# Patient Record
Sex: Female | Born: 1945 | Race: White | Hispanic: No | Marital: Married | Smoking: Never smoker
Health system: Southern US, Community
[De-identification: ages and names within clinical notes are randomized; demographics above are authoritative.]

## PROBLEM LIST (undated history)

## (undated) DIAGNOSIS — I517 Cardiomegaly: Secondary | ICD-10-CM

## (undated) DIAGNOSIS — Z9889 Other specified postprocedural states: Secondary | ICD-10-CM

## (undated) DIAGNOSIS — R252 Cramp and spasm: Secondary | ICD-10-CM

## (undated) DIAGNOSIS — K8020 Calculus of gallbladder without cholecystitis without obstruction: Secondary | ICD-10-CM

## (undated) DIAGNOSIS — F419 Anxiety disorder, unspecified: Secondary | ICD-10-CM

## (undated) DIAGNOSIS — K219 Gastro-esophageal reflux disease without esophagitis: Secondary | ICD-10-CM

## (undated) DIAGNOSIS — E785 Hyperlipidemia, unspecified: Secondary | ICD-10-CM

## (undated) DIAGNOSIS — R29898 Other symptoms and signs involving the musculoskeletal system: Secondary | ICD-10-CM

## (undated) DIAGNOSIS — Z96659 Presence of unspecified artificial knee joint: Secondary | ICD-10-CM

## (undated) DIAGNOSIS — M1990 Unspecified osteoarthritis, unspecified site: Secondary | ICD-10-CM

## (undated) DIAGNOSIS — K449 Diaphragmatic hernia without obstruction or gangrene: Secondary | ICD-10-CM

## (undated) DIAGNOSIS — G8929 Other chronic pain: Secondary | ICD-10-CM

## (undated) DIAGNOSIS — M542 Cervicalgia: Secondary | ICD-10-CM

## (undated) DIAGNOSIS — H269 Unspecified cataract: Secondary | ICD-10-CM

## (undated) DIAGNOSIS — T07XXXA Unspecified multiple injuries, initial encounter: Secondary | ICD-10-CM

## (undated) DIAGNOSIS — I8390 Asymptomatic varicose veins of unspecified lower extremity: Secondary | ICD-10-CM

## (undated) DIAGNOSIS — M797 Fibromyalgia: Secondary | ICD-10-CM

## (undated) DIAGNOSIS — I10 Essential (primary) hypertension: Secondary | ICD-10-CM

## (undated) DIAGNOSIS — R739 Hyperglycemia, unspecified: Secondary | ICD-10-CM

## (undated) HISTORY — DX: Essential (primary) hypertension: I10

## (undated) HISTORY — DX: Cramp and spasm: R25.2

## (undated) HISTORY — DX: Cervicalgia: M54.2

## (undated) HISTORY — DX: Calculus of gallbladder without cholecystitis without obstruction: K80.20

## (undated) HISTORY — DX: Hyperlipidemia, unspecified: E78.5

## (undated) HISTORY — DX: Cardiomegaly: I51.7

## (undated) HISTORY — DX: Hyperglycemia, unspecified: R73.9

## (undated) HISTORY — PX: TONSILLECTOMY: SUR1361

## (undated) HISTORY — DX: Other specified postprocedural states: Z98.890

## (undated) HISTORY — DX: Gastro-esophageal reflux disease without esophagitis: K21.9

## (undated) HISTORY — DX: Fibromyalgia: M79.7

## (undated) HISTORY — DX: Presence of unspecified artificial knee joint: Z96.659

## (undated) HISTORY — DX: Diaphragmatic hernia without obstruction or gangrene: K44.9

## (undated) HISTORY — PX: SIGMOIDOSCOPY: SUR1295

## (undated) HISTORY — PX: TOTAL KNEE ARTHROPLASTY: SHX125

## (undated) HISTORY — DX: Other chronic pain: G89.29

---
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
## 1975-09-20 HISTORY — PX: ABDOMINAL HYSTERECTOMY: SHX81

## 1997-04-08 ENCOUNTER — Encounter: Payer: Self-pay | Admitting: Internal Medicine

## 1999-11-24 ENCOUNTER — Encounter: Admission: RE | Admit: 1999-11-24 | Discharge: 1999-11-24 | Payer: Self-pay | Admitting: Internal Medicine

## 1999-11-24 ENCOUNTER — Encounter: Payer: Self-pay | Admitting: Internal Medicine

## 2003-04-16 ENCOUNTER — Other Ambulatory Visit: Admission: RE | Admit: 2003-04-16 | Discharge: 2003-04-16 | Payer: Self-pay | Admitting: Obstetrics and Gynecology

## 2004-07-30 ENCOUNTER — Ambulatory Visit: Payer: Self-pay | Admitting: Internal Medicine

## 2004-09-19 HISTORY — PX: OTHER SURGICAL HISTORY: SHX169

## 2005-03-02 ENCOUNTER — Ambulatory Visit: Payer: Self-pay | Admitting: Internal Medicine

## 2005-03-08 ENCOUNTER — Encounter: Admission: RE | Admit: 2005-03-08 | Discharge: 2005-03-08 | Payer: Self-pay | Admitting: Internal Medicine

## 2005-04-28 ENCOUNTER — Inpatient Hospital Stay (HOSPITAL_COMMUNITY): Admission: RE | Admit: 2005-04-28 | Discharge: 2005-04-29 | Payer: Self-pay | Admitting: Obstetrics and Gynecology

## 2005-10-12 ENCOUNTER — Other Ambulatory Visit: Admission: RE | Admit: 2005-10-12 | Discharge: 2005-10-12 | Payer: Self-pay | Admitting: Obstetrics and Gynecology

## 2006-04-19 HISTORY — PX: CYSTOSTOMY W/ BLADDER BIOPSY: SHX1431

## 2006-05-18 ENCOUNTER — Ambulatory Visit (HOSPITAL_BASED_OUTPATIENT_CLINIC_OR_DEPARTMENT_OTHER): Admission: RE | Admit: 2006-05-18 | Discharge: 2006-05-18 | Payer: Self-pay | Admitting: Urology

## 2006-05-18 ENCOUNTER — Encounter (INDEPENDENT_AMBULATORY_CARE_PROVIDER_SITE_OTHER): Payer: Self-pay | Admitting: Specialist

## 2006-05-25 ENCOUNTER — Ambulatory Visit: Payer: Self-pay | Admitting: Internal Medicine

## 2006-05-25 ENCOUNTER — Inpatient Hospital Stay (HOSPITAL_COMMUNITY): Admission: AD | Admit: 2006-05-25 | Discharge: 2006-05-28 | Payer: Self-pay | Admitting: Internal Medicine

## 2006-06-05 ENCOUNTER — Ambulatory Visit: Payer: Self-pay | Admitting: Internal Medicine

## 2006-07-03 ENCOUNTER — Ambulatory Visit: Payer: Self-pay | Admitting: Internal Medicine

## 2006-11-29 ENCOUNTER — Ambulatory Visit: Payer: Self-pay | Admitting: Internal Medicine

## 2007-05-30 ENCOUNTER — Encounter (INDEPENDENT_AMBULATORY_CARE_PROVIDER_SITE_OTHER): Payer: Self-pay | Admitting: *Deleted

## 2007-07-18 ENCOUNTER — Ambulatory Visit: Payer: Self-pay | Admitting: Internal Medicine

## 2007-08-02 ENCOUNTER — Encounter: Payer: Self-pay | Admitting: Internal Medicine

## 2007-11-16 ENCOUNTER — Encounter: Payer: Self-pay | Admitting: Internal Medicine

## 2007-12-11 ENCOUNTER — Telehealth (INDEPENDENT_AMBULATORY_CARE_PROVIDER_SITE_OTHER): Payer: Self-pay | Admitting: *Deleted

## 2007-12-11 ENCOUNTER — Ambulatory Visit: Payer: Self-pay | Admitting: Internal Medicine

## 2007-12-11 ENCOUNTER — Observation Stay (HOSPITAL_COMMUNITY): Admission: EM | Admit: 2007-12-11 | Discharge: 2007-12-12 | Payer: Self-pay | Admitting: Emergency Medicine

## 2007-12-21 ENCOUNTER — Ambulatory Visit: Payer: Self-pay | Admitting: Internal Medicine

## 2007-12-21 DIAGNOSIS — I959 Hypotension, unspecified: Secondary | ICD-10-CM | POA: Insufficient documentation

## 2007-12-21 DIAGNOSIS — R413 Other amnesia: Secondary | ICD-10-CM

## 2007-12-21 DIAGNOSIS — I10 Essential (primary) hypertension: Secondary | ICD-10-CM | POA: Insufficient documentation

## 2007-12-21 DIAGNOSIS — K219 Gastro-esophageal reflux disease without esophagitis: Secondary | ICD-10-CM

## 2007-12-21 DIAGNOSIS — K5909 Other constipation: Secondary | ICD-10-CM

## 2007-12-21 DIAGNOSIS — M797 Fibromyalgia: Secondary | ICD-10-CM

## 2007-12-21 DIAGNOSIS — E785 Hyperlipidemia, unspecified: Secondary | ICD-10-CM | POA: Insufficient documentation

## 2007-12-21 DIAGNOSIS — K449 Diaphragmatic hernia without obstruction or gangrene: Secondary | ICD-10-CM

## 2007-12-25 ENCOUNTER — Encounter: Payer: Self-pay | Admitting: Internal Medicine

## 2008-02-18 LAB — HM COLONOSCOPY

## 2008-03-05 DIAGNOSIS — N816 Rectocele: Secondary | ICD-10-CM | POA: Insufficient documentation

## 2008-03-05 DIAGNOSIS — R197 Diarrhea, unspecified: Secondary | ICD-10-CM

## 2008-03-05 DIAGNOSIS — M8580 Other specified disorders of bone density and structure, unspecified site: Secondary | ICD-10-CM

## 2008-03-06 ENCOUNTER — Ambulatory Visit: Payer: Self-pay | Admitting: Internal Medicine

## 2008-03-14 ENCOUNTER — Encounter: Payer: Self-pay | Admitting: Internal Medicine

## 2008-03-18 ENCOUNTER — Ambulatory Visit: Payer: Self-pay | Admitting: Internal Medicine

## 2008-06-06 ENCOUNTER — Encounter: Payer: Self-pay | Admitting: Internal Medicine

## 2008-06-18 ENCOUNTER — Ambulatory Visit: Payer: Self-pay | Admitting: Internal Medicine

## 2008-06-25 ENCOUNTER — Ambulatory Visit: Payer: Self-pay | Admitting: Internal Medicine

## 2008-10-10 ENCOUNTER — Encounter: Payer: Self-pay | Admitting: Internal Medicine

## 2008-12-08 ENCOUNTER — Ambulatory Visit: Payer: Self-pay | Admitting: Internal Medicine

## 2009-03-26 ENCOUNTER — Ambulatory Visit: Payer: Self-pay | Admitting: Internal Medicine

## 2009-03-26 DIAGNOSIS — J069 Acute upper respiratory infection, unspecified: Secondary | ICD-10-CM | POA: Insufficient documentation

## 2009-03-31 ENCOUNTER — Telehealth (INDEPENDENT_AMBULATORY_CARE_PROVIDER_SITE_OTHER): Payer: Self-pay | Admitting: *Deleted

## 2009-04-17 ENCOUNTER — Encounter: Payer: Self-pay | Admitting: Internal Medicine

## 2009-05-15 ENCOUNTER — Encounter: Payer: Self-pay | Admitting: Internal Medicine

## 2009-06-01 ENCOUNTER — Encounter: Payer: Self-pay | Admitting: Internal Medicine

## 2009-07-17 ENCOUNTER — Encounter: Payer: Self-pay | Admitting: Internal Medicine

## 2009-09-10 ENCOUNTER — Ambulatory Visit: Payer: Self-pay | Admitting: Internal Medicine

## 2009-09-10 ENCOUNTER — Telehealth: Payer: Self-pay | Admitting: Internal Medicine

## 2009-09-10 DIAGNOSIS — N390 Urinary tract infection, site not specified: Secondary | ICD-10-CM

## 2009-09-10 LAB — CONVERTED CEMR LAB
Bilirubin Urine: NEGATIVE
Glucose, Urine, Semiquant: NEGATIVE
Protein, U semiquant: 30
Urobilinogen, UA: 0.2
pH: 6

## 2009-09-15 ENCOUNTER — Encounter (INDEPENDENT_AMBULATORY_CARE_PROVIDER_SITE_OTHER): Payer: Self-pay | Admitting: *Deleted

## 2009-09-19 HISTORY — PX: COLONOSCOPY: SHX174

## 2009-11-06 ENCOUNTER — Encounter: Payer: Self-pay | Admitting: Internal Medicine

## 2010-03-05 ENCOUNTER — Encounter: Payer: Self-pay | Admitting: Internal Medicine

## 2010-07-16 ENCOUNTER — Encounter: Payer: Self-pay | Admitting: Internal Medicine

## 2010-10-19 NOTE — Letter (Signed)
Summary: External Correspondence/[facility]  External Correspondence/[facility]   Imported By: Freddy Jaksch 06/07/2007 10:55:14  _____________________________________________________________________  External Attachment:    Type:   Image     Comment:   External Document

## 2010-10-19 NOTE — Letter (Signed)
Summary: [facility]  [facility]   Imported By: Lanelle Bal 12/10/2009 12:20:30  _____________________________________________________________________  External Attachment:    Type:   Image     Comment:   External Document

## 2010-10-19 NOTE — Consult Note (Signed)
Summary: [facility]  [facility]   Imported By: Lanelle Bal 03/23/2010 08:23:48  _____________________________________________________________________  External Attachment:    Type:   Image     Comment:   External Document

## 2010-10-22 NOTE — Consult Note (Signed)
Summary: [facility]  [facility]   Imported By: Lanelle Bal 08/10/2010 09:33:37  _____________________________________________________________________  External Attachment:    Type:   Image     Comment:   External Document

## 2010-11-12 ENCOUNTER — Encounter: Payer: Self-pay | Admitting: Internal Medicine

## 2010-11-30 NOTE — Letter (Signed)
Summary: [facility]  [facility]   Imported By: Maryln Gottron 11/23/2010 15:51:02  _____________________________________________________________________  External Attachment:    Type:   Image     Comment:   External Document

## 2011-02-01 NOTE — Discharge Summary (Signed)
NAMEFLORIDA, NOLTON               ACCOUNT NO.:  [id number]   MEDICAL RECORD NO.:  [id number]          PATIENT TYPE:  INP   LOCATION:  5526                         FACILITY:  [facility]   PHYSICIAN:  Valerie A. Felicity Coyer, MDDATE OF BIRTH:  01-05-1946   DATE OF ADMISSION:  12/11/2007  DATE OF DISCHARGE:  12/12/2007                               DISCHARGE SUMMARY   DISCHARGE DIAGNOSES:  1. Diarrhea secondary to acute viral gastroenteritis versus      Clostridium difficile colitis.  2. Syncope with fall in the setting of dehydration and weakness      secondary to diarrhea.  3. Fibromyalgia.  4. Gastroesophageal reflux disease.  5. Hypertension with low blood pressure on admission.  6. Rule out urinary tract infection.  7. Hyperlipidemia.  8. Osteoporosis.   HISTORY OF PRESENT ILLNESS:  Ms. Osmundson is a 65 year old female  admitted on December 11, 2007, with a chief complaint of diarrhea and  multiple falls on the morning of this admission.  She noted that her  diarrhea started at 6 a.m. and upon standing in the bathroom, she had a  syncopal episode and thinks that she hit her head.  She denied any  nausea, vomiting, or abdominal pain.  She noted a total of 5 syncopal  episodes, none of which were witnessed, but did note that she struck her  head, that she had some bruising on her forehead and epistaxis.  These  falls occurred while she was trying to get out of bed to make it to the  bathroom secondary to diarrhea.  She was brought to the emergency  department by her daughter-in-law.  The patient also noted that she  recently underwent oral surgery on November 23, 2007, and was treated with a  7-day course of amoxicillin following that procedure.  She was admitted  for further evaluation and treatment.   PAST MEDICAL HISTORY:  1. Fibromyalgia.  2. GERD.  3. Hypertension.  4. Osteoporosis.  5. Hyperlipidemia.  6. History of bladder sling/prolapse repair in 2006.  7. Bladder biopsy 2007.  8. Oral surgery November 23, 2007, for tooth extraction of 2 upper molars      by Dr. Gwendlyn Deutscher.   COURSE OF HOSPITALIZATION:  1. Diarrhea, secondary to acute viral gastroenteritis versus C. diff      colitis.  The patient was noted to have a white count of 23,000      upon admission.  She was started empirically on Flagyl in the      setting of recent antibiotic use.  She has had no further stools      since coming to the emergency room yesterday, and therefore we have      been unable to send stool for C. diff toxin.  Her white count today      is 11.8, and she is feeling much better.  She is tolerating p.o.      Her blood pressure is stable at 128/83.  She has been ambulating in      the room without difficulty.  We will ask the RN to assist the  patient with ambulation in the hall and if stable, plan for      discharge later today.  We will continue empiric Flagyl for a total      of 7 days.  2. Syncope with fall.  The patient has had no further lightheadedness      or weakness noted.  CT of the head was performed on admission which      was negative.  CT of the maxillofacial sinus noted a retention cyst      at the right base of the maxillary sinus but no acute findings.      Thoracic spine x-ray was negative and chest x-ray was negative.      She also had point-of-care cardiac enzymes performed on admission      which were negative.  EKG on admission noted normal sinus rhythm,      and her rate has been stable.  The patient was given IV hydration,      and her blood pressure is currently stable.  3. Rule out urinary tract infection.  The patient was noted to have a      mildly positive urinalysis.  She has no urinary symptoms.  We will      send the urine for culture.  She will need followup appointment      with Dr. Alwyn Ren.   DISCHARGE MEDICATIONS:  1. Flagyl 500 mg p.o. q.8 h. x7 days.  2. Nexium 40 mg p.o. daily.  3. Mobic 7.5 mg p.o. daily.  4. Tramadol 50 mg 2 tabs  p.o. as needed.  5. Cymbalta 20 mg p.o. b.i.d.  6. Centrum Silver once daily.  7. Enteric-coated aspirin 81 mg p.o. daily.  8. Zanaflex 4 mg at bedtime.  9. Diltiazem 180 mg p.o. daily.  10.Os-Cal 1200 mg p.o. daily.  11.Vitamin D as before.  12.Lyrica 75 mg p.o. daily in the evening.  13.Pravachol 40 mg p.o. daily.  14.Hydrocortone/APAP 5/500 one tab p.o. q.4 h. as needed.  15.Flexeril 20 mg at bedtime as before.  16.Fosinopril 10 mg p.o. daily.   PERTINENT LABORATORY:  At the time of discharge, white blood cell count  11.8, hemoglobin 11.7, hematocrit 34, platelets 210, BUN 14, creatinine  0.82.   DISPOSITION:  The patient will be discharged to home.   FOLLOWUP:  The patient is scheduled to follow up with Dr. Marga Melnick  on Wednesday December 19, 2007, at 10 a.m.  She will need followup of urine  culture results with this appointment.      Sandford Craze, NP      Raenette Rover. Felicity Coyer, MD  Electronically Signed    MO/MEDQ  D:  12/12/2007  T:  12/13/2007  Job:  [id number]   cc:   Titus Dubin. Alwyn Ren, MD,FACP,FCCP

## 2011-02-04 NOTE — Op Note (Signed)
Madeline [name], Madeline [name]               ACCOUNT NO.:  [id number]   MEDICAL RECORD NO.:  [id number]          PATIENT TYPE:  AMB   LOCATION:  [facility]                         FACILITY:  [facility]   PHYSICIAN:  Excell Seltzer. Annabell Howells, M.D.    DATE OF BIRTH:  March 20, 1946   DATE OF PROCEDURE:  05/18/2006  DATE OF DISCHARGE:                                 OPERATIVE REPORT   PROCEDURE:  Cystoscopy, bladder biopsy, fulguration.   PREOPERATIVE DIAGNOSIS:  Bladder wall lesion.   POSTOPERATIVE DIAGNOSIS:  Bladder wall lesion.   SURGEON:  Excell Seltzer. Annabell Howells, M.D.   ANESTHESIA:  General.   SPECIMEN:  Bladder biopsies from the right lateral wall, posterior wall and  dome.   COMPLICATIONS:  None.   INDICATIONS:  [name] is a 65 year old white female who is status post  AP repair and sling in August of last year. She has had some increased  achiness in the suprapubic area with a sensation of incomplete emptying over  the last few months, she has had increased urgency with frequent small voids  more recently. Urinalysis revealed microhematuria.  Office cystoscopy  revealed some increased vascularity with velvety lesions on the posterior  wall suspicious for inflammation versus carcinoma in situ.  It was felt the  biopsy was indicated.   FINDINGS AND PROCEDURE:  The patient was taken operating room. After  receiving Cipro, a general anesthetic was induced.  She was placed in  lithotomy position.  Her perineum and genitalia were prepped with Betadine  solution.  She was draped in the usual sterile fashion.  Cystoscopy was  performed using a 22-French scope and 12 and 70 degree lenses.  Examination  revealed a normal urethra.  The bladder wall was pale and smooth with some  slightly pinkish tan areas on the posterior wall, it was not nearly as  impressive as it had been in the office, but there were areas  distinguishable difference from the normal mucosa.  The ureteral orifices  were unremarkable.   Cup biopsies  were obtained from the areas of concern in the right lateral  wall, posterior wall, and dome of the bladder.  The biopsy sites were  fulgurated with Bugbee electrode.  Once hemostasis was assured, the bladder  was drained.  The patient was taken down from the lithotomy position.  Her  anesthetic was reversed.  She was admitted to the recovery room in stable  condition.  There were no complications.      Excell Seltzer. Annabell Howells, M.D.  Electronically Signed     JJW/MEDQ  D:  05/18/2006  T:  05/18/2006  Job:  [id number]   cc:   Marcelino Duster L. Vincente Poli, M.D.  Fax: [phone]

## 2011-02-04 NOTE — Discharge Summary (Signed)
Madeline [name], Madeline [name]               ACCOUNT NO.:  [id number]   MEDICAL RECORD NO.:  [id number]            PATIENT TYPE:   LOCATION:                                 FACILITY:   PHYSICIAN:  Bruce Rexene Edison. Swords, MD         DATE OF BIRTH:   DATE OF ADMISSION:  05/25/2006  DATE OF DISCHARGE:  05/28/2006                                 DISCHARGE SUMMARY   DISCHARGE DIAGNOSES:  1. Fever.  2. Rash.  3. Urinary tract infection.  4. Fibromyalgia followed by Dr. Concepcion Living at [facility].  5. Gastroesophageal reflux disease.  6. Hypertension.  7. Hyperlipidemia.  8. Osteoporosis.  9. Recent cystoscopy with bladder biopsy (May 18, 2006).   DISCHARGE MEDICATIONS:  1. Ceftin 250 mg p.o. b.i.d. for 5 days.  2. Prednisone 20 mg 2 tablets daily for 2 days then 1 tablet daily for 2      days, then 1/2 tablet daily for 2 days.  3. Nexium 40 mg p.o. daily.  4. Mobic 7.5 mg p.r.n.  5. Tramadol 150 mg p.o. b.i.d.  6. Cymbalta 20 mg b.i.d.  7. Flexeril 10 mg p.o. p.r.n.  8. Diltiazem XR 180 mg p.o. daily.  9. Lisinopril 10 mg p.o. daily.  10.Zanaflex 4 mg p.o. q.h.s.  11.Fosamax 70 mg weekly.  12.Os-Cal 1 p.o. b.i.d.  13.Pravachol 40 mg p.o. daily.  14.Vicodin p.r.n.  15.Pyridium p.r.n. (not taking since cystoscopy).   ALLERGIES:  IVP DYE.   HOSPITAL LABORATORIES:  CMET, at the time of discharge, potassium 3.3,  glucose 136, albumin 2.9, calcium 8.2.  CBC white count 14.3 (on steroids).  Hemoglobin 10.7, group A strep negative.  Urine culture significant for  greater than 100,000 colonies E. coli sensitive to cephalosporins.  Group A  strep negative.  Blood cultures negative to date.   HOSPITAL CONSULTS:  Infectious disease and urology.   HOSPITAL PROCEDURES:  None.   CONDITION ON DISCHARGE:  Improved.   FOLLOWUP PLANS:  1. Dr. Elijah Birk as scheduled for 5 days from now.  2. Dr. Alwyn Ren in 1-2 weeks.   HOSPITAL COURSE:  The patient presented to the hospital  with fever and rash  on 05/25/2006.  Etiology unclear.  The patient was seen in consultation by  urology.  Urine culture was pending.  Did not grow anything until results  were presented on May 28, 2006 with greater than 100,000 colonies.  She  was empirically treated with antibiotics including ceftriaxone.  Antibiotics  will be changed to Ceftin as an outpatient.  She will followup with Dr.  Alwyn Ren.   Rash--I suspect that the rash was an idiopathic response related to the  urinary tract infection/pyelonephritis.  Rash resolved.  It did require a  dose of methylprednisolone and I will continue prednisone taper as an  outpatient.  The patient has been afebrile for greater than 24 hours.   Other medical problems are stable; and outlined as above.  She has followup  of fibromyalgia in 5 days with Dr. Concepcion Living.  Bruce Rexene Edison Swords, MD  Electronically Signed     BHS/MEDQ  D:  05/28/2006  T:  05/28/2006  Job:  [id number]   cc:   FAX #: [phone] Dr. Bing Neighbors. Alwyn Ren, MD,FACP,FCCP

## 2011-02-04 NOTE — Op Note (Signed)
[name], Madeline [name]               ACCOUNT NO.:  [id number]   MEDICAL RECORD NO.:  [id number]          PATIENT TYPE:  INP   LOCATION:  9310                          FACILITY:  [facility]   PHYSICIAN:  Michelle L. Grewal, M.D.DATE OF BIRTH:  [birth year]   DATE OF PROCEDURE:  04/28/2005  DATE OF DISCHARGE:                                 OPERATIVE REPORT   PREOPERATIVE DIAGNOSES:  1.  Pelvic pain.  2.  Cystocele.  3.  Rectocele.   POSTOPERATIVE DIAGNOSES:  1.  Pelvic pain.  2.  Cystocele.  3.  Rectocele.   PROCEDURES:  1.  Diagnostic laparoscopy.  2.  Anterior and posterior repair.   SURGEON:  Michelle L. Vincente Poli, M.D.   ASSISTANT:  Excell Seltzer. Annabell Howells, M.D.   ANESTHESIA:  General.   SPECIMENS:  None.   ESTIMATED BLOOD LOSS:  100 mL.   COMPLICATIONS:  None.   PROCEDURE:  The patient is taken to the operating room.  She is intubated  without difficulty.  She is then prepped and draped in the usual sterile  fashion.  An in-and-out catheter is used to empty the bladder.  Exam under  anesthesia revealed a grade 2-3 cystocele and a grade 2 rectocele.  The  attention was turned now abdominally, and an infraumbilical incision was  made with a scalpel, a Veress needle was inserted, and pneumoperitoneum was  achieved.  The patient was placed in Trendelenburg position and then with  excellent visualization of the pelvis and with a sponge stick in the vagina,  there were no adhesions noted in the pelvic floor and ovaries appeared to be  normal, and there were no adhesions or any evidence of endometriosis.  The  bowel surfaces appeared normal, too.  We then removed the laparoscope and  released as much pneumoperitoneum as possible and then closed the incision  using Dermabond.  At this point we then returned vaginally.  The weighted  speculum was placed in the vagina.  Allis clamps were placed in each vaginal  apex and a transverse incision was made with a scalpel, and a midline  incision was  made beneath the cystocele.  We then reduced the cystocele with  sharp and blunt dissection and I closed it with a pursestring suture using 0  Vicryl suture and then underneath that placed  series of interrupteds using  0 Vicryl suture.  The redundant vaginal epithelium was closed and that  incision was closed in a continuous running locked stitch using a 2-0 Vicryl  suture.  At this point Dr. Annabell Howells and I switched and Dr. Annabell Howells performed his  transobturator sling without incident.  After he closed that incision, I  then returned back as the primary surgeon and placed Allis clamps at the 5  and 7 o'clock position in the perineum, made a V-shaped incision of the  perineal body, and then made a midline incision all the way up the posterior  wall of the vagina and reduced the rectocele using sharp and blunt  dissection.  We then placed interrupteds using 0 Vicryl suture, moving  distally to proximal.  I  trimmed off the redundant vaginal epithelium and  then closed that using 2-0 Vicryl in continuous running locked stitch.  A  packing with Estrace cream was inserted into the vagina.  All sponge, lap  and instrument counts were correct x2.  The patient tolerated the procedure  well and went to the recovery room in stable condition.       MLG/MEDQ  D:  04/28/2005  T:  04/28/2005  Job:  [id number]   cc:   Excell Seltzer. Annabell Howells, M.D.  [address]  [place]  [place] [address]  Fax: [phone]

## 2011-02-04 NOTE — Op Note (Signed)
Madeline [name], Madeline [name]               ACCOUNT NO.:  [id number]   MEDICAL RECORD NO.:  [id number]          PATIENT TYPE:  INP   LOCATION:  9310                          FACILITY[facility]  [facility]   PHYSICIAN:  Excell Seltzer. Annabell Howells, M.D.    DATE OF BIRTH:  03-16-1946   DATE OF PROCEDURE:  04/27/2005  DATE OF DISCHARGE:                                 OPERATIVE REPORT   PROCEDURE:  Monarc transobturator sling.   PREOPERATIVE DIAGNOSIS:  Stress incontinence.   POSTOPERATIVE DIAGNOSIS:  Stress incontinence.   SURGEON:  Excell Seltzer. Annabell Howells, M.D.   ASSISTANT:  Stann Mainland. Vincente Poli, M.D.   ANESTHESIA:  General.   DRAINS:  Foley catheter.   COMPLICATIONS:  None.   INDICATIONS:  [name] is a 65 year old white female with a cystocele  and rectocele, who is to undergo laparoscopy followed by anterior-posterior  repair.  She has stress incontinence and is to undergo a transobturator  sling.  I elected that approach because of some chronic suprapubic pain.   FINDINGS AND PROCEDURE:  The patient had been placed under a general  anesthetic, prepped and placed in the lithotomy position and draped in the  usual sterile fashion.  Dr. Vincente Poli had completed the laparoscopy and  anterior repair.   I then infiltrated the anterior vaginal wall over the midurethra with  approximately 4 mL of 1% lidocaine with epinephrine.  A midline incision was  made.  The vaginal mucosa was elevated off the pubourethral fascia on each  side, allowing placement of a fingertip into the incision.  Incision sites  were then marked 5 cm lateral to the clitoris on each side.  The location of  the adductor longus tendon was identified and the incision was placed well  below this to avoid involvement.  Both incisions were then made  approximately a centimeter in length and the fat was spread slightly with a  hemostat.  A C needle was then passed on the left without difficulty into  the vaginal area and then out through the midurethral  incision.  Inspection  revealed no evidence of perforation of the vaginal vault.  The right needle  was then passed in identical fashion.   Once both needles were passed, cystoscopy was performed using a 21 [place]  scope and 70 degree lens.  Examination revealed no evidence of bladder wall  or urethral injury.   The Monarc mesh was then secured to the needles and moved into position.  The tension of the mesh was adjusted and the sheaths were removed.   The Foley catheter was replaced and the tension of the mesh was once again  adjusted.  Once it was felt to be appropriate, the vaginal wall was closed  with a running locked 2-0 Vicryl suture.  The ends of the mesh were then  trimmed from the perilabral incisions and the perilabral incisions were  cleaned, dried and closed with Dermabond.   At this point Dr. Vincente Poli performed the rectocele.  The Foley catheter was  left to straight drainage and a vaginal pack was placed at the end of the  procedure.  There were no complications during my portion of the operation.      Excell Seltzer. Annabell Howells, M.D.  Electronically Signed     JJW/MEDQ  D:  04/28/2005  T:  04/28/2005  Job:  [id number]   cc:   Marcelino Duster L. Vincente Poli, M.D.  [address] [place]  [place] [address]  Fax: [phone]

## 2011-06-13 LAB — URINE CULTURE: Colony Count: 15000

## 2011-06-13 LAB — COMPREHENSIVE METABOLIC PANEL
ALT: 17
AST: 32
Albumin: 4.2
Alkaline Phosphatase: 72
BUN: 21
CO2: 25
Calcium: 9.8
Chloride: 104
Creatinine, Ser: 1.05
GFR calc Af Amer: >60
GFR calc non Af Amer: 53 — ABNORMAL LOW
Glucose, Bld: 114 — ABNORMAL HIGH
Potassium: 4
Sodium: 141
Total Bilirubin: 0.7
Total Protein: 7.5

## 2011-06-13 LAB — URINALYSIS, ROUTINE W REFLEX MICROSCOPIC
Glucose, UA: NEGATIVE
Ketones, ur: 15 — AB
Nitrite: NEGATIVE
Protein, ur: NEGATIVE
Specific Gravity, Urine: 1.013
Urobilinogen, UA: 1
pH: 5.5

## 2011-06-13 LAB — URINE MICROSCOPIC-ADD ON

## 2011-06-13 LAB — CBC
HCT: 42.8
Hemoglobin: 14.6
MCHC: 34
MCV: 89
MCV: 89.1
Platelets: 243
RBC: 3.82 — ABNORMAL LOW
RBC: 4.81
RDW: 13.4
WBC: 11.8 — ABNORMAL HIGH
WBC: 23.3 — ABNORMAL HIGH

## 2011-06-13 LAB — POCT CARDIAC MARKERS
Myoglobin, poc: 67.6
Operator id: 285491

## 2011-06-13 LAB — DIFFERENTIAL
Basophils Absolute: 0
Basophils Relative: 0
Eosinophils Absolute: 0
Eosinophils Relative: 0
Lymphocytes Relative: 17
Lymphs Abs: 4
Monocytes Absolute: 1.6 — ABNORMAL HIGH
Monocytes Relative: 7
Neutro Abs: 17.7 — ABNORMAL HIGH
Neutrophils Relative %: 76

## 2011-06-13 LAB — BASIC METABOLIC PANEL
Chloride: 103
Creatinine, Ser: 0.82
GFR calc Af Amer: >60

## 2011-06-30 ENCOUNTER — Encounter: Payer: Self-pay | Admitting: Internal Medicine

## 2011-06-30 ENCOUNTER — Ambulatory Visit (INDEPENDENT_AMBULATORY_CARE_PROVIDER_SITE_OTHER): Payer: Medicare Other | Admitting: Internal Medicine

## 2011-06-30 VITALS — BP 126/84 | HR 103 | Temp 98.4°F | Resp 12 | Ht 67.75 in | Wt 186.8 lb

## 2011-06-30 DIAGNOSIS — E785 Hyperlipidemia, unspecified: Secondary | ICD-10-CM

## 2011-06-30 DIAGNOSIS — Z23 Encounter for immunization: Secondary | ICD-10-CM

## 2011-06-30 DIAGNOSIS — Z0000 Encounter for general adult medical examination without abnormal findings: Secondary | ICD-10-CM

## 2011-06-30 NOTE — Progress Notes (Signed)
Subjective:    Patient ID: Madeline [name], female    DOB: 04-21-1946, 65 y.o.   MRN: [id number]  HPI Pre op evaluation: Surgical diagnosis:end stage Osteoarthritis Tentative surgical date/Surgeon:09/21/2011; Dr Despina Hick Severity: up to 7 ; knife like with ambulation Activity of daily living limitation/impairment of function:using cane for gait stability with  ambulation Treatment to date, efficacy:Mobic; Tramadol; S/P steroid injection X 4 Significant past medical history:  dyslipidemia; hypertension; fibromyalgia; Osteopenia; vitamin D deficiency   Review of Systems HYPERTENSION: Disease Monitoring: Blood pressure range-125/80-85  Chest pain, palpitations- no       Dyspnea-with stairs due to sedentary lifestyle Medications: Compliance- yes  Lightheadedness,Syncope- no    Edema- intermittently  FASTING HYPERGLYCEMIA:FBS 114 in 2009 Polyuria/phagia/dipsia- no       Visual problems- occasional blurring due to cataracts   HYPERLIPIDEMIA: Medications: Compliance- yes  Abd pain, bowel changes- no   Muscle aches- from Fibromyalgia  She has noted some memory issues; this was evaluated by Dr Sandria Manly.          Objective:   Physical Exam Gen.: Healthy and well-nourished in appearance. Alert, appropriate and cooperative throughout exam. Head: Normocephalic without obvious abnormalities  Eyes: No corneal or conjunctival inflammation noted. Pupils equal round reactive to light and accommodation.  Extraocular motion intact.  Ears: External  ear exam reveals no significant lesions or deformities. Canals clear .TMs normal.  Nose: External nasal exam reveals no deformity or inflammation. Nasal mucosa are pink and moist. No lesions or exudates noted.  Mouth: Oral mucosa and oropharynx reveal no lesions or exudates. Teeth in good repair. Neck: No deformities, masses, or tenderness noted. Range of motion slightly decreased. Thyroid normal. Lungs: Normal respiratory effort; chest expands  symmetrically. Lungs are clear to auscultation without rales, wheezes, or increased work of breathing. Heart: Normal rate and rhythm. Normal S1 and S2. No gallop, click, or rub. S 4 w/o murmur. Abdomen: Bowel sounds normal; abdomen soft and nontender. No masses, organomegaly or hernias noted. No AAA                                                                                  Musculoskeletal/extremities: No deformity or scoliosis noted of  the thoracic or lumbar spine. No clubbing, cyanosis, edema noted. Joints ; minimal DIP OA changes. Nail health  good. Valgus deformity of the knees noted. There is marked crepitus of the knees bilaterally, left greater than right. Vascular: Carotid, radial artery, dorsalis pedis and  posterior tibial pulses are full and equal. No bruits present.(Note: Dr. Elmer Picker questioned a carotid bruit; I cannot auscultate such at this time) Neurologic: Alert and oriented x3. Deep tendon reflexes symmetrical and normal.          Skin: Intact without suspicious lesions or rashes. Lymph: No cervical, axillary  lymphadenopathy present. Psych: Mood and affect are normal. Normally interactive  Assessment & Plan:  #1 end-stage degenerative joint disease in the; no contraindications to surgery at this time.   #2 dyslipidemia with strong family history of coronary disease. Clarification of risk is indicated  Plan: See orders and recommendations.

## 2011-06-30 NOTE — Patient Instructions (Signed)
Please  schedule fasting Labs : BMET, hepatic panel, CBC & dif, TSH, & NMR Lipoprofile (see Diagnoses for Codes). Please bring these instructions to that Lab appt.

## 2011-07-01 ENCOUNTER — Other Ambulatory Visit (INDEPENDENT_AMBULATORY_CARE_PROVIDER_SITE_OTHER): Payer: Medicare Other

## 2011-07-01 ENCOUNTER — Other Ambulatory Visit: Payer: Self-pay | Admitting: Internal Medicine

## 2011-07-01 DIAGNOSIS — E785 Hyperlipidemia, unspecified: Secondary | ICD-10-CM

## 2011-07-01 DIAGNOSIS — Z0000 Encounter for general adult medical examination without abnormal findings: Secondary | ICD-10-CM

## 2011-07-01 LAB — CBC WITH DIFFERENTIAL/PLATELET
Eosinophils Relative: 1.4 % (ref 0.0–5.0)
HCT: 37.6 % (ref 36.0–46.0)
Hemoglobin: 12.4 g/dL (ref 12.0–15.0)
Lymphs Abs: 1.9 10*3/uL (ref 0.7–4.0)
Monocytes Relative: 7.3 % (ref 3.0–12.0)
Neutro Abs: 6.2 10*3/uL (ref 1.4–7.7)
RBC: 4.06 Mil/uL (ref 3.87–5.11)
WBC: 8.9 10*3/uL (ref 4.5–10.5)

## 2011-07-01 LAB — HEPATIC FUNCTION PANEL: Albumin: 4.4 g/dL (ref 3.5–5.2)

## 2011-07-01 NOTE — Progress Notes (Signed)
Labs only

## 2011-07-10 ENCOUNTER — Encounter: Payer: Self-pay | Admitting: Internal Medicine

## 2011-07-10 NOTE — Progress Notes (Signed)
  Subjective:    Patient ID: Madeline [name], female    DOB: 07-06-1946, 65 y.o.   MRN: [id number]  HPI    Review of Systems     Objective:   Physical Exam        Assessment & Plan:  Labs from Dr Vincente Poli, [place] [facility] 05/30/11 reviewed:all normal except for minmal elevation of BUN(27). NMR Lipoprofile 07/01/11 reviewed; LDL is 116, goal = < 125. HDL protective @ 71. No medical contraindication to planned surgery by Dr Despina Hick 09/21/2011.

## 2011-08-30 ENCOUNTER — Other Ambulatory Visit: Payer: Self-pay | Admitting: Orthopedic Surgery

## 2011-08-30 NOTE — H&P (Signed)
Madeline [name]  DOB: 07-04-1946  Date of Admission:  09/21/2011  Chief Complaint:  Left Knee Pain  History of Present Illness The patient is a 65 year old female who comes in today for a preoperative History and Physical. The patient is scheduled for a left total knee arthroplasty to be performed by Dr. Gus Rankin. Aluisio, MD at [facility] on 09/21/2011. They have been treated conservatively in the past for the above stated problem and despite conservative measures, they continue to have progressive pain and severe functional limitations and dysfunction. They have failed non-operative management including home exercise, medications, and injections. It is felt that they would benefit from undergoing total joint replacement. Risks and benefits of the procedure have been discussed with the patient and they elect to proceed with surgery. There are no active contraindications to surgery such as ongoing infection or rapidly progressive neurological disease.  Problem List/Past Medical Tinnitus Glaucoma Cataract Chronic Bronchitis Varicose veins Hemorrhoids Urinary Tract Infection Osteopenia Measles Mumps Streptococcus Infections. Strep Throat Fibromyalgia Gastroesophageal Reflux Disease Heart murmur High blood pressure Hypercholesterolemia Migraine Headache Osteoarthritis  Allergies IVP DYE. 11/22/2005  Family History Heart Disease. grandmother mothers side, grandfather mothers side, grandmother fathers side and grandfather fathers side Heart disease in female family member before age 44 Hypertension. father Osteoarthritis. mother, father and grandmother mothers side Osteoporosis. mother Father. Deceased. Age 9,Aneurysm of Aorta and Femoral Art., Histiry of dissection(aortic). Mother. Living. Age 74  Social History Alcohol use. current drinker; drinks wine; only occasionally per week Children. 2 Current work status. retired Financial planner  (Currently). no Drug/Alcohol Rehab (Previously). no Exercise. Exercises never Illicit drug use. no Living situation. live with spouse Marital status. married Number of flights of stairs before winded. 2-3 Pain Contract. no Tobacco / smoke exposure. no Tobacco use. Never smoker. never smoker Post-Surgical Plans. Plans for [facility] Advance Directives. Living Will  Medication History TiZANidine HCl (2MG  Tablet, Oral at bedtime) Active. Pravastatin Sodium (40MG  Tablet, Oral at bedtime) Active. NexIUM (40MG  Capsule DR, Oral every morning) Active. Meloxicam (7.5MG  Tablet, Oral every morning) Active. Lyrica (75MG  Capsule, Oral at bedtime) Active. Fosinopril Sodium (10MG  Tablet, Oral at bedtime) Active. Diltiazem HCl CR (180MG  Capsule ER 24HR, Oral at bedtime) Active. Cymbalta (20MG  Capsule DR Part, Oral two times daily) Active. Cyclobenzaprine HCl (10MG  Tablet, 2 Oral at bedtime) Active. Aspirin EC (81MG  Tablet DR, Oral at bedtime) Active. TraMADol HCl (50MG  Tablet, 1-2 Oral every six hours, as needed) Active. Hydrocodone-Acetaminophen (5-500MG  Tablet, Oral as needed) Active. Benadryl (25MG  Tablet, Oral as needed) Active. (Takes one with Hydrocodone for itching) Centrum Silver ( Oral daily) Active. Vitamin D (Ergocalciferol) (50000UNIT Capsule, Oral every 2 weeks) Active.  Past Surgical History Appendectomy. Date: 70. Dilation and Curettage of Uterus Hysterectomy. Date: 47. complete (non-cancerous) Sinus Surgery Tonsillectomy. Date: 63. A/P Repair and Bladder Sling Surgery. Date: 2003.  Review of Systems General:Present- Memory Loss. Not Present- Chills, Fever, Night Sweats, Appetite Loss, Fatigue, Feeling sick, Weight Gain and Weight Loss. HEENT:Present- Sensitivity to light, Blurred Vision (occassional) and Ringing in the Ears. Not Present- Hearing problems and Nose Bleed. Respiratory:Present- Shortness of breath with exertion. Not Present- Shortness  of breath at rest, Chronic Cough and Wheezing. Cardiovascular:Present- Swelling of Extremities and Leg Cramps. Not Present- Shortness of Breath, Chest Pain and Palpitations. Gastrointestinal:Present- Heartburn and Difficulty Swallowing (sometimes). Not Present- Bloody Stool, Abdominal Pain, Vomiting, Nausea and Incontinence of Stool. Female Genitourinary:Present- Nocturia. Not Present- Blood in Urine, Menstrual Irregularities, Dysuria, Frequency, Hematuria and Incontinence. Musculoskeletal:Present- Joint  Stiffness, Joint Swelling and Joint Pain. Not Present- Muscle Weakness, Muscle Pain and Back Pain. Neurological:Present- Tingling, Numbness and Difficulty with balance (uses cane for balance). Not Present- Burning, Tremor, Headaches and Dizziness. Psychiatric:Present- Memory Loss. Not Present- Anxiety and Depression.  Vitals Weight: 184 lb Height: 67.5 in Body Surface Area: 1.99 m Body Mass Index: 28.39 kg/m Pulse: 88 (Regular) Resp.: 12 (Unlabored) BP: 122/76 (Sitting, Right Arm, Standard)  Physical Exam The physical exam findings are as follows: Patient is a 65 year old white female with continued left knee pain.  General Mental Status - Alert, cooperative and good historian. General Appearance- pleasant. Not in acute distress. Orientation- Oriented X3. Build & Nutrition- Well nourished and Well developed.  Head and Neck Head- normocephalic, atraumatic . Neck Global Assessment- supple. no bruit auscultated on the right and no bruit auscultated on the left.  Eye Pupil- Bilateral- Regular and Round. Motion- Bilateral- EOMI. Wears Glasses  Chest and Lung Exam Auscultation: Breath sounds:- clear at anterior chest wall and - clear at posterior chest wall. Adventitious sounds:- No Adventitious sounds.  Cardiovascular Auscultation:Rhythm- Regular rate and rhythm. Heart Sounds- S1 WNL and S2 WNL. Murmurs & Other Heart Sounds:Auscultation of  the heart reveals - No Murmurs.  Abdomen Palpation/Percussion:Tenderness- Abdomen is non-tender to palpation. Rigidity (guarding)- Abdomen is soft. Auscultation:Auscultation of the abdomen reveals - Bowel sounds normal.  Female Genitourinary Not done, not pertinent to present illness  Musculoskeletal Both hips with normal range of motion, no discomfort. Both knees show slight varus. Left knee range 5 to 120, right knee range about 5 to 125. There is marked crepitus on range of motion of both knees. She is tender medial greater than lateral. There is no instability in either knee. Pulse, sensation, and motor are intact.  RADIOGRAPHS: AP of both knees and lateral showing advanced end stage arthritis of both knees, bone on bone medial and patellofemoral compartment of both knees. The left is a little worse than the right.  Assessment & Plan Osteoarthrosis Left Knee  Patient is for a Left Total Knee Replacement per Dr. Lequita Halt.  Patient wants to look into [facility] after hospital stay.  Avel Peace, PA-C

## 2011-09-06 ENCOUNTER — Encounter (HOSPITAL_COMMUNITY): Payer: Self-pay | Admitting: Pharmacy Technician

## 2011-09-13 DIAGNOSIS — I10 Essential (primary) hypertension: Secondary | ICD-10-CM

## 2011-09-13 DIAGNOSIS — H269 Unspecified cataract: Secondary | ICD-10-CM

## 2011-09-13 DIAGNOSIS — R29898 Other symptoms and signs involving the musculoskeletal system: Secondary | ICD-10-CM

## 2011-09-13 HISTORY — DX: Other symptoms and signs involving the musculoskeletal system: R29.898

## 2011-09-13 HISTORY — DX: Unspecified cataract: H26.9

## 2011-09-13 HISTORY — DX: Essential (primary) hypertension: I10

## 2011-09-14 ENCOUNTER — Ambulatory Visit (HOSPITAL_COMMUNITY)
Admission: RE | Admit: 2011-09-14 | Discharge: 2011-09-14 | Disposition: A | Discharge status: Home / Self Care | Payer: Medicare Other | Source: Ambulatory Visit | Attending: Orthopedic Surgery | Admitting: Orthopedic Surgery

## 2011-09-14 ENCOUNTER — Encounter (HOSPITAL_COMMUNITY)
Admission: RE | Admit: 2011-09-14 | Discharge: 2011-09-14 | Disposition: A | Discharge status: Home / Self Care | Payer: Medicare Other | Source: Ambulatory Visit | Attending: Orthopedic Surgery | Admitting: Orthopedic Surgery

## 2011-09-14 ENCOUNTER — Encounter (HOSPITAL_COMMUNITY): Payer: Self-pay

## 2011-09-14 DIAGNOSIS — M1990 Unspecified osteoarthritis, unspecified site: Secondary | ICD-10-CM

## 2011-09-14 DIAGNOSIS — K219 Gastro-esophageal reflux disease without esophagitis: Secondary | ICD-10-CM

## 2011-09-14 DIAGNOSIS — Z01812 Encounter for preprocedural laboratory examination: Secondary | ICD-10-CM | POA: Insufficient documentation

## 2011-09-14 DIAGNOSIS — Z01818 Encounter for other preprocedural examination: Secondary | ICD-10-CM | POA: Insufficient documentation

## 2011-09-14 DIAGNOSIS — I8390 Asymptomatic varicose veins of unspecified lower extremity: Secondary | ICD-10-CM

## 2011-09-14 DIAGNOSIS — T07XXXA Unspecified multiple injuries, initial encounter: Secondary | ICD-10-CM

## 2011-09-14 DIAGNOSIS — M797 Fibromyalgia: Secondary | ICD-10-CM

## 2011-09-14 HISTORY — PX: NASAL SINUS SURGERY: SHX719

## 2011-09-14 HISTORY — DX: Unspecified osteoarthritis, unspecified site: M19.90

## 2011-09-14 HISTORY — DX: Asymptomatic varicose veins of unspecified lower extremity: I83.90

## 2011-09-14 HISTORY — DX: Other symptoms and signs involving the musculoskeletal system: R29.898

## 2011-09-14 HISTORY — DX: Unspecified multiple injuries, initial encounter: T07.XXXA

## 2011-09-14 HISTORY — DX: Gastro-esophageal reflux disease without esophagitis: K21.9

## 2011-09-14 HISTORY — DX: Fibromyalgia: M79.7

## 2011-09-14 HISTORY — DX: Unspecified cataract: H26.9

## 2011-09-14 LAB — PROTIME-INR
INR: 0.9 (ref 0.00–1.49)
Prothrombin Time: 12.3 seconds (ref 11.6–15.2)

## 2011-09-14 LAB — COMPREHENSIVE METABOLIC PANEL
Albumin: 3.9 g/dL (ref 3.5–5.2)
BUN: 29 mg/dL — ABNORMAL HIGH (ref 6–23)
Creatinine, Ser: 0.62 mg/dL (ref 0.50–1.10)
Total Bilirubin: 0.2 mg/dL — ABNORMAL LOW (ref 0.3–1.2)
Total Protein: 7.1 g/dL (ref 6.0–8.3)

## 2011-09-14 LAB — URINALYSIS, ROUTINE W REFLEX MICROSCOPIC
Bilirubin Urine: NEGATIVE
Ketones, ur: NEGATIVE mg/dL
Nitrite: NEGATIVE
pH: 6 (ref 5.0–8.0)

## 2011-09-14 LAB — CBC
HCT: 37.6 % (ref 36.0–46.0)
MCH: 29.2 pg (ref 26.0–34.0)
MCHC: 32.7 g/dL (ref 30.0–36.0)
MCV: 89.3 fL (ref 78.0–100.0)
RDW: 13.6 % (ref 11.5–15.5)

## 2011-09-14 LAB — ABO/RH: ABO/RH(D): A POS

## 2011-09-14 MED ORDER — CHLORHEXIDINE GLUCONATE 4 % EX LIQD: 60.0000 mL | Freq: Once | CUTANEOUS | Status: DC

## 2011-09-14 NOTE — Patient Instructions (Signed)
20 Madeline [name]  09/14/2011   Your procedure is scheduled on:  09-21-11  Report to [facility] at 0630 AM.  Call this number if you have problems the morning of surgery: [phone]   Remember:   Do not eat food:After Midnight.  May have clear liquids:until Midnight .  Clear liquids include soda, tea, black coffee, apple or grape juice, broth.  Take these medicines the morning of surgery with A SIP OF WATER:Cymbalta, Nexium   Do not wear jewelry, make-up or nail polish.  Do not wear lotions, powders, or perfumes. You may wear deodorant.  Do not shave 48 hours prior to surgery.  Do not bring valuables to the hospital.  Contacts, dentures or bridgework may not be worn into surgery.  Leave suitcase in the car. After surgery it may be brought to your room.  For patients admitted to the hospital, checkout time is 11:00 AM the day of discharge.   Patients discharged the day of surgery will not be allowed to drive home.  Name and phone number of your driver: spouse  Special Instructions: CHG Shower Use Special Wash: 1/2 bottle night before surgery and 1/2 bottle morning of surgery.   Please read over the following fact sheets that you were given: Blood Transfusion Information and MRSA Information, Incentive Spirometry Instruction.

## 2011-09-14 NOTE — Pre-Procedure Instructions (Addendum)
09-14-11 EKG( 07-01-11)-report with chart. Surgical clearance note with chart. CXR done today.

## 2011-09-20 MED ORDER — BUPIVACAINE 0.25 % ON-Q PUMP SINGLE CATH 300ML
300.0000 mL | INJECTION | Status: DC
  Administered 2011-09-21: 300 mL
  Filled 2011-09-20: qty 300

## 2011-09-21 ENCOUNTER — Inpatient Hospital Stay (HOSPITAL_COMMUNITY)
Admission: RE | Admit: 2011-09-21 | Discharge: 2011-09-24 | DRG: 470 | Disposition: A | Discharge status: Skilled Nursing Facility | Payer: Medicare Other | Source: Ambulatory Visit | Attending: Orthopedic Surgery | Admitting: Orthopedic Surgery

## 2011-09-21 ENCOUNTER — Other Ambulatory Visit: Payer: Self-pay | Admitting: Orthopedic Surgery

## 2011-09-21 ENCOUNTER — Encounter (HOSPITAL_COMMUNITY)
Admission: RE | Disposition: A | Discharge status: Skilled Nursing Facility | Payer: Self-pay | Source: Ambulatory Visit | Attending: Orthopedic Surgery

## 2011-09-21 ENCOUNTER — Inpatient Hospital Stay (HOSPITAL_COMMUNITY): Payer: Medicare Other | Admitting: Anesthesiology

## 2011-09-21 ENCOUNTER — Encounter (HOSPITAL_COMMUNITY): Payer: Self-pay | Admitting: *Deleted

## 2011-09-21 ENCOUNTER — Encounter (HOSPITAL_COMMUNITY): Payer: Self-pay | Admitting: Anesthesiology

## 2011-09-21 DIAGNOSIS — D62 Acute posthemorrhagic anemia: Secondary | ICD-10-CM | POA: Diagnosis not present

## 2011-09-21 DIAGNOSIS — K219 Gastro-esophageal reflux disease without esophagitis: Secondary | ICD-10-CM | POA: Diagnosis present

## 2011-09-21 DIAGNOSIS — Z96659 Presence of unspecified artificial knee joint: Secondary | ICD-10-CM

## 2011-09-21 DIAGNOSIS — IMO0001 Reserved for inherently not codable concepts without codable children: Secondary | ICD-10-CM | POA: Diagnosis present

## 2011-09-21 DIAGNOSIS — E871 Hypo-osmolality and hyponatremia: Secondary | ICD-10-CM | POA: Diagnosis not present

## 2011-09-21 DIAGNOSIS — M1710 Unilateral primary osteoarthritis, unspecified knee: Principal | ICD-10-CM | POA: Diagnosis present

## 2011-09-21 DIAGNOSIS — I10 Essential (primary) hypertension: Secondary | ICD-10-CM | POA: Diagnosis present

## 2011-09-21 LAB — TYPE AND SCREEN: Antibody Screen: NEGATIVE

## 2011-09-21 SURGERY — ARTHROPLASTY, KNEE, TOTAL
Anesthesia: General | Site: Knee | Laterality: Left | Wound class: Clean

## 2011-09-21 MED ORDER — ACETAMINOPHEN 325 MG PO TABS
650.0000 mg | ORAL_TABLET | Freq: Four times a day (QID) | ORAL | Status: DC | PRN
  Administered 2011-09-22 – 2011-09-23 (×2): 650 mg via ORAL
  Filled 2011-09-21 (×2): qty 2

## 2011-09-21 MED ORDER — MORPHINE SULFATE (PF) 1 MG/ML IV SOLN
INTRAVENOUS | Status: DC
  Administered 2011-09-21: 11:00:00 via INTRAVENOUS
  Administered 2011-09-21: 1 mg via INTRAVENOUS
  Administered 2011-09-21: via INTRAVENOUS
  Filled 2011-09-21 (×2): qty 25

## 2011-09-21 MED ORDER — NALOXONE HCL 0.4 MG/ML IJ SOLN: 0.4000 mg | INTRAMUSCULAR | Status: DC | PRN

## 2011-09-21 MED ORDER — POLYETHYLENE GLYCOL 3350 17 G PO PACK
17.0000 g | PACK | Freq: Every day | ORAL | Status: DC | PRN
  Filled 2011-09-21: qty 1

## 2011-09-21 MED ORDER — DILTIAZEM HCL ER 180 MG PO CP24
180.0000 mg | ORAL_CAPSULE | Freq: Every day | ORAL | Status: DC
  Administered 2011-09-21 – 2011-09-23 (×3): 180 mg via ORAL
  Filled 2011-09-21 (×4): qty 1

## 2011-09-21 MED ORDER — METHOCARBAMOL 100 MG/ML IJ SOLN
500.0000 mg | Freq: Four times a day (QID) | INTRAVENOUS | Status: DC | PRN
  Administered 2011-09-21: 500 mg via INTRAVENOUS
  Filled 2011-09-21 (×2): qty 5

## 2011-09-21 MED ORDER — RIVAROXABAN 10 MG PO TABS
10.0000 mg | ORAL_TABLET | Freq: Every day | ORAL | Status: DC
  Administered 2011-09-22 – 2011-09-24 (×3): 10 mg via ORAL
  Filled 2011-09-21 (×3): qty 1

## 2011-09-21 MED ORDER — ACETAMINOPHEN 10 MG/ML IV SOLN
INTRAVENOUS | Status: DC | PRN
  Administered 2011-09-21: 1000 mg via INTRAVENOUS

## 2011-09-21 MED ORDER — CEFAZOLIN SODIUM 1-5 GM-% IV SOLN
INTRAVENOUS | Status: DC | PRN
  Administered 2011-09-21: 2 g via INTRAVENOUS

## 2011-09-21 MED ORDER — DIPHENHYDRAMINE HCL 50 MG/ML IJ SOLN: 12.5000 mg | Freq: Four times a day (QID) | INTRAMUSCULAR | Status: DC | PRN

## 2011-09-21 MED ORDER — HYDROMORPHONE HCL PF 1 MG/ML IJ SOLN
0.2500 mg | INTRAMUSCULAR | Status: DC | PRN
  Administered 2011-09-21 (×4): 0.5 mg via INTRAVENOUS

## 2011-09-21 MED ORDER — TIZANIDINE HCL 4 MG PO TABS
4.0000 mg | ORAL_TABLET | Freq: Three times a day (TID) | ORAL | Status: DC | PRN
  Administered 2011-09-21: 4 mg via ORAL
  Filled 2011-09-21: qty 1

## 2011-09-21 MED ORDER — PHENOL 1.4 % MT LIQD: 1.0000 | OROMUCOSAL | Status: DC | PRN

## 2011-09-21 MED ORDER — MIDAZOLAM HCL 5 MG/5ML IJ SOLN
INTRAMUSCULAR | Status: DC | PRN
  Administered 2011-09-21: 2 mg via INTRAVENOUS

## 2011-09-21 MED ORDER — CEFAZOLIN SODIUM-DEXTROSE 2-3 GM-% IV SOLR: 2.0000 g | Freq: Once | INTRAVENOUS | Status: DC

## 2011-09-21 MED ORDER — METOCLOPRAMIDE HCL 10 MG PO TABS: 5.0000 mg | ORAL_TABLET | Freq: Three times a day (TID) | ORAL | Status: DC | PRN

## 2011-09-21 MED ORDER — DEXTROSE-NACL 5-0.9 % IV SOLN
INTRAVENOUS | Status: DC
  Administered 2011-09-21 – 2011-09-22 (×2): via INTRAVENOUS

## 2011-09-21 MED ORDER — SUCCINYLCHOLINE CHLORIDE 20 MG/ML IJ SOLN
INTRAMUSCULAR | Status: DC | PRN
  Administered 2011-09-21: 100 mg via INTRAVENOUS

## 2011-09-21 MED ORDER — OXYCODONE HCL 5 MG PO TABS
5.0000 mg | ORAL_TABLET | ORAL | Status: DC | PRN
  Administered 2011-09-21: 5 mg via ORAL
  Administered 2011-09-22 – 2011-09-24 (×10): 10 mg via ORAL
  Filled 2011-09-21 (×3): qty 2
  Filled 2011-09-21: qty 1
  Filled 2011-09-21 (×7): qty 2

## 2011-09-21 MED ORDER — DOCUSATE SODIUM 100 MG PO CAPS
100.0000 mg | ORAL_CAPSULE | Freq: Two times a day (BID) | ORAL | Status: DC
  Administered 2011-09-21 – 2011-09-24 (×7): 100 mg via ORAL
  Filled 2011-09-21 (×8): qty 1

## 2011-09-21 MED ORDER — DIPHENHYDRAMINE HCL 12.5 MG/5ML PO ELIX
12.5000 mg | ORAL_SOLUTION | Freq: Four times a day (QID) | ORAL | Status: DC | PRN
  Administered 2011-09-22 – 2011-09-24 (×3): 12.5 mg via ORAL
  Filled 2011-09-21 (×2): qty 5

## 2011-09-21 MED ORDER — BUPIVACAINE ON-Q PAIN PUMP (FOR ORDER SET NO CHG)
INJECTION | Status: DC
  Filled 2011-09-21: qty 1

## 2011-09-21 MED ORDER — FENTANYL CITRATE 0.05 MG/ML IJ SOLN
INTRAMUSCULAR | Status: DC | PRN
  Administered 2011-09-21 (×2): 50 ug via INTRAVENOUS
  Administered 2011-09-21: 100 ug via INTRAVENOUS
  Administered 2011-09-21: 50 ug via INTRAVENOUS

## 2011-09-21 MED ORDER — METOCLOPRAMIDE HCL 5 MG/ML IJ SOLN: 5.0000 mg | Freq: Three times a day (TID) | INTRAMUSCULAR | Status: DC | PRN

## 2011-09-21 MED ORDER — CEFAZOLIN SODIUM 1-5 GM-% IV SOLN
1.0000 g | Freq: Four times a day (QID) | INTRAVENOUS | Status: AC
Start: 2011-09-21 — End: 2011-09-22
  Administered 2011-09-21 – 2011-09-22 (×3): 1 g via INTRAVENOUS
  Filled 2011-09-21 (×3): qty 50

## 2011-09-21 MED ORDER — HYDROMORPHONE HCL PF 1 MG/ML IJ SOLN
INTRAMUSCULAR | Status: DC | PRN
  Administered 2011-09-21: 0.5 mg via INTRAVENOUS
  Administered 2011-09-21: 1 mg via INTRAVENOUS
  Administered 2011-09-21: 0.5 mg via INTRAVENOUS

## 2011-09-21 MED ORDER — ACETAMINOPHEN 650 MG RE SUPP: 650.0000 mg | Freq: Four times a day (QID) | RECTAL | Status: DC | PRN

## 2011-09-21 MED ORDER — SODIUM CHLORIDE 0.9 % IR SOLN
Status: DC | PRN
  Administered 2011-09-21: 1000 mL

## 2011-09-21 MED ORDER — SIMVASTATIN 20 MG PO TABS
20.0000 mg | ORAL_TABLET | Freq: Every day | ORAL | Status: DC
  Administered 2011-09-21: 20 mg via ORAL
  Filled 2011-09-21 (×2): qty 1

## 2011-09-21 MED ORDER — MENTHOL 3 MG MT LOZG: 1.0000 | LOZENGE | OROMUCOSAL | Status: DC | PRN

## 2011-09-21 MED ORDER — DULOXETINE HCL 20 MG PO CPEP
20.0000 mg | ORAL_CAPSULE | Freq: Two times a day (BID) | ORAL | Status: DC
  Administered 2011-09-21 – 2011-09-24 (×6): 20 mg via ORAL
  Filled 2011-09-21 (×8): qty 1

## 2011-09-21 MED ORDER — PROPOFOL 10 MG/ML IV EMUL
INTRAVENOUS | Status: DC | PRN
  Administered 2011-09-21: 130 mg via INTRAVENOUS

## 2011-09-21 MED ORDER — METHOCARBAMOL 500 MG PO TABS
500.0000 mg | ORAL_TABLET | Freq: Four times a day (QID) | ORAL | Status: DC | PRN
  Administered 2011-09-22 (×2): 500 mg via ORAL
  Filled 2011-09-21 (×2): qty 1

## 2011-09-21 MED ORDER — DIPHENHYDRAMINE HCL 12.5 MG/5ML PO ELIX
12.5000 mg | ORAL_SOLUTION | ORAL | Status: DC | PRN
  Filled 2011-09-21 (×2): qty 5

## 2011-09-21 MED ORDER — BISACODYL 10 MG RE SUPP: 10.0000 mg | Freq: Every day | RECTAL | Status: DC | PRN

## 2011-09-21 MED ORDER — SODIUM CHLORIDE 0.9 % IJ SOLN: 9.0000 mL | INTRAMUSCULAR | Status: DC | PRN

## 2011-09-21 MED ORDER — ESOMEPRAZOLE MAGNESIUM 40 MG PO CPDR
40.0000 mg | DELAYED_RELEASE_CAPSULE | Freq: Every day | ORAL | Status: DC
  Administered 2011-09-22 – 2011-09-24 (×3): 40 mg via ORAL
  Filled 2011-09-21 (×3): qty 1

## 2011-09-21 MED ORDER — ACETAMINOPHEN 10 MG/ML IV SOLN
1000.0000 mg | Freq: Four times a day (QID) | INTRAVENOUS | Status: AC
  Administered 2011-09-21 – 2011-09-22 (×4): 1000 mg via INTRAVENOUS
  Filled 2011-09-21 (×4): qty 100

## 2011-09-21 MED ORDER — ONDANSETRON HCL 4 MG/2ML IJ SOLN: 4.0000 mg | Freq: Four times a day (QID) | INTRAMUSCULAR | Status: DC | PRN

## 2011-09-21 MED ORDER — TRAMADOL HCL 50 MG PO TABS: 100.0000 mg | ORAL_TABLET | Freq: Four times a day (QID) | ORAL | Status: DC | PRN

## 2011-09-21 MED ORDER — LACTATED RINGERS IV SOLN
INTRAVENOUS | Status: DC | PRN
  Administered 2011-09-21 (×3): via INTRAVENOUS

## 2011-09-21 MED ORDER — TEMAZEPAM 15 MG PO CAPS
15.0000 mg | ORAL_CAPSULE | Freq: Every evening | ORAL | Status: DC | PRN
  Administered 2011-09-22: 15 mg via ORAL
  Filled 2011-09-21: qty 1

## 2011-09-21 MED ORDER — LACTATED RINGERS IV SOLN: INTRAVENOUS | Status: DC

## 2011-09-21 MED ORDER — ONDANSETRON HCL 4 MG PO TABS: 4.0000 mg | ORAL_TABLET | Freq: Four times a day (QID) | ORAL | Status: DC | PRN

## 2011-09-21 MED ORDER — PREGABALIN 75 MG PO CAPS
75.0000 mg | ORAL_CAPSULE | Freq: Every day | ORAL | Status: DC
  Administered 2011-09-21 – 2011-09-23 (×3): 75 mg via ORAL
  Filled 2011-09-21 (×3): qty 1

## 2011-09-21 MED ORDER — PROMETHAZINE HCL 25 MG/ML IJ SOLN: 6.2500 mg | INTRAMUSCULAR | Status: DC | PRN

## 2011-09-21 SURGICAL SUPPLY — 47 items
BAG ZIPLOCK 12X15 (MISCELLANEOUS) ×2 IMPLANT
BANDAGE ELASTIC 6 VELCRO ST LF (GAUZE/BANDAGES/DRESSINGS) ×2 IMPLANT
BANDAGE ESMARK 6X9 LF (GAUZE/BANDAGES/DRESSINGS) ×1 IMPLANT
BLADE SAG 18X100X1.27 (BLADE) ×2 IMPLANT
BLADE SAW SGTL 11.0X1.19X90.0M (BLADE) ×2 IMPLANT
BNDG ESMARK 6X9 LF (GAUZE/BANDAGES/DRESSINGS) ×2
BOWL SMART MIX CTS (DISPOSABLE) ×2 IMPLANT
CATH KIT ON-Q SILVERSOAK 5IN (CATHETERS) ×2 IMPLANT
CEMENT HV SMART SET (Cement) ×4 IMPLANT
CLOTH BEACON ORANGE TIMEOUT ST (SAFETY) ×2 IMPLANT
CUFF TOURN SGL QUICK 34 (TOURNIQUET CUFF) ×1
CUFF TRNQT CYL 34X4X40X1 (TOURNIQUET CUFF) ×1 IMPLANT
DRAPE EXTREMITY T 121X128X90 (DRAPE) ×2 IMPLANT
DRAPE POUCH INSTRU U-SHP 10X18 (DRAPES) ×2 IMPLANT
DRAPE U-SHAPE 47X51 STRL (DRAPES) ×2 IMPLANT
DRSG ADAPTIC 3X8 NADH LF (GAUZE/BANDAGES/DRESSINGS) ×2 IMPLANT
DRSG PAD ABDOMINAL 8X10 ST (GAUZE/BANDAGES/DRESSINGS) ×2 IMPLANT
DURAPREP 26ML APPLICATOR (WOUND CARE) ×2 IMPLANT
EVACUATOR 1/8 PVC DRAIN (DRAIN) ×2 IMPLANT
FACESHIELD LNG OPTICON STERILE (SAFETY) ×10 IMPLANT
GLOVE BIO SURGEON STRL SZ7.5 (GLOVE) ×2 IMPLANT
GLOVE BIO SURGEON STRL SZ8 (GLOVE) ×2 IMPLANT
GLOVE BIOGEL PI IND STRL 8 (GLOVE) ×2 IMPLANT
GLOVE BIOGEL PI INDICATOR 8 (GLOVE) ×2
GOWN STRL NON-REIN LRG LVL3 (GOWN DISPOSABLE) ×2 IMPLANT
GOWN STRL REIN XL XLG (GOWN DISPOSABLE) ×2 IMPLANT
HANDPIECE INTERPULSE COAX TIP (DISPOSABLE) ×1
IMMOBILIZER KNEE 20 (SOFTGOODS) ×2
IMMOBILIZER KNEE 20 THIGH 36 (SOFTGOODS) ×1 IMPLANT
KIT BASIN OR (CUSTOM PROCEDURE TRAY) ×2 IMPLANT
MANIFOLD NEPTUNE II (INSTRUMENTS) ×2 IMPLANT
NS IRRIG 1000ML POUR BTL (IV SOLUTION) ×2 IMPLANT
PACK TOTAL JOINT (CUSTOM PROCEDURE TRAY) ×2 IMPLANT
PADDING CAST COTTON 6X4 STRL (CAST SUPPLIES) ×6 IMPLANT
POSITIONER SURGICAL ARM (MISCELLANEOUS) ×2 IMPLANT
SET HNDPC FAN SPRY TIP SCT (DISPOSABLE) ×1 IMPLANT
SPONGE GAUZE 4X4 12PLY (GAUZE/BANDAGES/DRESSINGS) ×2 IMPLANT
STRIP CLOSURE SKIN 1/2X4 (GAUZE/BANDAGES/DRESSINGS) ×4 IMPLANT
SUCTION FRAZIER 12FR DISP (SUCTIONS) ×2 IMPLANT
SUT MNCRL AB 4-0 PS2 18 (SUTURE) ×2 IMPLANT
SUT PDS AB 1 CT1 27 (SUTURE) ×6 IMPLANT
SUT VIC AB 2-0 CT1 27 (SUTURE) ×3
SUT VIC AB 2-0 CT1 TAPERPNT 27 (SUTURE) ×3 IMPLANT
TOWEL OR 17X26 10 PK STRL BLUE (TOWEL DISPOSABLE) ×4 IMPLANT
TRAY FOLEY CATH 14FRSI W/METER (CATHETERS) ×2 IMPLANT
WATER STERILE IRR 1500ML POUR (IV SOLUTION) ×2 IMPLANT
WRAP KNEE MAXI GEL POST OP (GAUZE/BANDAGES/DRESSINGS) ×2 IMPLANT

## 2011-09-21 NOTE — Progress Notes (Signed)
REPORT GIVEN TO MICHAEL NANNEY, R.N. FOR LUNCH RELIEF

## 2011-09-21 NOTE — Transfer of Care (Signed)
Immediate Anesthesia Transfer of Care Note  Patient: Madeline [name]  Procedure(s) Performed:  TOTAL KNEE ARTHROPLASTY  Patient Location: PACU  Anesthesia Type: General  Level of Consciousness: awake, alert , oriented and patient cooperative  Airway & Oxygen Therapy: Patient Spontanous Breathing and Patient connected to face mask oxygen  Post-op Assessment: Report given to PACU RN and Post -op Vital signs reviewed and stable  Post vital signs: Reviewed and stable  Complications: No apparent anesthesia complications

## 2011-09-21 NOTE — Interval H&P Note (Signed)
History and Physical Interval Note:  09/21/2011 8:21 AM  Madeline [name]  has presented today for surgery, with the diagnosis of Osteoarthritis Left Knee  The various methods of treatment have been discussed with the patient and family. After consideration of risks, benefits and other options for treatment, the patient has consented to  Procedure(s): TOTAL KNEE ARTHROPLASTY as a surgical intervention .  The patients' history has been reviewed, patient examined, no change in status, stable for surgery.  I have reviewed the patients' chart and labs.  Questions were answered to the patient's satisfaction.     Loanne Drilling

## 2011-09-21 NOTE — Op Note (Signed)
Pre-operative diagnosis- Osteoarthritis  Left knee(s)  Post-operative diagnosis- Osteoarthritis Left knee(s)  Procedure-  Left  Total Knee Arthroplasty  Surgeon- Madeline [name]. Madeline Berryhill, MD  Assistant- Madeline Peace, PA-C   Anesthesia-  General EBL-* No blood loss amount entered *  Drains Hemovac  Tourniquet time-  Total Tourniquet Time Documented: Thigh (Left) - 37 minutes   Complications- None  Condition-PACU - hemodynamically stable.   Brief Clinical Note  Madeline [name] is a 66 y.o. year old female with end stage OA of her left knee with progressively worsening pain and dysfunction. She has constant pain, with activity and at rest and significant functional deficits with difficulties even with ADLs. She has had extensive non-op management including analgesics, injections of cortisone , and home exercise program, but remains in significant pain with significant dysfunction. Radiographs show bone on bone arthritis medial and patellofemoral with bony erosions of the medial tibia and femur. She presents now for left Total Knee Arthroplasty.    Procedure in detail---   The patient is brought into the operating room and positioned supine on the operating table. After successful administration of  General,   a tourniquet is placed high on the  Left thigh(s) and the lower extremity is prepped and draped in the usual sterile fashion. Time out is performed by the operating team and then the  Left lower extremity is wrapped in Esmarch, knee flexed and the tourniquet inflated to 300 mmHg.       A midline incision is made with a ten blade through the subcutaneous tissue to the level of the extensor mechanism. A fresh blade is used to make a medial parapatellar arthrotomy. Soft tissue over the proximal medial tibia is subperiosteally elevated to the joint line with a knife and into the semimembranosus bursa with a Cobb elevator. Soft tissue over the proximal lateral tibia is elevated with attention being  paid to avoiding the patellar tendon on the tibial tubercle. The patella is everted, knee flexed 90 degrees and the ACL and PCL are removed. Findings are bone on bone medial and patellofemoral with large medial and patellofemoral osteophytes.        The drill is used to create a starting hole in the distal femur and the canal is thoroughly irrigated with sterile saline to remove the fatty contents. The 5 degree Left valgus alignment guide is placed into the femoral canal and the distal femoral cutting block is pinned to remove 11 mm off the distal femur. Resection is made with an oscillating saw.      The tibia is subluxed forward and the menisci are removed. The extramedullary alignment guide is placed referencing proximally at the medial aspect of the tibial tubercle and distally along the second metatarsal axis and tibial crest. The block is pinned to remove 2mm off the more deficient medial  side. Resection is made with an oscillating saw. Size 4is the most appropriate size for the tibia and the proximal tibia is prepared with the modular drill and keel punch for that size.      The femoral sizing guide is placed and size 4 narrow is most appropriate. Rotation is marked off the epicondylar axis and confirmed by creating a rectangular flexion gap at 90 degrees. The size 4 cutting block is pinned in this rotation and the anterior, posterior and chamfer cuts are made with the oscillating saw. The intercondylar block is then placed and that cut is made.      Trial size 4 tibial component,  trial size 4 narrow posterior stabilized femur and a 12.5  mm posterior stabilized rotating platform insert trial is placed. Full extension is achieved with excellent varus/valgus and anterior/posterior balance throughout full range of motion. The patella is everted and thickness measured to be 24  mm. Free hand resection is taken to 14 mm, a 38 template is placed, lug holes are drilled, trial patella is placed, and it tracks  normally. Osteophytes are removed off the posterior femur with the trial in place. All trials are removed and the cut bone surfaces prepared with pulsatile lavage. Cement is mixed and once ready for implantation, the size 4 tibial implant, size  4 narrow posterior stabilized femoral component, and the size 38 patella are cemented in place and the patella is held with the clamp. The trial insert is placed and the knee held in full extension. All extruded cement is removed and once the cement is hard the permanent 12.5 mm posterior stabilized rotating platform insert is placed into the tibial tray.      The wound is copiously irrigated with saline solution and the extensor mechanism closed over a hemovac drain with #1 PDS suture. The tourniquet is released for a total tourniquet time of 37  minutes. Flexion against gravity is 140 degrees and the patella tracks normally. Subcutaneous tissue is closed with 2.0 vicryl and subcuticular with running 4.0 Monocryl. The catheter for the Marcaine pain pump is placed and the pump is initiated. The incision is cleaned and dried and steri-strips and a bulky sterile dressing are applied. The limb is placed into a knee immobilizer and the patient is awakened and transported to recovery in stable condition.      Please note that a surgical assistant was a medical necessity for this procedure in order to perform it in a safe and expeditious manner. Surgical assistant was necessary to retract the ligaments and vital neurovascular structures to prevent injury to them and also necessary for proper positioning of the limb to allow for anatomic placement of the prosthesis.   Madeline [name] Madeline Estrin, MD    09/21/2011, 9:34 AM

## 2011-09-21 NOTE — H&P (View-Only) (Signed)
Madeline [name]  DOB: 09/03/1946  Date of Admission:  09/21/2011  Chief Complaint:  Left Knee Pain  History of Present Illness The patient is a 66 year old female who comes in today for a preoperative History and Physical. The patient is scheduled for a left total knee arthroplasty to be performed by Dr. Frank V. Aluisio, MD at [facility] on 09/21/2011. They have been treated conservatively in the past for the above stated problem and despite conservative measures, they continue to have progressive pain and severe functional limitations and dysfunction. They have failed non-operative management including home exercise, medications, and injections. It is felt that they would benefit from undergoing total joint replacement. Risks and benefits of the procedure have been discussed with the patient and they elect to proceed with surgery. There are no active contraindications to surgery such as ongoing infection or rapidly progressive neurological disease.  Problem List/Past Medical Tinnitus Glaucoma Cataract Chronic Bronchitis Varicose veins Hemorrhoids Urinary Tract Infection Osteopenia Measles Mumps Streptococcus Infections. Strep Throat Fibromyalgia Gastroesophageal Reflux Disease Heart murmur High blood pressure Hypercholesterolemia Migraine Headache Osteoarthritis  Allergies IVP DYE. 11/22/2005  Family History Heart Disease. grandmother mothers side, grandfather mothers side, grandmother fathers side and grandfather fathers side Heart disease in female family member before age 65 Hypertension. father Osteoarthritis. mother, father and grandmother mothers side Osteoporosis. mother Father. Deceased. Age 79,Aneurysm of Aorta and Femoral Art., Histiry of dissection(aortic). Mother. Living. Age 89  Social History Alcohol use. current drinker; drinks wine; only occasionally per week Children. 2 Current work status. retired Drug/Alcohol Rehab  (Currently). no Drug/Alcohol Rehab (Previously). no Exercise. Exercises never Illicit drug use. no Living situation. live with spouse Marital status. married Number of flights of stairs before winded. 2-3 Pain Contract. no Tobacco / smoke exposure. no Tobacco use. Never smoker. never smoker Post-Surgical Plans. Plans for [facility] Advance Directives. Living Will  Medication History TiZANidine HCl (2MG Tablet, Oral at bedtime) Active. Pravastatin Sodium (40MG Tablet, Oral at bedtime) Active. NexIUM (40MG Capsule DR, Oral every morning) Active. Meloxicam (7.5MG Tablet, Oral every morning) Active. Lyrica (75MG Capsule, Oral at bedtime) Active. Fosinopril Sodium (10MG Tablet, Oral at bedtime) Active. Diltiazem HCl CR (180MG Capsule ER 24HR, Oral at bedtime) Active. Cymbalta (20MG Capsule DR Part, Oral two times daily) Active. Cyclobenzaprine HCl (10MG Tablet, 2 Oral at bedtime) Active. Aspirin EC (81MG Tablet DR, Oral at bedtime) Active. TraMADol HCl (50MG Tablet, 1-2 Oral every six hours, as needed) Active. Hydrocodone-Acetaminophen (5-500MG Tablet, Oral as needed) Active. Benadryl (25MG Tablet, Oral as needed) Active. (Takes one with Hydrocodone for itching) Centrum Silver ( Oral daily) Active. Vitamin D (Ergocalciferol) (50000UNIT Capsule, Oral every 2 weeks) Active.  Past Surgical History Appendectomy. Date: 1963. Dilation and Curettage of Uterus Hysterectomy. Date: 1977. complete (non-cancerous) Sinus Surgery Tonsillectomy. Date: 1954. A/P Repair and Bladder Sling Surgery. Date: 2003.  Review of Systems General:Present- Memory Loss. Not Present- Chills, Fever, Night Sweats, Appetite Loss, Fatigue, Feeling sick, Weight Gain and Weight Loss. HEENT:Present- Sensitivity to light, Blurred Vision (occassional) and Ringing in the Ears. Not Present- Hearing problems and Nose Bleed. Respiratory:Present- Shortness of breath with exertion. Not Present- Shortness  of breath at rest, Chronic Cough and Wheezing. Cardiovascular:Present- Swelling of Extremities and Leg Cramps. Not Present- Shortness of Breath, Chest Pain and Palpitations. Gastrointestinal:Present- Heartburn and Difficulty Swallowing (sometimes). Not Present- Bloody Stool, Abdominal Pain, Vomiting, Nausea and Incontinence of Stool. Female Genitourinary:Present- Nocturia. Not Present- Blood in Urine, Menstrual Irregularities, Dysuria, Frequency, Hematuria and Incontinence. Musculoskeletal:Present- Joint   Stiffness, Joint Swelling and Joint Pain. Not Present- Muscle Weakness, Muscle Pain and Back Pain. Neurological:Present- Tingling, Numbness and Difficulty with balance (uses cane for balance). Not Present- Burning, Tremor, Headaches and Dizziness. Psychiatric:Present- Memory Loss. Not Present- Anxiety and Depression.  Vitals Weight: 184 lb Height: 67.5 in Body Surface Area: 1.99 m Body Mass Index: 28.39 kg/m Pulse: 88 (Regular) Resp.: 12 (Unlabored) BP: 122/76 (Sitting, Right Arm, Standard)  Physical Exam The physical exam findings are as follows: Patient is a 66 year old white female with continued left knee pain.  General Mental Status - Alert, cooperative and good historian. General Appearance- pleasant. Not in acute distress. Orientation- Oriented X3. Build & Nutrition- Well nourished and Well developed.  Head and Neck Head- normocephalic, atraumatic . Neck Global Assessment- supple. no bruit auscultated on the right and no bruit auscultated on the left.  Eye Pupil- Bilateral- Regular and Round. Motion- Bilateral- EOMI. Wears Glasses  Chest and Lung Exam Auscultation: Breath sounds:- clear at anterior chest wall and - clear at posterior chest wall. Adventitious sounds:- No Adventitious sounds.  Cardiovascular Auscultation:Rhythm- Regular rate and rhythm. Heart Sounds- S1 WNL and S2 WNL. Murmurs & Other Heart Sounds:Auscultation of  the heart reveals - No Murmurs.  Abdomen Palpation/Percussion:Tenderness- Abdomen is non-tender to palpation. Rigidity (guarding)- Abdomen is soft. Auscultation:Auscultation of the abdomen reveals - Bowel sounds normal.  Female Genitourinary Not done, not pertinent to present illness  Musculoskeletal Both hips with normal range of motion, no discomfort. Both knees show slight varus. Left knee range 5 to 120, right knee range about 5 to 125. There is marked crepitus on range of motion of both knees. She is tender medial greater than lateral. There is no instability in either knee. Pulse, sensation, and motor are intact.  RADIOGRAPHS: AP of both knees and lateral showing advanced end stage arthritis of both knees, bone on bone medial and patellofemoral compartment of both knees. The left is a little worse than the right.  Assessment & Plan Osteoarthrosis Left Knee  Patient is for a Left Total Knee Replacement per Dr. Aluisio.  Patient wants to look into [facility] after hospital stay.  Drew Zakyah Yanes, PA-C   

## 2011-09-21 NOTE — Anesthesia Postprocedure Evaluation (Signed)
  Anesthesia Post-op Note  Patient: Madeline [name]  Procedure(s) Performed:  TOTAL KNEE ARTHROPLASTY  Patient Location: PACU  Anesthesia Type: General  Level of Consciousness: awake and alert   Airway and Oxygen Therapy: Patient Spontanous Breathing  Post-op Pain: mild  Post-op Assessment: Post-op Vital signs reviewed, Patient's Cardiovascular Status Stable, Respiratory Function Stable, Patent Airway and No signs of Nausea or vomiting  Post-op Vital Signs: stable  Complications: No apparent anesthesia complications

## 2011-09-21 NOTE — Anesthesia Preprocedure Evaluation (Addendum)
Anesthesia Evaluation  Patient identified by MRN, date of birth, ID band Patient awake    Reviewed: Allergy & Precautions, H&P , NPO status , Patient's Chart, lab work & pertinent test results  Airway Mallampati: II TM Distance: >3 FB Neck ROM: full    Dental No notable dental hx. (+) Teeth Intact and Dental Advisory Given,    Pulmonary neg pulmonary ROS,  clear to auscultation  Pulmonary exam normal       Cardiovascular Exercise Tolerance: Good hypertension, On Medications neg cardio ROS - Valvular Problems/Murmursregular Normal    Neuro/Psych  Headaches, Negative Neurological ROS  Negative Psych ROS   GI/Hepatic negative GI ROS, Neg liver ROS, GERD-  Medicated and Controlled,  Endo/Other  Negative Endocrine ROS  Renal/GU negative Renal ROS  Genitourinary negative   Musculoskeletal  (+) Fibromyalgia -  Abdominal   Peds  Hematology negative hematology ROS (+)   Anesthesia Other Findings   Reproductive/Obstetrics negative OB ROS                         Anesthesia Physical Anesthesia Plan  ASA: II  Anesthesia Plan: General   Post-op Pain Management:    Induction: Intravenous  Airway Management Planned: Oral ETT  Additional Equipment:   Intra-op Plan:   Post-operative Plan: Extubation in OR  Informed Consent: I have reviewed the patients History and Physical, chart, labs and discussed the procedure including the risks, benefits and alternatives for the proposed anesthesia with the patient or authorized representative who has indicated his/her understanding and acceptance.   Dental Advisory Given  Plan Discussed with: CRNA and Surgeon  Anesthesia Plan Comments:        Anesthesia Quick Evaluation

## 2011-09-21 NOTE — Progress Notes (Signed)
Positive staph/ needs 2 more doses of mupirocin  Last used 01.01.2013

## 2011-09-22 ENCOUNTER — Encounter (HOSPITAL_COMMUNITY): Payer: Self-pay | Admitting: Orthopedic Surgery

## 2011-09-22 LAB — BASIC METABOLIC PANEL
BUN: 13 mg/dL (ref 6–23)
CO2: 31 mEq/L (ref 19–32)
Calcium: 8.1 mg/dL — ABNORMAL LOW (ref 8.4–10.5)
Chloride: 100 mEq/L (ref 96–112)
Creatinine, Ser: 0.7 mg/dL (ref 0.50–1.10)
Glucose, Bld: 114 mg/dL — ABNORMAL HIGH (ref 70–99)

## 2011-09-22 LAB — CBC
HCT: 24.8 % — ABNORMAL LOW (ref 36.0–46.0)
MCH: 29.6 pg (ref 26.0–34.0)
MCV: 89.5 fL (ref 78.0–100.0)
Platelets: 176 10*3/uL (ref 150–400)
RBC: 2.77 MIL/uL — ABNORMAL LOW (ref 3.87–5.11)

## 2011-09-22 MED ORDER — MORPHINE SULFATE 2 MG/ML IJ SOLN: 1.0000 mg | INTRAMUSCULAR | Status: DC | PRN

## 2011-09-22 MED ORDER — PRAVASTATIN SODIUM 40 MG PO TABS
40.0000 mg | ORAL_TABLET | Freq: Every day | ORAL | Status: DC
  Administered 2011-09-22 – 2011-09-23 (×2): 40 mg via ORAL
  Filled 2011-09-22 (×3): qty 1

## 2011-09-22 MED ORDER — POLYSACCHARIDE IRON 150 MG PO CAPS
150.0000 mg | ORAL_CAPSULE | Freq: Every day | ORAL | Status: DC
  Administered 2011-09-22 – 2011-09-24 (×3): 150 mg via ORAL
  Filled 2011-09-22 (×3): qty 1

## 2011-09-22 NOTE — Progress Notes (Signed)
Subjective: 1 Day Post-Op Procedure(s) (LRB): TOTAL KNEE ARTHROPLASTY (Left) Patient reports pain as mild and moderate.   Patient seen in rounds with Dr. Lequita Halt. Patient has complaints of some paine thru the night. We will start therapy today. Plan is to go [facility] after hospital stay so we will get social worker involved.  Objective: Vital signs in last 24 hours: Temp:  [97.5 F (36.4 C)-99 F (37.2 C)] 99 F (37.2 C) (01/03 0537) Pulse Rate:  [77-95] 77  (01/03 0537) Resp:  [10-22] 16  (01/03 0537) BP: (99-162)/(64-108) 105/73 mmHg (01/03 0537) SpO2:  [89 %-100 %] 99 % (01/03 0537) Weight:  [85.73 kg (189 lb)] 189 lb (85.73 kg) (01/02 1300)  Intake/Output from previous day:  Intake/Output Summary (Last 24 hours) at 09/22/11 0742 Last data filed at 09/22/11 0539  Gross per 24 hour  Intake   4850 ml  Output   2310 ml  Net   2540 ml    Intake/Output this shift:    Labs:  Basename 09/22/11 0415  HGB 8.2*    Basename 09/22/11 0415  WBC 7.3  RBC 2.77*  HCT 24.8*  PLT 176    Basename 09/22/11 0415  NA 135  K 3.7  CL 100  CO2 31  BUN 13  CREATININE 0.70  GLUCOSE 114*  CALCIUM 8.1*   No results found for this basename: LABPT:2,INR:2 in the last 72 hours  Exam - Neurovascular intact Sensation intact distally Dressing - clean, dry Motor function intact - moving foot and toes well on exam.  Hemovac pulled without difficulty.  Assessment/Plan: 1 Day Post-Op Procedure(s) (LRB): TOTAL KNEE ARTHROPLASTY (Left)  Past Medical History  Diagnosis Date  . Hyperlipidemia   . Cystitis 2007    sepsis post bladder biopsy, Dr Annabell Howells  . Headache 09-13-11    less frequent to none at present  . TMJ click 09-13-11    right side > left  . Cataracts, both eyes 09-13-11    not surgical yet  . Bronchitis, acute 09-13-11    occurs usually once annually-last 1211  . Heart murmur 09-13-11  . Hypertension 09-13-11    tx. meds  . 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 GERD (gastroesophageal reflux disease) 09-14-11    tx. Nexium  . Hemorrhoids, external 09-14-11    not bothersome  . UTI (urinary tract infection) 09-14-11    none recent  . Fibromyalgia 09-14-11  . Arthritis 09-14-11    osteoarthritis, osteopenia  . Strep throat 09-14-11    none in a yr.  . Fractures 09-14-11    toes-left foot  . Edema 09-14-11    occ. ankles/ feet > left leg  Postop ABLA - Iron supplement. Good UOP  Advance diet Up with therapy Discharge to SNF - wants to look into [facility].  DVT Prophylaxis - Xarelto  Protocol Weight-Bearing as tolerated to left leg Keep foley until tomorrow. No vaccines. D/C Morphine PCA, Change to IV push D/C O2 and Pulse OX and try on Room Air  Madeline [name], Madeline Sax 09/22/2011, 7:42 AM

## 2011-09-22 NOTE — Progress Notes (Signed)
09/22/2011 Raynelle Bring BSN CCM [phone] No CM needs at this time. Plans are for SNF rehab; csw referral

## 2011-09-22 NOTE — Progress Notes (Signed)
Physical Therapy Evaluation Patient Details Name: Madeline [name] MRN: [id number] DOB: August 21, 1946 Today's Date: 09/22/2011 1100 - 1144; EVAl, TE Problem List:  Patient Active Problem List  Diagnoses  . HYPERLIPIDEMIA  . HYPERTENSION  . LOW BLOOD PRESSURE  . URI  . GERD  . CONSTIPATION, CHRONIC  . UTI  . 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 FIBROMYALGIA  . OSTEOPOROSIS  . MEMORY LOSS  . DIARRHEA    Past Medical History:  Past Medical History  Diagnosis Date  . Hyperlipidemia   . Cystitis 2007    sepsis post bladder biopsy, Dr Annabell Howells  . Headache 09-13-11    less frequent to none at present  . TMJ click 09-13-11    right side > left  . Cataracts, both eyes 09-13-11    not surgical yet  . Bronchitis, acute 09-13-11    occurs usually once annually-last 1211  . Heart murmur 09-13-11  . Hypertension 09-13-11    tx. meds  . 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 GERD (gastroesophageal reflux disease) 09-14-11    tx. Nexium  . Hemorrhoids, external 09-14-11    not bothersome  . UTI (urinary tract infection) 09-14-11    none recent  . Fibromyalgia 09-14-11  . Arthritis 09-14-11    osteoarthritis, osteopenia  . Strep throat 09-14-11    none in a yr.  . Fractures 09-14-11    toes-left foot  . Edema 09-14-11    occ. ankles/ feet > left leg   Past Surgical History:  Past Surgical History  Procedure Date  . Appendectomy   . Abdominal hysterectomy     dysfunctional menses; age 68  . Tonsillectomy   . Sigmoidoscopy   . Colonoscopy 2011    negative  . Sling procedure 09-14-11    2003 Dr Annabell Howells & Dr Llana Aliment. Dr Annabell Howells also performed  bladder biopsy  . Nasal sinus surgery 09-14-11    right side " sinus lift"    PT Assessment/Plan/Recommendation PT Assessment Clinical Impression Statement: Pt with L TKR presents with decreased L LE strength/ROM  and decreased functional mobility.  Pt will benefit from skilled PT  intervention to maximize IND for next venue of care and eventual d/c home PT Recommendation/Assessment: Patient will need skilled PT in the acute care venue PT Problem List: Decreased strength;Decreased range of motion;Decreased activity tolerance;Decreased mobility;Decreased knowledge of use of DME;Pain PT Therapy Diagnosis : Difficulty walking PT Plan PT Frequency: 7X/week PT Treatment/Interventions: DME instruction;Gait training;Stair training;Functional mobility training;Therapeutic activities;Therapeutic exercise;Patient/family education PT Recommendation Recommendations for Other Services: OT consult Follow Up Recommendations: Skilled nursing facility Equipment Recommended: Rolling walker with 5" wheels PT Goals  Acute Rehab PT Goals PT Goal Formulation: With patient Time For Goal Achievement: 7 days Pt will go Supine/Side to Sit: with supervision PT Goal: Supine/Side to Sit - Progress: Progressing toward goal Pt will go Sit to Supine/Side: with supervision PT Goal: Sit to Supine/Side - Progress: Not met Pt will go Sit to Stand: with supervision PT Goal: Sit to Stand - Progress: Progressing toward goal Pt will go Stand to Sit: with supervision PT Goal: Stand to Sit - Progress: Progressing toward goal Pt will Ambulate: 51 - 150 feet;with supervision;with rolling walker PT Goal: Ambulate - Progress: Progressing toward goal  PT Evaluation Precautions/Restrictions  Precautions Precautions: Knee Required Braces or Orthoses: Yes Knee Immobilizer: Discontinue once straight leg raise with < 10 degree lag Restrictions Weight Bearing Restrictions: No  Other Position/Activity Restrictions: WBAT Prior Functioning  Home Living Lives With: Spouse Receives Help From: Family Type of Home: House Home Layout: One level Prior Function Level of Independence: Independent with basic ADLs;Independent with gait;Independent with transfers Able to Take Stairs?:  Yes Cognition Cognition Arousal/Alertness: Awake/alert Overall Cognitive Status: Appears within functional limits for tasks assessed Orientation Level: Oriented X4 Sensation/Coordination Coordination Gross Motor Movements are Fluid and Coordinated: Yes Extremity Assessment RUE Assessment RUE Assessment: Within Functional Limits LUE Assessment LUE Assessment: Within Functional Limits RLE Assessment RLE Assessment: Within Functional Limits LLE Assessment LLE Assessment: Exceptions to [facility] Mobility (including Balance) Bed Mobility Bed Mobility: Yes Supine to Sit: 3: Mod assist Supine to Sit Details (indicate cue type and reason): cues for sequende/technique Transfers Transfers: Yes Sit to Stand: 1: +2 Total assist;With upper extremity assist;From bed (pt 60%) Sit to Stand Details (indicate cue type and reason): cues for use of UEs and for LE position Stand to Sit: 1: +2 Total assist;With armrests;With upper extremity assist;To chair/3-in-1 (pt 60%) Stand to Sit Details: multimodal cues for posture, sequence, postion from RW , ER on R Ambulation/Gait Ambulation/Gait: Yes Ambulation/Gait Assistance: 1: +2 Total assist Ambulation/Gait Assistance Details (indicate cue type and reason): cues for posture, position from RW, sequence and increased UE WB Ambulation Distance (Feet): 8 Feet Assistive device: Rolling walker Gait Pattern: Step-to pattern    Exercise  Total Joint Exercises Ankle Circles/Pumps: AROM;10 reps;Both;Supine Quad Sets: AROM;10 reps;Supine;Both Heel Slides: AAROM;10 reps;Supine;Left Straight Leg Raises: AAROM;10 reps;Supine;Left End of Session PT - End of Session Equipment Utilized During Treatment: Gait belt Activity Tolerance: Patient tolerated treatment well Patient left: in chair;with call bell in reach Nurse Communication: Mobility status for transfers;Mobility status for ambulation General Behavior During Session: [facility] for tasks performed Cognition: [facility]  for tasks performed  Claudis Giovanelli 09/22/2011, 12:32 PM

## 2011-09-22 NOTE — Progress Notes (Signed)
Physical Therapy Treatment Patient Details Name: Madeline [name] MRN: [id number] DOB: 06/16/1946 Today's Date: 09/22/2011 1321 - 1340; GT PT Assessment/Plan  PT - Assessment/Plan PT Plan: Discharge plan remains appropriate PT Frequency: 7X/week Follow Up Recommendations: Skilled nursing facility Equipment Recommended: Rolling walker with 5" wheels PT Goals  Acute Rehab PT Goals PT Goal Formulation: With patient Time For Goal Achievement: 7 days Pt will go Supine/Side to Sit: with supervision Pt will go Sit to Supine/Side: with supervision PT Goal: Sit to Supine/Side - Progress: Progressing toward goal Pt will go Sit to Stand: with supervision PT Goal: Sit to Stand - Progress: Progressing toward goal Pt will go Stand to Sit: with supervision PT Goal: Stand to Sit - Progress: Progressing toward goal Pt will Ambulate: 51 - 150 feet;with supervision;with rolling walker PT Goal: Ambulate - Progress: Progressing toward goal  PT Treatment Precautions/Restrictions  Precautions Precautions: Knee Required Braces or Orthoses: Yes Knee Immobilizer: Discontinue once straight leg raise with < 10 degree lag Restrictions Weight Bearing Restrictions: No Other Position/Activity Restrictions: WBAT Mobility (including Balance) Bed Mobility Sit to Supine - Right: 3: Mod assist Transfers Sit to Stand: 1: +2 Total assist;With armrests;With upper extremity assist;From chair/3-in-1 Sit to Stand Details (indicate cue type and reason): cues for use of UEs and for LE position Stand to Sit: 1: +2 Total assist;To bed;With upper extremity assist Stand to Sit Details: cues for use of UEs and for LE position Ambulation/Gait Ambulation/Gait Assistance: 1: +2 Total assist Ambulation/Gait Assistance Details (indicate cue type and reason): cues for posture, position from RW, sequence and increased UE WB Ambulation Distance (Feet): 17 Feet Assistive device: Rolling walker Gait Pattern: Step-to pattern      Exercise    End of Session PT - End of Session Equipment Utilized During Treatment: Gait belt Activity Tolerance: Patient tolerated treatment well Patient left: in bed;with call bell in reach;with family/visitor present Nurse Communication: Mobility status for transfers;Mobility status for ambulation General Behavior During Session: [facility] for tasks performed Cognition: [facility] for tasks performed  Javae Braaten 09/22/2011, 3:05 PM

## 2011-09-22 NOTE — Progress Notes (Signed)
FL2 in shadow chart for MD signature. Pt plans to go to [facility] for rehab. Madeline [name] has confirmed D/C plan. CSW will assist with D/C planning to SNF. [employer] requires prior auth for SNF placement. Info has been submitted.

## 2011-09-23 DIAGNOSIS — E871 Hypo-osmolality and hyponatremia: Secondary | ICD-10-CM | POA: Diagnosis not present

## 2011-09-23 DIAGNOSIS — D62 Acute posthemorrhagic anemia: Secondary | ICD-10-CM | POA: Diagnosis not present

## 2011-09-23 LAB — BASIC METABOLIC PANEL
BUN: 6 mg/dL (ref 6–23)
Calcium: 8.6 mg/dL (ref 8.4–10.5)
Creatinine, Ser: 0.54 mg/dL (ref 0.50–1.10)
GFR calc Af Amer: >90 mL/min (ref >90)
GFR calc non Af Amer: >90 mL/min (ref >90)

## 2011-09-23 LAB — CBC
HCT: 24.9 % — ABNORMAL LOW (ref 36.0–46.0)
MCH: 29.7 pg (ref 26.0–34.0)
MCHC: 33.7 g/dL (ref 30.0–36.0)
MCV: 88 fL (ref 78.0–100.0)
Platelets: 187 10*3/uL (ref 150–400)
RDW: 13.6 % (ref 11.5–15.5)
WBC: 11.7 10*3/uL — ABNORMAL HIGH (ref 4.0–10.5)

## 2011-09-23 MED ORDER — POLYSACCHARIDE IRON 150 MG PO CAPS: 150.0000 mg | ORAL_CAPSULE | Freq: Every day | ORAL | Status: DC

## 2011-09-23 MED ORDER — METHOCARBAMOL 500 MG PO TABS: 500.0000 mg | ORAL_TABLET | Freq: Four times a day (QID) | ORAL | Status: DC | PRN

## 2011-09-23 MED ORDER — OXYCODONE HCL 5 MG PO TABS: 5.0000 mg | ORAL_TABLET | ORAL | Status: DC | PRN

## 2011-09-23 MED ORDER — DSS 100 MG PO CAPS: 100.0000 mg | ORAL_CAPSULE | Freq: Two times a day (BID) | ORAL | Status: AC

## 2011-09-23 MED ORDER — RIVAROXABAN 10 MG PO TABS: 10.0000 mg | ORAL_TABLET | Freq: Every day | ORAL | Status: DC

## 2011-09-23 NOTE — Progress Notes (Signed)
OT Screen Order received, chart reviewed. Spoke briefly with pt who politely declined OT eval. Plan is for d/c to [facility] tomorrow. Will sign off and defer OT eval to that venue.  Garrel Ridgel, OTR/L  Pager [phone] 09/23/2011

## 2011-09-23 NOTE — Progress Notes (Signed)
Physical Therapy Treatment Patient Details Name: Madeline [name] MRN: [id number] DOB: 03/05/1946 Today's Date: 09/23/2011 1444 - 1508; 2GT PT Assessment/Plan  PT - Assessment/Plan PT Plan: Discharge plan remains appropriate PT Frequency: 7X/week Follow Up Recommendations: Skilled nursing facility Equipment Recommended: Defer to next venue PT Goals  Acute Rehab PT Goals Time For Goal Achievement: 7 days Pt will go Supine/Side to Sit: with supervision PT Goal: Supine/Side to Sit - Progress: Progressing toward goal Pt will go Sit to Supine/Side: with supervision PT Goal: Sit to Supine/Side - Progress: Progressing toward goal Pt will go Sit to Stand: with supervision PT Goal: Sit to Stand - Progress: Progressing toward goal Pt will go Stand to Sit: with supervision PT Goal: Stand to Sit - Progress: Progressing toward goal Pt will Ambulate: 51 - 150 feet;with supervision;with rolling walker PT Goal: Ambulate - Progress: Progressing toward goal  PT Treatment Precautions/Restrictions  Precautions Precautions: Knee Required Braces or Orthoses: Yes Knee Immobilizer: Discontinue once straight leg raise with < 10 degree lag Restrictions Weight Bearing Restrictions: No Other Position/Activity Restrictions: WBAT Mobility (including Balance) Bed Mobility Supine to Sit: 4: Min assist Sit to Supine - Right: 4: Min assist Transfers Sit to Stand: 4: Min assist;With armrests;From bed;From chair/3-in-1;With upper extremity assist Sit to Stand Details (indicate cue type and reason): cues for use of UEs and for LE position Stand to Sit: 4: Min assist;To bed;To chair/3-in-1;With armrests;With upper extremity assist Stand to Sit Details: cues for use of UEs and for LE position Ambulation/Gait Ambulation/Gait Assistance: 4: Min assist;3: Mod assist Ambulation/Gait Assistance Details (indicate cue type and reason): cues for posture and position from RW Ambulation Distance (Feet): 100  Feet Assistive device: Rolling walker Gait Pattern: Step-to pattern    Exercise    End of Session PT - End of Session Activity Tolerance: Patient tolerated treatment well Patient left: in bed;with call bell in reach;with family/visitor present Nurse Communication: Mobility status for transfers;Mobility status for ambulation General Behavior During Session: [facility] for tasks performed Cognition: [facility] for tasks performed  Kirstie Larsen 09/23/2011, 3:49 PM

## 2011-09-23 NOTE — Progress Notes (Signed)
Subjective: 2 Days Post-Op Procedure(s) (LRB): TOTAL KNEE ARTHROPLASTY (Left) Patient reports pain as mild.   Patient seen in rounds with Dr. Lequita Halt. Patient has complaints of soreness but doing better today.  She is sitting up eating breakfast this morning.  Plan is to go to SNF.  Probably will be ready for tomorrow.  She is looking into [facility].  Objective: Vital signs in last 24 hours: Temp:  [97.4 F (36.3 C)-99.8 F (37.7 C)] 98.9 F (37.2 C) (01/04 0533) Pulse Rate:  [77-100] 94  (01/04 0533) Resp:  [16] 16  (01/04 0533) BP: (119-144)/(72-84) 123/75 mmHg (01/04 0533) SpO2:  [93 %-97 %] 93 % (01/04 0533)  Intake/Output from previous day:  Intake/Output Summary (Last 24 hours) at 09/23/11 0745 Last data filed at 09/23/11 0700  Gross per 24 hour  Intake 1412.5 ml  Output   4550 ml  Net -3137.5 ml    Intake/Output this shift:    Labs:  Basename 09/23/11 0431 09/22/11 0415  HGB 8.4* 8.2*    Basename 09/23/11 0431 09/22/11 0415  WBC 11.7* 7.3  RBC 2.83* 2.77*  HCT 24.9* 24.8*  PLT 187 176    Basename 09/23/11 0431 09/22/11 0415  NA 134* 135  K 3.5 3.7  CL 99 100  CO2 28 31  BUN 6 13  CREATININE 0.54 0.70  GLUCOSE 136* 114*  CALCIUM 8.6 8.1*   No results found for this basename: LABPT:2,INR:2 in the last 72 hours  Exam - Neurovascular intact Sensation intact distally Dressing/Incision - clean, dry, no drainage Motor function intact - moving foot and toes well on exam.   Assessment/Plan: 2 Days Post-Op Procedure(s) (LRB): TOTAL KNEE ARTHROPLASTY (Left)  Past Medical History  Diagnosis Date  . Hyperlipidemia   . Cystitis 2007    sepsis post bladder biopsy, Dr Annabell Howells  . Headache 09-13-11    less frequent to none at present  . TMJ click 09-13-11    right side > left  . Cataracts, both eyes 09-13-11    not surgical yet  . Bronchitis, acute 09-13-11    occurs usually once annually-last 1211  . Heart murmur 09-13-11  . Hypertension 09-13-11      tx. meds  . 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 GERD (gastroesophageal reflux disease) 09-14-11    tx. Nexium  . Hemorrhoids, external 09-14-11    not bothersome  . UTI (urinary tract infection) 09-14-11    none recent  . Fibromyalgia 09-14-11  . Arthritis 09-14-11    osteoarthritis, osteopenia  . Strep throat 09-14-11    none in a yr.  . Fractures 09-14-11    toes-left foot  . Edema 09-14-11    occ. ankles/ feet > left leg  ABLA - HGB stable - Recheck in AM Hyponatremia, Mild - Recheck in AM  Up with therapy Plan for discharge tomorrow Discharge to SNF [facility] tomorrow  DVT Prophylaxis - Xarelto  Protocol Weight-Bearing as tolerated to left leg  Madeline [name] 09/23/2011, 7:45 AM

## 2011-09-23 NOTE — Progress Notes (Signed)
Physical Therapy Treatment Patient Details Name: Madeline [name] MRN: [id number] DOB: 1946-07-24 Today's Date: 09/23/2011 1015 - 1045; 2GT PT Assessment/Plan  PT - Assessment/Plan PT Plan: Discharge plan remains appropriate PT Frequency: 7X/week Follow Up Recommendations: Skilled nursing facility Equipment Recommended: Rolling walker with 5" wheels PT Goals  Acute Rehab PT Goals Time For Goal Achievement: 7 days Pt will go Supine/Side to Sit: with supervision PT Goal: Supine/Side to Sit - Progress: Progressing toward goal Pt will go Sit to Supine/Side: with supervision Pt will go Sit to Stand: with supervision PT Goal: Sit to Stand - Progress: Progressing toward goal Pt will go Stand to Sit: with supervision PT Goal: Stand to Sit - Progress: Progressing toward goal Pt will Ambulate: 51 - 150 feet;with supervision;with rolling walker PT Goal: Ambulate - Progress: Progressing toward goal  PT Treatment Precautions/Restrictions  Precautions Precautions: Knee Required Braces or Orthoses: Yes Knee Immobilizer: Discontinue once straight leg raise with < 10 degree lag Restrictions Weight Bearing Restrictions: No Other Position/Activity Restrictions: WBAT Mobility (including Balance) Bed Mobility Supine to Sit: 4: Min assist;3: Mod assist Transfers Sit to Stand: 1: +2 Total assist;With upper extremity assist;From bed;With armrests;From chair/3-in-1 Sit to Stand Details (indicate cue type and reason): cues for use of UEs and LE position Stand to Sit: To chair/3-in-1;With armrests;With upper extremity assist;1: +2 Total assist Stand to Sit Details: cues for use of UEs and for LE position Ambulation/Gait Ambulation/Gait Assistance: 1: +2 Total assist Ambulation/Gait Assistance Details (indicate cue type and reason): cues for stride length, posture and position from RW Ambulation Distance (Feet): 21 Feet (21 + 17) Assistive device: Rolling walker Gait Pattern: Step-to pattern      Exercise    End of Session PT - End of Session Equipment Utilized During Treatment: Gait belt Activity Tolerance: Patient limited by fatigue;Patient limited by pain Patient left: in chair;with call bell in reach;with family/visitor present Nurse Communication: Mobility status for transfers;Mobility status for ambulation General Behavior During Session: [facility] for tasks performed Cognition: [facility] for tasks performed  Olyn Landstrom 09/23/2011, 12:46 PM

## 2011-09-23 NOTE — Progress Notes (Signed)
Physical Therapy Treatment Patient Details Name: Madeline [name] MRN: [id number] DOB: 09/16/1946 Today's Date: 09/23/2011 1132 - 1203; 2TE PT Assessment/Plan  PT - Assessment/Plan PT Plan: Discharge plan remains appropriate PT Frequency: 7X/week Follow Up Recommendations: Skilled nursing facility Equipment Recommended: Rolling walker with 5" wheels PT Goals  Acute Rehab PT Goals PT Goal Formulation: With patient Time For Goal Achievement: 7 days Pt will go Supine/Side to Sit: with supervision PT Goal: Supine/Side to Sit - Progress: Progressing toward goal Pt will go Sit to Supine/Side: with supervision Pt will go Sit to Stand: with supervision PT Goal: Sit to Stand - Progress: Progressing toward goal Pt will go Stand to Sit: with supervision PT Goal: Stand to Sit - Progress: Progressing toward goal Pt will Ambulate: 51 - 150 feet;with supervision;with rolling walker PT Goal: Ambulate - Progress: Progressing toward goal  PT Treatment Precautions/Restrictions  Precautions Precautions: Knee Required Braces or Orthoses: Yes Knee Immobilizer: Discontinue once straight leg raise with < 10 degree lag Restrictions Weight Bearing Restrictions: No Other Position/Activity Restrictions: WBAT Mobility (including Balance) Bed Mobility Supine to Sit: 4: Min assist;3: Mod assist Transfers Sit to Stand: With armrests;From chair/3-in-1;3: Mod assist Sit to Stand Details (indicate cue type and reason): cues for use of UEs and for LE position Stand to Sit: 3: Mod assist;With upper extremity assist;With armrests;To chair/3-in-1 Stand to Sit Details: cues for use of UEs and for LE position Ambulation/Gait Ambulation/Gait Assistance: 3: Mod assist Ambulation/Gait Assistance Details (indicate cue type and reason): cues for posture, sequence and position from RW Ambulation Distance (Feet): 5 Feet Assistive device: Rolling walker Gait Pattern: Step-to pattern    Exercise  Total Joint  Exercises Ankle Circles/Pumps: AROM;20 reps;Supine;Both Quad Sets: AROM;20 reps;Supine;Both Heel Slides: AAROM;Supine;20 reps;Left Straight Leg Raises: AAROM;20 reps;Supine;Left End of Session PT - End of Session Equipment Utilized During Treatment: Gait belt Activity Tolerance: Patient tolerated treatment well Patient left: in chair;with call bell in reach;with family/visitor present Nurse Communication: Mobility status for transfers;Mobility status for ambulation General Behavior During Session: [facility] for tasks performed Cognition: [facility] for tasks performed  Madeline [name] 09/23/2011, 12:51 PM

## 2011-09-23 NOTE — Progress Notes (Signed)
CSW obtained prior authorization through [facility] (Case Manager = Mindi Junker ph#: [phone]), ambulance authorization #: [id number]. After Visit Summary faxed to [facility]. Patient is set to discharge to [facility] on Saturday.   Unice Bailey, LCSWA [phone]

## 2011-09-24 LAB — BASIC METABOLIC PANEL
BUN: 8 mg/dL (ref 6–23)
Chloride: 99 mEq/L (ref 96–112)
GFR calc Af Amer: >90 mL/min (ref >90)
GFR calc non Af Amer: >90 mL/min (ref >90)
Potassium: 3.4 mEq/L — ABNORMAL LOW (ref 3.5–5.1)

## 2011-09-24 LAB — CBC
HCT: 24.3 % — ABNORMAL LOW (ref 36.0–46.0)
MCHC: 33.7 g/dL (ref 30.0–36.0)
Platelets: 199 10*3/uL (ref 150–400)
RDW: 13.9 % (ref 11.5–15.5)
WBC: 8.9 10*3/uL (ref 4.0–10.5)

## 2011-09-24 MED ORDER — RIVAROXABAN 10 MG PO TABS: 10.0000 mg | ORAL_TABLET | Freq: Every day | ORAL | Status: DC

## 2011-09-24 MED ORDER — OXYCODONE HCL 5 MG PO TABS: 5.0000 mg | ORAL_TABLET | ORAL | Status: AC | PRN

## 2011-09-24 MED ORDER — METHOCARBAMOL 500 MG PO TABS: 500.0000 mg | ORAL_TABLET | Freq: Four times a day (QID) | ORAL | Status: AC | PRN

## 2011-09-24 NOTE — Discharge Summary (Signed)
Physician Discharge Summary   Patient ID: Madeline [name] MRN: [id number] DOB/AGE: [birth year] 66 y.o.  Admit date: 09/21/2011 Discharge date: 09/24/2011  Primary Diagnosis: Osteoarthrosis Left Knee   Admission Diagnoses: Past Medical History  Diagnosis Date  . Hyperlipidemia   . Cystitis 2007    sepsis post bladder biopsy, Dr Annabell Howells  . Headache 09-13-11    less frequent to none at present  . TMJ click 09-13-11    right side > left  . Cataracts, both eyes 09-13-11    not surgical yet  . Bronchitis, acute 09-13-11    occurs usually once annually-last 1211  . Heart murmur 09-13-11  . Hypertension 09-13-11    tx. meds  . 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 GERD (gastroesophageal reflux disease) 09-14-11    tx. Nexium  . Hemorrhoids, external 09-14-11    not bothersome  . UTI (urinary tract infection) 09-14-11    none recent  . Fibromyalgia 09-14-11  . Arthritis 09-14-11    osteoarthritis, osteopenia  . Strep throat 09-14-11    none in a yr.  . Fractures 09-14-11    toes-left foot  . Edema 09-14-11    occ. ankles/ feet > left leg    Discharge Diagnoses:  Active Problems:  Mild Postop Hyponatremia  Postop Acute blood loss anemia   Procedure: Procedure(s) (LRB): TOTAL KNEE ARTHROPLASTY (Left)   Consults: none  HPI:  Madeline [name] is a 66 y.o. year old female with end stage OA of her left knee with progressively worsening pain and dysfunction. She has constant pain, with activity and at rest and significant functional deficits with difficulties even with ADLs. She has had extensive non-op management including analgesics, injections of cortisone , and home exercise program, but remains in significant pain with significant dysfunction. Radiographs show bone on bone arthritis medial and patellofemoral with bony erosions of the medial tibia and femur. She presents now for left Total Knee Arthroplasty.      Laboratory  Data: Hospital Outpatient Visit on 09/14/2011  Component Date Value Range Status  . aPTT (seconds) 09/14/2011 29  24-37 Final  . WBC (K/uL) 09/14/2011 7.0  4.0-10.5 Final  . RBC (MIL/uL) 09/14/2011 4.21  3.87-5.11 Final  . Hemoglobin (g/dL) 40/98/1191 47.8  29.5-62.1 Final  . HCT (%) 09/14/2011 37.6  36.0-46.0 Final  . MCV (fL) 09/14/2011 89.3  78.0-100.0 Final  . MCH (pg) 09/14/2011 29.2  26.0-34.0 Final  . MCHC (g/dL) 30/86/5784 69.6  29.5-28.4 Final  . RDW (%) 09/14/2011 13.6  11.5-15.5 Final  . Platelets (K/uL) 09/14/2011 224  150-400 Final  . Sodium (mEq/L) 09/14/2011 139  135-145 Final  . Potassium (mEq/L) 09/14/2011 4.6  3.5-5.1 Final  . Chloride (mEq/L) 09/14/2011 102  96-112 Final  . CO2 (mEq/L) 09/14/2011 31  19-32 Final  . Glucose, Bld (mg/dL) 13/24/4010 71  27-25 Final  . BUN (mg/dL) 36/64/4034 29* 7-42 Final  . Creatinine, Ser (mg/dL) 59/56/3875 6.43  3.29-5.18 Final  . Calcium (mg/dL) 84/16/6063 9.9  0.1-60.1 Final  . Total Protein (g/dL) 09/32/3557 7.1  3.2-2.0 Final  . Albumin (g/dL) 25/42/7062 3.9  3.7-6.2 Final  . AST (U/L) 09/14/2011 19  0-37 Final  . ALT (U/L) 09/14/2011 13  0-35 Final  . Alkaline Phosphatase (U/L) 09/14/2011 77  39-117 Final  . Total Bilirubin (mg/dL) 83/15/1761 0.2* 6.0-7.3 Final  . GFR calc non Af Amer (mL/min) 09/14/2011 >90  >90 Final  . GFR calc Af  Amer (mL/min) 09/14/2011 >90  >90 Final   Comment:                                 The eGFR has been calculated                          using the CKD EPI equation.                          This calculation has not been                          validated in all clinical                          situations.                          eGFR's persistently                          <90 mL/min signify                          possible Chronic Kidney Disease.  Marland Kitchen Prothrombin Time (seconds) 09/14/2011 12.3  11.6-15.2 Final  . INR  09/14/2011 0.90  0.00-1.49 Final  . ABO/RH(D)  09/14/2011 A POS   Final   . Antibody Screen  09/14/2011 NEG   Final  . Sample Expiration  09/14/2011 09/24/2011   Final  . Color, Urine  09/14/2011 YELLOW  YELLOW Final  . APPearance  09/14/2011 CLEAR  CLEAR Final  . Specific Gravity, Urine  09/14/2011 1.021  1.005-1.030 Final  . pH  09/14/2011 6.0  5.0-8.0 Final  . Glucose, UA (mg/dL) 16/06/9603 NEGATIVE  NEGATIVE Final  . Hgb urine dipstick  09/14/2011 NEGATIVE  NEGATIVE Final  . Bilirubin Urine  09/14/2011 NEGATIVE  NEGATIVE Final  . Ketones, ur (mg/dL) 54/05/8118 NEGATIVE  NEGATIVE Final  . Protein, ur (mg/dL) 14/78/2956 NEGATIVE  NEGATIVE Final  . Urobilinogen, UA (mg/dL) 21/30/8657 0.2  8.4-6.9 Final  . Nitrite  09/14/2011 NEGATIVE  NEGATIVE Final  . Leukocytes, UA  09/14/2011 NEGATIVE  NEGATIVE Final   MICROSCOPIC NOT DONE ON URINES WITH NEGATIVE PROTEIN, BLOOD, LEUKOCYTES, NITRITE, OR GLUCOSE <1000 mg/dL.  Marland Kitchen MRSA, PCR  09/14/2011 NEGATIVE  NEGATIVE Final  . Staphylococcus aureus  09/14/2011 POSITIVE* NEGATIVE Final   Comment:                                 The Xpert SA Assay (FDA                          approved for NASAL specimens                          only), is one component of                          a comprehensive surveillance  program.  It is not intended                          to diagnose infection nor to                          guide or monitor treatment.  . ABO/RH(D)  09/14/2011 A POS   Final    Basename 09/24/11 0423 09/23/11 0431 09/22/11 0415  HGB 8.2* 8.4* 8.2*    Basename 09/24/11 0423 09/23/11 0431  WBC 8.9 11.7*  RBC 2.74* 2.83*  HCT 24.3* 24.9*  PLT 199 187    Basename 09/24/11 0423 09/23/11 0431  NA 134* 134*  K 3.4* 3.5  CL 99 99  CO2 29 28  BUN 8 6  CREATININE 0.55 0.54  GLUCOSE 98 136*  CALCIUM 8.5 8.6   No results found for this basename: LABPT:2,INR:2 in the last 72 hours  X-Rays:Dg Chest 2 View  09/14/2011  *RADIOLOGY REPORT*  Clinical Data: 66 year old female preoperative  study.  Left knee osteoarthritis.  CHEST - 2 VIEW  Comparison: 05/25/2006.  Findings: Stable lung volumes with mild elevation of the right hemidiaphragm.  Cardiac size and mediastinal contours are within normal limits.  Visualized tracheal air column is within normal limits.  No pneumothorax, pulmonary edema, pleural effusion or confluent pulmonary opacity. No acute osseous abnormality identified.  IMPRESSION: No acute cardiopulmonary abnormality.  Original Report Authenticated By: Ulla Potash III, M.D.    EKG: Orders placed in visit on 06/30/11  . EKG 12-LEAD     Hospital Course: Patient was admitted to [facility] and taken to the OR and underwent the above state procedure without complications.  Patient tolerated the procedure well and was later transferred to the recovery room and then to the orthopaedic floor for postoperative care.  They were given PO and IV analgesics for pain control following their surgery.  They were given 24 hours of postoperative antibiotics and started on DVT prophylaxis.   PT and OT were ordered for total joint protocol.  Discharge planning consulted to help with postop disposition and equipment needs.  She wanted to look into [facility].  Patient had a decent night on the evening of surgery and started to get up with therapy on day one.  PCA was discontinued and they were weaned over to PO meds.  Hemovac drain was pulled without difficulty.  Continued to progress with therapy into day two.  Dressing was changed on day two and the incision was healing well.  By day three, the patient had progressed with therapy and meeting goals.  Incision was healing well.  Patient was seen in rounds and was ready to go to [facility] on Saturday.    Discharge Medications: Prior to Admission medications   Medication Sig Start Date End Date Taking? Authorizing Provider  diltiazem (DILACOR XR) 180 MG 24 hr capsule Take 180 mg by mouth at bedtime.    Yes Historical Provider, MD    DULoxetine (CYMBALTA) 20 MG capsule Take 20 mg by mouth 2 (two) times daily.    Yes Historical Provider, MD  esomeprazole (NEXIUM) 40 MG packet Take 40 mg by mouth daily before breakfast.    Yes Historical Provider, MD  fosinopril (MONOPRIL) 10 MG tablet Take 10 mg by mouth at bedtime.    Yes Historical Provider, MD  pravastatin (PRAVACHOL) 40 MG tablet Take 40 mg by mouth at bedtime.  Yes Historical Provider, MD  pregabalin (LYRICA) 75 MG capsule Take 75 mg by mouth at bedtime.    Yes Historical Provider, MD  traMADol (ULTRAM) 50 MG tablet Take 100 mg by mouth every 6 (six) hours as needed. Pain    Yes Historical Provider, MD  docusate sodium 100 MG CAPS Take 100 mg by mouth 2 (two) times daily. 09/23/11 10/03/11  Taegen Lennox, PA  methocarbamol (ROBAXIN) 500 MG tablet Take 1 tablet (500 mg total) by mouth every 6 (six) hours as needed. 09/24/11 10/04/11  Levie Owensby, PA  oxyCODONE (OXY IR/ROXICODONE) 5 MG immediate release tablet Take 1-2 tablets (5-10 mg total) by mouth every 4 (four) hours as needed for pain. 09/24/11 10/04/11  Ayanna Gheen, PA  polysaccharide iron (NIFEREX) 150 MG CAPS capsule Take 1 capsule (150 mg total) by mouth daily. 09/23/11   Shaquira Moroz Julien Girt, PA  rivaroxaban (XARELTO) 10 MG TABS tablet Take 1 tablet (10 mg total) by mouth daily with breakfast. 09/24/11   Eeshan Verbrugge Julien Girt, PA    Diet: regular  Activity:WBAT   Follow-up:in 2 weeks  Disposition: [facility]  Discharged Condition: good   Discharge Orders    Future Orders Please Complete By Expires   Diet - low sodium heart healthy      Diet - low sodium heart healthy      Call MD / Call 911      Comments:   If you experience chest pain or shortness of breath, CALL 911 and be transported to the hospital emergency room.  If you develope a fever above 101 F, pus (white drainage) or increased drainage or redness at the wound, or calf pain, call your surgeon's office.   Constipation Prevention       Comments:   Drink plenty of fluids.  Prune juice may be helpful.  You may use a stool softener, such as Colace (over the counter) 100 mg twice a day.  Use MiraLax (over the counter) for constipation as needed.   Increase activity slowly as tolerated      Weight Bearing as taught in Physical Therapy      Comments:   Use a walker or crutches as instructed.   Discharge instructions      Comments:   Pick up stool softner and laxative for home. Do not submerge incision under water. May shower. Continue to use ice for pain and swelling from surgery.    Driving restrictions      Comments:   No driving   Lifting restrictions      Comments:   No lifting   TED hose      Comments:   Use stockings (TED hose) for 3 weeks on both leg(s).  You may remove them at night for sleeping.   Change dressing      Comments:   Change dressing  daily with sterile 4 x 4 inch gauze dressing and apply TED hose.   Do not put a pillow under the knee. Place it under the heel.      Call MD / Call 911      Comments:   If you experience chest pain or shortness of breath, CALL 911 and be transported to the hospital emergency room.  If you develope a fever above 101 F, pus (white drainage) or increased drainage or redness at the wound, or calf pain, call your surgeon's office.   Constipation Prevention      Comments:   Drink plenty of fluids.  Prune juice may  be helpful.  You may use a stool softener, such as Colace (over the counter) 100 mg twice a day.  Use MiraLax (over the counter) for constipation as needed.   Increase activity slowly as tolerated      Weight Bearing as taught in Physical Therapy      Comments:   Use a walker or crutches as instructed.   Discharge instructions      Comments:   Pick up stool softner and laxative for home. Do not submerge incision under water. May shower. Continue to use ice for pain and swelling from surgery.    Driving restrictions      Comments:   No driving    Lifting restrictions      Comments:   No lifting   TED hose      Comments:   Use stockings (TED hose) for 3 weeks on both leg(s).  You may remove them at night for sleeping.   Change dressing      Comments:   Change dressing daily with sterile 4 x 4 inch gauze dressing and apply TED hose.   Do not put a pillow under the knee. Place it under the heel.        Current Discharge Medication List    START taking these medications   Details  docusate sodium 100 MG CAPS Take 100 mg by mouth 2 (two) times daily. Qty: 30 capsule, Refills: 0    methocarbamol (ROBAXIN) 500 MG tablet Take 1 tablet (500 mg total) by mouth every 6 (six) hours as needed. Qty: 80 tablet, Refills: 0    oxyCODONE (OXY IR/ROXICODONE) 5 MG immediate release tablet Take 1-2 tablets (5-10 mg total) by mouth every 4 (four) hours as needed for pain. Qty: 90 tablet, Refills: 0    polysaccharide iron (NIFEREX) 150 MG CAPS capsule Take 1 capsule (150 mg total) by mouth daily. Qty: 21 each, Refills: 0    rivaroxaban (XARELTO) 10 MG TABS tablet Take 1 tablet (10 mg total) by mouth daily with breakfast. Qty: 18 tablet, Refills: 0      CONTINUE these medications which have NOT CHANGED   Details  diltiazem (DILACOR XR) 180 MG 24 hr capsule Take 180 mg by mouth at bedtime.     DULoxetine (CYMBALTA) 20 MG capsule Take 20 mg by mouth 2 (two) times daily.     esomeprazole (NEXIUM) 40 MG packet Take 40 mg by mouth daily before breakfast.     fosinopril (MONOPRIL) 10 MG tablet Take 10 mg by mouth at bedtime.     pravastatin (PRAVACHOL) 40 MG tablet Take 40 mg by mouth at bedtime.     pregabalin (LYRICA) 75 MG capsule Take 75 mg by mouth at bedtime.     traMADol (ULTRAM) 50 MG tablet Take 100 mg by mouth every 6 (six) hours as needed. Pain       STOP taking these medications     Calcium Citrate-Vitamin D (CITRACAL + D PO)      cyclobenzaprine (FLEXERIL) 10 MG tablet      hydrocodone-acetaminophen (LORCET-HD) 5-500 MG  per capsule      multivitamin (THERAGRAN) per tablet      tiZANidine (ZANAFLEX) 2 MG tablet      aspirin 81 MG tablet      meloxicam (MOBIC) 7.5 MG tablet        Follow-up Information    Follow up with ALUISIO,FRANK V. Make an appointment in 2 weeks. (Please have facility call and make appointment  for patient and help arrange transportation.)    Contact information:   [facility] [address], [address] [place] [address] [phone]          Signed: Patrica Duel 09/24/2011, 8:05 AM

## 2011-09-24 NOTE — Progress Notes (Signed)
Physical Therapy Treatment Patient Details Name: BURMA KETCHER MRN: [id number] DOB: 28-Oct-1945 Today's Date: 09/24/2011 1002 - 1050; TE, 2GT PT Assessment/Plan  PT - Assessment/Plan PT Plan: Discharge plan remains appropriate PT Frequency: 7X/week Follow Up Recommendations: Skilled nursing facility Equipment Recommended: Defer to next venue PT Goals  Acute Rehab PT Goals PT Goal Formulation: With patient Time For Goal Achievement: 7 days Pt will go Supine/Side to Sit: with supervision PT Goal: Supine/Side to Sit - Progress: Progressing toward goal Pt will go Sit to Supine/Side: with supervision PT Goal: Sit to Supine/Side - Progress: Progressing toward goal Pt will go Sit to Stand: with supervision PT Goal: Sit to Stand - Progress: Progressing toward goal Pt will go Stand to Sit: with supervision PT Goal: Stand to Sit - Progress: Progressing toward goal Pt will Ambulate: 51 - 150 feet;with supervision;with rolling walker PT Goal: Ambulate - Progress: Progressing toward goal  PT Treatment Precautions/Restrictions  Precautions Precautions: Knee Required Braces or Orthoses: Yes Knee Immobilizer: Discontinue once straight leg raise with < 10 degree lag Restrictions Weight Bearing Restrictions: No Other Position/Activity Restrictions: WBAT Mobility (including Balance) Bed Mobility Supine to Sit: 4: Min assist Supine to Sit Details (indicate cue type and reason): min assist with L LE Transfers Sit to Stand: 4: Min assist;From bed;With upper extremity assist Stand to Sit: 4: Min assist;With armrests;To chair/3-in-1 Ambulation/Gait Ambulation/Gait Assistance: 4: Min assist Ambulation/Gait Assistance Details (indicate cue type and reason): cues for posture Ambulation Distance (Feet): 110 Feet Assistive device: Rolling walker Gait Pattern: Step-to pattern    Exercise  Total Joint Exercises Ankle Circles/Pumps: AROM;20 reps;Supine;Both Quad Sets: AROM;20  reps;Supine;Both Heel Slides: AAROM;Supine;20 reps;Left Straight Leg Raises: AAROM;20 reps;Supine;Left End of Session PT - End of Session Activity Tolerance: Patient tolerated treatment well Patient left: in chair;with call bell in reach;with family/visitor present General Behavior During Session: [facility] for tasks performed Cognition: [facility] for tasks performed  Terecia Plaut 09/24/2011, 1:14 PM

## 2011-09-24 NOTE — Progress Notes (Signed)
Discharged from floor via stretcher, EMTs with pt. No changes in assessment. Jwan Hornbaker  

## 2011-09-24 NOTE — Progress Notes (Signed)
Per RN, Pt ready for D/c.  Contacted facility.  Facility requesting D/c summary and FL2.  Faxed documents to facility.  Confirmed by [facility] that documents have been received.  Per [name], facility ready for Pt.  Notified RN.  Spoke with Pt and husband re: d/c.  Pt requesting transportation via ambulance and has auth from ins for ambulance transportation: [id number]  Completed all necessary paperwork for d/c.  Contacted EMS.  Pt to be d/c'd.  Providence Crosby, LCSWA Clinical Social Work [phone]

## 2011-10-10 ENCOUNTER — Ambulatory Visit: Payer: Medicare Other | Attending: Orthopedic Surgery | Admitting: Physical Therapy

## 2011-10-10 DIAGNOSIS — M25569 Pain in unspecified knee: Secondary | ICD-10-CM | POA: Insufficient documentation

## 2011-10-10 DIAGNOSIS — M25669 Stiffness of unspecified knee, not elsewhere classified: Secondary | ICD-10-CM | POA: Insufficient documentation

## 2011-10-10 DIAGNOSIS — IMO0001 Reserved for inherently not codable concepts without codable children: Secondary | ICD-10-CM | POA: Insufficient documentation

## 2011-10-12 ENCOUNTER — Ambulatory Visit: Payer: Medicare Other | Admitting: Physical Therapy

## 2011-10-14 ENCOUNTER — Ambulatory Visit: Payer: Medicare Other | Admitting: Physical Therapy

## 2011-10-17 ENCOUNTER — Ambulatory Visit: Payer: Medicare Other | Admitting: Physical Therapy

## 2011-10-19 ENCOUNTER — Ambulatory Visit: Payer: Medicare Other | Admitting: Physical Therapy

## 2011-10-21 ENCOUNTER — Ambulatory Visit: Payer: Medicare Other | Attending: Orthopaedic Surgery | Admitting: Physical Therapy

## 2011-10-21 DIAGNOSIS — M25669 Stiffness of unspecified knee, not elsewhere classified: Secondary | ICD-10-CM | POA: Insufficient documentation

## 2011-10-21 DIAGNOSIS — M25569 Pain in unspecified knee: Secondary | ICD-10-CM | POA: Insufficient documentation

## 2011-10-21 DIAGNOSIS — IMO0001 Reserved for inherently not codable concepts without codable children: Secondary | ICD-10-CM | POA: Insufficient documentation

## 2011-10-24 ENCOUNTER — Ambulatory Visit: Payer: Medicare Other | Admitting: Physical Therapy

## 2011-10-26 ENCOUNTER — Ambulatory Visit: Payer: Medicare Other | Admitting: Physical Therapy

## 2011-10-28 ENCOUNTER — Ambulatory Visit: Payer: Medicare Other | Admitting: Physical Therapy

## 2011-10-31 ENCOUNTER — Ambulatory Visit: Payer: Medicare Other | Admitting: Physical Therapy

## 2011-11-02 ENCOUNTER — Ambulatory Visit: Payer: Medicare Other | Admitting: Physical Therapy

## 2011-11-04 ENCOUNTER — Ambulatory Visit: Payer: Medicare Other | Admitting: Physical Therapy

## 2011-12-19 DIAGNOSIS — Z9889 Other specified postprocedural states: Secondary | ICD-10-CM

## 2011-12-19 HISTORY — DX: Other specified postprocedural states: Z98.890

## 2012-01-03 HISTORY — PX: HAMMER TOE SURGERY: SHX385

## 2012-01-03 HISTORY — PX: FOOT ARTHRODESIS: SHX1655

## 2012-01-09 ENCOUNTER — Encounter: Payer: Self-pay | Admitting: Family

## 2012-01-09 ENCOUNTER — Ambulatory Visit (INDEPENDENT_AMBULATORY_CARE_PROVIDER_SITE_OTHER): Payer: Medicare Other | Admitting: Family

## 2012-01-09 VITALS — BP 110/80 | HR 93 | Temp 98.3°F | Resp 16

## 2012-01-09 DIAGNOSIS — T2220XA Burn of second degree of shoulder and upper limb, except wrist and hand, unspecified site, initial encounter: Secondary | ICD-10-CM

## 2012-01-09 MED ORDER — CEPHALEXIN 500 MG PO CAPS: 500.0000 mg | ORAL_CAPSULE | Freq: Two times a day (BID) | ORAL | Status: AC

## 2012-01-09 MED ORDER — SILVER SULFADIAZINE 1 % EX CREA: TOPICAL_CREAM | Freq: Two times a day (BID) | CUTANEOUS | Status: DC
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
## 2012-01-09 NOTE — Assessment & Plan Note (Signed)
Will treat empirically with keflex.  Apply silvadene bid, cover with clean non-adherent dressing until healed.

## 2012-01-30 ENCOUNTER — Encounter: Payer: Self-pay | Admitting: Family Medicine

## 2012-01-30 ENCOUNTER — Ambulatory Visit (HOSPITAL_BASED_OUTPATIENT_CLINIC_OR_DEPARTMENT_OTHER)
Admission: RE | Admit: 2012-01-30 | Discharge: 2012-01-30 | Disposition: A | Discharge status: Home / Self Care | Payer: Medicare Other | Source: Ambulatory Visit | Attending: Family Medicine | Admitting: Family Medicine

## 2012-01-30 ENCOUNTER — Ambulatory Visit (INDEPENDENT_AMBULATORY_CARE_PROVIDER_SITE_OTHER): Payer: Medicare Other | Admitting: Family Medicine

## 2012-01-30 VITALS — BP 142/90 | HR 111 | Temp 98.4°F | Wt 155.2 lb

## 2012-01-30 DIAGNOSIS — M47817 Spondylosis without myelopathy or radiculopathy, lumbosacral region: Secondary | ICD-10-CM

## 2012-01-30 DIAGNOSIS — M5126 Other intervertebral disc displacement, lumbar region: Secondary | ICD-10-CM | POA: Insufficient documentation

## 2012-01-30 DIAGNOSIS — M545 Low back pain, unspecified: Secondary | ICD-10-CM

## 2012-01-30 DIAGNOSIS — M519 Unspecified thoracic, thoracolumbar and lumbosacral intervertebral disc disorder: Secondary | ICD-10-CM | POA: Insufficient documentation

## 2012-01-30 DIAGNOSIS — M549 Dorsalgia, unspecified: Secondary | ICD-10-CM | POA: Insufficient documentation

## 2012-01-30 MED ORDER — OXYCODONE-ACETAMINOPHEN 5-325 MG PO TABS: 1.0000 | ORAL_TABLET | Freq: Three times a day (TID) | ORAL | Status: AC | PRN

## 2012-01-30 MED ORDER — HYDROCODONE-ACETAMINOPHEN 5-500 MG PO CAPS: 1.0000 | ORAL_CAPSULE | Freq: Four times a day (QID) | ORAL | Status: AC | PRN

## 2012-01-30 MED ORDER — CYCLOBENZAPRINE HCL 10 MG PO TABS: 10.0000 mg | ORAL_TABLET | Freq: Three times a day (TID) | ORAL | Status: AC | PRN

## 2012-01-30 NOTE — Progress Notes (Signed)
  Subjective:    Madeline [name] is a 66 y.o. female who presents for evaluation of low back pain. The patient has had no prior back problems. Symptoms have been present for 6 days and are gradually worsening.  Onset was related to / precipitated by having foot surgery and walking with walker.. The pain is located in the across the lower back and radiates to the right hip. The pain is described as sharp and occurs all day. She rates her pain as excruciating. Symptoms are exacerbated by extension, flexion, sitting, standing, twisting and walking for more than a few minutes. Symptoms are improved by rest. She has also tried NSAIDs and valium which provided no symptom relief. She has no other symptoms associated with the back pain. The patient has no "red flag" history indicative of complicated back pain.  The following portions of the patient's history were reviewed and updated as appropriate: allergies, current medications, past family history, past medical history, past social history, past surgical history and problem list.  Review of Systems Pertinent items are noted in HPI.    Objective:   Inspection and palpation: inspection of back is normal. Muscle tone and ROM exam: muscle spasm noted ls spine. Straight leg raise: positive at 20-30  degrees bilaterally. Neurological: normal DTRs, muscle strength and reflexes.    Assessment:    Nonspecific acute low back pain    Plan:    Natural history and expected course discussed. Questions answered. Agricultural engineer distributed. Proper lifting, bending technique discussed. Stretching exercises discussed. Short (2-4 day) period of relative rest recommended until acute symptoms improve. Ice to affected area as needed for local pain relief. Heat to affected area as needed for local pain relief. Muscle relaxants per medication orders. xray and pain meds

## 2012-01-30 NOTE — Progress Notes (Signed)
Addended by: Arnette Norris on: 01/30/2012 05:00 PM   Modules accepted: Orders

## 2012-01-30 NOTE — Patient Instructions (Signed)
Back Pain, Adult Low back pain is very common. About 1 in 5 people have back pain.The cause of low back pain is rarely dangerous. The pain often gets better over time.About half of people with a sudden onset of back pain feel better in just 2 weeks. About 8 in 10 people feel better by 6 weeks.  CAUSES Some common causes of back pain include:  Strain of the muscles or ligaments supporting the spine.   Wear and tear (degeneration) of the spinal discs.   Arthritis.   Direct injury to the back.  DIAGNOSIS Most of the time, the direct cause of low back pain is not known.However, back pain can be treated effectively even when the exact cause of the pain is unknown.Answering your caregiver's questions about your overall health and symptoms is one of the most accurate ways to make sure the cause of your pain is not dangerous. If your caregiver needs more information, he or she may order lab work or imaging tests (X-rays or MRIs).However, even if imaging tests show changes in your back, this usually does not require surgery. HOME CARE INSTRUCTIONS For many people, back pain returns.Since low back pain is rarely dangerous, it is often a condition that people can learn to manageon their own.   Remain active. It is stressful on the back to sit or stand in one place. Do not sit, drive, or stand in one place for more than 30 minutes at a time. Take short walks on level surfaces as soon as pain allows.Try to increase the length of time you walk each day.   Do not stay in bed.Resting more than 1 or 2 days can delay your recovery.   Do not avoid exercise or work.Your body is made to move.It is not dangerous to be active, even though your back may hurt.Your back will likely heal faster if you return to being active before your pain is gone.   Pay attention to your body when you bend and lift. Many people have less discomfortwhen lifting if they bend their knees, keep the load close to their  bodies,and avoid twisting. Often, the most comfortable positions are those that put less stress on your recovering back.   Find a comfortable position to sleep. Use a firm mattress and lie on your side with your knees slightly bent. If you lie on your back, put a pillow under your knees.   Only take over-the-counter or prescription medicines as directed by your caregiver. Over-the-counter medicines to reduce pain and inflammation are often the most helpful.Your caregiver may prescribe muscle relaxant drugs.These medicines help dull your pain so you can more quickly return to your normal activities and healthy exercise.   Put ice on the injured area.   Put ice in a plastic bag.   Place a towel between your skin and the bag.   Leave the ice on for 15 to 20 minutes, 3 to 4 times a day for the first 2 to 3 days. After that, ice and heat may be alternated to reduce pain and spasms.   Ask your caregiver about trying back exercises and gentle massage. This may be of some benefit.   Avoid feeling anxious or stressed.Stress increases muscle tension and can worsen back pain.It is important to recognize when you are anxious or stressed and learn ways to manage it.Exercise is a great option.  SEEK MEDICAL CARE IF:  You have pain that is not relieved with rest or medicine.   You have   pain that does not improve in 1 week.   You have new symptoms.   You are generally not feeling well.  SEEK IMMEDIATE MEDICAL CARE IF:   You have pain that radiates from your back into your legs.   You develop new bowel or bladder control problems.   You have unusual weakness or numbness in your arms or legs.   You develop nausea or vomiting.   You develop abdominal pain.   You feel faint.  Document Released: 09/05/2005 Document Revised: 08/25/2011 Document Reviewed: 01/24/2011 Exit[employer] Patient Information 2012 [employer]. 

## 2012-02-03 ENCOUNTER — Telehealth: Payer: Self-pay | Admitting: *Deleted

## 2012-02-03 NOTE — Telephone Encounter (Signed)
Pt left VM that she had a X-ray done and she has yet to hear anything about results. Called Pt back and advised her results were mailed out and review finding with Pt.        Notes Recorded by Maurice Small, CMA on 01/31/2012 at 8:10 AM Letter mailed ------  Notes Recorded by Lelon Perla, DO on 01/30/2012 at 8:57 PM + constipation----take stool softener and Miralax  If she is no better at all in a week or if she is worse we will try to get the MRI.

## 2012-02-27 ENCOUNTER — Ambulatory Visit: Payer: Medicare Other | Attending: Orthopedic Surgery | Admitting: Physical Therapy

## 2012-02-27 DIAGNOSIS — M545 Low back pain, unspecified: Secondary | ICD-10-CM | POA: Insufficient documentation

## 2012-02-27 DIAGNOSIS — M25579 Pain in unspecified ankle and joints of unspecified foot: Secondary | ICD-10-CM | POA: Insufficient documentation

## 2012-02-27 DIAGNOSIS — R262 Difficulty in walking, not elsewhere classified: Secondary | ICD-10-CM | POA: Insufficient documentation

## 2012-02-27 DIAGNOSIS — IMO0001 Reserved for inherently not codable concepts without codable children: Secondary | ICD-10-CM | POA: Insufficient documentation

## 2012-02-29 ENCOUNTER — Ambulatory Visit: Payer: Medicare Other | Admitting: Physical Therapy

## 2012-03-05 ENCOUNTER — Ambulatory Visit: Payer: Medicare Other | Admitting: Physical Therapy

## 2012-03-09 ENCOUNTER — Ambulatory Visit: Payer: Medicare Other | Admitting: Physical Therapy

## 2012-03-12 ENCOUNTER — Ambulatory Visit: Payer: Medicare Other | Admitting: Physical Therapy

## 2012-03-14 ENCOUNTER — Ambulatory Visit: Payer: Medicare Other | Admitting: Physical Therapy

## 2012-03-16 ENCOUNTER — Ambulatory Visit: Payer: Medicare Other | Admitting: Physical Therapy

## 2012-03-19 ENCOUNTER — Ambulatory Visit: Payer: Medicare Other | Attending: Orthopedic Surgery | Admitting: Physical Therapy

## 2012-03-19 DIAGNOSIS — M545 Low back pain, unspecified: Secondary | ICD-10-CM | POA: Insufficient documentation

## 2012-03-19 DIAGNOSIS — R262 Difficulty in walking, not elsewhere classified: Secondary | ICD-10-CM | POA: Insufficient documentation

## 2012-03-19 DIAGNOSIS — M25579 Pain in unspecified ankle and joints of unspecified foot: Secondary | ICD-10-CM | POA: Insufficient documentation

## 2012-03-19 DIAGNOSIS — IMO0001 Reserved for inherently not codable concepts without codable children: Secondary | ICD-10-CM | POA: Insufficient documentation

## 2012-03-26 ENCOUNTER — Ambulatory Visit: Payer: Medicare Other | Admitting: Physical Therapy

## 2012-03-29 ENCOUNTER — Ambulatory Visit: Payer: Medicare Other | Admitting: Physical Therapy

## 2012-04-09 ENCOUNTER — Ambulatory Visit: Payer: Medicare Other | Admitting: Physical Therapy

## 2012-04-12 ENCOUNTER — Ambulatory Visit: Payer: Medicare Other | Admitting: Physical Therapy

## 2012-04-16 ENCOUNTER — Ambulatory Visit: Payer: Medicare Other | Admitting: Physical Therapy

## 2012-04-18 ENCOUNTER — Ambulatory Visit: Payer: Medicare Other | Admitting: Physical Therapy

## 2012-04-24 ENCOUNTER — Ambulatory Visit: Payer: Medicare Other | Attending: Orthopedic Surgery | Admitting: Physical Therapy

## 2012-04-24 ENCOUNTER — Ambulatory Visit (INDEPENDENT_AMBULATORY_CARE_PROVIDER_SITE_OTHER): Payer: Medicare Other | Admitting: Internal Medicine

## 2012-04-24 ENCOUNTER — Encounter: Payer: Self-pay | Admitting: Internal Medicine

## 2012-04-24 VITALS — BP 122/78 | HR 81 | Temp 98.5°F | Wt 182.0 lb

## 2012-04-24 DIAGNOSIS — I10 Essential (primary) hypertension: Secondary | ICD-10-CM

## 2012-04-24 DIAGNOSIS — IMO0002 Reserved for concepts with insufficient information to code with codable children: Secondary | ICD-10-CM

## 2012-04-24 DIAGNOSIS — D62 Acute posthemorrhagic anemia: Secondary | ICD-10-CM

## 2012-04-24 DIAGNOSIS — IMO0001 Reserved for inherently not codable concepts without codable children: Secondary | ICD-10-CM | POA: Insufficient documentation

## 2012-04-24 DIAGNOSIS — M1710 Unilateral primary osteoarthritis, unspecified knee: Secondary | ICD-10-CM

## 2012-04-24 DIAGNOSIS — E785 Hyperlipidemia, unspecified: Secondary | ICD-10-CM

## 2012-04-24 DIAGNOSIS — M1711 Unilateral primary osteoarthritis, right knee: Secondary | ICD-10-CM

## 2012-04-24 DIAGNOSIS — M545 Low back pain, unspecified: Secondary | ICD-10-CM | POA: Insufficient documentation

## 2012-04-24 DIAGNOSIS — R262 Difficulty in walking, not elsewhere classified: Secondary | ICD-10-CM | POA: Insufficient documentation

## 2012-04-24 DIAGNOSIS — M25579 Pain in unspecified ankle and joints of unspecified foot: Secondary | ICD-10-CM | POA: Insufficient documentation

## 2012-04-24 DIAGNOSIS — E871 Hypo-osmolality and hyponatremia: Secondary | ICD-10-CM

## 2012-04-24 NOTE — Progress Notes (Signed)
Subjective:    Patient ID: Madeline [name], female    DOB: 30-Oct-1945, 66 y.o.   MRN: [id number]  HPI She is scheduled for a right total knee replacement 06/25/12 by Dr. Despina Hick. She is having significant pain, up to a level 7 with ambulation. Her condition is described as "bone on bone". It also requires narcotic pain medication on occasion. She has had a total knee replacement on the left with good response. Steroid injections X 4  have not addressed the symptoms in the right knee.    Review of Systems Significantly after her left total knee she had anemia and hyponatremia. There is no past medical history of her operative bleeding dyscrasias, dysrhythmias, deep venous thrombosis, or pulmonary thromboemboli. Labs performed 03/20/12 @ [facility] were reviewed. Her hematocrit is now within normal limits at 38. Sodium is 140. Liver and kidney function studies are within normal limits.  Her blood pressures average 120/80. She denies chest pain, palpitations, dyspnea, or claudication. She has had swelling of the right foot following surgery as well as significant edema in both legs at times. This has improved.  She is compliant with her statin. She denies abdominal pain, melena, or rectal bleeding. She does have constipation only with narcotic pain meds. Her myalgias are related to fibromyalgia which is variable with "good days and bad days".    Objective:   Physical Exam Gen.: Healthy and well-nourished in appearance. Alert, appropriate and cooperative throughout exam.  Eyes: No corneal or conjunctival inflammation noted. No icterus. Nose: External nasal exam reveals no deformity or inflammation. Nasal mucosa are pink and moist. No lesions or exudates noted.  Mouth: Oral mucosa and oropharynx reveal no lesions or exudates. Teeth in good repair. Neck: No deformities, masses, or tenderness noted. Range of motion & Thyroid normal. Lungs: Normal respiratory effort; chest expands symmetrically. Lungs are clear  to auscultation without rales, wheezes, or increased work of breathing. Heart: Normal rate and rhythm. Normal S1 and S2. No gallop, click, or rub. S4 w/o murmur. Abdomen: Bowel sounds normal; abdomen soft and nontender. No masses, organomegaly or hernias noted.                                                                      Musculoskeletal/extremities: No deformity or scoliosis noted of  the thoracic or lumbar spine. No clubbing, cyanosis, edema, or deformity noted. Marked crepitus of the right knee with some decreased range of motion in both knees. No effusion present .Tone & strength  normal. Nail health  good. Vascular: Carotid, radial artery, dorsalis pedis and  posterior tibial pulses are full and equal. No bruits present. Neurologic: Alert and oriented x3.  Skin: Intact without suspicious lesions or rashes. Lymph: No cervical, axillary lymphadenopathy present. Psych: Mood and affect are normal. 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 Pain medications have impacted bowel function.  #2 anemia and hyponatremia postop left total knee replacement. These are now normal  #3 see problem list with updates.  She is medically cleared for surgery.

## 2012-04-24 NOTE — Assessment & Plan Note (Signed)
Lipids were performed at [facility] and were at goal as per her history. Liver functions are normal. I have requested a  copy of those lipids for inclusion in her chart

## 2012-04-24 NOTE — Patient Instructions (Addendum)
You're medically cleared for the total knee replacement.Review and correct the record as indicated. Please share record with all medical staff seen.

## 2012-04-24 NOTE — Assessment & Plan Note (Signed)
Blood pressure is well controlled and renal function is normal. No change indicated 

## 2012-04-27 ENCOUNTER — Ambulatory Visit: Payer: Medicare Other | Admitting: Physical Therapy

## 2012-05-01 ENCOUNTER — Ambulatory Visit: Payer: Medicare Other | Admitting: Physical Therapy

## 2012-05-04 ENCOUNTER — Ambulatory Visit: Payer: Medicare Other | Admitting: Physical Therapy

## 2012-05-20 LAB — HM PAP SMEAR: HM Pap smear: NEGATIVE

## 2012-06-11 ENCOUNTER — Ambulatory Visit (INDEPENDENT_AMBULATORY_CARE_PROVIDER_SITE_OTHER): Payer: Medicare Other

## 2012-06-11 DIAGNOSIS — Z23 Encounter for immunization: Secondary | ICD-10-CM

## 2012-06-12 ENCOUNTER — Other Ambulatory Visit: Payer: Self-pay | Admitting: Obstetrics and Gynecology

## 2012-06-13 ENCOUNTER — Encounter (HOSPITAL_COMMUNITY): Payer: Self-pay | Admitting: Pharmacy Technician

## 2012-06-14 NOTE — Progress Notes (Signed)
Please enter orders in epic pre op is 06-15-2012 thanks

## 2012-06-15 ENCOUNTER — Encounter (HOSPITAL_COMMUNITY): Payer: Self-pay

## 2012-06-15 ENCOUNTER — Encounter (HOSPITAL_COMMUNITY)
Admission: RE | Admit: 2012-06-15 | Discharge: 2012-06-15 | Disposition: A | Discharge status: Home / Self Care | Payer: Medicare Other | Source: Ambulatory Visit | Attending: Orthopedic Surgery | Admitting: Orthopedic Surgery

## 2012-06-15 HISTORY — DX: Anxiety disorder, unspecified: F41.9

## 2012-06-15 LAB — URINALYSIS, ROUTINE W REFLEX MICROSCOPIC
Bilirubin Urine: NEGATIVE
Glucose, UA: NEGATIVE mg/dL
Ketones, ur: NEGATIVE mg/dL
Leukocytes, UA: NEGATIVE
Specific Gravity, Urine: 1.03 (ref 1.005–1.030)
pH: 6 (ref 5.0–8.0)

## 2012-06-15 LAB — COMPREHENSIVE METABOLIC PANEL
ALT: 12 U/L (ref 0–35)
AST: 20 U/L (ref 0–37)
Alkaline Phosphatase: 100 U/L (ref 39–117)
CO2: 30 mEq/L (ref 19–32)
Chloride: 101 mEq/L (ref 96–112)
GFR calc Af Amer: >90 mL/min (ref >90)
GFR calc non Af Amer: >90 mL/min (ref >90)
Glucose, Bld: 97 mg/dL (ref 70–99)
Potassium: 3.8 mEq/L (ref 3.5–5.1)
Sodium: 138 mEq/L (ref 135–145)

## 2012-06-15 LAB — CBC
Hemoglobin: 12.6 g/dL (ref 12.0–15.0)
MCH: 28.8 pg (ref 26.0–34.0)
Platelets: 286 10*3/uL (ref 150–400)
RBC: 4.37 MIL/uL (ref 3.87–5.11)
WBC: 7.4 10*3/uL (ref 4.0–10.5)

## 2012-06-15 LAB — SURGICAL PCR SCREEN
MRSA, PCR: NEGATIVE
Staphylococcus aureus: POSITIVE — AB

## 2012-06-15 NOTE — Patient Instructions (Addendum)
20 BRIANNI MANTHE  06/15/2012   Your procedure is scheduled on:  06-25-2012  Report to [facility] at  1000 AM.  Call this number if you have problems the morning of surgery: [phone]   Remember:   Do not eat food or drink liquids:After Midnight.  .  Take these medicines the morning of surgery with A SIP OF WATER:cymbalta, nexium  Do not wear jewelry or make up.  Do not wear lotions, powders, or perfumes. You may wear deodorant.    Do not bring valuables to the hospital.  Contacts, dentures or bridgework may not be worn into surgery.  Leave suitcase in the car. After surgery it may be brought to your room.  For patients admitted to the hospital, checkout time is 11:00 AM the day of discharge                             Patients discharged the day of surgery will not be allowed to drive home. If going home same day of surgery, you must have someone stay with you the first 24 hours at home and arrange for some one to drive you home from hospital.    Special Instructions: See [facility] Preparing for Surgery instruction sheet. Women do not shave legs or underarms for 12 hours before showers. Men may shave face morning of surgery.    Please read over the following fact sheets that you were given: MRSA Information, blood fact sheet, incentive spirometer fact sheet  [facility] pre op nurse phone number [phone], call if needed

## 2012-06-20 ENCOUNTER — Other Ambulatory Visit: Payer: Self-pay | Admitting: Orthopedic Surgery

## 2012-06-20 MED ORDER — BUPIVACAINE 0.25 % ON-Q PUMP SINGLE CATH 300ML: 300.0000 mL | INJECTION | Status: DC

## 2012-06-20 MED ORDER — DEXAMETHASONE SODIUM PHOSPHATE 10 MG/ML IJ SOLN: 10.0000 mg | Freq: Once | INTRAMUSCULAR | Status: DC

## 2012-06-20 NOTE — Progress Notes (Signed)
Preoperative surgical orders have been place into the Epic hospital system for Madeline [name] on 06/20/2012, 1:09 PM  by Patrica Duel for surgery on 06/25/2012.  Preop Total Knee orders including Bupivacaine On-Q pump, IV Tylenol, and IV Decadron as long as there are no contraindications to the above medications. Avel Peace, PA-C

## 2012-06-24 ENCOUNTER — Other Ambulatory Visit: Payer: Self-pay | Admitting: Orthopedic Surgery

## 2012-06-24 NOTE — H&P (Signed)
Madeline [name]  DOB: Jan 21, 1946 Married / Language: English / Race: Undefined Female  Date of Admission:  06/25/2012  Chief Complaint:  Right Knee Pain  History of Present Illness The patient is a 66 year old female who comes in for a preoperative History and Physical. The patient is scheduled for a right total knee arthroplasty to be performed by Dr. Gus Rankin. Aluisio, MD at [facility] on 06/25/2012. She said the left knee is doing great at this time. She has had a lot of issues in regards to back pain after her foot surgery. She is not real pleased with that right knee. She said the back pain is slowly improving. With regards to the left knee, it feels great. She is not having any problems. The right knee continues to be a problem and is now at a point where she is ready to get it replaced now. They have been treated conservatively in the past for the above stated problem and despite conservative measures, they continue to have progressive pain and severe functional limitations and dysfunction. They have failed non-operative management. It is felt that they would benefit from undergoing total joint replacement. Risks and benefits of the procedure have been discussed with the patient and they elect to proceed with surgery. There are no active contraindications to surgery such as ongoing infection or rapidly progressive neurological disease.    Problem List/Past Medical Streptococcus Infections. 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 11/30/2010 Rubella Fibromyalgia Osteoarthritis Heart murmur Gastroesophageal Reflux Disease High blood pressure Migraine  Headache Hypercholesterolemia Impaired Vision   Allergies Dyes. IVP No Known Drug Allergies. 19-Dec-202013   Family History Heart Disease. grandmother mothers side, grandfather mothers side, grandmother fathers side and grandfather fathers side Osteoporosis. mother Osteoarthritis. mother, father and grandmother mothers side Heart disease in female family member before age 8 Mother. Living, Hypertension. Age 54, Macular Degeneration Father. Deceased. Age 36,Aneurysm of Aorta and Femoral Art., Histiry of dissection(aortic). Hypertension. father   Social History Illicit drug use. no Current work status. 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 2-3 Tobacco / smoke exposure. no Living situation. live with spouse Drug/Alcohol Rehab (Currently). no Children. 2 Alcohol use. current drinker; drinks wine; only occasionally per week Exercise. Exercises never Tobacco use. Never smoker. never smoker Marital status. married Pain Contract. no Post-Surgical Plans. Plans for [facility] Advance Directives. Living Will   Medication History Citracal Calcium+D (600-40-500MG -MG-UNIT Tablet ER 24HR, Oral) Active. TraMADol HCl (50MG  Tablet, Oral) Active. Centrum Silver ( Oral daily) Active. Cymbalta (20MG  Capsule DR Part, Oral two times daily) Active. NexIUM (40MG  Capsule DR, Oral every morning) Active. Meloxicam (7.5MG  Tablet, Oral every morning) Active. Aspirin EC (81MG  Tablet DR, Oral at bedtime) Active. Cyclobenzaprine HCl (10MG  Tablet, 2 Oral at bedtime) Active. Diltiazem HCl CR (180MG  Capsule ER 24HR, Oral at bedtime) Active. Fosinopril Sodium (10MG  Tablet, Oral at bedtime) Active. Pravastatin Sodium (40MG  Tablet, Oral at bedtime) Active. TiZANidine HCl (2MG  Tablet, Oral at bedtime) Active.   Past Surgical History Dilation and Curettage of Uterus Sinus Surgery A/P Repair and Bladder Sling Surgery. Date:  2006. Hysterectomy. Date: 67. complete (non-cancerous) Appendectomy. Date: 26. Tonsillectomy. Date: 89. Total Knee Replacement - Left. Date: 09/21/2011. Right Foot - Hallux MP joint arthrodesis, hammertoe  corrections. Date: 01/03/2012.  Review of Systems: General: No chills, fevers, night sweats, fatigue. Neuro:  No seizures, syncope, paralysis, visual problems. Respiratory:  No shortness of breath, productive cough, hemoptysis. Cardiovascular: No chest pain, angina, palpitations, orthopnea. GI: No nausea, vomiting, diarrhea.  She does have constipation. GU:  No dysuria, hematuria, discharge.  She does have nocturia. Musculoskeletal:  Joint pain and swelling  Vitals Weight: 186 lb Height: 68 in Body Surface Area: 2.01 m Body Mass Index: 28.28 kg/m Pulse: 76 (Regular) Resp.: 12 (Unlabored) BP: 128/78 (Sitting, Left Arm, Standard)    Physical Exam The physical exam findings are as follows:   General Mental Status - Alert, cooperative and good historian. General Appearance- pleasant. Not in acute distress. Orientation- Oriented X3. Build & Nutrition- Well nourished and Well developed.   Head and Neck Head- normocephalic, atraumatic . Neck Global Assessment- supple. no bruit auscultated on the right and no bruit auscultated on the left.   Eye Pupil- Bilateral- Regular and Round. Note: wears glasses Motion- Bilateral- EOMI.   Chest and Lung Exam Auscultation: Breath sounds:- clear at anterior chest wall and - clear at posterior chest wall. Adventitious sounds:- No Adventitious sounds.   Cardiovascular Auscultation:Rhythm- Regular rate and rhythm. Heart Sounds- S1 WNL and S2 WNL. Murmurs & Other Heart Sounds:Auscultation of the heart reveals - No Murmurs.   Abdomen Palpation/Percussion:Tenderness- Abdomen is non-tender to palpation. Rigidity (guarding)- Abdomen is soft. Auscultation:Auscultation of the abdomen  reveals - Bowel sounds normal.   Female Genitourinary Not done, not pertinent to present illness  Musculoskeletal Well developed female, alert and oriented in no apparent distress. Both hips with normal range of motion, no discomfort. Right knees show slight varus. Right knee range about 5 to 125. There is marked crepitus on range of motion. She is tender medial greater than lateral. There is no instability in the knee. Pulse, sensation, and motor are intact.  RADIOGRAPHS: Two views of the right knee were taken today. She has complete loss of the joint space in the medial compartment with bone on bone arthritis. She also has tibial and femoral spurring. She's actually grooving into the most medial side of the medial condyle. Lateral view also confirms arthritic changes with posterior tibial and posterior femoral spurring, also some degenerative changes noted behind the patella.  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 Lequita Halt.  Plan is to go back to [facility] after the surgery.  Signed electronically by Roberts Gaudy, PA-C

## 2012-06-25 ENCOUNTER — Encounter (HOSPITAL_COMMUNITY): Payer: Self-pay | Admitting: *Deleted

## 2012-06-25 ENCOUNTER — Encounter (HOSPITAL_COMMUNITY)
Admission: RE | Disposition: A | Discharge status: Skilled Nursing Facility | Payer: Self-pay | Source: Ambulatory Visit | Attending: Orthopedic Surgery

## 2012-06-25 ENCOUNTER — Inpatient Hospital Stay (HOSPITAL_COMMUNITY)
Admission: RE | Admit: 2012-06-25 | Discharge: 2012-06-28 | DRG: 470 | Disposition: A | Discharge status: Skilled Nursing Facility | Payer: Medicare Other | Source: Ambulatory Visit | Attending: Orthopedic Surgery | Admitting: Orthopedic Surgery

## 2012-06-25 ENCOUNTER — Encounter (HOSPITAL_COMMUNITY): Payer: Self-pay | Admitting: Anesthesiology

## 2012-06-25 ENCOUNTER — Inpatient Hospital Stay (HOSPITAL_COMMUNITY): Payer: Medicare Other | Admitting: Anesthesiology

## 2012-06-25 DIAGNOSIS — Z90710 Acquired absence of both cervix and uterus: Secondary | ICD-10-CM

## 2012-06-25 DIAGNOSIS — M899 Disorder of bone, unspecified: Secondary | ICD-10-CM | POA: Diagnosis present

## 2012-06-25 DIAGNOSIS — D62 Acute posthemorrhagic anemia: Secondary | ICD-10-CM | POA: Diagnosis not present

## 2012-06-25 DIAGNOSIS — Z9089 Acquired absence of other organs: Secondary | ICD-10-CM

## 2012-06-25 DIAGNOSIS — F411 Generalized anxiety disorder: Secondary | ICD-10-CM | POA: Diagnosis present

## 2012-06-25 DIAGNOSIS — I10 Essential (primary) hypertension: Secondary | ICD-10-CM | POA: Diagnosis present

## 2012-06-25 DIAGNOSIS — Z91041 Radiographic dye allergy status: Secondary | ICD-10-CM

## 2012-06-25 DIAGNOSIS — Z79899 Other long term (current) drug therapy: Secondary | ICD-10-CM

## 2012-06-25 DIAGNOSIS — IMO0001 Reserved for inherently not codable concepts without codable children: Secondary | ICD-10-CM | POA: Diagnosis present

## 2012-06-25 DIAGNOSIS — Z7982 Long term (current) use of aspirin: Secondary | ICD-10-CM

## 2012-06-25 DIAGNOSIS — Z96659 Presence of unspecified artificial knee joint: Secondary | ICD-10-CM

## 2012-06-25 DIAGNOSIS — M1710 Unilateral primary osteoarthritis, unspecified knee: Secondary | ICD-10-CM

## 2012-06-25 DIAGNOSIS — K219 Gastro-esophageal reflux disease without esophagitis: Secondary | ICD-10-CM | POA: Diagnosis present

## 2012-06-25 DIAGNOSIS — M179 Osteoarthritis of knee, unspecified: Secondary | ICD-10-CM | POA: Diagnosis present

## 2012-06-25 DIAGNOSIS — E871 Hypo-osmolality and hyponatremia: Secondary | ICD-10-CM | POA: Diagnosis not present

## 2012-06-25 DIAGNOSIS — E785 Hyperlipidemia, unspecified: Secondary | ICD-10-CM | POA: Diagnosis present

## 2012-06-25 HISTORY — DX: Presence of unspecified artificial knee joint: Z96.659

## 2012-06-25 HISTORY — PX: TOTAL KNEE ARTHROPLASTY: SHX125

## 2012-06-25 LAB — TYPE AND SCREEN

## 2012-06-25 SURGERY — ARTHROPLASTY, KNEE, TOTAL
Anesthesia: General | Site: Knee | Laterality: Right | Wound class: Clean

## 2012-06-25 MED ORDER — DOCUSATE SODIUM 100 MG PO CAPS
100.0000 mg | ORAL_CAPSULE | Freq: Two times a day (BID) | ORAL | Status: DC
  Administered 2012-06-25 – 2012-06-28 (×6): 100 mg via ORAL

## 2012-06-25 MED ORDER — MIDAZOLAM HCL 5 MG/5ML IJ SOLN
INTRAMUSCULAR | Status: DC | PRN
  Administered 2012-06-25: 1 mg via INTRAVENOUS

## 2012-06-25 MED ORDER — ATORVASTATIN CALCIUM 10 MG PO TABS
10.0000 mg | ORAL_TABLET | Freq: Every day | ORAL | Status: DC
  Administered 2012-06-25 – 2012-06-27 (×3): 10 mg via ORAL
  Filled 2012-06-25 (×4): qty 1

## 2012-06-25 MED ORDER — CEFAZOLIN SODIUM-DEXTROSE 2-3 GM-% IV SOLR
2.0000 g | INTRAVENOUS | Status: AC
  Administered 2012-06-25: 2 g via INTRAVENOUS

## 2012-06-25 MED ORDER — PANTOPRAZOLE SODIUM 40 MG PO TBEC
80.0000 mg | DELAYED_RELEASE_TABLET | Freq: Every day | ORAL | Status: DC
  Administered 2012-06-26: 80 mg via ORAL
  Filled 2012-06-25 (×2): qty 2

## 2012-06-25 MED ORDER — CHLORHEXIDINE GLUCONATE 4 % EX LIQD
60.0000 mL | Freq: Once | CUTANEOUS | Status: DC
  Filled 2012-06-25: qty 60

## 2012-06-25 MED ORDER — NEOSTIGMINE METHYLSULFATE 1 MG/ML IJ SOLN
INTRAMUSCULAR | Status: DC | PRN
  Administered 2012-06-25: 3 mg via INTRAVENOUS

## 2012-06-25 MED ORDER — ACETAMINOPHEN 10 MG/ML IV SOLN
INTRAVENOUS | Status: DC | PRN
  Administered 2012-06-25: 1000 mg via INTRAVENOUS

## 2012-06-25 MED ORDER — ACETAMINOPHEN 325 MG PO TABS: 650.0000 mg | ORAL_TABLET | Freq: Four times a day (QID) | ORAL | Status: DC | PRN

## 2012-06-25 MED ORDER — ACETAMINOPHEN 10 MG/ML IV SOLN
1000.0000 mg | Freq: Four times a day (QID) | INTRAVENOUS | Status: AC
  Administered 2012-06-25 – 2012-06-26 (×4): 1000 mg via INTRAVENOUS
  Filled 2012-06-25 (×5): qty 100

## 2012-06-25 MED ORDER — DULOXETINE HCL 20 MG PO CPEP
20.0000 mg | ORAL_CAPSULE | Freq: Two times a day (BID) | ORAL | Status: DC
  Administered 2012-06-25 – 2012-06-28 (×6): 20 mg via ORAL
  Filled 2012-06-25 (×7): qty 1

## 2012-06-25 MED ORDER — CISATRACURIUM BESYLATE (PF) 10 MG/5ML IV SOLN
INTRAVENOUS | Status: DC | PRN
  Administered 2012-06-25: 10 mg via INTRAVENOUS

## 2012-06-25 MED ORDER — CEFAZOLIN SODIUM 1-5 GM-% IV SOLN
1.0000 g | Freq: Four times a day (QID) | INTRAVENOUS | Status: AC
  Administered 2012-06-25 – 2012-06-26 (×2): 1 g via INTRAVENOUS
  Filled 2012-06-25 (×2): qty 50

## 2012-06-25 MED ORDER — LACTATED RINGERS IV SOLN
INTRAVENOUS | Status: DC
  Administered 2012-06-25: 1000 mL via INTRAVENOUS

## 2012-06-25 MED ORDER — PROMETHAZINE HCL 25 MG/ML IJ SOLN: 6.2500 mg | INTRAMUSCULAR | Status: DC | PRN

## 2012-06-25 MED ORDER — ESOMEPRAZOLE MAGNESIUM 40 MG PO PACK: 40.0000 mg | PACK | Freq: Every day | ORAL | Status: DC

## 2012-06-25 MED ORDER — DIPHENHYDRAMINE HCL 12.5 MG/5ML PO ELIX
12.5000 mg | ORAL_SOLUTION | ORAL | Status: DC | PRN
  Administered 2012-06-26 – 2012-06-27 (×3): 12.5 mg via ORAL
  Administered 2012-06-27: 25 mg via ORAL
  Administered 2012-06-28: 12.5 mg via ORAL
  Filled 2012-06-25: qty 10
  Filled 2012-06-25 (×4): qty 5

## 2012-06-25 MED ORDER — BUPIVACAINE ON-Q PAIN PUMP (FOR ORDER SET NO CHG)
INJECTION | Status: DC
  Filled 2012-06-25: qty 1

## 2012-06-25 MED ORDER — FLEET ENEMA 7-19 GM/118ML RE ENEM: 1.0000 | ENEMA | Freq: Once | RECTAL | Status: AC | PRN

## 2012-06-25 MED ORDER — ONDANSETRON HCL 4 MG/2ML IJ SOLN
INTRAMUSCULAR | Status: DC | PRN
  Administered 2012-06-25 (×2): 2 mg via INTRAVENOUS

## 2012-06-25 MED ORDER — ACETAMINOPHEN 650 MG RE SUPP: 650.0000 mg | Freq: Four times a day (QID) | RECTAL | Status: DC | PRN

## 2012-06-25 MED ORDER — PREGABALIN 75 MG PO CAPS
75.0000 mg | ORAL_CAPSULE | Freq: Every day | ORAL | Status: DC
  Administered 2012-06-25 – 2012-06-27 (×3): 75 mg via ORAL
  Filled 2012-06-25 (×3): qty 1

## 2012-06-25 MED ORDER — ONDANSETRON HCL 4 MG/2ML IJ SOLN: 4.0000 mg | Freq: Four times a day (QID) | INTRAMUSCULAR | Status: DC | PRN

## 2012-06-25 MED ORDER — BUPIVACAINE 0.25 % ON-Q PUMP SINGLE CATH 100 ML
INJECTION | Status: DC | PRN
  Administered 2012-06-25: 100 mL

## 2012-06-25 MED ORDER — ONDANSETRON HCL 4 MG PO TABS: 4.0000 mg | ORAL_TABLET | Freq: Four times a day (QID) | ORAL | Status: DC | PRN

## 2012-06-25 MED ORDER — ACETAMINOPHEN 10 MG/ML IV SOLN: 1000.0000 mg | Freq: Once | INTRAVENOUS | Status: DC

## 2012-06-25 MED ORDER — PHENOL 1.4 % MT LIQD: 1.0000 | OROMUCOSAL | Status: DC | PRN

## 2012-06-25 MED ORDER — SIMVASTATIN 20 MG PO TABS: 20.0000 mg | ORAL_TABLET | Freq: Every day | ORAL | Status: DC

## 2012-06-25 MED ORDER — METHOCARBAMOL 500 MG PO TABS
500.0000 mg | ORAL_TABLET | Freq: Four times a day (QID) | ORAL | Status: DC | PRN
  Administered 2012-06-25 – 2012-06-28 (×7): 500 mg via ORAL
  Filled 2012-06-25 (×8): qty 1

## 2012-06-25 MED ORDER — MORPHINE SULFATE 2 MG/ML IJ SOLN
1.0000 mg | INTRAMUSCULAR | Status: DC | PRN
  Administered 2012-06-25 – 2012-06-26 (×3): 2 mg via INTRAVENOUS
  Filled 2012-06-25 (×4): qty 1

## 2012-06-25 MED ORDER — DILTIAZEM HCL ER 180 MG PO CP24
180.0000 mg | ORAL_CAPSULE | Freq: Every day | ORAL | Status: DC
  Administered 2012-06-25 – 2012-06-27 (×3): 180 mg via ORAL
  Filled 2012-06-25 (×4): qty 1

## 2012-06-25 MED ORDER — FENTANYL CITRATE 0.05 MG/ML IJ SOLN
INTRAMUSCULAR | Status: DC | PRN
  Administered 2012-06-25 (×4): 50 ug via INTRAVENOUS

## 2012-06-25 MED ORDER — PROPOFOL 10 MG/ML IV EMUL
INTRAVENOUS | Status: DC | PRN
  Administered 2012-06-25: 200 mg via INTRAVENOUS

## 2012-06-25 MED ORDER — METOCLOPRAMIDE HCL 10 MG PO TABS: 5.0000 mg | ORAL_TABLET | Freq: Three times a day (TID) | ORAL | Status: DC | PRN

## 2012-06-25 MED ORDER — METHOCARBAMOL 100 MG/ML IJ SOLN
500.0000 mg | Freq: Four times a day (QID) | INTRAVENOUS | Status: DC | PRN
  Administered 2012-06-25: 500 mg via INTRAVENOUS
  Filled 2012-06-25: qty 5

## 2012-06-25 MED ORDER — POLYETHYLENE GLYCOL 3350 17 G PO PACK
17.0000 g | PACK | Freq: Every day | ORAL | Status: DC | PRN
  Administered 2012-06-26: 17 g via ORAL

## 2012-06-25 MED ORDER — GLYCOPYRROLATE 0.2 MG/ML IJ SOLN
INTRAMUSCULAR | Status: DC | PRN
  Administered 2012-06-25: 0.4 mg via INTRAVENOUS

## 2012-06-25 MED ORDER — TRAMADOL HCL 50 MG PO TABS
50.0000 mg | ORAL_TABLET | Freq: Four times a day (QID) | ORAL | Status: DC | PRN
  Administered 2012-06-27 – 2012-06-28 (×2): 100 mg via ORAL
  Filled 2012-06-25 (×2): qty 2

## 2012-06-25 MED ORDER — RIVAROXABAN 10 MG PO TABS
10.0000 mg | ORAL_TABLET | Freq: Every day | ORAL | Status: DC
  Administered 2012-06-26 – 2012-06-28 (×3): 10 mg via ORAL
  Filled 2012-06-25 (×4): qty 1

## 2012-06-25 MED ORDER — HYDROMORPHONE HCL 2 MG PO TABS
2.0000 mg | ORAL_TABLET | ORAL | Status: DC | PRN
  Administered 2012-06-25 – 2012-06-26 (×3): 4 mg via ORAL
  Administered 2012-06-26: 2 mg via ORAL
  Administered 2012-06-26 – 2012-06-27 (×6): 4 mg via ORAL
  Administered 2012-06-27: 2 mg via ORAL
  Administered 2012-06-28: 4 mg via ORAL
  Filled 2012-06-25 (×12): qty 2

## 2012-06-25 MED ORDER — METOCLOPRAMIDE HCL 5 MG/ML IJ SOLN: 5.0000 mg | Freq: Three times a day (TID) | INTRAMUSCULAR | Status: DC | PRN

## 2012-06-25 MED ORDER — LACTATED RINGERS IV SOLN
INTRAVENOUS | Status: DC | PRN
  Administered 2012-06-25: 12:00:00 via INTRAVENOUS

## 2012-06-25 MED ORDER — MENTHOL 3 MG MT LOZG: 1.0000 | LOZENGE | OROMUCOSAL | Status: DC | PRN

## 2012-06-25 MED ORDER — LIDOCAINE HCL (CARDIAC) 20 MG/ML IV SOLN
INTRAVENOUS | Status: DC | PRN
  Administered 2012-06-25: 30 mg via INTRAVENOUS

## 2012-06-25 MED ORDER — MEPERIDINE HCL 50 MG/ML IJ SOLN: 6.2500 mg | INTRAMUSCULAR | Status: DC | PRN

## 2012-06-25 MED ORDER — HYDROMORPHONE HCL PF 1 MG/ML IJ SOLN
0.2500 mg | INTRAMUSCULAR | Status: DC | PRN
  Administered 2012-06-25 (×4): 0.5 mg via INTRAVENOUS

## 2012-06-25 MED ORDER — KCL IN DEXTROSE-NACL 20-5-0.9 MEQ/L-%-% IV SOLN
INTRAVENOUS | Status: DC
  Filled 2012-06-25 (×2): qty 1000

## 2012-06-25 MED ORDER — HYDROMORPHONE HCL PF 1 MG/ML IJ SOLN
INTRAMUSCULAR | Status: DC | PRN
  Administered 2012-06-25 (×4): 0.5 mg via INTRAVENOUS

## 2012-06-25 MED ORDER — FENTANYL CITRATE 0.05 MG/ML IJ SOLN
50.0000 ug | INTRAMUSCULAR | Status: DC | PRN
  Administered 2012-06-25 (×2): 50 ug via INTRAVENOUS

## 2012-06-25 MED ORDER — SODIUM CHLORIDE 0.9 % IV SOLN: INTRAVENOUS | Status: DC

## 2012-06-25 MED ORDER — BISACODYL 10 MG RE SUPP: 10.0000 mg | Freq: Every day | RECTAL | Status: DC | PRN

## 2012-06-25 SURGICAL SUPPLY — 56 items
BAG ZIPLOCK 12X15 (MISCELLANEOUS) ×2 IMPLANT
BANDAGE ELASTIC 6 VELCRO ST LF (GAUZE/BANDAGES/DRESSINGS) ×2 IMPLANT
BANDAGE ESMARK 6X9 LF (GAUZE/BANDAGES/DRESSINGS) ×1 IMPLANT
BLADE SAG 18X100X1.27 (BLADE) ×2 IMPLANT
BLADE SAW SGTL 11.0X1.19X90.0M (BLADE) ×2 IMPLANT
BNDG ESMARK 6X9 LF (GAUZE/BANDAGES/DRESSINGS) ×2
BOWL SMART MIX CTS (DISPOSABLE) ×2 IMPLANT
CATH KIT ON-Q SILVERSOAK 5IN (CATHETERS) ×2 IMPLANT
CEMENT HV SMART SET (Cement) ×4 IMPLANT
CLOTH BEACON ORANGE TIMEOUT ST (SAFETY) ×2 IMPLANT
CLSR STERI-STRIP ANTIMIC 1/2X4 (GAUZE/BANDAGES/DRESSINGS) ×2 IMPLANT
CUFF TOURN SGL QUICK 34 (TOURNIQUET CUFF) ×1
CUFF TRNQT CYL 34X4X40X1 (TOURNIQUET CUFF) ×1 IMPLANT
DRAPE EXTREMITY T 121X128X90 (DRAPE) ×2 IMPLANT
DRAPE POUCH INSTRU U-SHP 10X18 (DRAPES) ×2 IMPLANT
DRAPE U-SHAPE 47X51 STRL (DRAPES) ×2 IMPLANT
DRSG ADAPTIC 3X8 NADH LF (GAUZE/BANDAGES/DRESSINGS) ×2 IMPLANT
DURAPREP 26ML APPLICATOR (WOUND CARE) ×2 IMPLANT
ELECT REM PT RETURN 9FT ADLT (ELECTROSURGICAL) ×2
ELECTRODE REM PT RTRN 9FT ADLT (ELECTROSURGICAL) ×1 IMPLANT
EVACUATOR 1/8 PVC DRAIN (DRAIN) ×2 IMPLANT
FACESHIELD LNG OPTICON STERILE (SAFETY) ×10 IMPLANT
GLOVE BIO SURGEON STRL SZ 6.5 (GLOVE) ×2 IMPLANT
GLOVE BIO SURGEON STRL SZ7.5 (GLOVE) ×2 IMPLANT
GLOVE BIO SURGEON STRL SZ8 (GLOVE) ×2 IMPLANT
GLOVE BIOGEL PI IND STRL 7.5 (GLOVE) ×1 IMPLANT
GLOVE BIOGEL PI IND STRL 8 (GLOVE) ×2 IMPLANT
GLOVE BIOGEL PI INDICATOR 7.5 (GLOVE) ×1
GLOVE BIOGEL PI INDICATOR 8 (GLOVE) ×2
GLOVE ECLIPSE 8.0 STRL XLNG CF (GLOVE) ×2 IMPLANT
GLOVE INDICATOR 6.5 STRL GRN (GLOVE) ×2 IMPLANT
GLOVE SURG SS PI 6.5 STRL IVOR (GLOVE) ×4 IMPLANT
GOWN STRL NON-REIN LRG LVL3 (GOWN DISPOSABLE) ×4 IMPLANT
GOWN STRL REIN XL XLG (GOWN DISPOSABLE) ×2 IMPLANT
HANDPIECE INTERPULSE COAX TIP (DISPOSABLE) ×2
IMMOBILIZER KNEE 20 (SOFTGOODS) ×2
IMMOBILIZER KNEE 20 THIGH 36 (SOFTGOODS) ×1 IMPLANT
KIT BASIN OR (CUSTOM PROCEDURE TRAY) ×2 IMPLANT
MANIFOLD NEPTUNE II (INSTRUMENTS) ×2 IMPLANT
NS IRRIG 1000ML POUR BTL (IV SOLUTION) ×2 IMPLANT
PACK TOTAL JOINT (CUSTOM PROCEDURE TRAY) ×2 IMPLANT
PAD ABD 7.5X8 STRL (GAUZE/BANDAGES/DRESSINGS) ×2 IMPLANT
PADDING CAST COTTON 6X4 STRL (CAST SUPPLIES) ×2 IMPLANT
POSITIONER SURGICAL ARM (MISCELLANEOUS) ×2 IMPLANT
SET HNDPC FAN SPRY TIP SCT (DISPOSABLE) ×1 IMPLANT
SPONGE GAUZE 4X4 12PLY (GAUZE/BANDAGES/DRESSINGS) ×2 IMPLANT
STRIP CLOSURE SKIN 1/2X4 (GAUZE/BANDAGES/DRESSINGS) ×4 IMPLANT
SUCTION FRAZIER 12FR DISP (SUCTIONS) ×2 IMPLANT
SUT MNCRL AB 4-0 PS2 18 (SUTURE) ×2 IMPLANT
SUT VIC AB 2-0 CT1 27 (SUTURE) ×3
SUT VIC AB 2-0 CT1 TAPERPNT 27 (SUTURE) ×3 IMPLANT
SUT VLOC 180 0 24IN GS25 (SUTURE) ×2 IMPLANT
TOWEL OR 17X26 10 PK STRL BLUE (TOWEL DISPOSABLE) ×4 IMPLANT
TRAY FOLEY CATH 14FRSI W/METER (CATHETERS) ×2 IMPLANT
WATER STERILE IRR 1500ML POUR (IV SOLUTION) ×2 IMPLANT
WRAP KNEE MAXI GEL POST OP (GAUZE/BANDAGES/DRESSINGS) ×4 IMPLANT

## 2012-06-25 NOTE — Transfer of Care (Signed)
Immediate Anesthesia Transfer of Care Note  Patient: Madeline [name]  Procedure(s) Performed: Procedure(s) (LRB) with comments: TOTAL KNEE ARTHROPLASTY (Right)  Patient Location: PACU  Anesthesia Type: General  Level of Consciousness: awake and patient cooperative  Airway & Oxygen Therapy: Patient Spontanous Breathing and Patient connected to face mask oxygen  Post-op Assessment: Report given to PACU RN and Post -op Vital signs reviewed and stable  Post vital signs: Reviewed and stable  Complications: No apparent anesthesia complications

## 2012-06-25 NOTE — Progress Notes (Signed)
Utilization review completed.  

## 2012-06-25 NOTE — Anesthesia Postprocedure Evaluation (Signed)
  Anesthesia Post-op Note  Patient: Madeline [name]  Procedure(s) Performed: Procedure(s) (LRB): TOTAL KNEE ARTHROPLASTY (Right)  Patient Location: PACU  Anesthesia Type: General  Level of Consciousness: awake and alert   Airway and Oxygen Therapy: Patient Spontanous Breathing  Post-op Pain: mild  Post-op Assessment: Post-op Vital signs reviewed, Patient's Cardiovascular Status Stable, Respiratory Function Stable, Patent Airway and No signs of Nausea or vomiting  Post-op Vital Signs: stable  Complications: No apparent anesthesia complications

## 2012-06-25 NOTE — Anesthesia Preprocedure Evaluation (Addendum)
Anesthesia Evaluation  Patient identified by MRN, date of birth, ID band Patient awake    Reviewed: Allergy & Precautions, H&P , NPO status , Patient's Chart, lab work & pertinent test results  Airway Mallampati: II TM Distance: >3 FB Neck ROM: Full    Dental No notable dental hx.    Pulmonary neg pulmonary ROS,  breath sounds clear to auscultation  Pulmonary exam normal       Cardiovascular hypertension, Pt. on medications negative cardio ROS  Rhythm:Regular Rate:Normal     Neuro/Psych  Headaches, PSYCHIATRIC DISORDERS Anxiety negative neurological ROS  negative psych ROS   GI/Hepatic negative GI ROS, Neg liver ROS, GERD-  Medicated and Controlled,  Endo/Other  negative endocrine ROS  Renal/GU negative Renal ROS  negative genitourinary   Musculoskeletal negative musculoskeletal ROS (+) Fibromyalgia -  Abdominal   Peds negative pediatric ROS (+)  Hematology negative hematology ROS (+)   Anesthesia Other Findings Upper front cap  Reproductive/Obstetrics negative OB ROS                           Anesthesia Physical Anesthesia Plan  ASA: II  Anesthesia Plan: General   Post-op Pain Management:    Induction: Intravenous  Airway Management Planned: Oral ETT  Additional Equipment:   Intra-op Plan:   Post-operative Plan: Extubation in OR  Informed Consent: I have reviewed the patients History and Physical, chart, labs and discussed the procedure including the risks, benefits and alternatives for the proposed anesthesia with the patient or authorized representative who has indicated his/her understanding and acceptance.   Dental advisory given  Plan Discussed with: CRNA  Anesthesia Plan Comments:         Anesthesia Quick Evaluation                                   Anesthesia Evaluation  Patient identified by MRN, date of birth, ID band Patient  awake    Reviewed: Allergy & Precautions, H&P , NPO status , Patient's Chart, lab work & pertinent test results  Airway Mallampati: II TM Distance: >3 FB Neck ROM: full    Dental No notable dental hx. (+) Teeth Intact and Dental Advisory Given,    Pulmonary neg pulmonary ROS,  clear to auscultation  Pulmonary exam normal       Cardiovascular Exercise Tolerance: Good hypertension, On Medications neg cardio ROS - Valvular Problems/Murmursregular Normal    Neuro/Psych  Headaches, Negative Neurological ROS  Negative Psych ROS   GI/Hepatic negative GI ROS, Neg liver ROS, GERD-  Medicated and Controlled,  Endo/Other  Negative Endocrine ROS  Renal/GU negative Renal ROS  Genitourinary negative   Musculoskeletal  (+) Fibromyalgia -  Abdominal   Peds  Hematology negative hematology ROS (+)   Anesthesia Other Findings   Reproductive/Obstetrics negative OB ROS                         Anesthesia Physical Anesthesia Plan  ASA: II  Anesthesia Plan: General   Post-op Pain Management:    Induction: Intravenous  Airway Management Planned: Oral ETT  Additional Equipment:   Intra-op Plan:   Post-operative Plan: Extubation in OR  Informed Consent: I have reviewed the patients History and Physical, chart, labs and discussed the procedure including the risks, benefits and alternatives for the proposed anesthesia with the patient or authorized representative who  has indicated his/her understanding and acceptance.   Dental Advisory Given  Plan Discussed with: CRNA and Surgeon  Anesthesia Plan Comments:        Anesthesia Quick Evaluation

## 2012-06-25 NOTE — H&P (View-Only) (Signed)
Madeline [name]  DOB: 12/09/1945 Married / Language: English / Race: Undefined Female  Date of Admission:  06/25/2012  Chief Complaint:  Right Knee Pain  History of Present Illness The patient is a 66 year old female who comes in for a preoperative History and Physical. The patient is scheduled for a right total knee arthroplasty to be performed by Dr. Frank V. Aluisio, MD at [facility] on 06/25/2012. She said the left knee is doing great at this time. She has had a lot of issues in regards to back pain after her foot surgery. She is not real pleased with that right knee. She said the back pain is slowly improving. With regards to the left knee, it feels great. She is not having any problems. The right knee continues to be a problem and is now at a point where she is ready to get it replaced now. They have been treated conservatively in the past for the above stated problem and despite conservative measures, they continue to have progressive pain and severe functional limitations and dysfunction. They have failed non-operative management. It is felt that they would benefit from undergoing total joint replacement. Risks and benefits of the procedure have been discussed with the patient and they elect to proceed with surgery. There are no active contraindications to surgery such as ongoing infection or rapidly progressive neurological disease.    Problem List/Past Medical Streptococcus Infections. 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 11/30/2010 Rubella Fibromyalgia Osteoarthritis Heart murmur Gastroesophageal Reflux Disease High blood pressure Migraine  Headache Hypercholesterolemia Impaired Vision   Allergies Dyes. IVP No Known Drug Allergies. 03/14/2012   Family History Heart Disease. grandmother mothers side, grandfather mothers side, grandmother fathers side and grandfather fathers side Osteoporosis. mother Osteoarthritis. mother, father and grandmother mothers side Heart disease in female family member before age 65 Mother. Living, Hypertension. Age 90, Macular Degeneration Father. Deceased. Age 79,Aneurysm of Aorta and Femoral Art., Histiry of dissection(aortic). Hypertension. father   Social History Illicit drug use. no Current work status. retired Drug/Alcohol Rehab (Previously). no Number of flights of stairs before winded. 2-3 Tobacco / smoke exposure. no Living situation. live with spouse Drug/Alcohol Rehab (Currently). no Children. 2 Alcohol use. current drinker; drinks wine; only occasionally per week Exercise. Exercises never Tobacco use. Never smoker. never smoker Marital status. married Pain Contract. no Post-Surgical Plans. Plans for [facility] Advance Directives. Living Will   Medication History Citracal Calcium+D (600-40-500MG-MG-UNIT Tablet ER 24HR, Oral) Active. TraMADol HCl (50MG Tablet, Oral) Active. Centrum Silver ( Oral daily) Active. Cymbalta (20MG Capsule DR Part, Oral two times daily) Active. NexIUM (40MG Capsule DR, Oral every morning) Active. Meloxicam (7.5MG Tablet, Oral every morning) Active. Aspirin EC (81MG Tablet DR, Oral at bedtime) Active. Cyclobenzaprine HCl (10MG Tablet, 2 Oral at bedtime) Active. Diltiazem HCl CR (180MG Capsule ER 24HR, Oral at bedtime) Active. Fosinopril Sodium (10MG Tablet, Oral at bedtime) Active. Pravastatin Sodium (40MG Tablet, Oral at bedtime) Active. TiZANidine HCl (2MG Tablet, Oral at bedtime) Active.   Past Surgical History Dilation and Curettage of Uterus Sinus Surgery A/P Repair and Bladder Sling Surgery. Date:  2006. Hysterectomy. Date: 1977. complete (non-cancerous) Appendectomy. Date: 1963. Tonsillectomy. Date: 1954. Total Knee Replacement - Left. Date: 09/21/2011. Right Foot - Hallux MP joint arthrodesis, hammertoe   corrections. Date: 01/03/2012.  Review of Systems: General: No chills, fevers, night sweats, fatigue. Neuro:  No seizures, syncope, paralysis, visual problems. Respiratory:  No shortness of breath, productive cough, hemoptysis. Cardiovascular: No chest pain, angina, palpitations, orthopnea. GI: No nausea, vomiting, diarrhea.  She does have constipation. GU:  No dysuria, hematuria, discharge.  She does have nocturia. Musculoskeletal:  Joint pain and swelling  Vitals Weight: 186 lb Height: 68 in Body Surface Area: 2.01 m Body Mass Index: 28.28 kg/m Pulse: 76 (Regular) Resp.: 12 (Unlabored) BP: 128/78 (Sitting, Left Arm, Standard)    Physical Exam The physical exam findings are as follows:   General Mental Status - Alert, cooperative and good historian. General Appearance- pleasant. Not in acute distress. Orientation- Oriented X3. Build & Nutrition- Well nourished and Well developed.   Head and Neck Head- normocephalic, atraumatic . Neck Global Assessment- supple. no bruit auscultated on the right and no bruit auscultated on the left.   Eye Pupil- Bilateral- Regular and Round. Note: wears glasses Motion- Bilateral- EOMI.   Chest and Lung Exam Auscultation: Breath sounds:- clear at anterior chest wall and - clear at posterior chest wall. Adventitious sounds:- No Adventitious sounds.   Cardiovascular Auscultation:Rhythm- Regular rate and rhythm. Heart Sounds- S1 WNL and S2 WNL. Murmurs & Other Heart Sounds:Auscultation of the heart reveals - No Murmurs.   Abdomen Palpation/Percussion:Tenderness- Abdomen is non-tender to palpation. Rigidity (guarding)- Abdomen is soft. Auscultation:Auscultation of the abdomen  reveals - Bowel sounds normal.   Female Genitourinary Not done, not pertinent to present illness  Musculoskeletal Well developed female, alert and oriented in no apparent distress. Both hips with normal range of motion, no discomfort. Right knees show slight varus. Right knee range about 5 to 125. There is marked crepitus on range of motion. She is tender medial greater than lateral. There is no instability in the knee. Pulse, sensation, and motor are intact.  RADIOGRAPHS: Two views of the right knee were taken today. She has complete loss of the joint space in the medial compartment with bone on bone arthritis. She also has tibial and femoral spurring. She's actually grooving into the most medial side of the medial condyle. Lateral view also confirms arthritic changes with posterior tibial and posterior femoral spurring, also some degenerative changes noted behind the patella.  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 Aluisio.  Plan is to go back to [facility] after the surgery.  Signed electronically by DREW L Mandalyn Pasqua, PA-C      

## 2012-06-25 NOTE — Op Note (Signed)
Pre-operative diagnosis- Osteoarthritis  Right knee(s)  Post-operative diagnosis- Osteoarthritis Right knee(s)  Procedure-  Right  Total Knee Arthroplasty  Surgeon- Gus Rankin. Vihaan Gloss, MD  Assistant- Leilani Able, PA-C   Anesthesia-  General EBL-* No blood loss amount entered *  Drains Hemovac  Tourniquet time-  Total Tourniquet Time Documented: Thigh (Right) - 33 minutes   Complications- None  Condition-PACU - hemodynamically stable.   Brief Clinical Note  Madeline [name] is a 66 y.o. year old female with end stage OA of her right knee with progressively worsening pain and dysfunction. She has constant pain, with activity and at rest and significant functional deficits with difficulties even with ADLs. She has had extensive non-op management including analgesics, injections of cortisone and viscosupplements, and home exercise program, but remains in significant pain with significant dysfunction.Radiographs show bone on bone arthritis medial and patellofemoral. She presents now for right Total Knee Arthroplasty.    Procedure in detail---   The patient is brought into the operating room and positioned supine on the operating table. After successful administration of  General,   a tourniquet is placed high on the  Right thigh(s) and the lower extremity is prepped and draped in the usual sterile fashion. Time out is performed by the operating team and then the  Right lower extremity is wrapped in Esmarch, knee flexed and the tourniquet inflated to 300 mmHg.       A midline incision is made with a ten blade through the subcutaneous tissue to the level of the extensor mechanism. A fresh blade is used to make a medial parapatellar arthrotomy. Soft tissue over the proximal medial tibia is subperiosteally elevated to the joint line with a knife and into the semimembranosus bursa with a Cobb elevator. Soft tissue over the proximal lateral tibia is elevated with attention being paid to avoiding the  patellar tendon on the tibial tubercle. The patella is everted, knee flexed 90 degrees and the ACL and PCL are removed. Findings are bone on bone medial and patellofemoral  With large medial osteophytes.       The drill is used to create a starting hole in the distal femur and the canal is thoroughly irrigated with sterile saline to remove the fatty contents. The 5 degree Right  valgus alignment guide is placed into the femoral canal and the distal femoral cutting block is pinned to remove 10 mm off the distal femur. Resection is made with an oscillating saw.      The tibia is subluxed forward and the menisci are removed. The extramedullary alignment guide is placed referencing proximally at the medial aspect of the tibial tubercle and distally along the second metatarsal axis and tibial crest. The block is pinned to remove 2mm off the more deficient medial  side. Resection is made with an oscillating saw. Size 4is the most appropriate size for the tibia and the proximal tibia is prepared with the modular drill and keel punch for that size.      The femoral sizing guide is placed and size 4 narrow is most appropriate. Rotation is marked off the epicondylar axis and confirmed by creating a rectangular flexion gap at 90 degrees. The size 4 cutting block is pinned in this rotation and the anterior, posterior and chamfer cuts are made with the oscillating saw. The intercondylar block is then placed and that cut is made.      Trial size 4 tibial component, trial size 4 narrow posterior stabilized femur and a 12.5  mm posterior stabilized rotating platform insert trial is placed. Full extension is achieved with excellent varus/valgus and anterior/posterior balance throughout full range of motion. The patella is everted and thickness measured to be 22  mm. Free hand resection is taken to 12 mm, a 35 template is placed, lug holes are drilled, trial patella is placed, and it tracks normally. Osteophytes are removed off  the posterior femur with the trial in place. All trials are removed and the cut bone surfaces prepared with pulsatile lavage. Cement is mixed and once ready for implantation, the size 4 tibial implant, size  4 posterior stabilized femoral component, and the size 35 patella are cemented in place and the patella is held with the clamp. The trial insert is placed and the knee held in full extension. All extruded cement is removed and once the cement is hard the permanent 12.5 mm posterior stabilized rotating platform insert is placed into the tibial tray.      The wound is copiously irrigated with saline solution and the extensor mechanism closed over a hemovac drain with #1 PDS suture. The tourniquet is released for a total tourniquet time of 33  minutes. Flexion against gravity is 140 degrees and the patella tracks normally. Subcutaneous tissue is closed with 2.0 vicryl and subcuticular with running 4.0 Monocryl. The catheter for the Marcaine pain pump is placed and the pump is initiated. The incision is cleaned and dried and steri-strips and a bulky sterile dressing are applied. The limb is placed into a knee immobilizer and the patient is awakened and transported to recovery in stable condition.      Please note that a surgical assistant was a medical necessity for this procedure in order to perform it in a safe and expeditious manner. Surgical assistant was necessary to retract the ligaments and vital neurovascular structures to prevent injury to them and also necessary for proper positioning of the limb to allow for anatomic placement of the prosthesis.   Gus Rankin Altheria Shadoan, MD    06/25/2012, 12:47 PM

## 2012-06-25 NOTE — Interval H&P Note (Signed)
History and Physical Interval Note:  06/25/2012 10:41 AM  Madeline [name]  has presented today for surgery, with the diagnosis of Osteoarthritis of the Right Knee  The various methods of treatment have been discussed with the patient and family. After consideration of risks, benefits and other options for treatment, the patient has consented to  Procedure(s) (LRB) with comments: TOTAL KNEE ARTHROPLASTY (Right) as a surgical intervention .  The patient's history has been reviewed, patient examined, no change in status, stable for surgery.  I have reviewed the patient's chart and labs.  Questions were answered to the patient's satisfaction.     Loanne Drilling

## 2012-06-26 LAB — CBC
MCH: 29.5 pg (ref 26.0–34.0)
MCHC: 33.3 g/dL (ref 30.0–36.0)
MCV: 88.5 fL (ref 78.0–100.0)
Platelets: 212 10*3/uL (ref 150–400)
RDW: 14.3 % (ref 11.5–15.5)
WBC: 8.5 10*3/uL (ref 4.0–10.5)

## 2012-06-26 LAB — BASIC METABOLIC PANEL
Calcium: 8.3 mg/dL — ABNORMAL LOW (ref 8.4–10.5)
Chloride: 100 mEq/L (ref 96–112)
Creatinine, Ser: 0.65 mg/dL (ref 0.50–1.10)
GFR calc Af Amer: >90 mL/min (ref >90)
GFR calc non Af Amer: >90 mL/min (ref >90)

## 2012-06-26 MED ORDER — ESOMEPRAZOLE MAGNESIUM 40 MG PO CPDR
40.0000 mg | DELAYED_RELEASE_CAPSULE | Freq: Every day | ORAL | Status: DC
  Administered 2012-06-27 – 2012-06-28 (×2): 40 mg via ORAL
  Filled 2012-06-26 (×3): qty 1

## 2012-06-26 MED ORDER — NON FORMULARY: 40.0000 mg | Freq: Every day | Status: DC

## 2012-06-26 MED ORDER — POLYSACCHARIDE IRON COMPLEX 150 MG PO CAPS
150.0000 mg | ORAL_CAPSULE | Freq: Every day | ORAL | Status: DC
  Administered 2012-06-27: 150 mg via ORAL
  Filled 2012-06-26 (×3): qty 1

## 2012-06-26 NOTE — Anesthesia Postprocedure Evaluation (Signed)
  Anesthesia Post-op Note  Patient: Madeline [name]  Procedure(s) Performed: Procedure(s) (LRB): TOTAL KNEE ARTHROPLASTY (Right)  Patient Location: PACU  Anesthesia Type: General  Level of Consciousness: awake and alert   Airway and Oxygen Therapy: Patient Spontanous Breathing  Post-op Pain: mild  Post-op Assessment: Post-op Vital signs reviewed, Patient's Cardiovascular Status Stable, Respiratory Function Stable, Patent Airway and No signs of Nausea or vomiting  Post-op Vital Signs: stable  Complications: No apparent anesthesia complications 

## 2012-06-26 NOTE — Progress Notes (Signed)
Physical Therapy Treatment Patient Details Name: Madeline [name] MRN: [id number] DOB: 1945-09-26 Today's Date: 06/26/2012 Time: 4098-1191 PT Time Calculation (min): 37 min  PT Assessment / Plan / Recommendation Comments on Treatment Session       Follow Up Recommendations  Post acute inpatient     Does the patient have the potential to tolerate intense rehabilitation  No, Recommend SNF  Barriers to Discharge        Equipment Recommendations  None recommended by PT    Recommendations for Other Services OT consult  Frequency 7X/week   Plan      Precautions / Restrictions Precautions Precautions: Knee Required Braces or Orthoses: Knee Immobilizer - Right Knee Immobilizer - Right: Discontinue once straight leg raise with < 10 degree lag (Pt performed IND SLR this am) Restrictions Weight Bearing Restrictions: No Other Position/Activity Restrictions: WBAT   Pertinent Vitals/Pain 5/10 with activity; premedicated, cold packs provided    Mobility  Bed Mobility Bed Mobility: Supine to Sit Supine to Sit: 4: Min assist Details for Bed Mobility Assistance: cues for sequence and use of L LE to self assist Transfers Transfers: Sit to Stand;Stand to Sit Sit to Stand: 4: Min assist Stand to Sit: 4: Min assist Details for Transfer Assistance: cues for use of UEs and for LE management Ambulation/Gait Ambulation/Gait Assistance: 4: Min assist;3: Mod assist Ambulation Distance (Feet): 18 Feet Assistive device: Rolling walker Ambulation/Gait Assistance Details: cues for posture, sequence, and position from RW Gait Pattern: Step-to pattern    Exercises Total Joint Exercises Ankle Circles/Pumps: AROM;10 reps;Supine;Both Quad Sets: AROM;10 reps;Both;Supine Heel Slides: AAROM;10 reps;Right;Supine Straight Leg Raises: AAROM;10 reps;Supine;Right   PT Diagnosis: Difficulty walking  PT Problem List:   PT Treatment Interventions: DME instruction;Gait training;Stair training;Functional  mobility training;Therapeutic exercise;Therapeutic activities;Patient/family education   PT Goals Acute Rehab PT Goals PT Goal Formulation: With patient Time For Goal Achievement: 07/02/12 Potential to Achieve Goals: Good  Visit Information  Last PT Received On: 06/26/12 Assistance Needed: +1    Subjective Data  Subjective: The other knee is doing well but after my foot surgery I had back problems and thats not quite better Patient Stated Goal: Resume previous lifestyle with decreased pain   Cognition  Overall Cognitive Status: Appears within functional limits for tasks assessed/performed Arousal/Alertness: Awake/alert Orientation Level: Appears intact for tasks assessed Behavior During Session: [facility] for tasks performed    Balance     End of Session PT - End of Session Equipment Utilized During Treatment: Gait belt Activity Tolerance: Patient tolerated treatment well Nurse Communication: Mobility status   GP     Madeline [name] 06/26/2012, 9:02 AM

## 2012-06-26 NOTE — Progress Notes (Signed)
   Subjective: 1 Day Post-Op Procedure(s) (LRB): TOTAL KNEE ARTHROPLASTY (Right) Patient reports pain as moderate.   Patient seen in rounds with Dr. Lequita Halt. Patient is well, and has had no acute complaints or problems.  Doing better this morning as compared to last night. We will start therapy today.  Plan is to go Skilled nursing facility after hospital stay.  Objective: Vital signs in last 24 hours: Temp:  [97.8 F (36.6 C)-98.8 F (37.1 C)] 98.8 F (37.1 C) (10/08 0648) Pulse Rate:  [72-105] 79  (10/08 0648) Resp:  [12-18] 16  (10/08 0648) BP: (100-160)/(62-95) 101/64 mmHg (10/08 0648) SpO2:  [98 %-100 %] 100 % (10/08 0648) FiO2 (%):  [100 %] 100 % (10/07 1415) Weight:  [84.369 kg (186 lb)] 84.369 kg (186 lb) (10/07 1415)  Intake/Output from previous day:  Intake/Output Summary (Last 24 hours) at 06/26/12 0831 Last data filed at 06/26/12 0650  Gross per 24 hour  Intake   2605 ml  Output   1915 ml  Net    690 ml    Intake/Output this shift:    Labs:  Basename 06/26/12 0501  HGB 9.0*    Basename 06/26/12 0501  WBC 8.5  RBC 3.05*  HCT 27.0*  PLT 212    Basename 06/26/12 0501  NA 134*  K 4.0  CL 100  CO2 30  BUN 13  CREATININE 0.65  GLUCOSE 108*  CALCIUM 8.3*   No results found for this basename: LABPT:2,INR:2 in the last 72 hours  EXAM General - Patient is Alert, Appropriate and Oriented Extremity - Neurovascular intact Sensation intact distally Dorsiflexion/Plantar flexion intact Dressing - dressing C/D/I Motor Function - intact, moving foot and toes well on exam.  Hemovac pulled without difficulty.  Past Medical History  Diagnosis Date  . Hyperlipidemia   . Cystitis 2007    sepsis post bladder biopsy, Dr Annabell Howells  . Headache 09-13-11    less frequent to none at present  . TMJ click 09-13-11    right side > left  . Cataracts, both eyes 09-13-11    not surgical yet  . Hypertension 09-13-11    tx. meds  . Varicose vein 09-14-11   bilateral , with tenderness left shin bone near foot  . GERD (gastroesophageal reflux disease) 09-14-11    tx. Nexium  . Fibromyalgia 09-14-11    Dr Dian Queen, [facility]  . Arthritis 09-14-11    osteoarthritis, osteopenia  . Strep throat 09-14-11    none in a yr.  . Fractures 09-14-11    toes-left foot  . Edema 09-14-11    occ. ankles/ feet > left leg  . Heart murmur 09-13-11  . Anxiety     Assessment/Plan: 1 Day Post-Op Procedure(s) (LRB): TOTAL KNEE ARTHROPLASTY (Right) Principal Problem:  *OA (osteoarthritis) of knee Active Problems:  Mild Postop Hyponatremia  Postop Acute blood loss anemia   Advance diet Up with therapy Discharge to SNF  DVT Prophylaxis - Xarelto Weight-Bearing as tolerated to right leg No vaccines. D/C O2 and Pulse OX and try on Room [address]  Patrica Duel 06/26/2012, 8:31 AM

## 2012-06-26 NOTE — Care Management Note (Signed)
    Page 1 of 1   06/26/2012     3:11:16 PM   CARE MANAGEMENT NOTE 06/26/2012  Patient:  Madeline [name], Madeline [name]   Account Number:  [id number]  Date Initiated:  06/26/2012  Documentation initiated by:  Lorenda Ishihara  Subjective/Objective Assessment:   66 yo female admitted s/p knee surgery. PTA lived at home.     Action/Plan:   Anticipated DC Date:  06/29/2012   Anticipated DC Plan:  SKILLED NURSING FACILITY  In-house referral  Clinical Social Worker      DC Planning Services  CM consult      Choice offered to / List presented to:             Status of service:  Completed, signed off Medicare Important Message given?   (If response is "NO", the following Medicare IM given date fields will be blank) Date Medicare IM given:   Date Additional Medicare IM given:    Discharge Disposition:  SKILLED NURSING FACILITY  Per UR Regulation:  Reviewed for med. necessity/level of care/duration of stay  If discussed at Long Length of Stay Meetings, dates discussed:    Comments:

## 2012-06-26 NOTE — Progress Notes (Signed)
Clinical Social Work Department CLINICAL SOCIAL WORK PLACEMENT NOTE 06/26/2012  Patient:  Madeline [name], Madeline [name]  Account Number:  [id number] Admit date:  06/25/2012  Clinical Social Worker:  Cori Razor, LCSW  Date/time:  06/26/2012 03:40 PM  Clinical Social Work is seeking post-discharge placement for this patient at the following level of care:   SKILLED NURSING   (*CSW will update this form in Epic as items are completed)     Patient/family provided with [facility] Department of Clinical Social Work's list of facilities offering this level of care within the geographic area requested by the patient (or if unable, by the patient's family).    Patient/family informed of their freedom to choose among providers that offer the needed level of care, that participate in Medicare, Medicaid or managed care program needed by the patient, have an available bed and are willing to accept the patient.    Patient/family informed of [facility]' ownership interest in [facility], as well as of the fact that they are under no obligation to receive care at this facility.  PASARR submitted to EDS on  PASARR number received from EDS on 09/22/2011  FL2 transmitted to all facilities in geographic area requested by pt/family on  06/26/2012 FL2 transmitted to all facilities within larger geographic area on 06/26/2012  Patient informed that his/her managed care company has contracts with or will negotiate with  certain facilities, including the following:     Patient/family informed of bed offers received:  06/26/2012 Patient chooses bed at [facility] Physician recommends and patient chooses bed at    Patient to be transferred to [facility] on   Patient to be transferred to facility by   The following physician request were entered in Epic:   Additional Comments:  Cori Razor LCSW [phone]

## 2012-06-26 NOTE — Progress Notes (Signed)
Clinical Social Work Department BRIEF PSYCHOSOCIAL ASSESSMENT 06/26/2012  Patient:  Madeline [name], Madeline [name]     Account Number:  [id number]     Admit date:  06/25/2012  Clinical Social Worker:  Candie Chroman  Date/Time:  06/26/2012 03:22 PM  Referred by:  Physician  Date Referred:  06/26/2012 Referred for  SNF Placement   Other Referral:   Interview type:  Patient Other interview type:    PSYCHOSOCIAL DATA Living Status:  HUSBAND Admitted from facility:   Level of care:   Primary support name:  Clint Schranz Primary support relationship to patient:  SPOUSE Degree of support available:   unclear    CURRENT CONCERNS Current Concerns  Post-Acute Placement   Other Concerns:    SOCIAL WORK ASSESSMENT / PLAN Pt is a 66 yr old female living at home prior to hospitalization. CSW met with pt to assist with d/c planning. Pt had made prior arrangements to have ST SNF placement at [facility] at d/c. CSW has contacted SNF and d/c plan has been confirmed. Pt has Blue Medicare and prior authorization has been requested. CSW will continue to follow to assist with d/c planning.   Assessment/plan status:  Psychosocial Support/Ongoing Assessment of Needs Other assessment/ plan:   Information/referral to community resources:   None needed at this time    PATIENT'S/FAMILY'S RESPONSE TO PLAN OF CARE: Pt is looking forward to feeling better and having rehab at [facility].    Cori Razor LCSW [phone]

## 2012-06-26 NOTE — Evaluation (Signed)
Occupational Therapy Evaluation Patient Details Name: Madeline [name] MRN: [id number] DOB: 01-Jan-1946 Today's Date: 06/26/2012 Time: 4098-1191 OT Time Calculation (min): 32 min  OT Assessment / Plan / Recommendation Clinical Impression  This 66 year old female was admitted for L TKA.  She needs min A for transfers and LB ADLs.  OT will follow her in acute with supervision level goals.    OT Assessment  Patient needs continued OT Services    Follow Up Recommendations  Skilled nursing facility    Barriers to Discharge      Equipment Recommendations  None recommended by OT;None recommended by PT    Recommendations for Other Services    Frequency  Min 2X/week    Precautions / Restrictions Precautions Precautions: Knee Knee Immobilizer - Right: Discontinue once straight leg raise with < 10 degree lag Restrictions Other Position/Activity Restrictions: WBAT   Pertinent Vitals/Pain Slight headache and LLE sore at upper thigh and shin:  Applied ice:  RN notified    ADL  Grooming: Performed;Wash/dry hands;Set up Where Assessed - Grooming: Unsupported sitting Upper Body Bathing: Simulated;Set up Where Assessed - Upper Body Bathing: Unsupported sitting Lower Body Bathing: Simulated;Minimal assistance Where Assessed - Lower Body Bathing: Supported sit to stand Upper Body Dressing: Simulated;Set up Where Assessed - Upper Body Dressing: Unsupported sitting Lower Body Dressing: Performed;Minimal assistance Where Assessed - Lower Body Dressing: Supported sit to stand Toilet Transfer: Performed;Minimal assistance Toilet Transfer Method: Surveyor, minerals: Materials engineer and Hygiene: Performed;Min guard (has ONQ pump) Where Assessed - Toileting Clothing Manipulation and Hygiene: Sit on 3-in-1 or toilet Transfers/Ambulation Related to ADLs: Min A, min cues for safety with transfer to 3:1    OT Diagnosis: Generalized weakness  OT  Problem List: Decreased strength;Decreased activity tolerance OT Treatment Interventions: Self-care/ADL training;DME and/or AE instruction;Patient/family education   OT Goals Acute Rehab OT Goals OT Goal Formulation: With patient Time For Goal Achievement: 07/03/12 Potential to Achieve Goals: Good ADL Goals Pt Will Perform Lower Body Bathing: with supervision;Sit to stand from chair ADL Goal: Lower Body Bathing - Progress: Goal set today Pt Will Perform Lower Body Dressing: with supervision;Sit to stand from chair ADL Goal: Lower Body Dressing - Progress: Goal set today Pt Will Transfer to Toilet: with supervision;Ambulation;3-in-1 (and all aspects of toileting) ADL Goal: Toilet Transfer - Progress: Goal set today  Visit Information  Last OT Received On: 06/26/12 Assistance Needed: +1    Subjective Data  Subjective: I feel like I have to do more (urinating) but I can't right now Patient Stated Goal: hopes to go to [facility]   Prior Functioning     Home Living Lives With: Spouse Communication Communication: No difficulties         Vision/Perception     Cognition  Overall Cognitive Status: Appears within functional limits for tasks assessed/performed Arousal/Alertness: Awake/alert Orientation Level: Appears intact for tasks assessed Behavior During Session: [facility] for tasks performed Cognition - Other Comments: min cues for LE position    Extremity/Trunk Assessment Right Upper Extremity Assessment RUE ROM/Strength/Tone: Within functional levels Left Upper Extremity Assessment LUE ROM/Strength/Tone: Within functional levels     Mobility Transfers Sit to Stand: 4: Min assist     Shoulder Instructions     Exercise     Balance     End of Session OT - End of Session Activity Tolerance: Patient tolerated treatment well Patient left: in chair;with call bell/phone within reach  GO     [facility]  06/26/2012, 12:16 PM Marica Otter,  OTR/L [phone] 06/26/2012

## 2012-06-26 NOTE — Progress Notes (Signed)
Physical Therapy Treatment Patient Details Name: Madeline [name] MRN: [id number] DOB: September 22, 1945 Today's Date: 06/26/2012 Time: 3086-5784 PT Time Calculation (min): 26 min  PT Assessment / Plan / Recommendation Comments on Treatment Session       Follow Up Recommendations  Post acute inpatient     Does the patient have the potential to tolerate intense rehabilitation  No, Recommend SNF  Barriers to Discharge        Equipment Recommendations  None recommended by OT;None recommended by PT    Recommendations for Other Services OT consult  Frequency 7X/week   Plan Discharge plan remains appropriate    Precautions / Restrictions Precautions Precautions: Knee Required Braces or Orthoses: Knee Immobilizer - Right Knee Immobilizer - Right: Discontinue once straight leg raise with < 10 degree lag Restrictions Weight Bearing Restrictions: No Other Position/Activity Restrictions: WBAT   Pertinent Vitals/Pain 4/10; premedicated, ice packs provided    Mobility  Bed Mobility Bed Mobility: Sit to Supine Sit to Supine: 4: Min assist Details for Bed Mobility Assistance: cues for sequence and use of L LE to self assist Transfers Transfers: Sit to Stand;Stand to Sit Sit to Stand: 4: Min assist Stand to Sit: 4: Min assist Details for Transfer Assistance: cues for use of UEs and for LE management Ambulation/Gait Ambulation/Gait Assistance: 4: Min assist Ambulation Distance (Feet): 79 Feet Assistive device: Rolling walker Ambulation/Gait Assistance Details: cues for sequence, posture, and position from RW Gait Pattern: Step-to pattern    Exercises     PT Diagnosis:    PT Problem List:   PT Treatment Interventions:     PT Goals Acute Rehab PT Goals PT Goal Formulation: With patient Time For Goal Achievement: 07/02/12 Potential to Achieve Goals: Good  Visit Information  Last PT Received On: 06/26/12 Assistance Needed: +1    Subjective Data  Subjective: This is much better  than this am Patient Stated Goal: Resume previous lifestyle with decreased pain   Cognition  Overall Cognitive Status: Appears within functional limits for tasks assessed/performed Arousal/Alertness: Awake/alert Orientation Level: Appears intact for tasks assessed Behavior During Session: [facility] for tasks performed Cognition - Other Comments: min cues for LE position    Balance     End of Session PT - End of Session Equipment Utilized During Treatment: Gait belt Activity Tolerance: Patient tolerated treatment well Nurse Communication: Mobility status   GP     [name] 06/26/2012, 2:31 PM

## 2012-06-27 LAB — CBC
HCT: 24.2 % — ABNORMAL LOW (ref 36.0–46.0)
Hemoglobin: 8.1 g/dL — ABNORMAL LOW (ref 12.0–15.0)
MCH: 29.3 pg (ref 26.0–34.0)
MCHC: 33.5 g/dL (ref 30.0–36.0)
RBC: 2.76 MIL/uL — ABNORMAL LOW (ref 3.87–5.11)

## 2012-06-27 LAB — BASIC METABOLIC PANEL
BUN: 10 mg/dL (ref 6–23)
CO2: 28 mEq/L (ref 19–32)
Calcium: 8.6 mg/dL (ref 8.4–10.5)
GFR calc non Af Amer: 87 mL/min — ABNORMAL LOW (ref >90)
Glucose, Bld: 105 mg/dL — ABNORMAL HIGH (ref 70–99)
Sodium: 132 mEq/L — ABNORMAL LOW (ref 135–145)

## 2012-06-27 NOTE — Progress Notes (Signed)
Physical Therapy Treatment Patient Details Name: Madeline [name] MRN: [id number] DOB: 30-May-1946 Today's Date: 06/27/2012 Time: 4098-1191 PT Time Calculation (min): 31 min  PT Assessment / Plan / Recommendation Comments on Treatment Session       Follow Up Recommendations  Post acute inpatient     Does the patient have the potential to tolerate intense rehabilitation  No, Recommend SNF  Barriers to Discharge        Equipment Recommendations  None recommended by OT;None recommended by PT    Recommendations for Other Services OT consult  Frequency 7X/week   Plan Discharge plan remains appropriate    Precautions / Restrictions Precautions Precautions: Knee Required Braces or Orthoses: Knee Immobilizer - Right Knee Immobilizer - Right: Discontinue once straight leg raise with < 10 degree lag Restrictions Weight Bearing Restrictions: No Other Position/Activity Restrictions: WBAT   Pertinent Vitals/Pain 5/10 with activity; pt premedicated    Mobility  Bed Mobility Bed Mobility: Sit to Supine;Supine to Sit Supine to Sit: 4: Min guard Sit to Supine: 4: Min guard Details for Bed Mobility Assistance: pt self assisting R LE with LE and/or UEs Transfers Transfers: Sit to Stand;Stand to Sit Sit to Stand: 4: Min guard Stand to Sit: 4: Min guard Details for Transfer Assistance: cues for use of UEs and for LE management Ambulation/Gait Ambulation/Gait Assistance: 4: Min assist;4: Min guard Ambulation Distance (Feet): 200 Feet (and 20) Assistive device: Rolling walker Ambulation/Gait Assistance Details: cues for posture and position from RW Gait Pattern: Step-to pattern;Step-through pattern    Exercises     PT Diagnosis:    PT Problem List:   PT Treatment Interventions:     PT Goals Acute Rehab PT Goals PT Goal Formulation: With patient Time For Goal Achievement: 07/02/12 Potential to Achieve Goals: Good  Visit Information  Last PT Received On: 06/27/12 Assistance  Needed: +1    Subjective Data  Patient Stated Goal: Resume previous lifestyle with decreased pain   Cognition  Overall Cognitive Status: Appears within functional limits for tasks assessed/performed Arousal/Alertness: Awake/alert Orientation Level: Appears intact for tasks assessed Behavior During Session: [facility] for tasks performed Cognition - Other Comments: min cues for LE position    Balance     End of Session PT - End of Session Equipment Utilized During Treatment: Right knee immobilizer Activity Tolerance: Patient tolerated treatment well Patient left: in bed;with call bell/phone within reach Nurse Communication: Mobility status CPM Right Knee CPM Right Knee: Off   GP     Tila Millirons 06/27/2012, 4:49 PM

## 2012-06-27 NOTE — Progress Notes (Signed)
   Subjective: 2 Days Post-Op Procedure(s) (LRB): TOTAL KNEE ARTHROPLASTY (Right) Patient reports pain as mild and moderate pain last night.   Patient seen in rounds with Dr. Lequita Halt. Patient is well, and has had no acute complaints or problems Plan is to go Skilled nursing facility after hospital stay.  Objective: Vital signs in last 24 hours: Temp:  [98.7 F (37.1 C)-99.4 F (37.4 C)] 99.1 F (37.3 C) (10/09 0545) Pulse Rate:  [88-112] 101  (10/09 0545) Resp:  [16] 16  (10/09 0800) BP: (105-129)/(64-75) 129/75 mmHg (10/09 0545) SpO2:  [18 %-98 %] 18 % (10/09 0800)  Intake/Output from previous day:  Intake/Output Summary (Last 24 hours) at 06/27/12 0924 Last data filed at 06/27/12 0545  Gross per 24 hour  Intake   1080 ml  Output   2575 ml  Net  -1495 ml    Intake/Output this shift:    Labs:  Basename 06/27/12 0448 06/26/12 0501  HGB 8.1* 9.0*    Basename 06/27/12 0448 06/26/12 0501  WBC 12.3* 8.5  RBC 2.76* 3.05*  HCT 24.2* 27.0*  PLT 194 212    Basename 06/27/12 0448 06/26/12 0501  NA 132* 134*  K 3.8 4.0  CL 96 100  CO2 28 30  BUN 10 13  CREATININE 0.73 0.65  GLUCOSE 105* 108*  CALCIUM 8.6 8.3*   No results found for this basename: LABPT:2,INR:2 in the last 72 hours  EXAM General - Patient is Alert, Appropriate and Oriented Extremity - Neurovascular intact Sensation intact distally Dorsiflexion/Plantar flexion intact No cellulitis present Dressing/Incision - clean, dry, no drainage, healing Motor Function - intact, moving foot and toes well on exam.   Past Medical History  Diagnosis Date  . Hyperlipidemia   . Cystitis 2007    sepsis post bladder biopsy, Dr Annabell Howells  . Headache 09-13-11    less frequent to none at present  . TMJ click 09-13-11    right side > left  . Cataracts, both eyes 09-13-11    not surgical yet  . Hypertension 09-13-11    tx. meds  . 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  GERD (gastroesophageal reflux disease) 09-14-11    tx. Nexium  . Fibromyalgia 09-14-11    Dr Dian Queen, [facility]  . Arthritis 09-14-11    osteoarthritis, osteopenia  . Strep throat 09-14-11    none in a yr.  . Fractures 09-14-11    toes-left foot  . Edema 09-14-11    occ. ankles/ feet > left leg  . Heart murmur 09-13-11  . Anxiety     Assessment/Plan: 2 Days Post-Op Procedure(s) (LRB): TOTAL KNEE ARTHROPLASTY (Right) Principal Problem:  *OA (osteoarthritis) of knee Active Problems:  Mild Postop Hyponatremia  Postop Acute blood loss anemia  Estimated Body mass index is 28.28 kg/(m^2) as calculated from the following:   Height as of this encounter: 5\' 8" (1.727 m).   Weight as of this encounter: 186 lb(84.369 kg). Up with therapy Plan for discharge tomorrow Discharge to SNF  DVT Prophylaxis - Xarelto Weight-Bearing as tolerated to right leg  PERKINS, ALEXZANDREW 06/27/2012, 9:24 AM

## 2012-06-27 NOTE — Progress Notes (Signed)
Physical Therapy Treatment Patient Details Name: Madeline [name] MRN: [id number] DOB: Aug 14, 1946 Today's Date: 06/27/2012 Time: 5638-7564 PT Time Calculation (min): 42 min  PT Assessment / Plan / Recommendation Comments on Treatment Session       Follow Up Recommendations  Post acute inpatient     Does the patient have the potential to tolerate intense rehabilitation  No, Recommend SNF  Barriers to Discharge        Equipment Recommendations  None recommended by OT;None recommended by PT    Recommendations for Other Services OT consult  Frequency 7X/week   Plan Discharge plan remains appropriate    Precautions / Restrictions Precautions Precautions: Knee Required Braces or Orthoses: Knee Immobilizer - Right Knee Immobilizer - Right: Discontinue once straight leg raise with < 10 degree lag Restrictions Weight Bearing Restrictions: No Other Position/Activity Restrictions: WBAT   Pertinent Vitals/Pain     Mobility  Transfers Transfers: Sit to Stand;Stand to Sit Sit to Stand: 4: Min guard Stand to Sit: 4: Min guard Details for Transfer Assistance: cues for use of UEs and for LE management Ambulation/Gait Ambulation/Gait Assistance: 4: Min assist Ambulation Distance (Feet): 121 Feet (and 20) Assistive device: Rolling walker Ambulation/Gait Assistance Details: cues for posture, sequence and position from RW Gait Pattern: Step-to pattern    Exercises Total Joint Exercises Ankle Circles/Pumps: AROM;Supine;Both;20 reps Quad Sets: AROM;Both;Supine;20 reps Heel Slides: AAROM;Right;Supine;20 reps Straight Leg Raises: AAROM;Supine;Right;AROM;20 reps   PT Diagnosis:    PT Problem List:   PT Treatment Interventions:     PT Goals Acute Rehab PT Goals PT Goal Formulation: With patient Time For Goal Achievement: 07/02/12 Potential to Achieve Goals: Good  Visit Information  Last PT Received On: 06/27/12 Assistance Needed: +1    Subjective Data  Patient Stated Goal:  Resume previous lifestyle with decreased pain   Cognition  Overall Cognitive Status: Appears within functional limits for tasks assessed/performed Arousal/Alertness: Awake/alert Orientation Level: Appears intact for tasks assessed Behavior During Session: [facility] for tasks performed Cognition - Other Comments: min cues for LE position    Balance     End of Session PT - End of Session Equipment Utilized During Treatment: Gait belt Activity Tolerance: Patient tolerated treatment well Nurse Communication: Mobility status   GP     Madeline [name] 06/27/2012, 1:00 PM

## 2012-06-28 LAB — CBC
Hemoglobin: 7.5 g/dL — ABNORMAL LOW (ref 12.0–15.0)
MCH: 29.5 pg (ref 26.0–34.0)
MCHC: 33.9 g/dL (ref 30.0–36.0)
MCV: 87 fL (ref 78.0–100.0)
Platelets: 200 10*3/uL (ref 150–400)
RBC: 2.54 MIL/uL — ABNORMAL LOW (ref 3.87–5.11)

## 2012-06-28 LAB — BASIC METABOLIC PANEL
CO2: 29 mEq/L (ref 19–32)
Calcium: 8.6 mg/dL (ref 8.4–10.5)
Creatinine, Ser: 0.62 mg/dL (ref 0.50–1.10)
GFR calc non Af Amer: >90 mL/min (ref >90)
Glucose, Bld: 120 mg/dL — ABNORMAL HIGH (ref 70–99)
Sodium: 132 mEq/L — ABNORMAL LOW (ref 135–145)

## 2012-06-28 MED ORDER — ACETAMINOPHEN 325 MG PO TABS: 650.0000 mg | ORAL_TABLET | Freq: Four times a day (QID) | ORAL | Status: DC | PRN

## 2012-06-28 MED ORDER — POLYSACCHARIDE IRON COMPLEX 150 MG PO CAPS
150.0000 mg | ORAL_CAPSULE | Freq: Two times a day (BID) | ORAL | Status: DC
  Filled 2012-06-28: qty 1

## 2012-06-28 MED ORDER — BISACODYL 10 MG RE SUPP: 10.0000 mg | Freq: Every day | RECTAL | Status: DC | PRN

## 2012-06-28 MED ORDER — HYDROMORPHONE HCL 2 MG PO TABS: 2.0000 mg | ORAL_TABLET | ORAL | Status: DC | PRN

## 2012-06-28 MED ORDER — METHOCARBAMOL 500 MG PO TABS: 500.0000 mg | ORAL_TABLET | Freq: Four times a day (QID) | ORAL | Status: DC | PRN

## 2012-06-28 MED ORDER — ONDANSETRON HCL 4 MG PO TABS: 4.0000 mg | ORAL_TABLET | Freq: Four times a day (QID) | ORAL | Status: DC | PRN

## 2012-06-28 MED ORDER — RIVAROXABAN 10 MG PO TABS: 10.0000 mg | ORAL_TABLET | Freq: Every day | ORAL | Status: DC

## 2012-06-28 MED ORDER — POLYETHYLENE GLYCOL 3350 17 G PO PACK: 17.0000 g | PACK | Freq: Every day | ORAL | Status: DC | PRN

## 2012-06-28 MED ORDER — POLYSACCHARIDE IRON COMPLEX 150 MG PO CAPS: 150.0000 mg | ORAL_CAPSULE | Freq: Two times a day (BID) | ORAL | Status: DC

## 2012-06-28 MED ORDER — DSS 100 MG PO CAPS: 100.0000 mg | ORAL_CAPSULE | Freq: Two times a day (BID) | ORAL | Status: DC

## 2012-06-28 NOTE — Progress Notes (Signed)
Pt for d/c to SNF today-[facility]. No IV noted. Dressing CDI to R knee. Hgb=7.5 from 8.0 yesterday but pt denies dizziness or light headedness. Remains pale looking. Up to BR with walker & 1 assist w/o problems. Husband will transport her to SNF per SW. Discharge instructions discussed with pt.

## 2012-06-28 NOTE — Progress Notes (Signed)
Physical Therapy Treatment Patient Details Name: OPRAH HANDKE MRN: [id number] DOB: 03-30-1946 Today's Date: 06/28/2012 Time: 4098-1191 PT Time Calculation (min): 25 min  PT Assessment / Plan / Recommendation Comments on Treatment Session       Follow Up Recommendations  Post acute inpatient     Does the patient have the potential to tolerate intense rehabilitation  No, Recommend SNF  Barriers to Discharge        Equipment Recommendations  None recommended by OT;None recommended by PT    Recommendations for Other Services OT consult  Frequency 7X/week   Plan Discharge plan remains appropriate    Precautions / Restrictions Precautions Precautions: Knee Required Braces or Orthoses: Knee Immobilizer - Right Knee Immobilizer - Right: Discontinue once straight leg raise with < 10 degree lag Restrictions Weight Bearing Restrictions: No Other Position/Activity Restrictions: WBAT   Pertinent Vitals/Pain 6/10; premedicated, cold packs provided    Mobility       Exercises Total Joint Exercises Ankle Circles/Pumps: AROM;Supine;Both;20 reps Quad Sets: AROM;Both;Supine;20 reps Heel Slides: AAROM;Right;Supine;20 reps Straight Leg Raises: AAROM;Supine;Right;AROM;20 reps   PT Diagnosis:    PT Problem List:   PT Treatment Interventions:     PT Goals Acute Rehab PT Goals PT Goal Formulation: With patient Time For Goal Achievement: 07/02/12 Potential to Achieve Goals: Good  Visit Information  Last PT Received On: 06/28/12 Assistance Needed: +1    Subjective Data  Subjective: I'm going to [place] today Patient Stated Goal: Resume previous lifestyle with decreased pain   Cognition  Overall Cognitive Status: Appears within functional limits for tasks assessed/performed Arousal/Alertness: Awake/alert Orientation Level: Appears intact for tasks assessed Behavior During Session: [facility] for tasks performed    Balance     End of Session PT - End of Session Equipment  Utilized During Treatment: Right knee immobilizer Activity Tolerance: Patient tolerated treatment well Patient left: in bed;with call bell/phone within reach   GP     Helayne Metsker 06/28/2012, 12:59 PM

## 2012-06-28 NOTE — Progress Notes (Signed)
Patient evaluated for long-term disease management services with [facility] as a benefit of her [employer]. Spoke with patient at bedside and she reports she is to discharge to [facility] today. Patient appreciative of visit.      Raiford Noble, MSN-Ed, RN,BSN [facility] Liaison [phone]

## 2012-06-28 NOTE — Discharge Summary (Signed)
Physician Discharge Summary   Patient ID: Madeline [name] MRN: [id number] DOB/AGE: July 21, 1946 66 y.o.  Admit date: 06/25/2012 Discharge date: 06/28/2012  Primary Diagnosis: Osteoarthritis Right knee   Admission Diagnoses:  Past Medical History  Diagnosis Date  . Hyperlipidemia   . Cystitis 2007    sepsis post bladder biopsy, Dr Annabell Howells  . Headache 09-13-11    less frequent to none at present  . TMJ click 09-13-11    right side > left  . Cataracts, both eyes 09-13-11    not surgical yet  . Hypertension 09-13-11    tx. meds  . 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 GERD (gastroesophageal reflux disease) 09-14-11    tx. Nexium  . Fibromyalgia 09-14-11    Dr Dian Queen, [facility]  . Arthritis 09-14-11    osteoarthritis, osteopenia  . Strep throat 09-14-11    none in a yr.  . Fractures 09-14-11    toes-left foot  . Edema 09-14-11    occ. ankles/ feet > left leg  . Heart murmur 09-13-11  . Anxiety    Discharge Diagnoses:   Principal Problem:  *OA (osteoarthritis) of knee Active Problems:  Mild Postop Hyponatremia  Postop Acute blood loss anemia  Estimated Body mass index is 28.28 kg/(m^2) as calculated from the following:   Height as of this encounter: 5\' 8" (1.727 m).   Weight as of this encounter: 186 lb(84.369 kg).  Classification of overweight in adults according to BMI (WHO, 1998)   Procedure:  Procedure(s) (LRB): TOTAL KNEE ARTHROPLASTY (Right)   Consults: None  HPI: Madeline [name] is a 66 y.o. year old female with end stage OA of her right knee with progressively worsening pain and dysfunction. She has constant pain, with activity and at rest and significant functional deficits with difficulties even with ADLs. She has had extensive non-op management including analgesics, injections of cortisone and viscosupplements, and home exercise program, but remains in significant pain with significant  dysfunction.Radiographs show bone on bone arthritis medial and patellofemoral. She presents now for right Total Knee Arthroplasty.   Laboratory Data: Hospital Outpatient Visit on 06/15/2012  Component Date Value Range Status  . MRSA, PCR 06/15/2012 NEGATIVE  NEGATIVE Final  . Staphylococcus aureus 06/15/2012 POSITIVE* NEGATIVE Final   Comment:                                 The Xpert SA Assay (FDA                          approved for NASAL specimens                          in patients over 59 years of age),                          is one component of                          a comprehensive surveillance                          program.  Test performance has  been validated by [facility] for patients greater                          than or equal to 23 year old.                          It is not intended                          to diagnose infection nor to                          guide or monitor treatment.  . WBC 06/15/2012 7.4  4.0 - 10.5 K/uL Final  . RBC 06/15/2012 4.37  3.87 - 5.11 MIL/uL Final  . Hemoglobin 06/15/2012 12.6  12.0 - 15.0 g/dL Final  . HCT 56/21/3086 38.2  36.0 - 46.0 % Final  . MCV 06/15/2012 87.4  78.0 - 100.0 fL Final  . MCH 06/15/2012 28.8  26.0 - 34.0 pg Final  . MCHC 06/15/2012 33.0  30.0 - 36.0 g/dL Final  . RDW 57/84/6962 14.1  11.5 - 15.5 % Final  . Platelets 06/15/2012 286  150 - 400 K/uL Final  . Sodium 06/15/2012 138  135 - 145 mEq/L Final  . Potassium 06/15/2012 3.8  3.5 - 5.1 mEq/L Final  . Chloride 06/15/2012 101  96 - 112 mEq/L Final  . CO2 06/15/2012 30  19 - 32 mEq/L Final  . Glucose, Bld 06/15/2012 97  70 - 99 mg/dL Final  . BUN 95/28/4132 19  6 - 23 mg/dL Final  . Creatinine, Ser 06/15/2012 0.64  0.50 - 1.10 mg/dL Final  . Calcium 44/09/270 9.5  8.4 - 10.5 mg/dL Final  . Total Protein 06/15/2012 7.2  6.0 - 8.3 g/dL Final  . Albumin 53/66/4403 4.0  3.5 - 5.2 g/dL Final  . AST  47/42/5956 20  0 - 37 U/L Final  . ALT 06/15/2012 12  0 - 35 U/L Final  . Alkaline Phosphatase 06/15/2012 100  39 - 117 U/L Final  . Total Bilirubin 06/15/2012 0.2* 0.3 - 1.2 mg/dL Final  . GFR calc non Af Amer 06/15/2012 >90  >90 mL/min Final  . GFR calc Af Amer 06/15/2012 >90  >90 mL/min Final   Comment:                                 The eGFR has been calculated                          using the CKD EPI equation.                          This calculation has not been                          validated in all clinical                          situations.  eGFR's persistently                          <90 mL/min signify                          possible Chronic Kidney Disease.  Marland Kitchen Prothrombin Time 06/15/2012 13.3  11.6 - 15.2 seconds Final  . INR 06/15/2012 1.02  0.00 - 1.49 Final  . aPTT 06/15/2012 31  24 - 37 seconds Final  . Color, Urine 06/15/2012 YELLOW  YELLOW Final  . APPearance 06/15/2012 CLOUDY* CLEAR Final  . Specific Gravity, Urine 06/15/2012 1.030  1.005 - 1.030 Final  . pH 06/15/2012 6.0  5.0 - 8.0 Final  . Glucose, UA 06/15/2012 NEGATIVE  NEGATIVE mg/dL Final  . Hgb urine dipstick 06/15/2012 NEGATIVE  NEGATIVE Final  . Bilirubin Urine 06/15/2012 NEGATIVE  NEGATIVE Final  . Ketones, ur 06/15/2012 NEGATIVE  NEGATIVE mg/dL Final  . Protein, ur 16/06/9603 NEGATIVE  NEGATIVE mg/dL Final  . Urobilinogen, UA 06/15/2012 0.2  0.0 - 1.0 mg/dL Final  . Nitrite 54/05/8118 NEGATIVE  NEGATIVE Final  . Leukocytes, UA 06/15/2012 NEGATIVE  NEGATIVE Final   MICROSCOPIC NOT DONE ON URINES WITH NEGATIVE PROTEIN, BLOOD, LEUKOCYTES, NITRITE, OR GLUCOSE <1000 mg/dL.    Basename 06/28/12 0505 06/27/12 0448 06/26/12 0501  HGB 7.5* 8.1* 9.0*    Basename 06/28/12 0505 06/27/12 0448  WBC 10.0 12.3*  RBC 2.54* 2.76*  HCT 22.1* 24.2*  PLT 200 194    Basename 06/28/12 0505 06/27/12 0448  NA 132* 132*  K 4.0 3.8  CL 97 96  CO2 29 28  BUN 9 10  CREATININE 0.62  0.73  GLUCOSE 120* 105*  CALCIUM 8.6 8.6   No results found for this basename: LABPT:2,INR:2 in the last 72 hours  X-Rays:No results found.  EKG: Orders placed in visit on 06/30/11  . EKG 12-LEAD     Hospital Course:  Madeline [name] is a 66 y.o. who was admitted to [facility]. They were brought to the operating room on 06/25/2012 and underwent Procedure(s): TOTAL KNEE ARTHROPLASTY.  Patient tolerated the procedure well and was later transferred to the recovery room and then to the orthopaedic floor for postoperative care.  They were given PO and IV analgesics for pain control following their surgery.  They were given 24 hours of postoperative antibiotics of  Anti-infectives     Start     Dose/Rate Route Frequency Ordered Stop   06/25/12 1800   ceFAZolin (ANCEF) IVPB 1 g/50 mL premix        1 g 100 mL/hr over 30 Minutes Intravenous Every 6 hours 06/25/12 1430 06/26/12 0037   06/25/12 1039   ceFAZolin (ANCEF) IVPB 2 g/50 mL premix        2 g 100 mL/hr over 30 Minutes Intravenous 60 min pre-op 06/25/12 1039 06/25/12 1155         and started on DVT prophylaxis in the form of Xarelto.   PT and OT were ordered for total joint protocol.  Discharge planning consulted to help with postop disposition and equipment needs. Social worker consulted for placement.  Patient had a rough night on the evening of surgery and started to get up OOB with therapy on day one.  Hemovac drain was pulled without difficulty.  Continued to work with therapy into day two.  Dressing was changed on day two and the incision was healing  well.  By day three, the patient had progressed with therapy and meeting their goals.  Incision was healing well.  Patient was seen in rounds and was ready to go to the SNF - [facility].  Discharge Medications: Prior to Admission medications   Medication Sig Start Date End Date Taking? Authorizing Provider  diltiazem (DILACOR XR) 180 MG 24 hr capsule Take 180 mg by mouth  at bedtime.    Yes Historical Provider, MD  DULoxetine (CYMBALTA) 20 MG capsule Take 20 mg by mouth 2 (two) times daily. Pt wants no generic   Yes Historical Provider, MD  esomeprazole (NEXIUM) 40 MG packet Take 40 mg by mouth daily before breakfast. Pt wants no generic   Yes Historical Provider, MD  fosinopril (MONOPRIL) 10 MG tablet Take 10 mg by mouth at bedtime.    Yes Historical Provider, MD  loratadine (CLARITIN) 10 MG tablet Take 10 mg by mouth as needed.    Yes Historical Provider, MD  pravastatin (PRAVACHOL) 40 MG tablet Take 40 mg by mouth at bedtime.    Yes Historical Provider, MD  pregabalin (LYRICA) 75 MG capsule Take 75 mg by mouth at bedtime. Pt wants no generic   Yes Historical Provider, MD  traMADol (ULTRAM) 50 MG tablet Take 100 mg by mouth every 6 (six) hours as needed. Pain   Yes Historical Provider, MD  acetaminophen (TYLENOL) 325 MG tablet Take 2 tablets (650 mg total) by mouth every 6 (six) hours as needed (or Fever >/= 101). 06/28/12   Gatha Mcnulty Julien Girt, PA  bisacodyl (DULCOLAX) 10 MG suppository Place 1 suppository (10 mg total) rectally daily as needed. 06/28/12   Maysin Carstens, PA  docusate sodium 100 MG CAPS Take 100 mg by mouth 2 (two) times daily. 06/28/12   Imraan Wendell, PA  HYDROmorphone (DILAUDID) 2 MG tablet Take 1-2 tablets (2-4 mg total) by mouth every 4 (four) hours as needed. 06/28/12   Eran Mistry, PA  iron polysaccharides (NIFEREX) 150 MG capsule Take 1 capsule (150 mg total) by mouth 2 (two) times daily. 06/28/12   Shellee Streng Julien Girt, PA  methocarbamol (ROBAXIN) 500 MG tablet Take 1 tablet (500 mg total) by mouth every 6 (six) hours as needed. 06/28/12   Kvon Mcilhenny, PA  ondansetron (ZOFRAN) 4 MG tablet Take 1 tablet (4 mg total) by mouth every 6 (six) hours as needed for nausea. 06/28/12   Walden Statz, PA  polyethylene glycol (MIRALAX / GLYCOLAX) packet Take 17 g by mouth daily as needed. 06/28/12   Charnee Turnipseed Julien Girt,  PA  rivaroxaban (XARELTO) 10 MG TABS tablet Take 1 tablet (10 mg total) by mouth daily with breakfast. Take Xarelto for two and a half more weeks, then discontinue Xarelto. Once the patient has completed the Xarelto, they may resume the 81 mg Aspirin. 06/28/12   Coti Burd Julien Girt, PA    Diet: Cardiac diet Activity:WBAT Follow-up:in 2 weeks Disposition - Skilled nursing facility - [facility] Discharged Condition: good   Discharge Orders    Future Orders Please Complete By Expires   Diet - low sodium heart healthy      Diet Carb Modified      Call MD / Call 911      Comments:   If you experience chest pain or shortness of breath, CALL 911 and be transported to the hospital emergency room.  If you develope a fever above 101 F, pus (white drainage) or increased drainage or redness at the wound, or calf pain, call your surgeon's office.  Discharge instructions      Comments:   Pick up stool softner and laxative for home. Do not submerge incision under water. May shower. Continue to use ice for pain and swelling from surgery.  Take Xarelto for two and a half more weeks, then discontinue Xarelto. Once the patient has completed the Xarelto, they may resume the 81 mg Aspirin.   Constipation Prevention      Comments:   Drink plenty of fluids.  Prune juice may be helpful.  You may use a stool softener, such as Colace (over the counter) 100 mg twice a day.  Use MiraLax (over the counter) for constipation as needed.   Increase activity slowly as tolerated      Patient may shower      Comments:   You may shower without a dressing once there is no drainage.  Do not wash over the wound.  If drainage remains, do not shower until drainage stops.   Driving restrictions      Comments:   No driving until released by the physician.   Lifting restrictions      Comments:   No lifting until released by the physician.   TED hose      Comments:   Use stockings (TED hose) for 3 weeks on both  leg(s).  You may remove them at night for sleeping.   Change dressing      Comments:   Change dressing daily with sterile 4 x 4 inch gauze dressing and apply TED hose. Do not submerge the incision under water.   Do not put a pillow under the knee. Place it under the heel.      Do not sit on low chairs, stoools or toilet seats, as it may be difficult to get up from low surfaces          Medication List     As of 06/28/2012  9:03 AM    STOP taking these medications         aspirin EC 81 MG tablet      CALTRATE 600+D PO      CENTRUM PO      cyclobenzaprine 10 MG tablet   Commonly known as: FLEXERIL      diphenhydrAMINE 25 MG tablet   Commonly known as: BENADRYL      HYDROcodone-acetaminophen 5-500 MG per tablet   Commonly known as: VICODIN      meloxicam 7.5 MG tablet   Commonly known as: MOBIC      Omega-3 Fatty Acids 300 MG Caps      tiZANidine 2 MG tablet   Commonly known as: ZANAFLEX      TAKE these medications         acetaminophen 325 MG tablet   Commonly known as: TYLENOL   Take 2 tablets (650 mg total) by mouth every 6 (six) hours as needed (or Fever >/= 101).      bisacodyl 10 MG suppository   Commonly known as: DULCOLAX   Place 1 suppository (10 mg total) rectally daily as needed.      diltiazem 180 MG 24 hr capsule   Commonly known as: DILACOR XR   Take 180 mg by mouth at bedtime.      DSS 100 MG Caps   Take 100 mg by mouth 2 (two) times daily.      DULoxetine 20 MG capsule   Commonly known as: CYMBALTA   Take 20 mg by mouth 2 (two) times daily. DO NOT  SUBSTITUTE - NO GENERICS      esomeprazole 40 MG packet   Commonly known as: NEXIUM   Take 40 mg by mouth daily before breakfast. Pt wants no generic      fosinopril 10 MG tablet   Commonly known as: MONOPRIL   Take 10 mg by mouth at bedtime.      HYDROmorphone 2 MG tablet   Commonly known as: DILAUDID   Take 1-2 tablets (2-4 mg total) by mouth every 4 (four) hours as needed.      iron  polysaccharides 150 MG capsule   Commonly known as: NIFEREX   Take 1 capsule (150 mg total) by mouth 2 (two) times daily.      loratadine 10 MG tablet   Commonly known as: CLARITIN   Take 10 mg by mouth as needed.      methocarbamol 500 MG tablet   Commonly known as: ROBAXIN   Take 1 tablet (500 mg total) by mouth every 6 (six) hours as needed.      ondansetron 4 MG tablet   Commonly known as: ZOFRAN   Take 1 tablet (4 mg total) by mouth every 6 (six) hours as needed for nausea.      polyethylene glycol packet   Commonly known as: MIRALAX / GLYCOLAX   Take 17 g by mouth daily as needed.      pravastatin 40 MG tablet   Commonly known as: PRAVACHOL   Take 40 mg by mouth at bedtime.      pregabalin 75 MG capsule   Commonly known as: LYRICA   Take 75 mg by mouth at bedtime. Pt wants no generic      rivaroxaban 10 MG Tabs tablet   Commonly known as: XARELTO   Take 1 tablet (10 mg total) by mouth daily with breakfast. Take Xarelto for two and a half more weeks, then discontinue Xarelto.  Once the patient has completed the Xarelto, they may resume the 81 mg Aspirin.      traMADol 50 MG tablet   Commonly known as: ULTRAM   Take 100 mg by mouth every 6 (six) hours as needed. Pain           Follow-up Information    Follow up with Loanne Drilling, MD. Schedule an appointment as soon as possible for a visit in 2 weeks.   Contact information:   [address], [address] [place] [address] [phone]          Signed: Patrica Duel 06/28/2012, 9:03 AM

## 2012-06-28 NOTE — Progress Notes (Signed)
   Subjective: 3 Days Post-Op Procedure(s) (LRB): TOTAL KNEE ARTHROPLASTY (Right) Patient reports pain as mild.   Patient seen in rounds with Dr. Lequita Halt. Patient is well, and has had no acute complaints or problems Patient is ready to go the SNF today - [facility].  Objective: Vital signs in last 24 hours: Temp:  [98 F (36.7 C)-98.2 F (36.8 C)] 98 F (36.7 C) (10/10 0555) Pulse Rate:  [81-102] 88  (10/10 0555) Resp:  [16] 16  (10/10 0800) BP: (103-115)/(67-72) 104/67 mmHg (10/10 0555) SpO2:  [90 %-100 %] 91 % (10/10 0555)  Intake/Output from previous day:  Intake/Output Summary (Last 24 hours) at 06/28/12 0849 Last data filed at 06/28/12 0555  Gross per 24 hour  Intake    360 ml  Output   1800 ml  Net  -1440 ml    Intake/Output this shift:    Labs:  Basename 06/28/12 0505 06/27/12 0448 06/26/12 0501  HGB 7.5* 8.1* 9.0*    Basename 06/28/12 0505 06/27/12 0448  WBC 10.0 12.3*  RBC 2.54* 2.76*  HCT 22.1* 24.2*  PLT 200 194    Basename 06/28/12 0505 06/27/12 0448  NA 132* 132*  K 4.0 3.8  CL 97 96  CO2 29 28  BUN 9 10  CREATININE 0.62 0.73  GLUCOSE 120* 105*  CALCIUM 8.6 8.6   No results found for this basename: LABPT:2,INR:2 in the last 72 hours  EXAM: General - Patient is Alert, Appropriate and Oriented Extremity - Neurovascular intact Sensation intact distally Dorsiflexion/Plantar flexion intact No cellulitis present Incision - clean, dry, no drainage, healing Motor Function - intact, moving foot and toes well on exam.   Assessment/Plan: 3 Days Post-Op Procedure(s) (LRB): TOTAL KNEE ARTHROPLASTY (Right) Procedure(s) (LRB): TOTAL KNEE ARTHROPLASTY (Right) Past Medical History  Diagnosis Date  . Hyperlipidemia   . Cystitis 2007    sepsis post bladder biopsy, Dr Annabell Howells  . Headache 09-13-11    less frequent to none at present  . TMJ click 09-13-11    right side > left  . Cataracts, both eyes 09-13-11    not surgical yet  . Hypertension  09-13-11    tx. meds  . 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 GERD (gastroesophageal reflux disease) 09-14-11    tx. Nexium  . Fibromyalgia 09-14-11    Dr Dian Queen, [facility]  . Arthritis 09-14-11    osteoarthritis, osteopenia  . Strep throat 09-14-11    none in a yr.  . Fractures 09-14-11    toes-left foot  . Edema 09-14-11    occ. ankles/ feet > left leg  . Heart murmur 09-13-11  . Anxiety    Principal Problem:  *OA (osteoarthritis) of knee Active Problems:  Mild Postop Hyponatremia  Postop Acute blood loss anemia  Estimated Body mass index is 28.28 kg/(m^2) as calculated from the following:   Height as of this encounter: 5\' 8" (1.727 m).   Weight as of this encounter: 186 lb(84.369 kg). Discharge to SNF Diet - Cardiac diet Follow up - in 2 weeks Activity - WBAT Disposition - Skilled nursing facility Condition Upon Discharge - Good D/C Meds - See DC Summary DVT Prophylaxis - Xarelto  Madeline [name] 06/28/2012, 8:49 AM

## 2012-06-28 NOTE — Progress Notes (Signed)
Clinical Social Work Department CLINICAL SOCIAL WORK PLACEMENT NOTE 06/28/2012  Patient:  Madeline [name], Madeline [name]  Account Number:  [id number] Admit date:  06/25/2012  Clinical Social Worker:  Cori Razor, LCSW  Date/time:  06/26/2012 03:40 PM  Clinical Social Work is seeking post-discharge placement for this patient at the following level of care:   SKILLED NURSING   (*CSW will update this form in Epic as items are completed)     Patient/family provided with [facility] Department of Clinical Social Work's list of facilities offering this level of care within the geographic area requested by the patient (or if unable, by the patient's family).    Patient/family informed of their freedom to choose among providers that offer the needed level of care, that participate in Medicare, Medicaid or managed care program needed by the patient, have an available bed and are willing to accept the patient.    Patient/family informed of [facility]' ownership interest in [facility], as well as of the fact that they are under no obligation to receive care at this facility.  PASARR submitted to EDS on  PASARR number received from [employer] on 09/22/2011  FL2 transmitted to all facilities in geographic area requested by pt/family on  06/26/2012 FL2 transmitted to all facilities within larger geographic area on 06/26/2012  Patient informed that his/her managed care company has contracts with or will negotiate with  certain facilities, including the following:     Patient/family informed of bed offers received:  06/26/2012 Patient chooses bed at [facility] Physician recommends and patient chooses bed at    Patient to be transferred to [facility] on  06/28/2012 Patient to be transferred to facility by FAMILY  The following physician request were entered in Epic:   Additional Comments: [employer] provided prior approval for SNF placement.  Cori Razor LCSW [phone]

## 2012-07-09 ENCOUNTER — Ambulatory Visit: Payer: Medicare Other | Attending: Orthopedic Surgery | Admitting: Physical Therapy

## 2012-07-09 DIAGNOSIS — R262 Difficulty in walking, not elsewhere classified: Secondary | ICD-10-CM | POA: Insufficient documentation

## 2012-07-09 DIAGNOSIS — M545 Low back pain, unspecified: Secondary | ICD-10-CM | POA: Insufficient documentation

## 2012-07-09 DIAGNOSIS — M25579 Pain in unspecified ankle and joints of unspecified foot: Secondary | ICD-10-CM | POA: Insufficient documentation

## 2012-07-09 DIAGNOSIS — IMO0001 Reserved for inherently not codable concepts without codable children: Secondary | ICD-10-CM | POA: Insufficient documentation

## 2012-07-11 ENCOUNTER — Ambulatory Visit: Payer: Medicare Other | Admitting: Physical Therapy

## 2012-07-12 ENCOUNTER — Ambulatory Visit: Payer: Medicare Other | Admitting: Physical Therapy

## 2012-07-16 ENCOUNTER — Ambulatory Visit: Payer: Medicare Other | Admitting: Physical Therapy

## 2012-07-16 ENCOUNTER — Encounter: Payer: Medicare Other | Admitting: Internal Medicine

## 2012-07-18 ENCOUNTER — Ambulatory Visit: Payer: Medicare Other | Admitting: Physical Therapy

## 2012-07-19 ENCOUNTER — Ambulatory Visit: Payer: Medicare Other | Admitting: Physical Therapy

## 2012-07-23 ENCOUNTER — Ambulatory Visit: Payer: Medicare Other | Attending: Orthopedic Surgery | Admitting: Physical Therapy

## 2012-07-23 DIAGNOSIS — IMO0001 Reserved for inherently not codable concepts without codable children: Secondary | ICD-10-CM | POA: Insufficient documentation

## 2012-07-23 DIAGNOSIS — M25579 Pain in unspecified ankle and joints of unspecified foot: Secondary | ICD-10-CM | POA: Insufficient documentation

## 2012-07-23 DIAGNOSIS — M545 Low back pain, unspecified: Secondary | ICD-10-CM | POA: Insufficient documentation

## 2012-07-23 DIAGNOSIS — R262 Difficulty in walking, not elsewhere classified: Secondary | ICD-10-CM | POA: Insufficient documentation

## 2012-07-25 ENCOUNTER — Ambulatory Visit: Payer: Medicare Other | Admitting: Physical Therapy

## 2012-07-26 ENCOUNTER — Ambulatory Visit: Payer: Medicare Other | Admitting: Physical Therapy

## 2012-07-30 ENCOUNTER — Ambulatory Visit: Payer: Medicare Other | Admitting: Physical Therapy

## 2012-08-01 ENCOUNTER — Ambulatory Visit: Payer: Medicare Other | Admitting: Physical Therapy

## 2012-08-02 ENCOUNTER — Ambulatory Visit: Payer: Medicare Other | Admitting: Physical Therapy

## 2012-08-07 ENCOUNTER — Ambulatory Visit: Payer: Medicare Other | Admitting: Physical Therapy

## 2012-08-10 ENCOUNTER — Ambulatory Visit: Payer: Medicare Other | Admitting: Physical Therapy

## 2012-08-13 ENCOUNTER — Ambulatory Visit: Payer: Medicare Other | Admitting: Physical Therapy

## 2012-08-15 ENCOUNTER — Ambulatory Visit: Payer: Medicare Other

## 2012-08-21 ENCOUNTER — Ambulatory Visit: Payer: Medicare Other | Attending: Orthopedic Surgery | Admitting: Physical Therapy

## 2012-08-21 DIAGNOSIS — M545 Low back pain, unspecified: Secondary | ICD-10-CM | POA: Insufficient documentation

## 2012-08-21 DIAGNOSIS — IMO0001 Reserved for inherently not codable concepts without codable children: Secondary | ICD-10-CM | POA: Insufficient documentation

## 2012-08-21 DIAGNOSIS — R262 Difficulty in walking, not elsewhere classified: Secondary | ICD-10-CM | POA: Insufficient documentation

## 2012-08-21 DIAGNOSIS — M25579 Pain in unspecified ankle and joints of unspecified foot: Secondary | ICD-10-CM | POA: Insufficient documentation

## 2012-08-24 ENCOUNTER — Ambulatory Visit: Payer: Medicare Other | Admitting: Physical Therapy

## 2012-09-05 ENCOUNTER — Ambulatory Visit: Payer: Medicare Other | Admitting: Physical Therapy

## 2012-09-10 ENCOUNTER — Ambulatory Visit: Payer: Medicare Other | Admitting: Physical Therapy

## 2012-09-14 ENCOUNTER — Ambulatory Visit: Payer: Medicare Other | Admitting: Physical Therapy

## 2012-09-18 ENCOUNTER — Ambulatory Visit: Payer: Medicare Other | Admitting: Physical Therapy

## 2012-09-21 ENCOUNTER — Ambulatory Visit: Payer: Medicare Other | Admitting: Physical Therapy

## 2012-09-24 ENCOUNTER — Ambulatory Visit: Payer: Medicare Other | Attending: Orthopedic Surgery | Admitting: Physical Therapy

## 2012-09-24 DIAGNOSIS — R262 Difficulty in walking, not elsewhere classified: Secondary | ICD-10-CM | POA: Insufficient documentation

## 2012-09-24 DIAGNOSIS — M545 Low back pain, unspecified: Secondary | ICD-10-CM | POA: Insufficient documentation

## 2012-09-24 DIAGNOSIS — M25579 Pain in unspecified ankle and joints of unspecified foot: Secondary | ICD-10-CM | POA: Insufficient documentation

## 2012-09-24 DIAGNOSIS — IMO0001 Reserved for inherently not codable concepts without codable children: Secondary | ICD-10-CM | POA: Insufficient documentation

## 2012-09-27 ENCOUNTER — Ambulatory Visit: Payer: Medicare Other | Admitting: Physical Therapy

## 2012-10-02 ENCOUNTER — Ambulatory Visit: Payer: Medicare Other | Admitting: Physical Therapy

## 2012-10-05 ENCOUNTER — Ambulatory Visit: Payer: Medicare Other | Admitting: Physical Therapy

## 2013-01-14 ENCOUNTER — Encounter: Payer: Self-pay | Admitting: Family Medicine

## 2013-01-14 ENCOUNTER — Ambulatory Visit (INDEPENDENT_AMBULATORY_CARE_PROVIDER_SITE_OTHER): Payer: Medicare Other | Admitting: Family Medicine

## 2013-01-14 VITALS — BP 124/82 | HR 86 | Temp 99.8°F | Wt 189.6 lb

## 2013-01-14 DIAGNOSIS — J209 Acute bronchitis, unspecified: Secondary | ICD-10-CM

## 2013-01-14 MED ORDER — AZITHROMYCIN 250 MG PO TABS: ORAL_TABLET | ORAL | Status: DC

## 2013-01-14 NOTE — Progress Notes (Signed)
  Subjective:     Madeline [name] is a 67 y.o. female here for evaluation of a cough. Onset of symptoms was 5 days ago. Symptoms have been gradually worsening since that time. The cough is productive of green sputum and is aggravated by infection, pollens and reclining position. Associated symptoms include: postnasal drip, shortness of breath, sputum production and wheezing. Patient does not have a history of asthma. Patient does have a history of environmental allergens. Patient has not traveled recently. Patient does not have a history of smoking. Patient has not had a previous chest x-ray. Patient has not had a PPD done.  The following portions of the patient's history were reviewed and updated as appropriate: allergies, current medications, past family history, past medical history, past social history, past surgical history and problem list.  Review of Systems Pertinent items are noted in HPI.    Objective:    Oxygen saturation 96% on room air BP 124/82  Pulse 86  Temp(Src) 99.8 F (37.7 C) (Oral)  Wt 189 lb 9.6 oz (86.002 kg)  BMI 28.84 kg/m2  SpO2 96% General appearance: alert, cooperative, appears stated age and no distress Ears: normal TM's and external ear canals both ears Nose: Nares normal. Septum midline. Mucosa normal. No drainage or sinus tenderness. Throat: abnormal findings: mild oropharyngeal erythema and pnd Neck: mild anterior cervical adenopathy, supple, symmetrical, trachea midline and thyroid not enlarged, symmetric, no tenderness/mass/nodules Lungs: wheezes bilaterally Heart: S1, S2 normal    Assessment:    Acute Bronchitis    Plan:    Antibiotics per medication orders. Avoid exposure to tobacco smoke and fumes. Call if shortness of breath worsens, blood in sputum, change in character of cough, development of fever or chills, inability to maintain nutrition and hydration. Avoid exposure to tobacco smoke and fumes. Trial of antihistamines. otc cough  med---mucinex, delsym

## 2013-01-14 NOTE — Patient Instructions (Signed)

## 2013-01-18 ENCOUNTER — Encounter: Payer: Self-pay | Admitting: Family Medicine

## 2013-01-18 ENCOUNTER — Ambulatory Visit (HOSPITAL_BASED_OUTPATIENT_CLINIC_OR_DEPARTMENT_OTHER)
Admission: RE | Admit: 2013-01-18 | Discharge: 2013-01-18 | Disposition: A | Discharge status: Home / Self Care | Payer: Medicare Other | Source: Ambulatory Visit | Attending: Family Medicine | Admitting: Family Medicine

## 2013-01-18 ENCOUNTER — Ambulatory Visit (INDEPENDENT_AMBULATORY_CARE_PROVIDER_SITE_OTHER): Payer: Medicare Other | Admitting: Family Medicine

## 2013-01-18 ENCOUNTER — Telehealth: Payer: Self-pay | Admitting: Internal Medicine

## 2013-01-18 VITALS — BP 112/64 | HR 92 | Temp 98.6°F | Wt 189.0 lb

## 2013-01-18 DIAGNOSIS — R05 Cough: Secondary | ICD-10-CM | POA: Insufficient documentation

## 2013-01-18 DIAGNOSIS — R059 Cough, unspecified: Secondary | ICD-10-CM | POA: Insufficient documentation

## 2013-01-18 DIAGNOSIS — J209 Acute bronchitis, unspecified: Secondary | ICD-10-CM

## 2013-01-18 MED ORDER — METHYLPREDNISOLONE ACETATE 80 MG/ML IJ SUSP
80.0000 mg | Freq: Once | INTRAMUSCULAR | Status: AC
  Administered 2013-01-18: 80 mg via INTRAMUSCULAR

## 2013-01-18 MED ORDER — PREDNISONE 10 MG PO TABS: ORAL_TABLET | ORAL | Status: DC

## 2013-01-18 MED ORDER — MOXIFLOXACIN HCL 400 MG PO TABS: 400.0000 mg | ORAL_TABLET | Freq: Every day | ORAL | Status: DC

## 2013-01-18 NOTE — Patient Instructions (Addendum)

## 2013-01-18 NOTE — Telephone Encounter (Signed)
Spoke with patient and called in Rx, she is not feeling better and will start the Avelox tomorrow but she wanted to come in today for the wheezing, Npo apt's with Hop, patient will see Dr.Lowne again     KP

## 2013-01-18 NOTE — Telephone Encounter (Signed)
Patient states she was seen for bronchitis and she is only a little better. She has 1 more day left on her zpak. She would like to know if she needs another appt or if she needs another round of antibiotics. Pt uses [facility] [address]

## 2013-01-18 NOTE — Progress Notes (Signed)
  Subjective:     Madeline [name] is a 67 y.o. female here for evaluation of a cough. Onset of symptoms was several days ago. Symptoms have been unchanged since that time. The cough is productive and is aggravated by infection and reclining position. Associated symptoms include: shortness of breath, sputum production and wheezing. Patient does not have a history of asthma. Patient does not have a history of environmental allergens. Patient has not traveled recently. Patient does not have a history of smoking. Patient has not had a previous chest x-ray. Patient has not had a PPD done.  The following portions of the patient's history were reviewed and updated as appropriate: allergies, current medications, past family history, past medical history, past social history, past surgical history and problem list.  Review of Systems Pertinent items are noted in HPI.    Objective:    Oxygen saturation 97% on room air BP 112/64  Pulse 92  Temp(Src) 98.6 F (37 C) (Oral)  Wt 189 lb (85.73 kg)  BMI 28.74 kg/m2  SpO2 97% General appearance: alert, cooperative, appears stated age and no distress Neck: no adenopathy, supple, symmetrical, trachea midline and thyroid not enlarged, symmetric, no tenderness/mass/nodules Lungs: diminished breath sounds bilaterally and wheezes bilaterally Heart: S1, S2 normal    Assessment:    Acute Bronchitis    Plan:    Antibiotics per medication orders. Antitussives per medication orders. Avoid exposure to tobacco smoke and fumes. B-agonist inhaler. Call if shortness of breath worsens, blood in sputum, change in character of cough, development of fever or chills, inability to maintain nutrition and hydration. Avoid exposure to tobacco smoke and fumes.

## 2013-01-18 NOTE — Telephone Encounter (Signed)
Can try avelox 400 mg 1 po q x 5 but if she is wheezing she should see pcp for f/u

## 2013-01-18 NOTE — Telephone Encounter (Signed)
Pt still c/o a low grade fever, chest congestion, coughing up clear mucous, wheezing and some ear,and throat pain. Pt seen on 01-14-13, Dx with bronchitis. Please advise

## 2013-01-18 NOTE — Addendum Note (Signed)
Addended by: Jackson Latino on: 01/18/2013 05:23 PM   Modules accepted: Orders

## 2013-01-21 ENCOUNTER — Telehealth: Payer: Self-pay | Admitting: Internal Medicine

## 2013-01-21 MED ORDER — DOXYCYCLINE HYCLATE 100 MG PO TABS: 100.0000 mg | ORAL_TABLET | Freq: Two times a day (BID) | ORAL | Status: DC

## 2013-01-21 NOTE — Telephone Encounter (Signed)
Madeline [name] , she saw Myrene Buddy 4/28 & 5/2 . Please have her review & respond. I would have to call her in .Thanks, [employer]

## 2013-01-21 NOTE — Telephone Encounter (Signed)
Please advise      KP 

## 2013-01-21 NOTE — Telephone Encounter (Signed)
Doxycycline 100 mg bid for 7 days and f/u hopp if no better

## 2013-01-21 NOTE — Telephone Encounter (Signed)
Patient stated she is not taking any Abx (Avelox prescribed) at this time, she stopped due to the side effects listed on the label (Tendon rupture or Tendonitis) She has a hx of Tendon issues and is on Prednisone and the two medications together can cause issues with the Tendon. She wants to know what to do, her CXR was normal. Please advise     KP

## 2013-01-21 NOTE — Telephone Encounter (Signed)
Spoke with patient and made her aware that the Doxy is being called in to the pharmacy, she know tells me that the area looks like a bug bite. Discussed with dDLowne who advised if so the Doxy would help but the patient would need to come into the office to see Dr.Hopper to be sure. She had not had any ticks on her and can not remember being bit, she will call for an appointment if no improvement in the next few days.     KP

## 2013-01-21 NOTE — Telephone Encounter (Signed)
Patient Information:  Caller Name: Verbie  Phone: [phone]  Patient: Madeline [name], Madeline [name]  Gender: Female  DOB: 1946-03-06  Age: 67 Years  PCP: Marga Melnick  Office Follow Up:  Does the office need to follow up with this patient?: Yes  Instructions For The Office: Patient has several concers about recent medication and "target rash" that does appear to be a rash.  she would like Dr. Alwyn Ren and Dr. Laury Axon updated about the Avelox that she is concerned about taking.  Please contact patient.  RN Note:  Patient has several concers about recent medication and "target rash" that does appear to be a rash.  she would like Dr. Alwyn Ren and Dr. Laury Axon updated about the Avelox that she is concerned about taking.  Please contact patient.  Symptoms  Reason For Call & Symptoms: She states she thinks she may have a Target rash?  She noticed a bruise on inside of right arm on 01/19/13, smaller than pencil eraser "pinkish purple bruise.  Now bigger larger than pencil eraser smaller that dime.  There is no bumps. Color is now dark pink purple in middle . she decribes rings around it like a "target rash", but there is no bumps, no itching, no pain.  She has not had any tick bites or insects. No injury .    (2) She was seen in the office on  01/14/13 and again Friday 01/18/13 for Bronchitis. She was treated with a steroid injection, oral prednisone and completed Zpack and Avelox x1 pill . SHE IS NOT GOING TO TAKE THE AVELOX due to tendon issues. Tendon issues in her wrist.  She states she feels some better and wants to Let Dr. Alwyn Ren and Laury Axon know she is not taking the medicaiton  Reviewed Health History In EMR: Yes  Reviewed Medications In EMR: Yes  Reviewed Allergies In EMR: Yes  Reviewed Surgeries / Procedures: Yes  Date of Onset of Symptoms: 01/18/2013  Guideline(s) Used:  No Protocol Available - Sick Adult  Disposition Per Guideline:   Discuss with PCP and Callback by Nurse Today  Reason For Disposition  Reached:   Nursing judgment  Advice Given:  Call Back If:  New symptoms develop  You become worse.  Patient Will Follow Care Advice:  YES

## 2013-01-21 NOTE — Telephone Encounter (Signed)
Doxy as above--

## 2013-03-07 ENCOUNTER — Encounter: Payer: Self-pay | Admitting: Internal Medicine

## 2013-06-03 ENCOUNTER — Encounter: Payer: Medicare Other | Admitting: Internal Medicine

## 2013-08-01 ENCOUNTER — Encounter: Payer: Self-pay | Admitting: Nurse Practitioner

## 2013-08-01 ENCOUNTER — Ambulatory Visit (INDEPENDENT_AMBULATORY_CARE_PROVIDER_SITE_OTHER): Payer: Medicare Other | Admitting: Nurse Practitioner

## 2013-08-01 VITALS — BP 152/89 | HR 69 | Temp 97.8°F | Ht 67.75 in | Wt 201.6 lb

## 2013-08-01 DIAGNOSIS — N390 Urinary tract infection, site not specified: Secondary | ICD-10-CM

## 2013-08-01 MED ORDER — NITROFURANTOIN MACROCRYSTAL 100 MG PO CAPS: 100.0000 mg | ORAL_CAPSULE | Freq: Four times a day (QID) | ORAL | Status: DC

## 2013-08-01 NOTE — Progress Notes (Signed)
Pre-visit discussion using our clinic review tool. No additional management support is needed unless otherwise documented below in the visit note.  

## 2013-08-01 NOTE — Progress Notes (Signed)
  Subjective:    Patient ID: Madeline [name], female    DOB: 1945-12-25, 67 y.o.   MRN: [id number]  Urinary Tract Infection  This is a new problem. The current episode started in the past 7 days. The problem occurs every urination. The problem has been unchanged. The quality of the pain is described as burning. The pain is mild. There has been no fever. She is sexually active. There is no history of pyelonephritis. Associated symptoms include hesitancy and urgency. Pertinent negatives include no chills, discharge, flank pain, frequency, nausea or vomiting. She has tried nothing for the symptoms.      Review of Systems  Constitutional: Negative for fever, chills, activity change, appetite change and fatigue.  Gastrointestinal: Negative for nausea, vomiting and abdominal pain.  Genitourinary: Positive for dysuria, hesitancy and urgency. Negative for frequency, flank pain and vaginal discharge.  Musculoskeletal: Negative for back pain.       Objective:   Physical Exam  Vitals reviewed. Constitutional: She is oriented to person, place, and time. She appears well-developed and well-nourished. No distress.  HENT:  Head: Normocephalic and atraumatic.  Eyes: Conjunctivae are normal. Right eye exhibits no discharge. Left eye exhibits no discharge.  Cardiovascular: Normal rate.   Abdominal: Soft. She exhibits no distension and no mass. There is no tenderness. There is no rebound and no guarding.  Musculoskeletal: She exhibits no tenderness (no cva tenderness).  Neurological: She is alert and oriented to person, place, and time.  Skin: Skin is warm and dry.  Psychiatric: She has a normal mood and affect. Her behavior is normal. Thought content normal.          Assessment & Plan:  1. Infection of urinary tract Dysuria & urgency - nitrofurantoin (MACRODANTIN) 100 MG capsule; Take 1 capsule (100 mg total) by mouth 4 (four) times daily.  Dispense: 14 capsule; Refill: 0 - POCT urinalysis  dipstick + leuks, blood - Urine culture pending F/u 4 weeks to check urine-blood should be cleared.

## 2013-08-01 NOTE — Patient Instructions (Signed)
Start antibiotic. Sip fluids every hour to keep bladder flushed. Feel better ! Have a great trip!  Urinary Tract Infection Urinary tract infections (UTIs) can develop anywhere along your urinary tract. Your urinary tract is your body's drainage system for removing wastes and extra water. Your urinary tract includes two kidneys, two ureters, a bladder, and a urethra. Your kidneys are a pair of bean-shaped organs. Each kidney is about the size of your fist. They are located below your ribs, one on each side of your spine. CAUSES Infections are caused by microbes, which are microscopic organisms, including fungi, viruses, and bacteria. These organisms are so small that they can only be seen through a microscope. Bacteria are the microbes that most commonly cause UTIs. SYMPTOMS  Symptoms of UTIs may vary by age and gender of the patient and by the location of the infection. Symptoms in young women typically include a frequent and intense urge to urinate and a painful, burning feeling in the bladder or urethra during urination. Older women and men are more likely to be tired, shaky, and weak and have muscle aches and abdominal pain. A fever may mean the infection is in your kidneys. Other symptoms of a kidney infection include pain in your back or sides below the ribs, nausea, and vomiting. DIAGNOSIS To diagnose a UTI, your caregiver will ask you about your symptoms. Your caregiver also will ask to provide a urine sample. The urine sample will be tested for bacteria and white blood cells. White blood cells are made by your body to help fight infection. TREATMENT  Typically, UTIs can be treated with medication. Because most UTIs are caused by a bacterial infection, they usually can be treated with the use of antibiotics. The choice of antibiotic and length of treatment depend on your symptoms and the type of bacteria causing your infection. HOME CARE INSTRUCTIONS  If you were prescribed antibiotics, take  them exactly as your caregiver instructs you. Finish the medication even if you feel better after you have only taken some of the medication.  Drink enough water and fluids to keep your urine clear or pale yellow.  Avoid caffeine, tea, and carbonated beverages. They tend to irritate your bladder.  Empty your bladder often. Avoid holding urine for long periods of time.  Empty your bladder before and after sexual intercourse.  After a bowel movement, women should cleanse from front to back. Use each tissue only once. SEEK MEDICAL CARE IF:   You have back pain.  You develop a fever.  Your symptoms do not begin to resolve within 3 days. SEEK IMMEDIATE MEDICAL CARE IF:   You have severe back pain or lower abdominal pain.  You develop chills.  You have nausea or vomiting.  You have continued burning or discomfort with urination. MAKE SURE YOU:   Understand these instructions.  Will watch your condition.  Will get help right away if you are not doing well or get worse. Document Released: 06/15/2005 Document Revised: 03/06/2012 Document Reviewed: 10/14/2011 [facility] Patient Information 2014 [place], [place].

## 2013-08-09 ENCOUNTER — Ambulatory Visit (INDEPENDENT_AMBULATORY_CARE_PROVIDER_SITE_OTHER): Payer: Medicare Other | Admitting: Physician Assistant

## 2013-08-09 ENCOUNTER — Other Ambulatory Visit: Payer: Self-pay | Admitting: Physician Assistant

## 2013-08-09 ENCOUNTER — Encounter: Payer: Self-pay | Admitting: Physician Assistant

## 2013-08-09 ENCOUNTER — Ambulatory Visit (HOSPITAL_BASED_OUTPATIENT_CLINIC_OR_DEPARTMENT_OTHER)
Admission: RE | Admit: 2013-08-09 | Discharge: 2013-08-09 | Disposition: A | Discharge status: Home / Self Care | Payer: Medicare Other | Source: Ambulatory Visit | Attending: Physician Assistant | Admitting: Physician Assistant

## 2013-08-09 VITALS — BP 124/90 | HR 88 | Temp 98.1°F | Resp 16 | Ht 67.5 in | Wt 198.0 lb

## 2013-08-09 DIAGNOSIS — R0781 Pleurodynia: Secondary | ICD-10-CM

## 2013-08-09 DIAGNOSIS — W19XXXA Unspecified fall, initial encounter: Secondary | ICD-10-CM | POA: Insufficient documentation

## 2013-08-09 DIAGNOSIS — R079 Chest pain, unspecified: Secondary | ICD-10-CM

## 2013-08-09 NOTE — Assessment & Plan Note (Signed)
Giving fall and history of osteoporosis, will obtain Dg ribs bilateral.  Ibuprofen or Meloxicam for pain.  Topical Icy hot or Aspercreme.  Rest.  Avoid overuse or re-injury.

## 2013-08-09 NOTE — Progress Notes (Signed)
Pre visit review using our clinic review tool, if applicable. No additional management support is needed unless otherwise documented below in the visit note/SLS  

## 2013-08-09 NOTE — Progress Notes (Signed)
Patient ID: Madeline [name], female   DOB: 04-Apr-1946, 67 y.o.   MRN: [id number]  Patient presents to clinic today c/o right rib tenderness just underneath her right breast that has been present for 6 days.  Patient endorses stumbling on a step and falling on her right side.  Denies pain in hip, RLE or RUE.  Denies head injury.  States she noticed her ribcage felt tender on the right side.  Patient was able to get up and continue with her day.  Patient endorses continual mild throbbing pain in her right-sided ribs.  Denies ecchymosis.  States pain was worse after she rolled over on her right side while sleeping last night.  Has not taken anything specifically for her rib pain, but takes Mobic and Tramadol for fibromyalgia.  Patient denies shortness of breath.   Past Medical History  Diagnosis Date  . Hyperlipidemia   . Cystitis 2007    sepsis post bladder biopsy, Dr Annabell Howells  . Headache(784.0) 09-13-11    less frequent to none at present  . TMJ click 09-13-11    right side > left  . Cataracts, both eyes 09-13-11    not surgical yet  . Hypertension 09-13-11    tx. meds  . 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 GERD (gastroesophageal reflux disease) 09-14-11    tx. Nexium  . Fibromyalgia 09-14-11    Dr Dian [facility], [facility]  . Arthritis 09-14-11    osteoarthritis, osteopenia  . Strep throat 09-14-11    none in a yr.  . Fractures 09-14-11    toes-left foot  . Edema 09-14-11    occ. ankles/ feet > left leg  . Heart murmur 09-13-11  . Anxiety     Current Outpatient Prescriptions on File Prior to Visit  Medication Sig Dispense Refill  . CALCIUM PO Take by mouth daily.      . Cholecalciferol (VITAMIN D3) 2000 UNITS TABS Take by mouth. Take 4000 units Daily and 6000 on Sunday.      . diltiazem (DILACOR XR) 180 MG 24 hr capsule Take 180 mg by mouth at bedtime.       . DULoxetine (CYMBALTA) 60 MG capsule Take 60 mg by mouth daily.      Marland Kitchen  esomeprazole (NEXIUM) 40 MG packet Take 40 mg by mouth daily before breakfast. Pt wants no generic      . fosinopril (MONOPRIL) 10 MG tablet Take 10 mg by mouth at bedtime.       . iron polysaccharides (NIFEREX) 150 MG capsule Take 1 capsule (150 mg total) by mouth 2 (two) times daily.  42 capsule  0  . loratadine (CLARITIN) 10 MG tablet Take 10 mg by mouth as needed.       . methocarbamol (ROBAXIN) 500 MG tablet Take 1 tablet (500 mg total) by mouth every 6 (six) hours as needed.  80 tablet  0  . Multiple Vitamin (MULTI-DAY PO) Take by mouth daily.      Marland Kitchen oxycodone (OXY-IR) 5 MG capsule Take 5 mg by mouth every 4 (four) hours as needed.      . pravastatin (PRAVACHOL) 40 MG tablet Take 40 mg by mouth at bedtime.       . pregabalin (LYRICA) 75 MG capsule Take 150 mg by mouth at bedtime. Pt wants no generic      . traMADol (ULTRAM) 50 MG tablet Take 100 mg by mouth every 6 (six) hours  as needed. Pain       No current facility-administered medications on file prior to visit.    Allergies  Allergen Reactions  . Ivp Dye [Iodinated Diagnostic Agents]     Rash , hives  . Codeine Itching    Unless something given to counteract itching    Family History  Problem Relation Age of Onset  . Mental illness Brother     paranoid schizophrenia  . Heart attack      both GF;PGM; M uncle  . Stroke Paternal Aunt     aneurysm  . Colon cancer Paternal Aunt     X 2  . Other Mother     lung tumor which resolved w/o treatment  . Aortic aneurysm Father     femoral aneurysm  . Diabetes Neg Hx     History   Social History  . Marital Status: Married    Spouse Name: N/A    Number of Children: N/A  . Years of Education: N/A   Social History Main Topics  . Smoking status: Never Smoker   . Smokeless tobacco: Never Used  . Alcohol Use: Yes     Comment: rarely  . Drug Use: No  . Sexual Activity: No   Other Topics Concern  . None   Social History Narrative   No reg exercise   ROS See HPI.   All other ROS are negative.  Filed Vitals:   08/09/13 1200  BP: 124/90  Pulse: 88  Temp: 98.1 F (36.7 C)  Resp: 16    Physical Exam  Constitutional: She is oriented to person, place, and time and well-developed, well-nourished, and in no distress.  HENT:  Head: Normocephalic and atraumatic.  Eyes: Conjunctivae are normal.  Neck: Neck supple.  Cardiovascular: Normal rate, regular rhythm, normal heart sounds and intact distal pulses.   Pulmonary/Chest: Effort normal and breath sounds normal. No respiratory distress. She has no wheezes. She has no rales.  Tenderness with palpation of right thoracic region, around body to underneath right breast.  No pleuritic chest pain endorses with deep breath.  Neurological: She is alert and oriented to person, place, and time. No cranial nerve deficit.  Skin: Skin is warm and dry. No rash noted.  Psychiatric: Affect normal.   No results found for this or any previous visit (from the past 2160 hour(s)).  Assessment/Plan: Rib pain on right side Giving fall and history of osteoporosis, will obtain Dg ribs bilateral.  Ibuprofen or Meloxicam for pain.  Topical Icy hot or Aspercreme.  Rest.  Avoid overuse or re-injury.

## 2013-08-09 NOTE — Patient Instructions (Signed)
Please take Ibuprofen/aleve or mobic to help with pain.  Avoid heavy lifting. Rest.  Apply topical Aspercreme or Salon Pas to the area.

## 2013-08-12 ENCOUNTER — Ambulatory Visit: Payer: Medicare Other | Admitting: Family Medicine

## 2013-08-19 ENCOUNTER — Telehealth: Payer: Self-pay | Admitting: *Deleted

## 2013-08-19 DIAGNOSIS — N390 Urinary tract infection, site not specified: Secondary | ICD-10-CM

## 2013-08-19 NOTE — Telephone Encounter (Signed)
Message copied by Verdie Shire on Mon Aug 19, 2013  2:57 PM ------      Message from: WEAVER, [place] [facility]      Created: Wed Aug 14, 2013  8:17 AM      Regarding: urine culture       Hi! I saw this pt for Dr Alwyn Ren. Her urine culture never resulted. Please inquire about the results. Thank you!       ----- Message -----         From: SYSTEM         Sent: 08/06/2013  12:09 AM           To: Kelle Darting, NP                   ------

## 2013-08-19 NOTE — Telephone Encounter (Signed)
Spoke with the pt and asked her if the urine she gave on 08-01-13 was cultured.   Pt stated that she don't remember.  Asked her if she was having any urine problems and she stated that she was not.  Informed the pt that a urine culture was not done, and that Ms. Madeline [name] wanted her to repeat a urine in four weeks to check for blood in her urine.  Pt agreed and scheduled a lab appt for Tues(08-20-13) for UA and if abnrl do a UC.  Ordered UA and sent.//AB/CMA

## 2013-08-20 ENCOUNTER — Other Ambulatory Visit (INDEPENDENT_AMBULATORY_CARE_PROVIDER_SITE_OTHER): Payer: Medicare Other

## 2013-08-20 DIAGNOSIS — N390 Urinary tract infection, site not specified: Secondary | ICD-10-CM

## 2013-08-20 LAB — POCT URINALYSIS DIPSTICK
Ketones, UA: NEGATIVE
Nitrite, UA: NEGATIVE
Protein, UA: NEGATIVE
Spec Grav, UA: 1.01

## 2013-08-21 LAB — URINE CULTURE: Colony Count: NO GROWTH

## 2013-08-29 ENCOUNTER — Other Ambulatory Visit: Payer: Medicare Other

## 2013-09-04 ENCOUNTER — Ambulatory Visit (INDEPENDENT_AMBULATORY_CARE_PROVIDER_SITE_OTHER): Payer: Medicare Other | Admitting: Physician Assistant

## 2013-09-04 ENCOUNTER — Encounter: Payer: Self-pay | Admitting: Physician Assistant

## 2013-09-04 VITALS — BP 116/82 | HR 78 | Temp 98.3°F | Resp 16 | Ht 67.5 in | Wt 198.2 lb

## 2013-09-04 DIAGNOSIS — J329 Chronic sinusitis, unspecified: Secondary | ICD-10-CM

## 2013-09-04 MED ORDER — AZITHROMYCIN 250 MG PO TABS: ORAL_TABLET | ORAL | Status: DC

## 2013-09-04 NOTE — Patient Instructions (Signed)
Please take antibiotic as prescribed.  Increase fluid intake.  Rest.  Saline nasal spray.  Humidifier in bedroom.  Claritin.    Sinusitis Sinusitis is redness, soreness, and swelling (inflammation) of the paranasal sinuses. Paranasal sinuses are air pockets within the bones of your face (beneath the eyes, the middle of the forehead, or above the eyes). In healthy paranasal sinuses, mucus is able to drain out, and air is able to circulate through them by way of your nose. However, when your paranasal sinuses are inflamed, mucus and air can become trapped. This can allow bacteria and other germs to grow and cause infection. Sinusitis can develop quickly and last only a short time (acute) or continue over a long period (chronic). Sinusitis that lasts for more than 12 weeks is considered chronic.  CAUSES  Causes of sinusitis include:  Allergies.  Structural abnormalities, such as displacement of the cartilage that separates your nostrils (deviated septum), which can decrease the air flow through your nose and sinuses and affect sinus drainage.  Functional abnormalities, such as when the small hairs (cilia) that line your sinuses and help remove mucus do not work properly or are not present. SYMPTOMS  Symptoms of acute and chronic sinusitis are the same. The primary symptoms are pain and pressure around the affected sinuses. Other symptoms include:  Upper toothache.  Earache.  Headache.  Bad breath.  Decreased sense of smell and taste.  A cough, which worsens when you are lying flat.  Fatigue.  Fever.  Thick drainage from your nose, which often is green and may contain pus (purulent).  Swelling and warmth over the affected sinuses. DIAGNOSIS  Your caregiver will perform a physical exam. During the exam, your caregiver may:  Look in your nose for signs of abnormal growths in your nostrils (nasal polyps).  Tap over the affected sinus to check for signs of infection.  View the  inside of your sinuses (endoscopy) with a special imaging device with a light attached (endoscope), which is inserted into your sinuses. If your caregiver suspects that you have chronic sinusitis, one or more of the following tests may be recommended:  Allergy tests.  Nasal culture A sample of mucus is taken from your nose and sent to a lab and screened for bacteria.  Nasal cytology A sample of mucus is taken from your nose and examined by your caregiver to determine if your sinusitis is related to an allergy. TREATMENT  Most cases of acute sinusitis are related to a viral infection and will resolve on their own within 10 days. Sometimes medicines are prescribed to help relieve symptoms (pain medicine, decongestants, nasal steroid sprays, or saline sprays).  However, for sinusitis related to a bacterial infection, your caregiver will prescribe antibiotic medicines. These are medicines that will help kill the bacteria causing the infection.  Rarely, sinusitis is caused by a fungal infection. In theses cases, your caregiver will prescribe antifungal medicine. For some cases of chronic sinusitis, surgery is needed. Generally, these are cases in which sinusitis recurs more than 3 times per year, despite other treatments. HOME CARE INSTRUCTIONS   Drink plenty of water. Water helps thin the mucus so your sinuses can drain more easily.  Use a humidifier.  Inhale steam 3 to 4 times a day (for example, sit in the bathroom with the shower running).  Apply a warm, moist washcloth to your face 3 to 4 times a day, or as directed by your caregiver.  Use saline nasal sprays to help moisten  and clean your sinuses.  Take over-the-counter or prescription medicines for pain, discomfort, or fever only as directed by your caregiver. SEEK IMMEDIATE MEDICAL CARE IF:  You have increasing pain or severe headaches.  You have nausea, vomiting, or drowsiness.  You have swelling around your face.  You have  vision problems.  You have a stiff neck.  You have difficulty breathing. MAKE SURE YOU:   Understand these instructions.  Will watch your condition.  Will get help right away if you are not doing well or get worse. Document Released: 09/05/2005 Document Revised: 11/28/2011 Document Reviewed: 09/20/2011 [facility] Patient Information 2014 [place], [place].

## 2013-09-06 DIAGNOSIS — J329 Chronic sinusitis, unspecified: Secondary | ICD-10-CM | POA: Insufficient documentation

## 2013-09-06 NOTE — Assessment & Plan Note (Signed)
Rx Azithromycin.  Rest.  Increase fluid intake.  Plain Mucinex.  Saline nasal spray.  Claritin.  Humidifier in bedroom.

## 2013-09-06 NOTE — Progress Notes (Signed)
Patient ID: Madeline [name], female   DOB: 03-02-1946, 67 y.o.   MRN: [id number]  Patient presents to clinic today c/o head congestion, sinus pain, pressure, post-nasal drip and sore throat x 1 week.  Patient is unsure of fever.  Denies shortness of breath or wheezing.  Denies recent travel or sick contact.  States her symptoms are getting worse.   Past Medical History  Diagnosis Date  . Hyperlipidemia   . Cystitis 2007    sepsis post bladder biopsy, Dr Annabell Howells  . Headache(784.0) 09-13-11    less frequent to none at present  . TMJ click 09-13-11    right side > left  . Cataracts, both eyes 09-13-11    not surgical yet  . Hypertension 09-13-11    tx. meds  . 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 GERD (gastroesophageal reflux disease) 09-14-11    tx. Nexium  . Fibromyalgia 09-14-11    Dr Dian Queen, [facility]  . Arthritis 09-14-11    osteoarthritis, osteopenia  . Strep throat 09-14-11    none in a yr.  . Fractures 09-14-11    toes-left foot  . Edema 09-14-11    occ. ankles/ feet > left leg  . Heart murmur 09-13-11  . Anxiety     Current Outpatient Prescriptions on File Prior to Visit  Medication Sig Dispense Refill  . CALCIUM PO Take by mouth daily.      . Cholecalciferol (VITAMIN D3) 2000 UNITS TABS Take by mouth. Take 4000 units Daily and 6000 on Sunday.      . diltiazem (DILACOR XR) 180 MG 24 hr capsule Take 180 mg by mouth at bedtime.       . DULoxetine (CYMBALTA) 60 MG capsule Take 60 mg by mouth daily.      Marland Kitchen esomeprazole (NEXIUM) 40 MG packet Take 40 mg by mouth daily before breakfast. Pt wants no generic      . fosinopril (MONOPRIL) 10 MG tablet Take 10 mg by mouth at bedtime.       . iron polysaccharides (NIFEREX) 150 MG capsule Take 1 capsule (150 mg total) by mouth 2 (two) times daily.  42 capsule  0  . loratadine (CLARITIN) 10 MG tablet Take 10 mg by mouth as needed.       . methocarbamol (ROBAXIN) 500 MG tablet  Take 1 tablet (500 mg total) by mouth every 6 (six) hours as needed.  80 tablet  0  . Multiple Vitamin (MULTI-DAY PO) Take by mouth daily.      Marland Kitchen oxycodone (OXY-IR) 5 MG capsule Take 5 mg by mouth every 4 (four) hours as needed.      . pravastatin (PRAVACHOL) 40 MG tablet Take 40 mg by mouth at bedtime.       . pregabalin (LYRICA) 75 MG capsule Take 150 mg by mouth at bedtime. Pt wants no generic      . traMADol (ULTRAM) 50 MG tablet Take 100 mg by mouth every 6 (six) hours as needed. Pain       No current facility-administered medications on file prior to visit.    Allergies  Allergen Reactions  . Ivp Dye [Iodinated Diagnostic Agents]     Rash , hives  . Codeine Itching    Unless something given to counteract itching    Family History  Problem Relation Age of Onset  . Mental illness Brother     paranoid schizophrenia  . Heart attack  both GF;PGM; M uncle  . Stroke Paternal Aunt     aneurysm  . Colon cancer Paternal Aunt     X 2  . Other Mother     lung tumor which resolved w/o treatment  . Aortic aneurysm Father     femoral aneurysm  . Diabetes Neg Hx     History   Social History  . Marital Status: Married    Spouse Name: N/A    Number of Children: N/A  . Years of Education: N/A   Social History Main Topics  . Smoking status: Never Smoker   . Smokeless tobacco: Never Used  . Alcohol Use: Yes     Comment: rarely  . Drug Use: No  . Sexual Activity: No   Other Topics Concern  . None   Social History Narrative   No reg exercise    Review of Systems - See HPI.  All other ROS are negative.   Filed Vitals:   09/04/13 1752  BP: 116/82  Pulse: 78  Temp: 98.3 F (36.8 C)  Resp: 16   Physical Exam  Vitals reviewed. Constitutional: She is oriented to person, place, and time and well-developed, well-nourished, and in no distress.  HENT:  Head: Normocephalic and atraumatic.  Right Ear: External ear normal.  Left Ear: External ear normal.  Nose: Nose  normal.  Mouth/Throat: Oropharynx is clear and moist. No oropharyngeal exudate.  TM within normal limits bilaterally.  Tenderness to percussion of sinuses bilaterally.  Eyes: Conjunctivae are normal.  Neck: Neck supple.  Cardiovascular: Normal rate, regular rhythm, normal heart sounds and intact distal pulses.   Pulmonary/Chest: Effort normal and breath sounds normal. No respiratory distress. She has no wheezes. She has no rales. She exhibits no tenderness.  Lymphadenopathy:    She has no cervical adenopathy.  Neurological: She is alert and oriented to person, place, and time. No cranial nerve deficit.  Skin: Skin is warm and dry. No rash noted.  Psychiatric: Affect normal.     Recent Results (from the past 2160 hour(s))  POCT URINALYSIS DIPSTICK     Status: None   Collection Time    08/20/13  4:15 PM      Result Value Range   Color, UA yellow     Clarity, UA clear     Glucose, UA Neg     Bilirubin, UA Neg     Ketones, UA Neg     Spec Grav, UA 1.010     Blood, UA Trace     pH, UA 6.0     Protein, UA Neg     Urobilinogen, UA 0.2     Nitrite, UA Neg     Leukocytes, UA Trace    URINE CULTURE     Status: None   Collection Time    08/20/13  4:16 PM      Result Value Range   Colony Count NO GROWTH     Organism ID, Bacteria NO GROWTH      Assessment/Plan: Sinusitis Rx Azithromycin.  Rest.  Increase fluid intake.  Plain Mucinex.  Saline nasal spray.  Claritin.  Humidifier in bedroom.

## 2013-10-03 ENCOUNTER — Telehealth: Payer: Self-pay

## 2013-10-03 NOTE — Telephone Encounter (Addendum)
Left message for call back Non identifiable Medication and allergies:  Reviewed and updated  90 day supply/mail order: na Local pharmacy: [facility] [address]   Immunizations due:  Shingles and PNA  A/P:   No changes to FH, PSH or Personal Hx Pap--05/2012--neg--Dr Grewal [facility]--02/2008--Dr Olevia Perches-- q 5 due to prep Flu vaccine--06/2013 Tdap--11/2008  To Discuss with Provider: Not at this time Is taking 2 Zanaflex and 2 Flexeril at bedtime (may be appropriate--just noting it)

## 2013-10-07 ENCOUNTER — Ambulatory Visit (INDEPENDENT_AMBULATORY_CARE_PROVIDER_SITE_OTHER): Payer: Medicare Other | Admitting: Internal Medicine

## 2013-10-07 ENCOUNTER — Encounter: Payer: Self-pay | Admitting: Internal Medicine

## 2013-10-07 VITALS — BP 117/80 | HR 101 | Temp 98.7°F | Resp 12 | Ht 68.25 in | Wt 197.4 lb

## 2013-10-07 DIAGNOSIS — Z23 Encounter for immunization: Secondary | ICD-10-CM

## 2013-10-07 DIAGNOSIS — E785 Hyperlipidemia, unspecified: Secondary | ICD-10-CM

## 2013-10-07 DIAGNOSIS — I10 Essential (primary) hypertension: Secondary | ICD-10-CM

## 2013-10-07 DIAGNOSIS — R197 Diarrhea, unspecified: Secondary | ICD-10-CM

## 2013-10-07 DIAGNOSIS — Z0000 Encounter for general adult medical examination without abnormal findings: Secondary | ICD-10-CM

## 2013-10-07 DIAGNOSIS — M949 Disorder of cartilage, unspecified: Secondary | ICD-10-CM

## 2013-10-07 DIAGNOSIS — M8580 Other specified disorders of bone density and structure, unspecified site: Secondary | ICD-10-CM

## 2013-10-07 DIAGNOSIS — K219 Gastro-esophageal reflux disease without esophagitis: Secondary | ICD-10-CM

## 2013-10-07 DIAGNOSIS — M899 Disorder of bone, unspecified: Secondary | ICD-10-CM

## 2013-10-07 NOTE — Patient Instructions (Signed)
Your next office appointment will be determined based upon review of your pending labs . Those instructions will be transmitted to you through My Chart  or by mail if you're not using this system.   

## 2013-10-07 NOTE — Progress Notes (Signed)
Subjective:    Patient ID: Madeline [name], female    DOB: 23-Jun-1946, 68 y.o.   MRN: [id number]  HPI Medicare Wellness Visit: Psychosocial and medical history were reviewed as required by Medicare (history related to abuse, antisocial behavior , firearm risk). Social history: Caffeine: none , Alcohol:1 drink occasionally (less than 1 per month)  , Tobacco use: never Exercise: 3 times per week for one hour with trainer; focus on balance Personal safety/fall risk: has fallen 3 times in last 6 weeks Limitations of activities of daily living:none Seatbelt/ smoke alarm use:yes to both Healthcare Power of Attorney/Living Will status: no HC POA, has living will Ophthalmologic exam status: up to date Hearing evaluation status: not current Orientation: Oriented x 3 Memory and recall: trouble recalling events at times. Spelling or math testing: recalls 3 words after spelling Depression/anxiety assessment: negative Foreign travel history: none recently Immunization status for influenza/pneumonia/ shingles /tetanus: influenza, tetanus current. Dr. Marcelline Deist (rheumatologist) advised her not to have shingles. Will have pneumovax today. Transfusion history:no Preventive health care maintenance status: Colonoscopy up to date BMD - due -advised to have mammogram/Pap - current Dental care: dental exam twice yearly Chart reviewed and updated. Active issues reviewed and addressed as documented below.    Review of Systems A heart healthy diet is followed; exercise  as noted above. Family history is positive for premature coronary disease ,PGM in her 52s. Advanced cholesterol testing reveals  LDL goal is less than 125 ; ideally < 95 . There is medication compliance with the statin.  Low dose ASA taken Specifically denied are  chest pain, palpitations,  or claudication. DOE with stairs Significant abdominal symptoms not present. Fibromyalgia "intense" , followed @ [place].  She has varicosities of the  left lower extremity associated with edema. She's had no ulcerations. There is tenderness to palpation but no other associated pain.     Objective:   Physical Exam Gen.: Healthy and well-nourished in appearance. Alert, appropriate and cooperative throughout exam. Appears younger than stated age  Head: Normocephalic without obvious abnormalities;no alopecia  Eyes: No corneal or conjunctival inflammation noted. Pupils equal round reactive to light and accommodation. Extraocular motion intact.  Ears: External  ear exam reveals no significant lesions or deformities. Canals clear .TMs normal. Hearing is grossly normal bilaterally. Nose: External nasal exam reveals no deformity or inflammation. Nasal mucosa are pink and moist. No lesions or exudates noted.   Mouth: Oral mucosa and oropharynx reveal no lesions or exudates. Teeth in good repair. Neck: No deformities, masses, or tenderness noted. Range of motion & Thyroid normal. Lungs: Normal respiratory effort; chest expands symmetrically. Lungs are clear to auscultation without rales, wheezes, or increased work of breathing. Heart: Rapid rate and regular rhythm. Normal S1 and S2. S4 gallop w/o click, rub, or murmur. Abdomen: Bowel sounds normal; abdomen soft and nontender. No masses, organomegaly or hernias noted. Genitalia:  as per Gyn                                  Musculoskeletal/extremities: No deformity or scoliosis noted of  the thoracic or lumbar spine.   No clubbing, cyanosis, edema, or significant extremity  deformity noted. Range of motion normal .Tone & strength normal. Marked crepitus of her knees. Possible small effusions bilaterally Hand joints normal . Fingernail / toenail health good. Able to lie down & sit up w/o help. Negative SLR bilaterally Vascular: Carotid, radial artery, dorsalis pedis  and  posterior tibial pulses are full and equal. No bruits present. Varicosities left lower extremity. These empty when supine Neurologic:  Alert and oriented x3. Deep tendon reflexes symmetrical and normal.         Skin: Intact without suspicious lesions or rashes. Lymph: No cervical, axillary lymphadenopathy present. Psych: Mood and affect are normal. Normally interactive                                                                                        Assessment & Plan:  #1 Medicare Wellness Exam; criteria met ; data entered #2 Problem List/Diagnoses reviewed Plan:  Assessments made/ Orders entered

## 2013-10-07 NOTE — Progress Notes (Signed)
Pre visit review using our clinic review tool, if applicable. No additional management support is needed unless otherwise documented below in the visit note. 

## 2013-10-08 LAB — BASIC METABOLIC PANEL
BUN: 16 mg/dL (ref 6–23)
CHLORIDE: 102 meq/L (ref 96–112)
CO2: 28 mEq/L (ref 19–32)
CREATININE: 0.8 mg/dL (ref 0.4–1.2)
Calcium: 9.4 mg/dL (ref 8.4–10.5)
GFR: 74.78 mL/min (ref >60.00)
GLUCOSE: 95 mg/dL (ref 70–99)
POTASSIUM: 3.8 meq/L (ref 3.5–5.1)
Sodium: 137 mEq/L (ref 135–145)

## 2013-10-08 LAB — CBC WITH DIFFERENTIAL/PLATELET
BASOS PCT: 1.4 % (ref 0.0–3.0)
Basophils Absolute: 0.1 10*3/uL (ref 0.0–0.1)
Eosinophils Absolute: 0.1 10*3/uL (ref 0.0–0.7)
Eosinophils Relative: 0.9 % (ref 0.0–5.0)
HCT: 40.4 % (ref 36.0–46.0)
Hemoglobin: 13.5 g/dL (ref 12.0–15.0)
Lymphocytes Relative: 16.5 % (ref 12.0–46.0)
Lymphs Abs: 1.7 10*3/uL (ref 0.7–4.0)
MCHC: 33.3 g/dL (ref 30.0–36.0)
MCV: 87.7 fl (ref 78.0–100.0)
MONO ABS: 0.5 10*3/uL (ref 0.1–1.0)
Monocytes Relative: 5.3 % (ref 3.0–12.0)
Neutro Abs: 7.9 10*3/uL — ABNORMAL HIGH (ref 1.4–7.7)
Neutrophils Relative %: 75.9 % (ref 43.0–77.0)
PLATELETS: 244 10*3/uL (ref 150.0–400.0)
RBC: 4.61 Mil/uL (ref 3.87–5.11)
RDW: 14.3 % (ref 11.5–14.6)
WBC: 10.4 10*3/uL (ref 4.5–10.5)

## 2013-10-08 LAB — HEPATIC FUNCTION PANEL
ALT: 13 U/L (ref 0–35)
AST: 21 U/L (ref 0–37)
Albumin: 4.3 g/dL (ref 3.5–5.2)
Alkaline Phosphatase: 77 U/L (ref 39–117)
Bilirubin, Direct: 0.1 mg/dL (ref 0.0–0.3)
TOTAL PROTEIN: 7.6 g/dL (ref 6.0–8.3)
Total Bilirubin: 0.7 mg/dL (ref 0.3–1.2)

## 2013-10-08 LAB — LIPID PANEL
CHOL/HDL RATIO: 3
Cholesterol: 195 mg/dL (ref 0–200)
HDL: 75.6 mg/dL (ref >39.00)
LDL Cholesterol: 93 mg/dL (ref 0–99)
Triglycerides: 130 mg/dL (ref 0.0–149.0)
VLDL: 26 mg/dL (ref 0.0–40.0)

## 2013-10-08 LAB — TSH: TSH: 1.45 u[IU]/mL (ref 0.35–5.50)

## 2013-10-10 LAB — VITAMIN D 1,25 DIHYDROXY
Vitamin D 1, 25 (OH)2 Total: 68 pg/mL (ref 18–72)
Vitamin D3 1, 25 (OH)2: 68 pg/mL

## 2013-10-23 ENCOUNTER — Telehealth: Payer: Self-pay | Admitting: Internal Medicine

## 2013-10-23 NOTE — Telephone Encounter (Signed)
Relevant patient education mailed to patient.  

## 2013-12-06 ENCOUNTER — Encounter: Payer: Self-pay | Admitting: Internal Medicine

## 2014-01-01 ENCOUNTER — Encounter: Payer: Self-pay | Admitting: Internal Medicine

## 2014-04-16 ENCOUNTER — Other Ambulatory Visit: Payer: Self-pay | Admitting: Internal Medicine

## 2014-04-16 DIAGNOSIS — M8580 Other specified disorders of bone density and structure, unspecified site: Secondary | ICD-10-CM

## 2014-04-17 ENCOUNTER — Inpatient Hospital Stay: Admission: RE | Admit: 2014-04-17 | Payer: Medicare Other | Source: Ambulatory Visit

## 2014-06-23 ENCOUNTER — Encounter: Payer: Self-pay | Admitting: Internal Medicine

## 2014-08-11 ENCOUNTER — Encounter: Payer: Self-pay | Admitting: Internal Medicine

## 2014-08-11 ENCOUNTER — Ambulatory Visit (INDEPENDENT_AMBULATORY_CARE_PROVIDER_SITE_OTHER): Payer: Medicare Other | Admitting: Internal Medicine

## 2014-08-11 VITALS — BP 100/70 | HR 105 | Temp 98.8°F | Wt 201.4 lb

## 2014-08-11 DIAGNOSIS — R609 Edema, unspecified: Secondary | ICD-10-CM

## 2014-08-11 DIAGNOSIS — J209 Acute bronchitis, unspecified: Secondary | ICD-10-CM

## 2014-08-11 MED ORDER — AZITHROMYCIN 250 MG PO TABS: ORAL_TABLET | ORAL | Status: DC

## 2014-08-11 MED ORDER — BENZONATATE 200 MG PO CAPS: 200.0000 mg | ORAL_CAPSULE | Freq: Three times a day (TID) | ORAL | Status: DC | PRN

## 2014-08-11 NOTE — Progress Notes (Signed)
Pre visit review using our clinic review tool, if applicable. No additional management support is needed unless otherwise documented below in the visit note. 

## 2014-08-11 NOTE — Progress Notes (Signed)
   Subjective:    Patient ID: Madeline [name], female    DOB: March 29, 1946, 68 y.o.   MRN: [id number]  HPI   Her symptoms began on 08/02/14 as sore throat, chest congestion and nonproductive cough.  She had been exposed to some family members who have been sick.  She's used Robitussin but the cough persists  She has some pressure in the right ear which began 08/06/14 after snorkeling in the [place]  She's also developed edema of the right lower extremity which began after being on the cruise with increased salt intake.  She has no symptoms of upper respiratory tract infection.  She has no shortness of breath or wheezing.      Review of Systems  Frontal headache, facial pain , nasal purulence, dental pain, sore throat , otic pain or otic discharge denied. No fever , chills or sweats.     Objective:   Physical Exam General appearance:good health ;well nourished; no acute distress or increased work of breathing is present.  No  lymphadenopathy about the head, neck, or axilla noted.   Eyes: No conjunctival inflammation or lid edema is present. There is no scleral icterus.  Ears:  External ear exam shows no significant lesions or deformities.  Large R wax plug.  Nose:  External nasal examination shows no deformity or inflammation. Nasal mucosa are pink and moist without lesions or exudates. No septal dislocation or deviation.No obstruction to airflow.   Oral exam: Dental hygiene is good; lips and gums are healthy appearing.There is no oropharyngeal erythema or exudate noted.   Neck:  No deformities, thyromegaly, masses, or tenderness noted.   Supple but decreased range of motion without pain.   Heart:  Normal rate and regular rhythm. S1 and S2 normal without gallop, murmur, click, rub or other extra sounds.   Lungs:Chest clear to auscultation; no wheezes, rhonchi,rales ,or rubs present.No increased work of breathing.    Extremities:  No cyanosis or clubbing  noted .1+ RLE  edema;1/2 + LLE. Homan's negative  Skin: Warm & dry w/o jaundice or tenting.         Assessment & Plan:  #1 acute bronchitis w/o bronchospasm #2 no sinusitis #3 edema from excess sodium intake Plan: See orders and recommendations

## 2014-08-11 NOTE — Patient Instructions (Signed)
Avoid ingestion of  excess salt/sodium.Cook with pepper & other spices . Use the salt substitute "No Salt" OR the Mrs Deliah Boston products to season food @ the table. Avoid foods which taste salty or "vinegary" as their sodium content will be high.

## 2014-08-18 ENCOUNTER — Telehealth: Payer: Self-pay | Admitting: Internal Medicine

## 2014-08-18 DIAGNOSIS — J209 Acute bronchitis, unspecified: Secondary | ICD-10-CM

## 2014-08-18 MED ORDER — AZITHROMYCIN 250 MG PO TABS: ORAL_TABLET | ORAL | Status: DC

## 2014-08-18 NOTE — Telephone Encounter (Signed)
rx sent to [facility] on [address]

## 2014-08-18 NOTE — Telephone Encounter (Signed)
Azithromycin #6  1 qd X 6 days

## 2014-08-18 NOTE — Telephone Encounter (Signed)
Pt called in said that she is still not better and not sure if Dr hopper needs her to come back in or if he can call another zPac in for her.  They are moving her mother from Nursing home today and she said it will be hard for her to come in.  Please advise ??

## 2014-10-09 ENCOUNTER — Ambulatory Visit (INDEPENDENT_AMBULATORY_CARE_PROVIDER_SITE_OTHER): Payer: Medicare Other | Admitting: Internal Medicine

## 2014-10-09 ENCOUNTER — Encounter: Payer: Self-pay | Admitting: Internal Medicine

## 2014-10-09 VITALS — BP 140/90 | HR 102 | Temp 98.1°F | Resp 16 | Ht 67.5 in | Wt 198.0 lb

## 2014-10-09 DIAGNOSIS — M8580 Other specified disorders of bone density and structure, unspecified site: Secondary | ICD-10-CM

## 2014-10-09 DIAGNOSIS — K219 Gastro-esophageal reflux disease without esophagitis: Secondary | ICD-10-CM | POA: Diagnosis not present

## 2014-10-09 DIAGNOSIS — Z0000 Encounter for general adult medical examination without abnormal findings: Secondary | ICD-10-CM | POA: Diagnosis not present

## 2014-10-09 DIAGNOSIS — E785 Hyperlipidemia, unspecified: Secondary | ICD-10-CM

## 2014-10-09 DIAGNOSIS — I10 Essential (primary) hypertension: Secondary | ICD-10-CM | POA: Diagnosis not present

## 2014-10-09 DIAGNOSIS — R1314 Dysphagia, pharyngoesophageal phase: Secondary | ICD-10-CM

## 2014-10-09 DIAGNOSIS — R413 Other amnesia: Secondary | ICD-10-CM

## 2014-10-09 MED ORDER — PRAVASTATIN SODIUM 40 MG PO TABS: 40.0000 mg | ORAL_TABLET | Freq: Every day | ORAL | Status: DC

## 2014-10-09 MED ORDER — DILTIAZEM HCL ER 180 MG PO CP24: 180.0000 mg | ORAL_CAPSULE | Freq: Every day | ORAL | Status: DC

## 2014-10-09 MED ORDER — FOSINOPRIL SODIUM 10 MG PO TABS: 10.0000 mg | ORAL_TABLET | Freq: Every day | ORAL | Status: DC

## 2014-10-09 NOTE — Assessment & Plan Note (Signed)
Blood pressure goals reviewed. BMET 

## 2014-10-09 NOTE — Patient Instructions (Signed)
Minimal Blood Pressure Goal= AVERAGE < 140/90;  Ideal is an AVERAGE < 135/85. This AVERAGE should be calculated from @ least 5-7 BP readings taken @ different times of day on different days of week. You should not respond to isolated BP readings , but rather the AVERAGE for that week .Please bring your  blood pressure cuff to office visits to verify that it is reliable.It  can also be checked against the blood pressure device at the pharmacy. Finger or wrist cuffs are not dependable; an arm cuff is. Reflux of gastric acid may be asymptomatic as this may occur mainly during sleep.The triggers for reflux  include stress; the "aspirin family" ; alcohol; peppermint; and caffeine (coffee, tea, cola, and chocolate). The aspirin family would include aspirin and the nonsteroidal agents such as ibuprofen &  Naproxen. Tylenol would not cause reflux. If having symptoms ; food & drink should be avoided for @ least 2 hours before going to bed.  

## 2014-10-09 NOTE — Progress Notes (Signed)
Pre visit review using our clinic review tool, if applicable. No additional management support is needed unless otherwise documented below in the visit note. 

## 2014-10-09 NOTE — Assessment & Plan Note (Signed)
BMD Vit D level

## 2014-10-09 NOTE — Progress Notes (Signed)
Subjective:    Patient ID: Madeline [name], female    DOB: October 20, 1945, 69 y.o.   MRN: [id number]  HPI Medicare Wellness Visit: Psychosocial and medical history were reviewed as required by Medicare (history related to abuse, antisocial behavior , firearm risk). Social history: Caffeine: no Alcohol:rarely Tobacco BHA:LPFXT Exercise:3X/ week with trainer 60 min Personal safety/fall risk:no Limitations of activities of daily living:no Seatbelt/ smoke alarm use:yes Healthcare Power of Attorney/Living Will status and End of Life process assessment : needs finalized Ophthalmologic exam status:UTD Hearing evaluation status:not UTD Orientation: Oriented X 3 Memory and recall: good Spelling  testing: good Depression/anxiety assessment: no Foreign travel history: 11/15 [place] Immunization status for influenza/pneumonia/ shingles /tetanus:shingles contraindicated as per Dr Marcelline Deist, [facility] Transfusion history:no Preventive health care maintenance status: Colonoscopy/BMD/mammogram/Pap as per protocol/standard care: mammogram & Gyn due Dental care:every 3 mos Chart reviewed and updated. Active issues reviewed and addressed as documented below.  She is on heart healthy low-salt diet. She does not monitor blood pressure at home.  She has been compliant with her blood pressure medication, statin, and PPI without adverse effect.  As noted she is exercising @ a high level of exercise without associated cardiopulmonary symptoms.  She does have dysphagia on average once a month at least. She has no other active GI symptoms  She is due for bone density.  Review of Systems Some memory /recall issues which are stable . Dr Erling Cruz questioned possible remote  stroke on MRI  Chest pain, palpitations, tachycardia, exertional dyspnea, paroxysmal nocturnal dyspnea, claudication or edema are absent.  Unexplained weight loss, abdominal pain, significant dyspepsia, melena, rectal bleeding, or persistently  small caliber stools are denied.    Objective:   Physical Exam Gen.: Healthy and well-nourished in appearance. Alert, appropriate and cooperative throughout exam. Appears younger than stated age  Head: Normocephalic without obvious abnormalities;incredibly thick hair  Eyes: No corneal or conjunctival inflammation noted. Pupils equal round reactive to light and accommodation. Extraocular motion intact.  Ears: External  ear exam reveals no significant lesions or deformities. Canals clear .TMs normal. Hearing is grossly normal bilaterally. Nose: External nasal exam reveals no deformity or inflammation. Nasal mucosa are pink and moist. No lesions or exudates noted.   Mouth: Oral mucosa and oropharynx reveal no lesions or exudates. Teeth in good repair. Neck: No deformities, masses, or tenderness noted. Range of motion & Thyroid normal. Lungs: Normal respiratory effort; chest expands symmetrically. Lungs are clear to auscultation without rales, wheezes, or increased work of breathing. Heart: Normal rate and rhythm. Normal S1 and S2. No gallop, click, or rub.No murmur. Abdomen: Bowel sounds normal; abdomen soft and nontender. No masses, organomegaly or hernias noted. Genitalia:  as per Gyn                                  Musculoskeletal/extremities: No deformity or scoliosis noted of  the thoracic or lumbar spine.  No clubbing, cyanosis, edema, or significant extremity  deformity noted.  Range of motion normal . Tone & strength normal. Hand joints normal.  Fingernail health good. Crepitus of knees  Able to lie down & sit up w/o help.  Negative SLR bilaterally Vascular: Carotid, radial artery, dorsalis pedis and  posterior tibial pulses are full and equal. No bruits present. Neurologic: Alert and oriented x3. Deep tendon reflexes symmetrical and normal.  Gait normal      Skin: Intact without suspicious lesions or rashes. Lymph: No  cervical, axillary lymphadenopathy present. Psych: Mood and  affect are normal. Normally interactive                                                                                     Assessment & Plan:  See Current Assessment & Plan in Problem List under specific DiagnosisThe labs will be reviewed and risks and options assessed. Written recommendations will be provided by mail or directly through My Chart.Further evaluation or change in medical therapy will be directed by those results.

## 2014-10-09 NOTE — Assessment & Plan Note (Signed)
Lipids, LFTs, TSH ,CK 

## 2014-10-09 NOTE — Assessment & Plan Note (Addendum)
CBC & dif  Anti reflux measures GI referral

## 2014-10-10 ENCOUNTER — Encounter: Payer: Medicare Other | Admitting: Internal Medicine

## 2014-10-30 ENCOUNTER — Encounter: Payer: Self-pay | Admitting: Gastroenterology

## 2014-12-05 ENCOUNTER — Telehealth: Payer: Self-pay | Admitting: Internal Medicine

## 2014-12-05 DIAGNOSIS — I10 Essential (primary) hypertension: Secondary | ICD-10-CM

## 2014-12-05 DIAGNOSIS — E559 Vitamin D deficiency, unspecified: Secondary | ICD-10-CM

## 2014-12-05 DIAGNOSIS — E785 Hyperlipidemia, unspecified: Secondary | ICD-10-CM

## 2014-12-05 DIAGNOSIS — K219 Gastro-esophageal reflux disease without esophagitis: Secondary | ICD-10-CM

## 2014-12-05 NOTE — Telephone Encounter (Signed)
Please re-enter lab orders from 10/09/14. Patient will come in the next few weeks to have these drawn.

## 2014-12-05 NOTE — Telephone Encounter (Signed)
Re-entered labs...Madeline [name]

## 2015-01-06 ENCOUNTER — Ambulatory Visit: Payer: Medicare Other | Admitting: Internal Medicine

## 2015-03-31 ENCOUNTER — Telehealth: Payer: Self-pay | Admitting: Internal Medicine

## 2015-03-31 NOTE — Telephone Encounter (Signed)
Called patient to see if a recent mammogram has been done, pt states that she will get results to [place] after visiting her OB/GYN.

## 2015-06-09 ENCOUNTER — Ambulatory Visit (INDEPENDENT_AMBULATORY_CARE_PROVIDER_SITE_OTHER): Payer: Medicare Other | Admitting: Family

## 2015-06-09 ENCOUNTER — Encounter: Payer: Self-pay | Admitting: Family

## 2015-06-09 ENCOUNTER — Telehealth: Payer: Self-pay | Admitting: Internal Medicine

## 2015-06-09 VITALS — BP 126/84 | HR 86 | Temp 98.1°F | Resp 16 | Ht 67.5 in | Wt 190.6 lb

## 2015-06-09 DIAGNOSIS — R310 Gross hematuria: Secondary | ICD-10-CM

## 2015-06-09 DIAGNOSIS — N390 Urinary tract infection, site not specified: Secondary | ICD-10-CM | POA: Diagnosis not present

## 2015-06-09 LAB — POCT URINALYSIS DIPSTICK
Bilirubin, UA: NEGATIVE
GLUCOSE UA: NEGATIVE
Ketones, UA: NEGATIVE
NITRITE UA: NEGATIVE
Spec Grav, UA: >=1.03
UROBILINOGEN UA: NEGATIVE
pH, UA: 5.5

## 2015-06-09 MED ORDER — CIPROFLOXACIN HCL 250 MG PO TABS: 250.0000 mg | ORAL_TABLET | Freq: Two times a day (BID) | ORAL | Status: DC

## 2015-06-09 NOTE — Telephone Encounter (Signed)
Patient Name: Madeline [name]  DOB: Jan 18, 1946    Initial Comment Caller states having sx of a UTI; started about 9. Also having bloody and brown discharge; is having more discomfort since this morning   Nurse Assessment  Nurse: Julien Girt, RN, Almyra Free Date/Time Eilene Ghazi Time): 06/09/2015 11:55:15 AM  Confirm and document reason for call. If symptomatic, describe symptoms. ---Caller states she is having sx of a UTI, she has frequency, urgency and pain. Adds she has scheduled an apt at 1:45 pm with Melissa at [facility], very frustrated with the phone problem at [facility] she was on hold for almost 12 mins, then it went in to a "ring tone" . When she wiped she sees blood, and the toilet water does turn pink.  Has the patient traveled out of the country within the last 30 days? ---Not Applicable  Does the patient require triage? ---Yes  Related visit to physician within the last 2 weeks? ---No  Does the PT have any chronic conditions? (i.e. diabetes, asthma, etc.) ---Yes  List chronic conditions. ---Hx UTI, Htn     Guidelines    Guideline Title Affirmed Question Affirmed Notes  Urination Pain - Female [1] SEVERE pain with urination (e.g., excruciating) AND [2] not improved after 2 hours of pain medicine and Sitz bath    Final Disposition User   See Physician within 4 Hours (or PCP triage) Julien Girt, RN, [facility]myra Free    Comments  A gentleman answered the phone and states this is the wrong number.  To [facility]   Referrals  GO TO FACILITY OTHER - SPECIFY  GO TO FACILITY OTHER - SPECIFY   Disagree/Comply: Comply

## 2015-06-09 NOTE — Telephone Encounter (Signed)
Appointment noted for today (06/09/15) at 1:45 pm with Debbrah Alar.  Message routed to provider and CMA.

## 2015-06-09 NOTE — Progress Notes (Signed)
Subjective:    Patient ID: Madeline [name], female    DOB: 06-08-1946, 69 y.o.   MRN: [id number]  HPI  Madeline [name] is a 69 yr old female who presents today with chief complaint of hematuria this AM.   She reports associated dysuria, pelvic discomfort. She has hx of bladder sling.  Reports that urine was pink at 9 AM,  And by 10:30 AM urine was red. She reports + frequency.  Reports previous hx of occasional UTI's.  She denies fever but describes an episode of "shuddering" which sounds like chills.    Review of Systems See HPI  Past Medical History  Diagnosis Date  . Hyperlipidemia   . Cystitis 2007    sepsis post bladder biopsy, Dr Jeffie Pollock  . Headache(784.0) 09-13-11    less frequent to none at present  . TMJ click 15-05-69    right side > left  . Cataracts, both eyes 09-13-11    not surgical yet  . Hypertension 09-13-11    tx. meds  . 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 GERD (gastroesophageal reflux disease) 09-14-11    tx. Nexium  . Fibromyalgia 09-14-11    Dr Donney Dice, [facility]  . Arthritis 09-14-11    osteoarthritis, osteopenia  . Strep throat 09-14-11    none in a yr.  . Fractures 09-14-11    toes-left foot  . Edema 09-14-11    occ. ankles/ feet > left leg  . Heart murmur 09-13-11  . Anxiety   . History of knee replacement 09/21/2011    left  . 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 H/O foot surgery 12/19/2011    Social History   Social History  . Marital Status: Married    Spouse Name: N/A  . Number of Children: N/A  . Years of Education: N/A   Occupational History  . Not on file.   Social History Main Topics  . Smoking status: Never Smoker   . Smokeless tobacco: Never Used  . Alcohol Use: Yes     Comment: rarely  . Drug Use: No  . Sexual Activity: No   Other Topics Concern  . Not on file   Social History Narrative   No reg exercise    Past Surgical History    Procedure Laterality Date  . Tonsillectomy    . Sigmoidoscopy    . Colonoscopy  2011    negative  . Sling procedure  2006    a and p repair  . Nasal sinus surgery  09-14-11    right side " sinus lift"  . Total knee arthroplasty  09/21/2011    Procedure: TOTAL KNEE ARTHROPLASTY;  Surgeon: Gearlean Alf;  [facility]: [facility];  Service: Orthopedics;  Laterality: Left;  . Foot arthrodesis  01/03/12    [facility]-- left hallux MP joint arthrodesis  . Hammer toe surgery  01/03/12    [facility], Dr Doran Durand  . Abdominal hysterectomy  1977    dysfunctional menses; age 62  . Appendectomy  1963  . Cystostomy w/ bladder biopsy  04-2006  . Total knee arthroplasty  06/25/2012    Procedure: TOTAL KNEE ARTHROPLASTY;  Surgeon: Gearlean Alf, MD;  Location[facility];  Service: Orthopedics;  Laterality: Right;    Family History  Problem Relation Age of Onset  . Mental illness Brother     paranoid schizophrenia  . Heart attack  both GF;PGM; M uncle  . Stroke Paternal Aunt     aneurysm  . Colon cancer Paternal Aunt     X 2  . Other Mother     lung tumor which resolved w/o treatment  . Hypertension Mother   . Aortic aneurysm Father     femoral aneurysm  . Diabetes Neg Hx   . Other Maternal Grandmother     enalraged heart  . Heart attack Maternal Grandfather     In fifties, died.  Marland Kitchen Heart attack Paternal Grandmother     In forties.  Marland Kitchen Heart attack Paternal Grandfather     Allergies  Allergen Reactions  . Ivp Dye [Iodinated Diagnostic Agents]     Rash , hives  . Codeine Itching    Unless something given to counteract itching    Current Outpatient Prescriptions on File Prior to Visit  Medication Sig Dispense Refill  . CALCIUM PO Take by mouth daily.    . Cholecalciferol (VITAMIN D3) 2000 UNITS TABS Take by mouth. Take 4000 units Daily and 6000 on Sunday.    . cyclobenzaprine (FLEXERIL) 10 MG tablet Take 20 mg by mouth at bedtime.    Marland Kitchen diltiazem (DILACOR XR) 180 MG 24 hr capsule  Take 1 capsule (180 mg total) by mouth at bedtime. 90 capsule 3  . DULoxetine (CYMBALTA) 60 MG capsule Take 60 mg by mouth daily.    . fosinopril (MONOPRIL) 10 MG tablet Take 1 tablet (10 mg total) by mouth at bedtime. 90 tablet 3  . meloxicam (MOBIC) 7.5 MG tablet Take 7.5 mg by mouth daily. Every morning    . Multiple Vitamin (MULTI-DAY PO) Take by mouth daily.    . pravastatin (PRAVACHOL) 40 MG tablet Take 1 tablet (40 mg total) by mouth at bedtime. 90 tablet 3  . pregabalin (LYRICA) 75 MG capsule Take 75 mg by mouth at bedtime. Pt wants no generic    . tizanidine (ZANAFLEX) 2 MG capsule Take 4 mg by mouth at bedtime.     No current facility-administered medications on file prior to visit.    BP 126/84 mmHg  Pulse 86  Temp(Src) 98.1 F (36.7 C) (Oral)  Resp 16  Ht 5' 7.5" (1.715 m)  Wt 190 lb 9.6 oz (86.456 kg)  BMI 29.39 kg/m2  SpO2 97%       Objective:   Physical Exam  Constitutional: She is oriented to person, place, and time. She appears well-developed and well-nourished. No distress.  HENT:  Head: Normocephalic and atraumatic.  Cardiovascular: Regular rhythm.   No murmur heard. Pulmonary/Chest: Effort normal and breath sounds normal. No respiratory distress. She has no wheezes. She has no rales. She exhibits no tenderness.  Abdominal: Soft. She exhibits no distension and no mass. There is no tenderness. There is no rebound and no guarding.  Genitourinary:  Neg CVAT bilaterally  Musculoskeletal: She exhibits no edema.  Neurological: She is alert and oriented to person, place, and time.  Psychiatric: She has a normal mood and affect. Her behavior is normal. Judgment and thought content normal.          Assessment & Plan:  UTI with hematuria- She is noted to have 3+ blood and 1+ leuks on UA.  Will rx with cipro x 3 days. Will send urine for culture.  Advised pt that I would like to have her repeat her UA in 3 weeks to ensure resolution of hematuria. If persistent  hematuria will need additional work up. Pt verbalizes understanding.

## 2015-06-09 NOTE — Patient Instructions (Addendum)
Start cipro for urinary tract infection. Call if symptoms worsen or do not improve. Follow up in 3 weeks for repeat urinalysis to make sure that the blood is no longer present in your urine. Please schedule a lab visit at the front desk.  Urinary Tract Infection Urinary tract infections (UTIs) can develop anywhere along your urinary tract. Your urinary tract is your body's drainage system for removing wastes and extra water. Your urinary tract includes two kidneys, two ureters, a bladder, and a urethra. Your kidneys are a pair of bean-shaped organs. Each kidney is about the size of your fist. They are located below your ribs, one on each side of your spine. CAUSES Infections are caused by microbes, which are microscopic organisms, including fungi, viruses, and bacteria. These organisms are so small that they can only be seen through a microscope. Bacteria are the microbes that most commonly cause UTIs. SYMPTOMS  Symptoms of UTIs may vary by age and gender of the patient and by the location of the infection. Symptoms in young women typically include a frequent and intense urge to urinate and a painful, burning feeling in the bladder or urethra during urination. Older women and men are more likely to be tired, shaky, and weak and have muscle aches and abdominal pain. A fever may mean the infection is in your kidneys. Other symptoms of a kidney infection include pain in your back or sides below the ribs, nausea, and vomiting. DIAGNOSIS To diagnose a UTI, your caregiver will ask you about your symptoms. Your caregiver also will ask to provide a urine sample. The urine sample will be tested for bacteria and white blood cells. White blood cells are made by your body to help fight infection. TREATMENT  Typically, UTIs can be treated with medication. Because most UTIs are caused by a bacterial infection, they usually can be treated with the use of antibiotics. The choice of antibiotic and length of treatment  depend on your symptoms and the type of bacteria causing your infection. HOME CARE INSTRUCTIONS  If you were prescribed antibiotics, take them exactly as your caregiver instructs you. Finish the medication even if you feel better after you have only taken some of the medication.  Drink enough water and fluids to keep your urine clear or pale yellow.  Avoid caffeine, tea, and carbonated beverages. They tend to irritate your bladder.  Empty your bladder often. Avoid holding urine for long periods of time.  Empty your bladder before and after sexual intercourse.  After a bowel movement, women should cleanse from front to back. Use each tissue only once. SEEK MEDICAL CARE IF:   You have back pain.  You develop a fever.  Your symptoms do not begin to resolve within 3 days. SEEK IMMEDIATE MEDICAL CARE IF:   You have severe back pain or lower abdominal pain.  You develop chills.  You have nausea or vomiting.  You have continued burning or discomfort with urination. MAKE SURE YOU:   Understand these instructions.  Will watch your condition.  Will get help right away if you are not doing well or get worse. Document Released: 06/15/2005 Document Revised: 03/06/2012 Document Reviewed: 10/14/2011 [facility] Patient Information 2015 [place], [place]. This information is not intended to replace advice given to you by your health care provider. Make sure you discuss any questions you have with your health care provider.

## 2015-06-09 NOTE — Progress Notes (Signed)
Pre visit review using our clinic review tool, if applicable. No additional management support is needed unless otherwise documented below in the visit note. 

## 2015-06-10 ENCOUNTER — Telehealth: Payer: Self-pay | Admitting: *Deleted

## 2015-06-10 NOTE — Telephone Encounter (Signed)
Received fax from [employer] that pt is taking tizanidine and there could be drug/drug interaction with Cipro. Could increase the effect of tizanidine / dizziness. Pharmacist spoke with pt and pt will stop Tizanidine while on Cipro.

## 2015-06-11 ENCOUNTER — Encounter: Payer: Self-pay | Admitting: Family

## 2015-06-11 NOTE — Telephone Encounter (Signed)
Noted and agree. 

## 2015-06-12 LAB — URINE CULTURE

## 2015-06-25 ENCOUNTER — Ambulatory Visit: Payer: Medicare Other | Attending: Internal Medicine | Admitting: Physical Therapy

## 2015-06-25 ENCOUNTER — Encounter: Payer: Self-pay | Admitting: Physical Therapy

## 2015-06-25 DIAGNOSIS — M25612 Stiffness of left shoulder, not elsewhere classified: Secondary | ICD-10-CM | POA: Diagnosis present

## 2015-06-25 DIAGNOSIS — M25512 Pain in left shoulder: Secondary | ICD-10-CM

## 2015-06-25 DIAGNOSIS — M542 Cervicalgia: Secondary | ICD-10-CM

## 2015-06-25 NOTE — Therapy (Signed)
[facility] [address] [place], [place], [address] Phone: [phone]   Fax:  [phone]  Physical Therapy Evaluation  Patient Details  Name: Madeline [name] MRN: [id number] Date of Birth: 26-Mar-1946 Referring Provider:  Concepcion Living, MD  Encounter Date: 06/25/2015      PT End of Session - 06/25/15 1720    Visit Number 1   Date for PT Re-Evaluation 08/25/15   PT Start Time 1629   PT Stop Time 1731   PT Time Calculation (min) 62 min   Activity Tolerance Patient tolerated treatment well   Behavior During Therapy [facility] for tasks assessed/performed      Past Medical History  Diagnosis Date  . Hyperlipidemia   . Cystitis 2007    sepsis post bladder biopsy, Dr Annabell Howells  . Headache(784.0) 09-13-11    less frequent to none at present  . TMJ click 09-13-11    right side > left  . Cataracts, both eyes 09-13-11    not surgical yet  . Hypertension 09-13-11    tx. meds  . 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 GERD (gastroesophageal reflux disease) 09-14-11    tx. Nexium  . Fibromyalgia 09-14-11    Dr Dian Queen, [facility]  . Arthritis 09-14-11    osteoarthritis, osteopenia  . Strep throat 09-14-11    none in a yr.  . Fractures 09-14-11    toes-left foot  . Edema 09-14-11    occ. ankles/ feet > left leg  . Heart murmur 09-13-11  . Anxiety   . History of knee replacement 09/21/2011    left  . 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 H/O foot surgery 12/19/2011    Past Surgical History  Procedure Laterality Date  . Tonsillectomy    . Sigmoidoscopy    . Colonoscopy  2011    negative  . Sling procedure  2006    a and p repair  . Nasal sinus surgery  09-14-11    right side " sinus lift"  . Total knee arthroplasty  09/21/2011    Procedure: TOTAL KNEE ARTHROPLASTY;  Surgeon: Loanne Drilling;  [facility];  Service: Orthopedics;   Laterality: Left;  . Foot arthrodesis  01/03/12    [facility]-- left hallux MP joint arthrodesis  . Hammer toe surgery  01/03/12    [facility], Dr Victorino Dike  . Abdominal hysterectomy  1977    dysfunctional menses; age 69  . Appendectomy  1963  . Cystostomy w/ bladder biopsy  04-2006  . Total knee arthroplasty  06/25/2012    Procedure: TOTAL KNEE ARTHROPLASTY;  Surgeon: Loanne Drilling, MD;  [facility];  Service: Orthopedics;  Laterality: Right;    There were no vitals filed for this visit.  Visit Diagnosis:  Left shoulder pain - Plan: PT plan of care cert/re-cert  Neck pain - Plan: PT plan of care cert/re-cert  Shoulder stiffness, left - Plan: PT plan of care cert/re-cert      Subjective Assessment - 06/25/15 1631    Subjective Patient presents today with c/o left shoulder pain, she reports that she started having left shoulder and trapezius pain in the lst two months, she is not sure of any exercise that was added, but reports that they may have gone up in weight.   Limitations Lifting;House hold activities   Currently in Pain? Yes  Pain Score 4    Pain Location Shoulder   Pain Orientation Left   Pain Descriptors / Indicators Aching;Spasm   Pain Type Chronic pain   Pain Radiating Towards reports some numbness in the 4th and 5th fingers and some pain in the right thumb   Pain Onset More than a month ago   Pain Frequency Constant   Aggravating Factors  worse with any activity especially reaching up and out, pain up to 9/10   Pain Relieving Factors rest  and heat can decrease pain to 2/10   Effect of Pain on Daily Activities limits ADL's            [facility] PT Assessment - 06/25/15 0001    Assessment   Medical Diagnosis left shoulder pain, fibromyalgia   Onset Date/Surgical Date 06/04/15   Hand Dominance Right   Prior Therapy no   Precautions   Precautions None   Balance Screen   Has the patient had a decrease in activity level because of a fear of falling?  No    Is the patient reluctant to leave their home because of a fear of falling?  No   Home Environment   Additional Comments does her own housework and some gardening   Prior Function   Level of Independence Independent   Leisure works out at gym with trainer 3x/ week   Posture/Postural Control   Posture Comments some rounded shoulders   AROM   Overall AROM Comments cervical ROM is decreased 50% with stiffness and itghtness,   Left Shoulder Flexion 120 Degrees   Left Shoulder ABduction 110 Degrees   Left Shoulder Internal Rotation 10 Degrees   Left Shoulder External Rotation 64 Degrees   Strength   Overall Strength Comments 4-/5 with pain for all left shoulder motions   Flexibility   Soft Tissue Assessment /Muscle Length --  + neural tension Left > right   Palpation   Palpation comment very tight and tender in the left upper trap, left deltoid and cervical parapsinals as well as down into the rhomboids   Special Tests    Special Tests --  negative impingement                   [facility] Adult PT Treatment/Exercise - 06/25/15 0001    Modalities   Modalities Electrical Stimulation;Moist Heat   Moist Heat Therapy   Number Minutes Moist Heat 15 Minutes   Moist Heat Location Cervical   Electrical Stimulation   Electrical Stimulation Location left upper trap   Electrical Stimulation Action IFC   Electrical Stimulation Parameters tolerance   Electrical Stimulation Goals Pain                  PT Short Term Goals - 06/25/15 1723    PT SHORT TERM GOAL #1   Title independent with initial HEP   Time 2   Period Weeks   Status New           PT Long Term Goals - 06/25/15 1723    PT LONG TERM GOAL #1   Title understand proper posture and body mechanics   Time 8   Period Weeks   Status New   PT LONG TERM GOAL #2   Title decrease pain 50%   Time 8   Period Weeks   Status New   PT LONG TERM GOAL #3   Title increase left shoulder AROM to 150 degrees flexion    Time 8   Period Weeks  Status New   PT LONG TERM GOAL #4   Title increase left shoulder AROM of IR to 65 degrees   Time 8   Period Weeks   Status New   PT LONG TERM GOAL #5   Title dress and do hair without difficulty   Time 8   Period Weeks   Status New               Plan - June 30, 2015 1720    Clinical Impression Statement Patient with left shoulder pain for about 2 months, not sure of a cause, she does exercises at gym. She has large knot in the left upper trap, very tight in the trap, cervical paraspinals and into the rhomboids.  Has some limited shoulder ROM and cervical ROM is decreased 50%    Pt will benefit from skilled therapeutic intervention in order to improve on the following deficits Decreased range of motion;Decreased strength;Increased muscle spasms;Impaired flexibility;Impaired UE functional use;Pain   Rehab Potential Good   PT Frequency 2x / week   PT Duration 8 weeks   PT Treatment/Interventions Moist Heat;Iontophoresis 4mg /ml Dexamethasone;Electrical Stimulation;Cryotherapy;Ultrasound;Therapeutic exercise;Manual techniques;Taping;Patient/family education;Passive range of motion   PT Next Visit Plan may add some exercises/stretches, [place] and STM   Consulted and Agree with Plan of Care Patient          G-Codes - 30-Jun-2015 1725    Functional Assessment Tool Used foto   Functional Limitation Other PT primary   Other PT Primary Current Status (B8675) At least 60 percent but less than 80 percent impaired, limited or restricted   Other PT Primary Goal Status (Q4920) At least 40 percent but less than 60 percent impaired, limited or restricted       Problem List Patient Active Problem List   Diagnosis Date Noted  . OA (osteoarthritis) of knee 06/25/2012  . 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 Osteopenia 03/05/2008  . Hyperlipidemia 12/21/2007  . Essential hypertension 12/21/2007  . GERD 12/21/2007  . CONSTIPATION, CHRONIC 12/21/2007   . FIBROMYALGIA 12/21/2007  . Memory loss 12/21/2007    Sumner Boast., PT 06/30/2015, 5:31 PM  [address] [place], [place], [address] Phone: [phone]   Fax:  [phone]

## 2015-06-30 ENCOUNTER — Ambulatory Visit: Payer: Medicare Other | Admitting: Physical Therapy

## 2015-06-30 ENCOUNTER — Encounter: Payer: Self-pay | Admitting: Physical Therapy

## 2015-06-30 ENCOUNTER — Other Ambulatory Visit (INDEPENDENT_AMBULATORY_CARE_PROVIDER_SITE_OTHER): Payer: Medicare Other

## 2015-06-30 DIAGNOSIS — R310 Gross hematuria: Secondary | ICD-10-CM | POA: Diagnosis not present

## 2015-06-30 DIAGNOSIS — M25512 Pain in left shoulder: Secondary | ICD-10-CM | POA: Diagnosis not present

## 2015-06-30 DIAGNOSIS — M542 Cervicalgia: Secondary | ICD-10-CM

## 2015-06-30 DIAGNOSIS — M25612 Stiffness of left shoulder, not elsewhere classified: Secondary | ICD-10-CM

## 2015-06-30 LAB — URINALYSIS, ROUTINE W REFLEX MICROSCOPIC
Bilirubin Urine: NEGATIVE
Ketones, ur: NEGATIVE
Leukocytes, UA: NEGATIVE
Nitrite: NEGATIVE
PH: 5.5 (ref 5.0–8.0)
Specific Gravity, Urine: >=1.03 — AB (ref 1.000–1.030)
TOTAL PROTEIN, URINE-UPE24: NEGATIVE
Urine Glucose: NEGATIVE
Urobilinogen, UA: 0.2 (ref 0.0–1.0)

## 2015-06-30 NOTE — Therapy (Signed)
[facility] [address] [place], [place], [address] Phone: [phone]   Fax:  [phone]  Physical Therapy Treatment  Patient Details  Name: Madeline [name] MRN: [id number] Date of Birth: 06/06/1946 Referring Provider:  Lyna Poser, MD  Encounter Date: 06/30/2015      PT End of Session - 06/30/15 1618    Visit Number 2   Date for PT Re-Evaluation 08/25/15   PT Start Time 5697   PT Stop Time 1630   PT Time Calculation (min) 57 min   Activity Tolerance Patient tolerated treatment well   Behavior During Therapy [facility] for tasks assessed/performed      Past Medical History  Diagnosis Date  . Hyperlipidemia   . Cystitis 2007    sepsis post bladder biopsy, Dr Jeffie Pollock  . Headache(784.0) 09-13-11    less frequent to none at present  . TMJ click [id number]    right side > left  . Cataracts, both eyes 09-13-11    not surgical yet  . Hypertension 09-13-11    tx. meds  . 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 GERD (gastroesophageal reflux disease) 09-14-11    tx. Nexium  . Fibromyalgia 09-14-11    Dr Donney Dice, [facility]  . Arthritis 09-14-11    osteoarthritis, osteopenia  . Strep throat 09-14-11    none in a yr.  . Fractures 09-14-11    toes-left foot  . Edema 09-14-11    occ. ankles/ feet > left leg  . Heart murmur 09-13-11  . Anxiety   . History of knee replacement 09/21/2011    left  . 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 H/O foot surgery 12/19/2011    Past Surgical History  Procedure Laterality Date  . Tonsillectomy    . Sigmoidoscopy    . Colonoscopy  2011    negative  . Sling procedure  2006    a and p repair  . Nasal sinus surgery  09-14-11    right side " sinus lift"  . Total knee arthroplasty  09/21/2011    Procedure: TOTAL KNEE ARTHROPLASTY;  Surgeon: Gearlean Alf;  [facility];  Service: Orthopedics;   Laterality: Left;  . Foot arthrodesis  01/03/12    [facility]-- left hallux MP joint arthrodesis  . Hammer toe surgery  01/03/12    [facility], Dr Doran Durand  . Abdominal hysterectomy  1977    dysfunctional menses; age 69  . Appendectomy  1963  . Cystostomy w/ bladder biopsy  04-2006  . Total knee arthroplasty  06/25/2012    Procedure: TOTAL KNEE ARTHROPLASTY;  Surgeon: Gearlean Alf, MD;  [facility];  Service: Orthopedics;  Laterality: Right;    There were no vitals filed for this visit.  Visit Diagnosis:  Left shoulder pain  Neck pain  Shoulder stiffness, left      Subjective Assessment - 06/30/15 1534    Subjective I was pretty sore in those mms.  I think this is going to help   Currently in Pain? Yes   Pain Score 4    Pain Location Shoulder   Pain Orientation Left   Pain Descriptors / Indicators Tightness                         [facility] Adult PT Treatment/Exercise - 06/30/15 0001  Modalities   Modalities Ultrasound   Moist Heat Therapy   Number Minutes Moist Heat 15 Minutes   Moist Heat Location Cervical   Electrical Stimulation   Electrical Stimulation Location left upper trap   Electrical Stimulation Action IFC   Electrical Stimulation Parameters tolerance   Electrical Stimulation Goals Pain   Ultrasound   Ultrasound Location to left upper trap, into the neck and rhomboid area   Ultrasound Parameters 100% 1 MHzm1.5 w/cm2   Ultrasound Goals Pain   Manual Therapy   Manual Therapy Soft tissue mobilization;Neural Stretch   Manual therapy comments occipital release   Soft tissue mobilization to left upper trap, rhomboid and c-p-spinals   Neural Stretch left UE                  PT Short Term Goals - 06/25/15 1723    PT SHORT TERM GOAL #1   Title independent with initial HEP   Time 2   Period Weeks   Status New           PT Long Term Goals - 06/25/15 1723    PT LONG TERM GOAL #1   Title understand proper posture and  body mechanics   Time 8   Period Weeks   Status New   PT LONG TERM GOAL #2   Title decrease pain 50%   Time 8   Period Weeks   Status New   PT LONG TERM GOAL #3   Title increase left shoulder AROM to 150 degrees flexion   Time 8   Period Weeks   Status New   PT LONG TERM GOAL #4   Title increase left shoulder AROM of IR to 65 degrees   Time 8   Period Weeks   Status New   PT LONG TERM GOAL #5   Title dress and do hair without difficulty   Time 8   Period Weeks   Status New               Plan - 06/30/15 1645    Clinical Impression Statement Still with significant trigger points in the left upper trap and rhomboids.  Tight neural tension in the left UE   PT Next Visit Plan slowly add exercises   Consulted and Agree with Plan of Care Patient        Problem List Patient Active Problem List   Diagnosis Date Noted  . OA (osteoarthritis) of knee 06/25/2012  . 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 Osteopenia 03/05/2008  . Hyperlipidemia 12/21/2007  . Essential hypertension 12/21/2007  . GERD 12/21/2007  . CONSTIPATION, CHRONIC 12/21/2007  . FIBROMYALGIA 12/21/2007  . Memory loss 12/21/2007    Sumner Boast., PT 06/30/2015, 4:48 PM  [address] [place], [place], [address] Phone: [phone]   Fax:  [phone]

## 2015-07-01 ENCOUNTER — Telehealth: Payer: Self-pay | Admitting: Family

## 2015-07-01 DIAGNOSIS — R319 Hematuria, unspecified: Secondary | ICD-10-CM

## 2015-07-01 NOTE — Telephone Encounter (Signed)
Follow up UA shows persistent blood in urine.  I would recommend that she have a CT scan to evaluate her kidneys. Pt will need bmet first, pended below please.

## 2015-07-02 ENCOUNTER — Telehealth: Payer: Self-pay | Admitting: Internal Medicine

## 2015-07-02 DIAGNOSIS — R319 Hematuria, unspecified: Secondary | ICD-10-CM

## 2015-07-02 NOTE — Telephone Encounter (Signed)
Pt notified and made aware.  She stated understanding and is in agreement with plan.  Lab appointment scheduled.  CT Scan and BMET ordered.

## 2015-07-02 NOTE — Addendum Note (Signed)
Addended by: Debbrah Alar on: 07/02/2015 05:03 PM   Modules accepted: Orders

## 2015-07-02 NOTE — Telephone Encounter (Signed)
Imaging has not scheduled this procedure with the patient.  They called to state that if the patient is having any pain then the study needs to be entered as bd and pelvis w and without contrast.  They want you to change the order

## 2015-07-03 ENCOUNTER — Telehealth: Payer: Self-pay | Admitting: *Deleted

## 2015-07-03 DIAGNOSIS — R319 Hematuria, unspecified: Secondary | ICD-10-CM

## 2015-07-03 NOTE — Telephone Encounter (Signed)
Received call from [facility] in radiology re: pt's CT abd/pelvis that is scheduled for 07/09/15. Pt has history of rash/hives to IV dye and they are requesting pre-medication for pt. Please advise?

## 2015-07-04 NOTE — Telephone Encounter (Signed)
I would like to refer her to urology and let them decide the safest testing for her given her hx of contrast allergy.  Cancel CT.

## 2015-07-06 ENCOUNTER — Telehealth: Payer: Self-pay

## 2015-07-06 NOTE — Telephone Encounter (Signed)
Notified pt. She states she is willing to proceed with referral but doesn't want to see Dr Jeffie Pollock that she has seen in the past. Left message on voicemail in radiology and sent staff message to [facility] in radiology to cancel pt's CT.

## 2015-07-06 NOTE — Telephone Encounter (Signed)
error 

## 2015-07-06 NOTE — Telephone Encounter (Signed)
Spoke with Madeline [name] in radiology to cancel order.

## 2015-07-07 ENCOUNTER — Other Ambulatory Visit: Payer: Medicare Other

## 2015-07-07 ENCOUNTER — Encounter: Payer: Self-pay | Admitting: Physical Therapy

## 2015-07-07 ENCOUNTER — Ambulatory Visit: Payer: Medicare Other | Admitting: Physical Therapy

## 2015-07-07 DIAGNOSIS — M25512 Pain in left shoulder: Secondary | ICD-10-CM | POA: Diagnosis not present

## 2015-07-07 DIAGNOSIS — M542 Cervicalgia: Secondary | ICD-10-CM

## 2015-07-07 DIAGNOSIS — M25612 Stiffness of left shoulder, not elsewhere classified: Secondary | ICD-10-CM

## 2015-07-07 NOTE — Therapy (Signed)
[place] [address] [place], [place], [address] Phone: [phone]   Fax:  [phone]  Physical Therapy Treatment  Patient Details  Name: Madeline [name] MRN: [id number] Date of Birth: 1945-10-28 No Data Recorded  Encounter Date: 07/07/2015      PT End of Session - 07/07/15 1442    Visit Number 3   Date for PT Re-Evaluation 08/25/15   PT Start Time 1407   PT Stop Time 1500   PT Time Calculation (min) 53 min   Activity Tolerance Patient tolerated treatment well   Behavior During Therapy [facility] for tasks assessed/performed      Past Medical History  Diagnosis Date  . Hyperlipidemia   . Cystitis 2007    sepsis post bladder biopsy, Dr Jeffie Pollock  . Headache(784.0) 09-13-11    less frequent to none at present  . TMJ click 71-06-26    right side > left  . Cataracts, both eyes 09-13-11    not surgical yet  . Hypertension 09-13-11    tx. meds  . 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 GERD (gastroesophageal reflux disease) 09-14-11    tx. Nexium  . Fibromyalgia 09-14-11    Dr Donney Dice, [facility]  . Arthritis 09-14-11    osteoarthritis, osteopenia  . Strep throat 09-14-11    none in a yr.  . Fractures 09-14-11    toes-left foot  . Edema 09-14-11    occ. ankles/ feet > left leg  . Heart murmur 09-13-11  . Anxiety   . History of knee replacement 09/21/2011    left  . 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 H/O foot surgery 12/19/2011    Past Surgical History  Procedure Laterality Date  . Tonsillectomy    . Sigmoidoscopy    . Colonoscopy  2011    negative  . Sling procedure  2006    a and p repair  . Nasal sinus surgery  09-14-11    right side " sinus lift"  . Total knee arthroplasty  09/21/2011    Procedure: TOTAL KNEE ARTHROPLASTY;  Surgeon: Gearlean Alf;  Location: [facility];  Service: Orthopedics;  Laterality: Left;  . Foot  arthrodesis  01/03/12    [facility]-- left hallux MP joint arthrodesis  . Hammer toe surgery  01/03/12    [facility], Dr Doran Durand  . Abdominal hysterectomy  1977    dysfunctional menses; age 66  . Appendectomy  1963  . Cystostomy w/ bladder biopsy  04-2006  . Total knee arthroplasty  06/25/2012    Procedure: TOTAL KNEE ARTHROPLASTY;  Surgeon: Gearlean Alf, MD;  [facility];  Service: Orthopedics;  Laterality: Right;    There were no vitals filed for this visit.  Visit Diagnosis:  Left shoulder pain  Neck pain  Shoulder stiffness, left      Subjective Assessment - 07/07/15 1409    Subjective I am sore, but I think I am getting better.   Currently in Pain? Yes   Pain Score 3    Pain Location Shoulder   Pain Orientation Left   Aggravating Factors  reaching out and up                         [facility] Adult PT Treatment/Exercise - 07/07/15 0001    Moist Heat Therapy   Number  Minutes Moist Heat 15 Minutes   Moist Heat Location Cervical   Electrical Stimulation   Electrical Stimulation Location left upper trap   Electrical Stimulation Action IFC   Electrical Stimulation Parameters tolerance   Electrical Stimulation Goals Pain   Ultrasound   Ultrasound Location to left upper trap   Ultrasound Parameters 100% 1MHz, 1.5 w/cm2   Ultrasound Goals Pain   Manual Therapy   Manual Therapy Soft tissue mobilization;Neural Stretch   Manual therapy comments occipital release   Soft tissue mobilization to left upper trap, rhomboid and c-p-spinals   Neural Stretch left UE                PT Education - 07/07/15 1441    Education provided Yes   Education Details gave exercises for scapular stabilization and some ER exercises   Person(s) Educated Patient   Methods Explanation;Demonstration;Handout   Comprehension Verbalized understanding          PT Short Term Goals - 07/07/15 1444    PT SHORT TERM GOAL #1   Title independent with initial HEP    Status Achieved           PT Long Term Goals - 06/25/15 1723    PT LONG TERM GOAL #1   Title understand proper posture and body mechanics   Time 8   Period Weeks   Status New   PT LONG TERM GOAL #2   Title decrease pain 50%   Time 8   Period Weeks   Status New   PT LONG TERM GOAL #3   Title increase left shoulder AROM to 150 degrees flexion   Time 8   Period Weeks   Status New   PT LONG TERM GOAL #4   Title increase left shoulder AROM of IR to 65 degrees   Time 8   Period Weeks   Status New   PT LONG TERM GOAL #5   Title dress and do hair without difficulty   Time 8   Period Weeks   Status New               Plan - 07/07/15 1443    Clinical Impression Statement Trigger point is less tender, still prominent.  Added some UE exercises today   PT Next Visit Plan slowly add exercises and see how she responds   Consulted and Agree with Plan of Care Patient        Problem List Patient Active Problem List   Diagnosis Date Noted  . OA (osteoarthritis) of knee 06/25/2012  . 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 Osteopenia 03/05/2008  . Hyperlipidemia 12/21/2007  . Essential hypertension 12/21/2007  . GERD 12/21/2007  . CONSTIPATION, CHRONIC 12/21/2007  . FIBROMYALGIA 12/21/2007  . Memory loss 12/21/2007    Sumner Boast., PT 07/07/2015, 2:45 PM  [place] [place], [place], [address] Phone: [phone]   Fax:  [phone]  Name: Madeline [name] MRN: [id number] Date of Birth: Sep 21, 1945

## 2015-07-08 ENCOUNTER — Encounter: Payer: Self-pay | Admitting: Internal Medicine

## 2015-07-09 ENCOUNTER — Ambulatory Visit: Payer: Medicare Other | Admitting: Physical Therapy

## 2015-07-09 ENCOUNTER — Encounter: Payer: Self-pay | Admitting: Physical Therapy

## 2015-07-09 ENCOUNTER — Other Ambulatory Visit (HOSPITAL_BASED_OUTPATIENT_CLINIC_OR_DEPARTMENT_OTHER): Payer: Medicare Other

## 2015-07-09 DIAGNOSIS — M25512 Pain in left shoulder: Secondary | ICD-10-CM | POA: Diagnosis not present

## 2015-07-09 DIAGNOSIS — M542 Cervicalgia: Secondary | ICD-10-CM

## 2015-07-09 DIAGNOSIS — M25612 Stiffness of left shoulder, not elsewhere classified: Secondary | ICD-10-CM

## 2015-07-09 NOTE — Therapy (Signed)
[place], [place], [address] Phone: [phone]   Fax:  [phone]  Physical Therapy Treatment  Patient Details  Name: Madeline [name] MRN: [id number] Date of Birth: 1946/02/12 No Data Recorded  Encounter Date: 07/09/2015      PT End of Session - 07/09/15 1518    Visit Number 4   Date for PT Re-Evaluation 08/25/15   PT Start Time 1357   PT Stop Time 1446   PT Time Calculation (min) 49 min   Activity Tolerance Patient tolerated treatment well   Behavior During Therapy [facility] for tasks assessed/performed      Past Medical History  Diagnosis Date  . Hyperlipidemia   . Cystitis 2007    sepsis post bladder biopsy, Dr Jeffie Pollock  . Headache(784.0) 09-13-11    less frequent to none at present  . TMJ click 25-42-70    right side > left  . Cataracts, both eyes 09-13-11    not surgical yet  . Hypertension 09-13-11    tx. meds  . 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 GERD (gastroesophageal reflux disease) 09-14-11    tx. Nexium  . Fibromyalgia 09-14-11    Dr Donney Dice, [facility]  . Arthritis 09-14-11    osteoarthritis, osteopenia  . Strep throat 09-14-11    none in a yr.  . Fractures 09-14-11    toes-left foot  . Edema 09-14-11    occ. ankles/ feet > left leg  . Heart murmur 09-13-11  . Anxiety   . History of knee replacement 09/21/2011    left  . 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 H/O foot surgery 12/19/2011    Past Surgical History  Procedure Laterality Date  . Tonsillectomy    . Sigmoidoscopy    . Colonoscopy  2011    negative  . Sling procedure  2006    a and p repair  . Nasal sinus surgery  09-14-11    right side " sinus lift"  . Total knee arthroplasty  09/21/2011    Procedure: TOTAL KNEE ARTHROPLASTY;  Surgeon: Gearlean Alf;  [facility];  Service: Orthopedics;  Laterality: Left;  . Foot  arthrodesis  01/03/12    [place] [facility]-- left hallux MP joint arthrodesis  . Hammer toe surgery  01/03/12    [facility], Dr Doran Durand  . Abdominal hysterectomy  1977    dysfunctional menses; age 43  . Appendectomy  1963  . Cystostomy w/ bladder biopsy  04-2006  . Total knee arthroplasty  06/25/2012    Procedure: TOTAL KNEE ARTHROPLASTY;  Surgeon: Gearlean Alf, MD;  [facility];  Service: Orthopedics;  Laterality: Right;    There were no vitals filed for this visit.  Visit Diagnosis:  Left shoulder pain  Neck pain  Shoulder stiffness, left      Subjective Assessment - 07/09/15 1401    Subjective I have had more pain in the left arm.  I did some exercise and I do not know if that is what did it, but I could not sleep the last two nights   Currently in Pain? Yes   Pain Score 5    Pain Location Shoulder   Pain Orientation Left   Pain Descriptors / Indicators Aching;Tightness   Pain Type Chronic pain  [facility] [facility] Adult PT Treatment/Exercise - 07/09/15 0001    Electrical Stimulation   Electrical Stimulation Location left upper trap   Electrical Stimulation Action IFC   Electrical Stimulation Parameters [place]   Electrical Stimulation Goals Pain   Manual Therapy   Manual Therapy Soft tissue mobilization;Neural Stretch   Manual therapy comments occipital release   Soft tissue mobilization to left upper trap, rhomboid and c-p-spinals   Neural Stretch left UE                  PT Short Term Goals - 07/07/15 1444    PT SHORT TERM GOAL #1   Title independent with initial HEP   Status Achieved           PT Long Term Goals - 07/09/15 1520    PT LONG TERM GOAL #1   Title understand proper posture and body mechanics   Status Partially Met               Plan - 07/09/15 1519    Clinical Impression Statement Much more issues in the neck and into the left arm today.  Very tight neural tension   PT Next Visit Plan  slowly add exercises and see how she responds   Consulted and Agree with Plan of Care Patient        Problem List Patient Active Problem List   Diagnosis Date Noted  . OA (osteoarthritis) of knee 06/25/2012  . 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 Osteopenia 03/05/2008  . Hyperlipidemia 12/21/2007  . Essential hypertension 12/21/2007  . GERD 12/21/2007  . CONSTIPATION, CHRONIC 12/21/2007  . FIBROMYALGIA 12/21/2007  . Memory loss 12/21/2007    Sumner Boast., PT 07/09/2015, 3:21 PM  [place] [name] [place], [place], [address] Phone: [phone]   Fax:  [phone]  Name: Madeline [name] MRN: [id number] Date of Birth: 1946/05/28

## 2015-07-15 ENCOUNTER — Ambulatory Visit: Payer: Medicare Other | Admitting: Physical Therapy

## 2015-07-15 ENCOUNTER — Encounter: Payer: Self-pay | Admitting: Physical Therapy

## 2015-07-15 DIAGNOSIS — M25612 Stiffness of left shoulder, not elsewhere classified: Secondary | ICD-10-CM

## 2015-07-15 DIAGNOSIS — M542 Cervicalgia: Secondary | ICD-10-CM

## 2015-07-15 DIAGNOSIS — M25512 Pain in left shoulder: Secondary | ICD-10-CM

## 2015-07-15 NOTE — Therapy (Signed)
[place], [place], [address] Phone: [phone]   Fax:  [phone]  Physical Therapy Treatment  Patient Details  Name: Madeline [name] MRN: [id number] Date of Birth: [birth year] No Data Recorded  Encounter Date: 07/15/2015      PT End of Session - 07/15/15 1709    Visit Number 5   Date for PT Re-Evaluation 08/25/15   PT Start Time 1527   PT Stop Time 1630   PT Time Calculation (min) 63 min   Activity Tolerance Patient tolerated treatment well   Behavior During Therapy [facility] for tasks assessed/performed      Past Medical History  Diagnosis Date  . Hyperlipidemia   . Cystitis 2007    sepsis post bladder biopsy, Dr Jeffie Pollock  . Headache(784.0) 09-13-11    less frequent to none at present  . TMJ click 27-03-50    right side > left  . Cataracts, both eyes 09-13-11    not surgical yet  . Hypertension 09-13-11    tx. meds  . 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 GERD (gastroesophageal reflux disease) 09-14-11    tx. Nexium  . Fibromyalgia 09-14-11    Dr Donney Dice, [facility]  . Arthritis 09-14-11    osteoarthritis, osteopenia  . Strep throat 09-14-11    none in a yr.  . Fractures 09-14-11    toes-left foot  . Edema 09-14-11    occ. ankles/ feet > left leg  . Heart murmur 09-13-11  . Anxiety   . History of knee replacement 09/21/2011    left  . 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 H/O foot surgery 12/19/2011    Past Surgical History  Procedure Laterality Date  . Tonsillectomy    . Sigmoidoscopy    . Colonoscopy  2011    negative  . Sling procedure  2006    a and p repair  . Nasal sinus surgery  09-14-11    right side " sinus lift"  . Total knee arthroplasty  09/21/2011    Procedure: TOTAL KNEE ARTHROPLASTY;  Surgeon: Gearlean Alf;  [facility];  Service: Orthopedics;  Laterality: Left;  . Foot  arthrodesis  01/03/12    [facility]-- left hallux MP joint arthrodesis  . Hammer toe surgery  01/03/12    [facility], Dr Doran Durand  . Abdominal hysterectomy  1977    dysfunctional menses; age 25  . Appendectomy  1963  . Cystostomy w/ bladder biopsy  04-2006  . Total knee arthroplasty  06/25/2012    Procedure: TOTAL KNEE ARTHROPLASTY;  Surgeon: Gearlean Alf, MD;  [facility];  Service: Orthopedics;  Laterality: Right;    There were no vitals filed for this visit.  Visit Diagnosis:  Left shoulder pain  Neck pain  Shoulder stiffness, left      Subjective Assessment - 07/15/15 1704    Subjective I think I am on the right track, feeling a little better.   Currently in Pain? Yes   Pain Score 4    Pain Location Shoulder   Pain Orientation Left   Aggravating Factors  reaching   Pain Relieving Factors treatment                         [facility] Adult PT Treatment/Exercise - 07/15/15 0001    Neck  Exercises: Standing   Neck Retraction 15 reps   Other Standing Exercises scapular stabilization red tband 3 ways   Other Standing Exercises 4# shrugs and upper trap and levator stretches   Moist Heat Therapy   Number Minutes Moist Heat 15 Minutes   Moist Heat Location Cervical   Electrical Stimulation   Electrical Stimulation Location left upper trap   Electrical Stimulation Action IFC   Electrical Stimulation Parameters tolerance   Electrical Stimulation Goals Pain   Ultrasound   Ultrasound Location left upper trap   Ultrasound Parameters 100% 1.5w/cm2   Ultrasound Goals Pain   Manual Therapy   Manual Therapy Soft tissue mobilization;Neural Stretch   Manual therapy comments occipital release   Soft tissue mobilization to left upper trap, rhomboid and c-p-spinals   Neural Stretch left UE                  PT Short Term Goals - 07/07/15 1444    PT SHORT TERM GOAL #1   Title independent with initial HEP   Status Achieved           PT Long  Term Goals - 07/15/15 1711    PT LONG TERM GOAL #1   Title understand proper posture and body mechanics   Status Partially Met               Plan - 07/15/15 1710    Clinical Impression Statement Still with large knot in the left upper trap., reports that the stretches really feel good.   PT Next Visit Plan slowly add exercises and see how she responds   Consulted and Agree with Plan of Care Patient        Problem List Patient Active Problem List   Diagnosis Date Noted  . OA (osteoarthritis) of knee 06/25/2012  . 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 Osteopenia 03/05/2008  . Hyperlipidemia 12/21/2007  . Essential hypertension 12/21/2007  . GERD 12/21/2007  . CONSTIPATION, CHRONIC 12/21/2007  . FIBROMYALGIA 12/21/2007  . Memory loss 12/21/2007    Sumner Boast., PT 07/15/2015, 5:15 PM  [place] [address] [place], [place], [address] Phone: [phone]   Fax:  [phone]  Name: Madeline [name] MRN: [id number] Date of Birth: 05-01-1946

## 2015-07-17 ENCOUNTER — Ambulatory Visit: Payer: Medicare Other | Admitting: Physical Therapy

## 2015-07-17 ENCOUNTER — Encounter: Payer: Self-pay | Admitting: Physical Therapy

## 2015-07-17 DIAGNOSIS — M542 Cervicalgia: Secondary | ICD-10-CM

## 2015-07-17 DIAGNOSIS — M25512 Pain in left shoulder: Secondary | ICD-10-CM

## 2015-07-17 DIAGNOSIS — M25612 Stiffness of left shoulder, not elsewhere classified: Secondary | ICD-10-CM

## 2015-07-17 NOTE — Therapy (Signed)
[place], [place], [address] Phone: [phone]   Fax:  [phone]  Physical Therapy Treatment  Patient Details  Name: Madeline [name] MRN: [id number] Date of Birth: 11-30-1945 No Data Recorded  Encounter Date: 07/17/2015      PT End of Session - 07/17/15 1039    Visit Number 6   Date for PT Re-Evaluation 08/25/15   PT Start Time 0929   PT Stop Time 1020   PT Time Calculation (min) 51 min   Activity Tolerance Patient tolerated treatment well   Behavior During Therapy [facility] for tasks assessed/performed      Past Medical History  Diagnosis Date  . Hyperlipidemia   . Cystitis 2007    sepsis post bladder biopsy, Dr Jeffie Pollock  . Headache(784.0) 09-13-11    less frequent to none at present  . TMJ click 62-70-35    right side > left  . Cataracts, both eyes 09-13-11    not surgical yet  . Hypertension 09-13-11    tx. meds  . 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 GERD (gastroesophageal reflux disease) 09-14-11    tx. Nexium  . Fibromyalgia 09-14-11    Dr Donney Dice, [facility]  . Arthritis 09-14-11    osteoarthritis, osteopenia  . Strep throat 09-14-11    none in a yr.  . Fractures 09-14-11    toes-left foot  . Edema 09-14-11    occ. ankles/ feet > left leg  . Heart murmur 09-13-11  . Anxiety   . History of knee replacement 09/21/2011    left  . 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 H/O foot surgery 12/19/2011    Past Surgical History  Procedure Laterality Date  . Tonsillectomy    . Sigmoidoscopy    . Colonoscopy  2011    negative  . Sling procedure  2006    a and p repair  . Nasal sinus surgery  09-14-11    right side " sinus lift"  . Total knee arthroplasty  09/21/2011    Procedure: TOTAL KNEE ARTHROPLASTY;  Surgeon: Gearlean Alf;  [facility];  Service: Orthopedics;  Laterality: Left;  . Foot  arthrodesis  01/03/12    [place] [facility]-- left hallux MP joint arthrodesis  . Hammer toe surgery  01/03/12    [facility], Dr Doran Durand  . Abdominal hysterectomy  1977    dysfunctional menses; age 39  . Appendectomy  1963  . Cystostomy w/ bladder biopsy  04-2006  . Total knee arthroplasty  06/25/2012    Procedure: TOTAL KNEE ARTHROPLASTY;  Surgeon: Gearlean Alf, MD;  [facility];  Service: Orthopedics;  Laterality: Right;    There were no vitals filed for this visit.  Visit Diagnosis:  Left shoulder pain  Neck pain  Shoulder stiffness, left      Subjective Assessment - 07/17/15 0929    Subjective My neck is really tight.   Currently in Pain? Yes   Pain Score 4    Pain Location Neck   Pain Orientation Left   Pain Descriptors / Indicators Spasm;Tightness;Sore                         [facility] Adult PT Treatment/Exercise - 07/17/15 0001    Neck Exercises: Standing   Other Standing Exercises scapular stabilization red tband 3  ways   Other Standing Exercises 4# shrugs and upper trap and levator stretches   Moist Heat Therapy   Number Minutes Moist Heat 15 Minutes   Moist Heat Location Cervical   Electrical Stimulation   Electrical Stimulation Location left upper trap   Electrical Stimulation Action IFC   Electrical Stimulation Parameters tolerance   Electrical Stimulation Goals Pain   Ultrasound   Ultrasound Location left upper trap   Ultrasound Parameters 100% 1MHz   Ultrasound Goals Pain   Manual Therapy   Manual Therapy Soft tissue mobilization;Neural Stretch   Manual therapy comments occipital release   Soft tissue mobilization to left upper trap, rhomboid and c-p-spinals   Neural Stretch left UE                  PT Short Term Goals - 07/07/15 1444    PT SHORT TERM GOAL #1   Title independent with initial HEP   Status Achieved           PT Long Term Goals - 07/15/15 1711    PT LONG TERM GOAL #1   Title understand proper posture  and body mechanics   Status Partially Met               Plan - 07/17/15 1040    Clinical Impression Statement I was in the car all day yesterday and now my neck is really tight.   PT Next Visit Plan slowly add exercises and see how she responds   Consulted and Agree with Plan of Care Patient        Problem List Patient Active Problem List   Diagnosis Date Noted  . OA (osteoarthritis) of knee 06/25/2012  . 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 Osteopenia 03/05/2008  . Hyperlipidemia 12/21/2007  . Essential hypertension 12/21/2007  . GERD 12/21/2007  . CONSTIPATION, CHRONIC 12/21/2007  . FIBROMYALGIA 12/21/2007  . Memory loss 12/21/2007    Sumner Boast., PT 07/17/2015, 10:42 AM  [facility] [address] [place], [place], [address] Phone: [phone]   Fax:  [phone]  Name: Madeline [name] MRN: [id number] Date of Birth: 10/03/1945

## 2015-07-20 ENCOUNTER — Ambulatory Visit: Payer: Medicare Other | Admitting: Physical Therapy

## 2015-07-20 DIAGNOSIS — M542 Cervicalgia: Secondary | ICD-10-CM

## 2015-07-20 DIAGNOSIS — M25612 Stiffness of left shoulder, not elsewhere classified: Secondary | ICD-10-CM

## 2015-07-20 DIAGNOSIS — M25512 Pain in left shoulder: Secondary | ICD-10-CM

## 2015-07-20 NOTE — Therapy (Signed)
[place], [place], [address] Phone: [phone]   Fax:  [phone]  Physical Therapy Treatment  Patient Details  Name: Madeline [name] MRN: [id number] Date of Birth: 06-19-1946 No Data Recorded  Encounter Date: 07/20/2015      PT End of Session - 07/20/15 1145    Visit Number 7   Date for PT Re-Evaluation 08/25/15   PT Start Time 1101   PT Stop Time 1153   PT Time Calculation (min) 52 min   Activity Tolerance Patient tolerated treatment well   Behavior During Therapy [facility] for tasks assessed/performed      Past Medical History  Diagnosis Date  . Hyperlipidemia   . Cystitis 2007    sepsis post bladder biopsy, Dr Jeffie Pollock  . Headache(784.0) 09-13-11    less frequent to none at present  . TMJ click [id number]    right side > left  . Cataracts, both eyes 09-13-11    not surgical yet  . Hypertension 09-13-11    tx. meds  . 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 GERD (gastroesophageal reflux disease) 09-14-11    tx. Nexium  . Fibromyalgia 09-14-11    Dr Donney Dice, [facility]  . Arthritis 09-14-11    osteoarthritis, osteopenia  . Strep throat 09-14-11    none in a yr.  . Fractures 09-14-11    toes-left foot  . Edema 09-14-11    occ. ankles/ feet > left leg  . Heart murmur 09-13-11  . Anxiety   . History of knee replacement 09/21/2011    left  . 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 H/O foot surgery 12/19/2011    Past Surgical History  Procedure Laterality Date  . Tonsillectomy    . Sigmoidoscopy    . Colonoscopy  2011    negative  . Sling procedure  2006    a and p repair  . Nasal sinus surgery  09-14-11    right side " sinus lift"  . Total knee arthroplasty  09/21/2011    Procedure: TOTAL KNEE ARTHROPLASTY;  Surgeon: Gearlean Alf;  Location: [facility];  Service: Orthopedics;  Laterality: Left;  . Foot  arthrodesis  01/03/12    [place] [facility]-- left hallux MP joint arthrodesis  . Hammer toe surgery  01/03/12    [facility], Dr Doran Durand  . Abdominal hysterectomy  1977    dysfunctional menses; age 26  . Appendectomy  1963  . Cystostomy w/ bladder biopsy  04-2006  . Total knee arthroplasty  06/25/2012    Procedure: TOTAL KNEE ARTHROPLASTY;  Surgeon: Gearlean Alf, MD;  Location: [facility];  Service: Orthopedics;  Laterality: Right;    There were no vitals filed for this visit.  Visit Diagnosis:  Left shoulder pain  Neck pain  Shoulder stiffness, left      Subjective Assessment - 07/20/15 1137    Subjective I felt quite a bit better, but trying to have family over and doing the cooking and cleaning increases the stress and pain I feel   Currently in Pain? Yes   Pain Score 5    Pain Location Neck   Pain Orientation Left   Pain Descriptors / Indicators Spasm;Tightness   Pain Type Chronic pain   Pain Onset More than a month ago   Pain Frequency Constant   Aggravating Factors  stress  Pain Relieving Factors treatment                         [facility] Adult PT Treatment/Exercise - 07/20/15 0001    Moist Heat Therapy   Number Minutes Moist Heat 15 Minutes   Moist Heat Location Cervical   Electrical Stimulation   Electrical Stimulation Location left upper trap   Electrical Stimulation Action IFC   Electrical Stimulation Parameters tolerance   Electrical Stimulation Goals Pain   Ultrasound   Ultrasound Location left upper trap   Ultrasound Parameters 100% 1MHz   Ultrasound Goals Pain   Manual Therapy   Manual Therapy Soft tissue mobilization;Neural Stretch   Manual therapy comments occipital release   Soft tissue mobilization to left upper trap, rhomboid and c-p-spinals   Neural Stretch left UE                  PT Short Term Goals - 07/07/15 1444    PT SHORT TERM GOAL #1   Title independent with initial HEP   Status Achieved           PT Long  Term Goals - 07/20/15 1147    PT LONG TERM GOAL #1   Title understand proper posture and body mechanics   Status Partially Met   PT LONG TERM GOAL #2   Title decrease pain 50%   Status On-going               Plan - 07/20/15 1145    Clinical Impression Statement Patient with very tight and sore mms in the left upper trap, rhomboid and cervical paraspinals.  With [facility] therapy we can get these to be less with better ROM and less pain.  Stress seems to be a contributor in the pain   PT Next Visit Plan see if she can add exercises   Consulted and Agree with Plan of Care Patient        Problem List Patient Active Problem List   Diagnosis Date Noted  . OA (osteoarthritis) of knee 06/25/2012  . 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 Osteopenia 03/05/2008  . Hyperlipidemia 12/21/2007  . Essential hypertension 12/21/2007  . GERD 12/21/2007  . CONSTIPATION, CHRONIC 12/21/2007  . FIBROMYALGIA 12/21/2007  . Memory loss 12/21/2007    Sumner Boast., PT 07/20/2015, 11:48 AM  [place] [address] [place], [place], [address] Phone: [phone]   Fax:  [phone]  Name: Madeline [name] MRN: [id number] Date of Birth: September 13, 1946

## 2015-07-22 ENCOUNTER — Ambulatory Visit: Payer: Medicare Other | Attending: Internal Medicine | Admitting: Physical Therapy

## 2015-07-22 ENCOUNTER — Encounter: Payer: Self-pay | Admitting: Physical Therapy

## 2015-07-22 DIAGNOSIS — M542 Cervicalgia: Secondary | ICD-10-CM

## 2015-07-22 DIAGNOSIS — M25512 Pain in left shoulder: Secondary | ICD-10-CM | POA: Diagnosis not present

## 2015-07-22 DIAGNOSIS — M25612 Stiffness of left shoulder, not elsewhere classified: Secondary | ICD-10-CM | POA: Diagnosis present

## 2015-07-22 NOTE — Therapy (Signed)
[facility] [place] [address] [place], [place], [address] Phone: [phone]   Fax:  [phone]  Physical Therapy Treatment  Patient Details  Name: Madeline [name] MRN: [id number] Date of Birth: September 30, 1945 No Data Recorded  Encounter Date: 07/22/2015      PT End of Session - 07/22/15 1650    Visit Number 8   Date for PT Re-Evaluation 08/25/15   PT Start Time 1611   PT Stop Time 1708   PT Time Calculation (min) 57 min   Activity Tolerance Patient tolerated treatment well   Behavior During Therapy [facility] for tasks assessed/performed      Past Medical History  Diagnosis Date  . Hyperlipidemia   . Cystitis 2007    sepsis post bladder biopsy, Dr Annabell Howells  . Headache(784.0) 09-13-11    less frequent to none at present  . TMJ click 09-13-11    right side > left  . Cataracts, both eyes 09-13-11    not surgical yet  . Hypertension 09-13-11    tx. meds  . 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 GERD (gastroesophageal reflux disease) 09-14-11    tx. Nexium  . Fibromyalgia 09-14-11    Dr Dian Queen, [facility]  . Arthritis 09-14-11    osteoarthritis, osteopenia  . Strep throat 09-14-11    none in a yr.  . Fractures 09-14-11    toes-left foot  . Edema 09-14-11    occ. ankles/ feet > left leg  . Heart murmur 09-13-11  . Anxiety   . History of knee replacement 09/21/2011    left  . 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 H/O foot surgery 12/19/2011    Past Surgical History  Procedure Laterality Date  . Tonsillectomy    . Sigmoidoscopy    . Colonoscopy  2011    negative  . Sling procedure  2006    a and p repair  . Nasal sinus surgery  09-14-11    right side " sinus lift"  . Total knee arthroplasty  09/21/2011    Procedure: TOTAL KNEE ARTHROPLASTY;  Surgeon: Loanne Drilling;  [facility];  Service: Orthopedics;  Laterality: Left;  . Foot  arthrodesis  01/03/12    [place] [facility]-- left hallux MP joint arthrodesis  . Hammer toe surgery  01/03/12    [facility], Dr Victorino Dike  . Abdominal hysterectomy  1977    dysfunctional menses; age 42  . Appendectomy  1963  . Cystostomy w/ bladder biopsy  04-2006  . Total knee arthroplasty  06/25/2012    Procedure: TOTAL KNEE ARTHROPLASTY;  Surgeon: Loanne Drilling, MD;  [facility];  Service: Orthopedics;  Laterality: Right;    There were no vitals filed for this visit.  Visit Diagnosis:  Left shoulder pain  Neck pain  Shoulder stiffness, left      Subjective Assessment - 07/22/15 1613    Subjective Doing better, some soreness with after Halloween cleaning.   Currently in Pain? Yes   Pain Score 4    Pain Location Neck   Pain Orientation Left   Pain Descriptors / Indicators Spasm                         [facility] Adult PT Treatment/Exercise - 07/22/15 0001    Neck Exercises: Standing   Neck Retraction 15 reps  Other Standing Exercises scapular stabilization red tband 3 ways   Other Standing Exercises 4# shrugs and upper trap and levator stretches, NuStep Level 5 x 5 minutes   Moist Heat Therapy   Number Minutes Moist Heat 15 Minutes   Moist Heat Location Cervical   Electrical Stimulation   Electrical Stimulation Location left upper trap   Electrical Stimulation Action IFC   Electrical Stimulation Parameters tolerance   Electrical Stimulation Goals Pain   Manual Therapy   Manual Therapy Soft tissue mobilization;Neural Stretch   Manual therapy comments occipital release   Soft tissue mobilization to left upper trap, rhomboid and c-p-spinals   Neural Stretch left UE                  PT Short Term Goals - 07/07/15 1444    PT SHORT TERM GOAL #1   Title independent with initial HEP   Status Achieved           PT Long Term Goals - 07/20/15 1147    PT LONG TERM GOAL #1   Title understand proper posture and body mechanics   Status Partially  Met   PT LONG TERM GOAL #2   Title decrease pain 50%   Status On-going               Plan - 07/22/15 1651    Clinical Impression Statement Reports that she always feels "so much Better" after treatment here, she is going to try to go back to do some exercise at the gy,   PT Next Visit Plan slowly add exercises   Consulted and Agree with Plan of Care Patient        Problem List Patient Active Problem List   Diagnosis Date Noted  . OA (osteoarthritis) of knee 06/25/2012  . 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 Osteopenia 03/05/2008  . Hyperlipidemia 12/21/2007  . Essential hypertension 12/21/2007  . GERD 12/21/2007  . CONSTIPATION, CHRONIC 12/21/2007  . FIBROMYALGIA 12/21/2007  . Memory loss 12/21/2007    Sumner Boast., PT 07/22/2015, 4:53 PM  [place] [address] [place], [place], [address] Phone: [phone]   Fax:  [phone]  Name: CRISTAL QADIR MRN: [id number] Date of Birth: 03-29-1946

## 2015-07-28 ENCOUNTER — Ambulatory Visit: Payer: Medicare Other | Admitting: Physical Therapy

## 2015-07-28 ENCOUNTER — Encounter: Payer: Self-pay | Admitting: Physical Therapy

## 2015-07-28 DIAGNOSIS — M542 Cervicalgia: Secondary | ICD-10-CM

## 2015-07-28 DIAGNOSIS — M25512 Pain in left shoulder: Secondary | ICD-10-CM

## 2015-07-28 DIAGNOSIS — M25612 Stiffness of left shoulder, not elsewhere classified: Secondary | ICD-10-CM

## 2015-07-28 NOTE — Therapy (Signed)
[place], [place], [address] Phone: [phone]   Fax:  [phone]  Physical Therapy Treatment  Patient Details  Name: Madeline [name] MRN: [id number] Date of Birth: 1946/03/17 No Data Recorded  Encounter Date: 07/28/2015      PT End of Session - 07/28/15 1613    Visit Number 9   Date for PT Re-Evaluation 08/25/15   PT Start Time 1529   PT Stop Time 1631   PT Time Calculation (min) 62 min   Activity Tolerance Patient tolerated treatment well   Behavior During Therapy [facility] for tasks assessed/performed      Past Medical History  Diagnosis Date  . Hyperlipidemia   . Cystitis 2007    sepsis post bladder biopsy, Dr Jeffie Pollock  . Headache(784.0) 09-13-11    less frequent to none at present  . TMJ click [id number]    right side > left  . Cataracts, both eyes 09-13-11    not surgical yet  . Hypertension 09-13-11    tx. meds  . 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 GERD (gastroesophageal reflux disease) 09-14-11    tx. Nexium  . Fibromyalgia 09-14-11    Dr Donney Dice, [facility]  . Arthritis 09-14-11    osteoarthritis, osteopenia  . Strep throat 09-14-11    none in a yr.  . Fractures 09-14-11    toes-left foot  . Edema 09-14-11    occ. ankles/ feet > left leg  . Heart murmur 09-13-11  . Anxiety   . History of knee replacement 09/21/2011    left  . 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 H/O foot surgery 12/19/2011    Past Surgical History  Procedure Laterality Date  . Tonsillectomy    . Sigmoidoscopy    . Colonoscopy  2011    negative  . Sling procedure  2006    a and p repair  . Nasal sinus surgery  09-14-11    right side " sinus lift"  . Total knee arthroplasty  09/21/2011    Procedure: TOTAL KNEE ARTHROPLASTY;  Surgeon: Gearlean Alf;  [facility];  Service: Orthopedics;  Laterality: Left;  . Foot  arthrodesis  01/03/12    [place] [facility]-- left hallux MP joint arthrodesis  . Hammer toe surgery  01/03/12    [facility], Dr Doran Durand  . Abdominal hysterectomy  1977    dysfunctional menses; age 50  . Appendectomy  1963  . Cystostomy w/ bladder biopsy  04-2006  . Total knee arthroplasty  06/25/2012    Procedure: TOTAL KNEE ARTHROPLASTY;  Surgeon: Gearlean Alf, MD;  [facility];  Service: Orthopedics;  Laterality: Right;    There were no vitals filed for this visit.  Visit Diagnosis:  Left shoulder pain  Neck pain  Shoulder stiffness, left      Subjective Assessment - 07/28/15 1531    Subjective My left shoulder is hurting some.   Currently in Pain? Yes   Pain Score 4    Pain Location Neck  upper trap   Pain Orientation Left   Pain Descriptors / Indicators Spasm   Aggravating Factors  activity   Pain Relieving Factors treatment and rest                         [facility] Adult PT Treatment/Exercise -  07/28/15 0001    Neck Exercises: Standing   Neck Retraction 15 reps   Other Standing Exercises scapular stabilization red tband 3 ways   Other Standing Exercises 4# shrugs and upper trap and levator stretches, NuStep Level 5 x 5 minutes   Moist Heat Therapy   Number Minutes Moist Heat 15 Minutes   Moist Heat Location Cervical   Electrical Stimulation   Electrical Stimulation Location left upper trap   Electrical Stimulation Action IFC   Electrical Stimulation Parameters to tolerance   Electrical Stimulation Goals Pain   Manual Therapy   Manual Therapy Soft tissue mobilization;Neural Stretch   Manual therapy comments occipital release   Soft tissue mobilization to left upper trap, rhomboid and c-p-spinals   Neural Stretch left UE                  PT Short Term Goals - 07/07/15 1444    PT SHORT TERM GOAL #1   Title independent with initial HEP   Status Achieved           PT Long Term Goals - 07/28/15 1615    PT LONG TERM GOAL #1    Title understand proper posture and body mechanics   Status Achieved               Plan - 07/28/15 1614    Clinical Impression Statement Reports today that she feels tighter from over the weekend, int he neck and with some pain in the the left arm.     PT Next Visit Plan slowly add exercises   Consulted and Agree with Plan of Care Patient        Problem List Patient Active Problem List   Diagnosis Date Noted  . OA (osteoarthritis) of knee 06/25/2012  . 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 Osteopenia 03/05/2008  . Hyperlipidemia 12/21/2007  . Essential hypertension 12/21/2007  . GERD 12/21/2007  . CONSTIPATION, CHRONIC 12/21/2007  . FIBROMYALGIA 12/21/2007  . Memory loss 12/21/2007    Sumner Boast., PT 07/28/2015, 4:19 PM  [place] [address] [place], [place], [address] Phone: [phone]   Fax:  [phone]  Name: Madeline [name] MRN: [id number] Date of Birth: 01/25/1946

## 2015-07-31 ENCOUNTER — Ambulatory Visit: Payer: Medicare Other | Admitting: Physical Therapy

## 2015-07-31 ENCOUNTER — Encounter: Payer: Self-pay | Admitting: Physical Therapy

## 2015-07-31 DIAGNOSIS — M25612 Stiffness of left shoulder, not elsewhere classified: Secondary | ICD-10-CM

## 2015-07-31 DIAGNOSIS — M25512 Pain in left shoulder: Secondary | ICD-10-CM | POA: Diagnosis not present

## 2015-07-31 DIAGNOSIS — M542 Cervicalgia: Secondary | ICD-10-CM

## 2015-07-31 NOTE — Therapy (Signed)
[place], [place], [address] Phone: [phone]   Fax:  [phone]  Physical Therapy Treatment  Patient Details  Name: Madeline [name] MRN: [id number] Date of Birth: 10-12-1945 No Data Recorded  Encounter Date: 07/31/2015      PT End of Session - 07/31/15 1053    Visit Number 10   Date for PT Re-Evaluation 08/25/15   PT Start Time 1014   PT Stop Time 1110   PT Time Calculation (min) 56 min   Activity Tolerance Patient tolerated treatment well   Behavior During Therapy [facility] for tasks assessed/performed      Past Medical History  Diagnosis Date  . Hyperlipidemia   . Cystitis 2007    sepsis post bladder biopsy, Dr Jeffie Pollock  . Headache(784.0) 09-13-11    less frequent to none at present  . TMJ click [id number]    right side > left  . Cataracts, both eyes 09-13-11    not surgical yet  . Hypertension 09-13-11    tx. meds  . 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 GERD (gastroesophageal reflux disease) 09-14-11    tx. Nexium  . Fibromyalgia 09-14-11    Dr Donney Dice, [facility]  . Arthritis 09-14-11    osteoarthritis, osteopenia  . Strep throat 09-14-11    none in a yr.  . Fractures 09-14-11    toes-left foot  . Edema 09-14-11    occ. ankles/ feet > left leg  . Heart murmur 09-13-11  . Anxiety   . History of knee replacement 09/21/2011    left  . 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 H/O foot surgery 12/19/2011    Past Surgical History  Procedure Laterality Date  . Tonsillectomy    . Sigmoidoscopy    . Colonoscopy  2011    negative  . Sling procedure  2006    a and p repair  . Nasal sinus surgery  09-14-11    right side " sinus lift"  . Total knee arthroplasty  09/21/2011    Procedure: TOTAL KNEE ARTHROPLASTY;  Surgeon: Gearlean Alf;  [facility];  Service: Orthopedics;  Laterality: Left;  . Foot  arthrodesis  01/03/12     ortho-- left hallux MP joint arthrodesis  . Hammer toe surgery  01/03/12    [facility], Dr Doran Durand  . Abdominal hysterectomy  1977    dysfunctional menses; age 38  . Appendectomy  1963  . Cystostomy w/ bladder biopsy  04-2006  . Total knee arthroplasty  06/25/2012    Procedure: TOTAL KNEE ARTHROPLASTY;  Surgeon: Gearlean Alf, MD;  [facility];  Service: Orthopedics;  Laterality: Right;    There were no vitals filed for this visit.  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   Currently in Pain? Yes   Pain Score 3    Pain Location Neck   Pain Orientation Left   Pain Descriptors / Indicators Spasm;Tightness;Aching   Pain Type Chronic pain            [facility] PT Assessment - 07/31/15 0001    AROM   Left Shoulder Flexion 130 Degrees  Left Shoulder ABduction 124 Degrees   Left Shoulder Internal Rotation 25 Degrees   Left Shoulder External Rotation 65 Degrees                     [facility] Adult PT Treatment/Exercise - Aug 15, 2015 0001    Neck Exercises: Standing   Neck Retraction 15 reps   Other Standing Exercises scapular stabilization red tband 3 ways   Other Standing Exercises 5# shrugs and upper trap and levator stretches, NuStep Level 5 x 5 minutes   Moist Heat Therapy   Number Minutes Moist Heat 15 Minutes   Moist Heat Location Cervical   Electrical Stimulation   Electrical Stimulation Location left upper trap   Electrical Stimulation Action IFC   Electrical Stimulation Parameters tolerance   Electrical Stimulation Goals Pain   Manual Therapy   Manual Therapy Soft tissue mobilization;Neural Stretch   Manual therapy comments occipital release   Soft tissue mobilization to left upper trap, rhomboid and c-p-spinals   Neural Stretch left UE                   PT Short Term Goals - 07/07/15 1444    PT SHORT TERM GOAL #1   Title independent with initial HEP   Status Achieved           PT Long Term Goals - 08/15/15 1054    PT LONG TERM GOAL #2   Title decrease pain 50%   Status On-going               Plan - August 15, 2015 1053    Clinical Impression Statement Left upper trap remains tight with knots, she does report doing hair with less difficulty, exhibited increased left shoulder ROM   PT Next Visit Plan continue to work on the trigger point, may try us/estim combo   Consulted and Agree with Plan of Care Patient          G-Codes - August 15, 2015 1054    Functional Assessment Tool Used foto   Functional Limitation Other PT primary   Other PT Primary Current Status [id number]) At least 40 percent but less than 60 percent impaired, limited or restricted   Other PT Primary Goal Status [id number]) At least 40 percent but less than 60 percent impaired, limited or restricted      Problem List Patient Active Problem List   Diagnosis Date Noted  . OA (osteoarthritis) of knee 06/25/2012  . 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 Osteopenia 03/05/2008  . Hyperlipidemia 12/21/2007  . Essential hypertension 12/21/2007  . GERD 12/21/2007  . CONSTIPATION, CHRONIC 12/21/2007  . FIBROMYALGIA 12/21/2007  . Memory loss 12/21/2007    Sumner Boast., PT 2015/08/15, 11:27 AM  [address] [place], [place], [address] Phone: [phone]   Fax:  [phone]  Name: Madeline [name] MRN: [id number] Date of Birth: 1946/01/07

## 2015-08-03 ENCOUNTER — Ambulatory Visit: Payer: Medicare Other | Admitting: Physical Therapy

## 2015-08-03 ENCOUNTER — Encounter: Payer: Self-pay | Admitting: Physical Therapy

## 2015-08-03 DIAGNOSIS — M25512 Pain in left shoulder: Secondary | ICD-10-CM | POA: Diagnosis not present

## 2015-08-03 DIAGNOSIS — M542 Cervicalgia: Secondary | ICD-10-CM

## 2015-08-03 DIAGNOSIS — M25612 Stiffness of left shoulder, not elsewhere classified: Secondary | ICD-10-CM

## 2015-08-03 NOTE — Therapy (Signed)
[place] [address] [place], [place], [address] Phone: [phone]   Fax:  [phone]  Physical Therapy Treatment  Patient Details  Name: Madeline [name] MRN: [id number] Date of Birth: 1946-04-28 No Data Recorded  Encounter Date: 08/03/2015      PT End of Session - 08/03/15 1650    Visit Number 11   Date for PT Re-Evaluation 08/25/15   PT Start Time 1616   PT Stop Time 1708   PT Time Calculation (min) 52 min   Activity Tolerance Patient tolerated treatment well   Behavior During Therapy [facility] for tasks assessed/performed      Past Medical History  Diagnosis Date  . Hyperlipidemia   . Cystitis 2007    sepsis post bladder biopsy, Dr Jeffie Pollock  . Headache(784.0) 09-13-11    less frequent to none at present  . TMJ click [id number]    right side > left  . Cataracts, both eyes 09-13-11    not surgical yet  . Hypertension 09-13-11    tx. meds  . 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 GERD (gastroesophageal reflux disease) 09-14-11    tx. Nexium  . Fibromyalgia 09-14-11    Dr Donney Dice, [facility]  . Arthritis 09-14-11    osteoarthritis, osteopenia  . Strep throat 09-14-11    none in a yr.  . Fractures 09-14-11    toes-left foot  . Edema 09-14-11    occ. ankles/ feet > left leg  . Heart murmur 09-13-11  . Anxiety   . History of knee replacement 09/21/2011    left  . 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 H/O foot surgery 12/19/2011    Past Surgical History  Procedure Laterality Date  . Tonsillectomy    . Sigmoidoscopy    . Colonoscopy  2011    negative  . Sling procedure  2006    a and p repair  . Nasal sinus surgery  09-14-11    right side " sinus lift"  . Total knee arthroplasty  09/21/2011    Procedure: TOTAL KNEE ARTHROPLASTY;  Surgeon: Gearlean Alf;  [facility];  Service: Orthopedics;  Laterality: Left;  . Foot  arthrodesis  01/03/12    [facility]-- left hallux MP joint arthrodesis  . Hammer toe surgery  01/03/12    [facility], Dr Doran Durand  . Abdominal hysterectomy  1977    dysfunctional menses; age 70  . Appendectomy  1963  . Cystostomy w/ bladder biopsy  04-2006  . Total knee arthroplasty  06/25/2012    Procedure: TOTAL KNEE ARTHROPLASTY;  Surgeon: Gearlean Alf, MD;  [facility];  Service: Orthopedics;  Laterality: Right;    There were no vitals filed for this visit.  Visit Diagnosis:  Left shoulder pain  Neck pain  Shoulder stiffness, left      Subjective Assessment - 08/03/15 1646    Subjective I can move my arm better, it still hurts to reach out and up.     Currently in Pain? Yes   Pain Score 4    Pain Location Shoulder   Pain Orientation Left   Pain Descriptors / Indicators Aching   Aggravating Factors  reaching out and up   Pain Relieving Factors rest and the treatment  [place] Adult PT Treatment/Exercise - 08/03/15 0001    Neck Exercises: Standing   Other Standing Exercises scapular stabilization red tband 3 ways, W and X to Y exercises   Other Standing Exercises 5# shrugs and upper trap and levator stretches, NuStep Level 5 x 5 minutes   Moist Heat Therapy   Number Minutes Moist Heat 15 Minutes   Moist Heat Location Cervical   Electrical Stimulation   Electrical Stimulation Location left upper trap   Electrical Stimulation Action IFC   Electrical Stimulation Parameters tolerance    Electrical Stimulation Goals Pain   Manual Therapy   Manual Therapy Soft tissue mobilization;Neural Stretch   Manual therapy comments occipital release   Soft tissue mobilization to left upper trap, rhomboid and c-p-spinals   Neural Stretch left UE                  PT Short Term Goals - 07/07/15 1444    PT SHORT TERM GOAL #1   Title independent with initial HEP   Status Achieved           PT Long Term Goals - 08/03/15  1655    PT LONG TERM GOAL #2   Title decrease pain 50%   Status On-going   PT LONG TERM GOAL #3   Title increase left shoulder AROM to 150 degrees flexion   Status On-going               Plan - 08/03/15 1652    Clinical Impression Statement She is very tight in the left UE.  Had difficyulty going into ER of the left shoulder.  Had tightness in the upper traps.   PT Next Visit Plan continue to work on the trigger point, may try us/estim combo   Consulted and Agree with Plan of Care Patient        Problem List Patient Active Problem List   Diagnosis Date Noted  . OA (osteoarthritis) of knee 06/25/2012  . 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 Osteopenia 03/05/2008  . Hyperlipidemia 12/21/2007  . Essential hypertension 12/21/2007  . GERD 12/21/2007  . CONSTIPATION, CHRONIC 12/21/2007  . FIBROMYALGIA 12/21/2007  . Memory loss 12/21/2007    Sumner Boast., PT 08/03/2015, 4:56 PM  [address] [place], [place], [address] Phone: [phone]   Fax:  [phone]  Name: Madeline [name] MRN: [id number] Date of Birth: Oct 09, 1945

## 2015-08-07 ENCOUNTER — Encounter: Payer: Self-pay | Admitting: Physical Therapy

## 2015-08-07 ENCOUNTER — Ambulatory Visit: Payer: Medicare Other | Admitting: Physical Therapy

## 2015-08-07 DIAGNOSIS — M25512 Pain in left shoulder: Secondary | ICD-10-CM

## 2015-08-07 DIAGNOSIS — M542 Cervicalgia: Secondary | ICD-10-CM

## 2015-08-07 DIAGNOSIS — M25612 Stiffness of left shoulder, not elsewhere classified: Secondary | ICD-10-CM

## 2015-08-07 NOTE — Therapy (Signed)
[place], [place], [address] Phone: [phone]   Fax:  [phone]  Physical Therapy Treatment  Patient Details  Name: Madeline [name] MRN: [id number] Date of Birth: 1946/06/23 No Data Recorded  Encounter Date: 08/07/2015      PT End of Session - 08/07/15 0936    Visit Number 12   Date for PT Re-Evaluation 08/25/15   PT Start Time 0850   PT Stop Time 0951   PT Time Calculation (min) 61 min   Activity Tolerance Patient tolerated treatment well   Behavior During Therapy [facility] for tasks assessed/performed      Past Medical History  Diagnosis Date  . Hyperlipidemia   . Cystitis 2007    sepsis post bladder biopsy, Dr Jeffie Pollock  . Headache(784.0) 09-13-11    less frequent to none at present  . TMJ click [id number]    right side > left  . Cataracts, both eyes 09-13-11    not surgical yet  . Hypertension 09-13-11    tx. meds  . 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 GERD (gastroesophageal reflux disease) 09-14-11    tx. Nexium  . Fibromyalgia 09-14-11    Dr Donney Dice, [facility]  . Arthritis 09-14-11    osteoarthritis, osteopenia  . Strep throat 09-14-11    none in a yr.  . Fractures 09-14-11    toes-left foot  . Edema 09-14-11    occ. ankles/ feet > left leg  . Heart murmur 09-13-11  . Anxiety   . History of knee replacement 09/21/2011    left  . 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 H/O foot surgery 12/19/2011    Past Surgical History  Procedure Laterality Date  . Tonsillectomy    . Sigmoidoscopy    . Colonoscopy  2011    negative  . Sling procedure  2006    a and p repair  . Nasal sinus surgery  09-14-11    right side " sinus lift"  . Total knee arthroplasty  09/21/2011    Procedure: TOTAL KNEE ARTHROPLASTY;  Surgeon: Gearlean Alf;  [facility];  Service: Orthopedics;  Laterality: Left;  . Foot  arthrodesis  01/03/12    [facility]-- left hallux MP joint arthrodesis  . Hammer toe surgery  01/03/12    [facility], Dr Doran Durand  . Abdominal hysterectomy  1977    dysfunctional menses; age 41  . Appendectomy  1963  . Cystostomy w/ bladder biopsy  04-2006  . Total knee arthroplasty  06/25/2012    Procedure: TOTAL KNEE ARTHROPLASTY;  Surgeon: Gearlean Alf, MD;  [facility];  Service: Orthopedics;  Laterality: Right;    There were no vitals filed for this visit.  Visit Diagnosis:  Left shoulder pain  Neck pain  Shoulder stiffness, left      Subjective Assessment - 08/07/15 0933    Subjective I was doing pretty good but I did some sweeping and vacuuming yesterday and my left shoulder is really hurting.   Currently in Pain? Yes   Pain Score 5    Pain Location Shoulder   Pain Orientation Left;Anterior;Posterior   Pain Descriptors / Indicators Aching   Pain Type Chronic pain            [facility] PT Assessment - 08/07/15 0001    AROM   Left Shoulder Flexion  133 Degrees   Left Shoulder ABduction 130 Degrees   Left Shoulder Internal Rotation 31 Degrees   Left Shoulder External Rotation 65 Degrees                     [facility] Adult PT Treatment/Exercise - 08/07/15 0001    Neck Exercises: Standing   Other Standing Exercises scapular stabilization red tband 3 ways, W and X to Y exercises   Other Standing Exercises 5# shrugs and upper trap and levator stretches, NuStep Level 5 x 5 minutes, 4 # biceps, 2# bent over rows   Moist Heat Therapy   Number Minutes Moist Heat 15 Minutes   Moist Heat Location Cervical   Electrical Stimulation   Electrical Stimulation Location left upper trap   Electrical Stimulation Action IFC   Electrical Stimulation Parameters totolerance   Electrical Stimulation Goals Pain   Manual Therapy   Manual Therapy Soft tissue mobilization;Neural Stretch   Manual therapy comments occipital release   Soft tissue mobilization to left upper  trap, rhomboid and c-p-spinals   Neural Stretch left UE                  PT Short Term Goals - 07/07/15 1444    PT SHORT TERM GOAL #1   Title independent with initial HEP   Status Achieved           PT Long Term Goals - 08/07/15 0943    PT LONG TERM GOAL #2   Title decrease pain 50%   Status Partially Met               Plan - 08/07/15 0941    Clinical Impression Statement She is having ups and downs as she tries to resume her normal housework activities, the knot is feeling looser and smaller, but is having pain in the shoulder joint.  I tried iontophoresis today   PT Next Visit Plan She will be very active next week with Thanksgiving, see if Ionto helped any   Consulted and Agree with Plan of Care Patient        Problem List Patient Active Problem List   Diagnosis Date Noted  . OA (osteoarthritis) of knee 06/25/2012  . 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 Osteopenia 03/05/2008  . Hyperlipidemia 12/21/2007  . Essential hypertension 12/21/2007  . GERD 12/21/2007  . CONSTIPATION, CHRONIC 12/21/2007  . FIBROMYALGIA 12/21/2007  . Memory loss 12/21/2007    Sumner Boast., PT 08/07/2015, 9:44 AM  [facility] [address] [place], [place], [address] Phone: [phone]   Fax:  [phone]  Name: Madeline [name] MRN: [id number] Date of Birth: Aug 29, 1946

## 2015-08-11 ENCOUNTER — Encounter: Payer: Self-pay | Admitting: Physical Therapy

## 2015-08-11 ENCOUNTER — Ambulatory Visit: Payer: Medicare Other | Admitting: Physical Therapy

## 2015-08-11 DIAGNOSIS — M25612 Stiffness of left shoulder, not elsewhere classified: Secondary | ICD-10-CM

## 2015-08-11 DIAGNOSIS — M542 Cervicalgia: Secondary | ICD-10-CM

## 2015-08-11 DIAGNOSIS — M25512 Pain in left shoulder: Secondary | ICD-10-CM

## 2015-08-11 NOTE — Therapy (Signed)
[address] [address] [place], [place], [address] Phone: [phone]   Fax:  [phone]  Physical Therapy Treatment  Patient Details  Name: Madeline [name] MRN: [id number] Date of Birth: 1946-02-05 No Data Recorded  Encounter Date: 08/11/2015      PT End of Session - 08/11/15 1538    Visit Number 13   Date for PT Re-Evaluation 08/25/15   PT Start Time 1450   PT Stop Time 1555   PT Time Calculation (min) 65 min      Past Medical History  Diagnosis Date  . Hyperlipidemia   . Cystitis 2007    sepsis post bladder biopsy, Dr Jeffie Pollock  . Headache(784.0) 09-13-11    less frequent to none at present  . TMJ click [id number]    right side > left  . Cataracts, both eyes 09-13-11    not surgical yet  . Hypertension 09-13-11    tx. meds  . 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 GERD (gastroesophageal reflux disease) 09-14-11    tx. Nexium  . Fibromyalgia 09-14-11    Dr Donney Dice, [facility]  . Arthritis 09-14-11    osteoarthritis, osteopenia  . Strep throat 09-14-11    none in a yr.  . Fractures 09-14-11    toes-left foot  . Edema 09-14-11    occ. ankles/ feet > left leg  . Heart murmur 09-13-11  . Anxiety   . History of knee replacement 09/21/2011    left  . 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 H/O foot surgery 12/19/2011    Past Surgical History  Procedure Laterality Date  . Tonsillectomy    . Sigmoidoscopy    . Colonoscopy  2011    negative  . Sling procedure  2006    a and p repair  . Nasal sinus surgery  09-14-11    right side " sinus lift"  . Total knee arthroplasty  09/21/2011    Procedure: TOTAL KNEE ARTHROPLASTY;  Surgeon: Gearlean Alf;  [facility];  Service: Orthopedics;  Laterality: Left;  . Foot arthrodesis  01/03/12    [facility]-- left hallux MP joint arthrodesis  . Hammer toe surgery  01/03/12    [facility], Dr Doran Durand  . Abdominal hysterectomy  1977    dysfunctional menses; age 69  . Appendectomy  1963  . Cystostomy w/ bladder biopsy  04-2006  . Total knee arthroplasty  06/25/2012    Procedure: TOTAL KNEE ARTHROPLASTY;  Surgeon: Gearlean Alf, MD;  [facility];  Service: Orthopedics;  Laterality: Right;    There were no vitals filed for this visit.  Visit Diagnosis:  Left shoulder pain  Neck pain  Shoulder stiffness, left      Subjective Assessment - 08/11/15 1457    Subjective Reports she is sore today and can not raise her left arm above her head. Was at her grandsons hockey game this weekend and celebrated him scoring and was waving her arms and has hurt more ever since.    Currently in Pain? Yes   Pain Score 4    Pain Location Shoulder   Pain Orientation Left;Anterior;Posterior   Pain Descriptors / Indicators Aching   Pain Type Chronic pain  [place] Adult PT Treatment/Exercise - 08/11/15 0001    Exercises   Exercises Shoulder   Shoulder Exercises: ROM/Strengthening   UBE (Upper Arm Bike) 2 fwd/2back  L 1.2    Moist Heat Therapy   Number Minutes Moist Heat 15 Minutes   Moist Heat Location Cervical   Electrical Stimulation   Electrical Stimulation Location left upper trap   Electrical Stimulation Action IFC   Electrical Stimulation Parameters tolerance   Electrical Stimulation Goals Pain   Ultrasound   Ultrasound Location left upper trap   Ultrasound Parameters 100% 1 MHZ    Ultrasound Goals Pain                  PT Short Term Goals - 07/07/15 1444    PT SHORT TERM GOAL #1   Title independent with initial HEP   Status Achieved           PT Long Term Goals - 08/07/15 0943    PT LONG TERM GOAL #2   Title decrease pain 50%   Status Partially Met               Plan - 08/11/15 1541    Clinical Impression Statement Pt able to complete UBE with complaints of slight discomfort with full forward  revolutions. She is willing to do whatever exercises necessary to get better, but did not want to do too much with thanksgiving coming up.    PT Next Visit Plan Continue to add exercises as tolerated.         Problem List Patient Active Problem List   Diagnosis Date Noted  . OA (osteoarthritis) of knee 06/25/2012  . 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 Osteopenia 03/05/2008  . Hyperlipidemia 12/21/2007  . Essential hypertension 12/21/2007  . GERD 12/21/2007  . CONSTIPATION, CHRONIC 12/21/2007  . FIBROMYALGIA 12/21/2007  . Memory loss 12/21/2007    Madeline [name], SPTA 08/11/2015, 3:45 PM  [address] [place], [place], [address] Phone: [phone]   Fax:  [phone]  Name: Madeline [name] MRN: [id number] Date of Birth: 03-13-1946

## 2015-08-17 ENCOUNTER — Ambulatory Visit: Payer: Medicare Other | Admitting: Physical Therapy

## 2015-08-18 ENCOUNTER — Ambulatory Visit: Payer: Medicare Other | Admitting: Physical Therapy

## 2015-08-20 ENCOUNTER — Ambulatory Visit: Payer: Medicare Other | Attending: Internal Medicine | Admitting: Physical Therapy

## 2015-08-20 ENCOUNTER — Encounter: Payer: Self-pay | Admitting: Physical Therapy

## 2015-08-20 DIAGNOSIS — M542 Cervicalgia: Secondary | ICD-10-CM | POA: Insufficient documentation

## 2015-08-20 DIAGNOSIS — M25612 Stiffness of left shoulder, not elsewhere classified: Secondary | ICD-10-CM | POA: Diagnosis present

## 2015-08-20 DIAGNOSIS — M25512 Pain in left shoulder: Secondary | ICD-10-CM | POA: Insufficient documentation

## 2015-08-20 NOTE — Therapy (Signed)
[place], [place], [address] Phone: [phone]   Fax:  [phone]  Physical Therapy Treatment  Patient Details  Name: Madeline [name] MRN: [id number] Date of Birth: 08/25/1946 No Data Recorded  Encounter Date: 08/20/2015      PT End of Session - 08/20/15 1657    Visit Number 14   Date for PT Re-Evaluation 08/25/15   PT Start Time 1552  patient 22 minutes late due to her mother's death yesterday   PT Stop Time 1638   PT Time Calculation (min) 46 min   Activity Tolerance Patient tolerated treatment well   Behavior During Therapy Anxious;WFL for tasks assessed/performed      Past Medical History  Diagnosis Date  . Hyperlipidemia   . Cystitis 2007    sepsis post bladder biopsy, Dr Jeffie Pollock  . Headache(784.0) 09-13-11    less frequent to none at present  . TMJ click [id number]    right side > left  . Cataracts, both eyes 09-13-11    not surgical yet  . Hypertension 09-13-11    tx. meds  . 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 GERD (gastroesophageal reflux disease) 09-14-11    tx. Nexium  . Fibromyalgia 09-14-11    Dr Donney Dice, [facility]  . Arthritis 09-14-11    osteoarthritis, osteopenia  . Strep throat 09-14-11    none in a yr.  . Fractures 09-14-11    toes-left foot  . Edema 09-14-11    occ. ankles/ feet > left leg  . Heart murmur 09-13-11  . Anxiety   . History of knee replacement 09/21/2011    left  . 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 H/O foot surgery 12/19/2011    Past Surgical History  Procedure Laterality Date  . Tonsillectomy    . Sigmoidoscopy    . Colonoscopy  2011    negative  . Sling procedure  2006    a and p repair  . Nasal sinus surgery  09-14-11    right side " sinus lift"  . Total knee arthroplasty  09/21/2011    Procedure: TOTAL KNEE ARTHROPLASTY;  Surgeon: Gearlean Alf;   [facility];  Service: Orthopedics;  Laterality: Left;  . Foot arthrodesis  01/03/12    [facility]-- left hallux MP joint arthrodesis  . Hammer toe surgery  01/03/12    [facility], Dr Doran Durand  . Abdominal hysterectomy  1977    dysfunctional menses; age 66  . Appendectomy  1963  . Cystostomy w/ bladder biopsy  04-2006  . Total knee arthroplasty  06/25/2012    Procedure: TOTAL KNEE ARTHROPLASTY;  Surgeon: Gearlean Alf, MD;  [facility];  Service: Orthopedics;  Laterality: Right;    There were no vitals filed for this visit.  Visit Diagnosis:  Left shoulder pain  Neck pain  Shoulder stiffness, left      Subjective Assessment - 08/20/15 1622    Subjective Patient reports that she was late today as she lost track of time due to her mother passing away yesterday.   Currently in Pain? Yes   Pain Score 6    Pain Location Shoulder   Pain Orientation Left;Upper   Pain Descriptors / Indicators Aching;Spasm;Tightness   Pain Type Chronic pain   Aggravating Factors  stress  [facility] Adult PT Treatment/Exercise - 08/20/15 0001    Moist Heat Therapy   Number Minutes Moist Heat 15 Minutes   Moist Heat Location Cervical   Electrical Stimulation   Electrical Stimulation Location left upper trap   Electrical Stimulation Action IFC   Electrical Stimulation Parameters tolerance   Electrical Stimulation Goals Pain   Manual Therapy   Manual Therapy Soft tissue mobilization;Neural Stretch   Manual therapy comments occipital release   Soft tissue mobilization to left upper trap, rhomboid and c-p-spinals   Neural Stretch left UE                  PT Short Term Goals - 07/07/15 1444    PT SHORT TERM GOAL #1   Title independent with initial HEP   Status Achieved           PT Long Term Goals - 08/20/15 1659    PT LONG TERM GOAL #2   Title decrease pain 50%   Status On-going               Plan - 08/20/15 1658     Clinical Impression Statement Patient with much increased stress with more spasms and trigger points in the upper trap, cervical area and into the rhomboids, due to her mother's passing   PT Next Visit Plan will try to add exercise   Consulted and Agree with Plan of Care Patient        Problem List Patient Active Problem List   Diagnosis Date Noted  . OA (osteoarthritis) of knee 06/25/2012  . 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 Osteopenia 03/05/2008  . Hyperlipidemia 12/21/2007  . Essential hypertension 12/21/2007  . GERD 12/21/2007  . CONSTIPATION, CHRONIC 12/21/2007  . FIBROMYALGIA 12/21/2007  . Memory loss 12/21/2007    Sumner Boast., PT 08/20/2015, 5:00 PM  [address] [place], [place], [address] Phone: [phone]   Fax:  [phone]  Name: Madeline [name] MRN: [id number] Date of Birth: Mar 22, 1946

## 2015-08-24 ENCOUNTER — Ambulatory Visit: Payer: Medicare Other | Admitting: Physical Therapy

## 2015-08-24 ENCOUNTER — Encounter: Payer: Self-pay | Admitting: Physical Therapy

## 2015-08-24 DIAGNOSIS — M25512 Pain in left shoulder: Secondary | ICD-10-CM

## 2015-08-24 DIAGNOSIS — M25612 Stiffness of left shoulder, not elsewhere classified: Secondary | ICD-10-CM

## 2015-08-24 DIAGNOSIS — M542 Cervicalgia: Secondary | ICD-10-CM

## 2015-08-24 NOTE — Therapy (Signed)
[place], [place], [address] Phone: [phone]   Fax:  [phone]  Physical Therapy Treatment  Patient Details  Name: Madeline [name] MRN: [id number] Date of Birth: 1945/11/03 No Data Recorded  Encounter Date: 08/24/2015      PT End of Session - 08/24/15 1007    Visit Number 15   Date for PT Re-Evaluation 08/25/15   PT Start Time 0930   PT Stop Time 1018   PT Time Calculation (min) 48 min   Behavior During Therapy Anxious;WFL for tasks assessed/performed      Past Medical History  Diagnosis Date  . Hyperlipidemia   . Cystitis 2007    sepsis post bladder biopsy, Dr Jeffie Pollock  . Headache(784.0) 09-13-11    less frequent to none at present  . TMJ click [id number]    right side > left  . Cataracts, both eyes 09-13-11    not surgical yet  . Hypertension 09-13-11    tx. meds  . 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 GERD (gastroesophageal reflux disease) 09-14-11    tx. Nexium  . Fibromyalgia 09-14-11    Dr Donney Dice, [facility]  . Arthritis 09-14-11    osteoarthritis, osteopenia  . Strep throat 09-14-11    none in a yr.  . Fractures 09-14-11    toes-left foot  . Edema 09-14-11    occ. ankles/ feet > left leg  . Heart murmur 09-13-11  . Anxiety   . History of knee replacement 09/21/2011    left  . 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 H/O foot surgery 12/19/2011    Past Surgical History  Procedure Laterality Date  . Tonsillectomy    . Sigmoidoscopy    . Colonoscopy  2011    negative  . Sling procedure  2006    a and p repair  . Nasal sinus surgery  09-14-11    right side " sinus lift"  . Total knee arthroplasty  09/21/2011    Procedure: TOTAL KNEE ARTHROPLASTY;  Surgeon: Gearlean Alf;  [facility];  Service: Orthopedics;  Laterality: Left;  . Foot arthrodesis  01/03/12     ortho-- left  hallux MP joint arthrodesis  . Hammer toe surgery  01/03/12    [facility], Dr Doran Durand  . Abdominal hysterectomy  1977    dysfunctional menses; age 65  . Appendectomy  1963  . Cystostomy w/ bladder biopsy  04-2006  . Total knee arthroplasty  06/25/2012    Procedure: TOTAL KNEE ARTHROPLASTY;  Surgeon: Gearlean Alf, MD;  [facility];  Service: Orthopedics;  Laterality: Right;    There were no vitals filed for this visit.  Visit Diagnosis:  Left shoulder pain  Neck pain  Shoulder stiffness, left      Subjective Assessment - 08/24/15 1004    Subjective Patient reports that she has been under a lot of stress with planning the funeral of her mother.  Reports some increased pain.   Currently in Pain? Yes   Pain Score 5    Pain Location Shoulder   Pain Orientation Left;Posterior   Pain Descriptors / Indicators Aching;Tightness;Spasm   Pain Type Chronic pain   Aggravating Factors  stress   Pain Relieving Factors treatment  [place] Adult PT Treatment/Exercise - 08/24/15 0001    Moist Heat Therapy   Number Minutes Moist Heat 15 Minutes   Moist Heat Location Cervical   Electrical Stimulation   Electrical Stimulation Location left upper trap   Electrical Stimulation Action IFC   Electrical Stimulation Parameters tolerance   Electrical Stimulation Goals Pain   Manual Therapy   Manual Therapy Soft tissue mobilization;Neural Stretch   Manual therapy comments occipital release   Soft tissue mobilization to left upper trap, rhomboid and c-p-spinals   Neural Stretch left UE, some PROM of the left shoulder                  PT Short Term Goals - 07/07/15 1444    PT SHORT TERM GOAL #1   Title independent with initial HEP   Status Achieved           PT Long Term Goals - 08/20/15 1659    PT LONG TERM GOAL #2   Title decrease pain 50%   Status On-going               Plan - 08/24/15 1008    Clinical Impression  Statement Patient with increased spasms and tightness in the left upper trap and shoulder with death of her mother.  She has some numbness in the fingers with PROM of the left shoulder into flexion   PT Next Visit Plan will try to add exercise   Consulted and Agree with Plan of Care Patient        Problem List Patient Active Problem List   Diagnosis Date Noted  . OA (osteoarthritis) of knee 06/25/2012  . 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 Osteopenia 03/05/2008  . Hyperlipidemia 12/21/2007  . Essential hypertension 12/21/2007  . GERD 12/21/2007  . CONSTIPATION, CHRONIC 12/21/2007  . FIBROMYALGIA 12/21/2007  . Memory loss 12/21/2007    Sumner Boast., PT 08/24/2015, 10:09 AM  [address] [place], [place], [address] Phone: [phone]   Fax:  [phone]  Name: Madeline [name] MRN: [id number] Date of Birth: 1946/07/31

## 2015-08-28 ENCOUNTER — Ambulatory Visit: Payer: Medicare Other | Admitting: Physical Therapy

## 2015-08-28 ENCOUNTER — Encounter: Payer: Self-pay | Admitting: Physical Therapy

## 2015-08-28 DIAGNOSIS — M25512 Pain in left shoulder: Secondary | ICD-10-CM | POA: Diagnosis not present

## 2015-08-28 DIAGNOSIS — M25612 Stiffness of left shoulder, not elsewhere classified: Secondary | ICD-10-CM

## 2015-08-28 DIAGNOSIS — M542 Cervicalgia: Secondary | ICD-10-CM

## 2015-08-28 NOTE — Therapy (Signed)
[facility], [place], [address] Phone: [phone]   Fax:  [phone]  Physical Therapy Treatment  Patient Details  Name: Madeline [name] MRN: [id number] Date of Birth: 1946-03-23 No Data Recorded  Encounter Date: 08/28/2015      PT End of Session - 08/28/15 0926    Visit Number 16   Date for PT Re-Evaluation 09/25/15   PT Start Time 0842   PT Stop Time 0941   PT Time Calculation (min) 59 min   Activity Tolerance Patient tolerated treatment well   Behavior During Therapy Anxious;WFL for tasks assessed/performed      Past Medical History  Diagnosis Date  . Hyperlipidemia   . Cystitis 2007    sepsis post bladder biopsy, Dr Jeffie Pollock  . Headache(784.0) 09-13-11    less frequent to none at present  . TMJ click [id number]    right side > left  . Cataracts, both eyes 09-13-11    not surgical yet  . Hypertension 09-13-11    tx. meds  . 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 GERD (gastroesophageal reflux disease) 09-14-11    tx. Nexium  . Fibromyalgia 09-14-11    Dr Donney Dice, [facility]  . Arthritis 09-14-11    osteoarthritis, osteopenia  . Strep throat 09-14-11    none in a yr.  . Fractures 09-14-11    toes-left foot  . Edema 09-14-11    occ. ankles/ feet > left leg  . Heart murmur 09-13-11  . Anxiety   . History of knee replacement 09/21/2011    left  . 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 H/O foot surgery 12/19/2011    Past Surgical History  Procedure Laterality Date  . Tonsillectomy    . Sigmoidoscopy    . Colonoscopy  2011    negative  . Sling procedure  2006    a and p repair  . Nasal sinus surgery  09-14-11    right side " sinus lift"  . Total knee arthroplasty  09/21/2011    Procedure: TOTAL KNEE ARTHROPLASTY;  Surgeon: Gearlean Alf;  [facility]: [facility];  Service: Orthopedics;  Laterality: Left;  .  Foot arthrodesis  01/03/12    [place] [facility]-- left hallux MP joint arthrodesis  . Hammer toe surgery  01/03/12    [facility], Dr Doran Durand  . Abdominal hysterectomy  1977    dysfunctional menses; age 63  . Appendectomy  1963  . Cystostomy w/ bladder biopsy  04-2006  . Total knee arthroplasty  06/25/2012    Procedure: TOTAL KNEE ARTHROPLASTY;  Surgeon: Gearlean Alf, MD;  [facility];  Service: Orthopedics;  Laterality: Right;    There were no vitals filed for this visit.  Visit Diagnosis:  Left shoulder pain - Plan: PT plan of care cert/re-cert  Neck pain - Plan: PT plan of care cert/re-cert  Shoulder stiffness, left - Plan: PT plan of care cert/re-cert      Subjective Assessment - 08/28/15 0850    Subjective My left shoulder is still hurting, probably all the things I have had to be doing and the stress   Currently in Pain? Yes   Pain Score 6    Pain Location Shoulder   Pain Orientation Left;Anterior;Posterior   Pain Descriptors / Indicators Aching;Tightness;Spasm   Pain Type Chronic pain   Pain Onset More than  a month ago                         [facility] Adult PT Treatment/Exercise - 08/28/15 0001    Neck Exercises: Standing   Other Standing Exercises scapular stabilization red tband 3 ways, W and X to Y exercises   Other Standing Exercises 5# shrugs and upper trap and levator stretches, NuStep Level 5 x 5 minutes, 4 # biceps, 2# bent over rows   Shoulder Exercises: ROM/Strengthening   UBE (Upper Arm Bike) 2 fwd/2back  L 1.2    Cybex Row Limitations 15# x10 then had pain in the left shoulder   "W" Arms 15 reps   Other ROM/Strengthening Exercises red tband scapular stabilization and ER   Moist Heat Therapy   Number Minutes Moist Heat 15 Minutes   Moist Heat Location Cervical   Electrical Stimulation   Electrical Stimulation Location left upper trap   Electrical Stimulation Action IFC   Electrical Stimulation Parameters tolerance   Electrical  Stimulation Goals Pain   Manual Therapy   Manual Therapy Soft tissue mobilization;Neural Stretch   Manual therapy comments occipital release   Soft tissue mobilization to left upper trap, rhomboid and c-p-spinals   Neural Stretch left UE, some PROM of the left shoulder                  PT Short Term Goals - 08/28/15 0929    PT SHORT TERM GOAL #1   Title independent with initial HEP   Status Achieved           PT Long Term Goals - 08/28/15 0929    PT LONG TERM GOAL #1   Title understand proper posture and body mechanics   Status Achieved   PT LONG TERM GOAL #2   Title decrease pain 50%   Status Partially Met   PT LONG TERM GOAL #3   Title increase left shoulder AROM to 150 degrees flexion   Status Partially Met   PT LONG TERM GOAL #4   Title increase left shoulder AROM of IR to 65 degrees   Status Partially Met   PT LONG TERM GOAL #5   Title dress and do hair without difficulty   Status Partially Met               Plan - 08/28/15 0926    Clinical Impression Statement Patient is having some increased pain in the left upper trap and in the left shoulder, her mom passed away this week.  She has some limited ROM of the left shoulder.  She has a large knot in the upper trap and some knots in the posterior shoulder.   PT Frequency 2x / week   PT Duration 4 weeks   PT Next Visit Plan I will continue to add exercises and stretches to get her back to ADL's that are not painful        Problem List Patient Active Problem List   Diagnosis Date Noted  . OA (osteoarthritis) of knee 06/25/2012  . 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 Osteopenia 03/05/2008  . Hyperlipidemia 12/21/2007  . Essential hypertension 12/21/2007  . GERD 12/21/2007  . CONSTIPATION, CHRONIC 12/21/2007  . FIBROMYALGIA 12/21/2007  . Memory loss 12/21/2007    Jearld Lesch., PT 08/28/2015, 9:37 AM  [facility]  [place] [address] [place], [place], [address] Phone: [phone]   Fax:  [phone]  Name: Madeline [name] MRN: [id number] Date of Birth: 15-Apr-1946

## 2015-09-01 ENCOUNTER — Encounter: Payer: Self-pay | Admitting: Physical Therapy

## 2015-09-01 ENCOUNTER — Ambulatory Visit: Payer: Medicare Other | Admitting: Physical Therapy

## 2015-09-01 DIAGNOSIS — M25512 Pain in left shoulder: Secondary | ICD-10-CM | POA: Diagnosis not present

## 2015-09-01 DIAGNOSIS — M542 Cervicalgia: Secondary | ICD-10-CM

## 2015-09-01 DIAGNOSIS — M25612 Stiffness of left shoulder, not elsewhere classified: Secondary | ICD-10-CM

## 2015-09-01 NOTE — Therapy (Signed)
[place], [place], [address] Phone: [phone]   Fax:  [phone]  Physical Therapy Treatment  Patient Details  Name: Madeline [name] MRN: [id number] Date of Birth: 1945/11/28 No Data Recorded  Encounter Date: 09/01/2015      PT End of Session - 09/01/15 1008    Visit Number 17   Date for PT Re-Evaluation 09/25/15   PT Start Time 0928   PT Stop Time 1018   PT Time Calculation (min) 50 min   Activity Tolerance Patient tolerated treatment well   Behavior During Therapy [facility] for tasks assessed/performed      Past Medical History  Diagnosis Date  . Hyperlipidemia   . Cystitis 2007    sepsis post bladder biopsy, Dr Jeffie Pollock  . Headache(784.0) 09-13-11    less frequent to none at present  . TMJ click [id number]    right side > left  . Cataracts, both eyes 09-13-11    not surgical yet  . Hypertension 09-13-11    tx. meds  . 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 GERD (gastroesophageal reflux disease) 09-14-11    tx. Nexium  . Fibromyalgia 09-14-11    Dr Donney Dice, [facility]  . Arthritis 09-14-11    osteoarthritis, osteopenia  . Strep throat 09-14-11    none in a yr.  . Fractures 09-14-11    toes-left foot  . Edema 09-14-11    occ. ankles/ feet > left leg  . Heart murmur 09-13-11  . Anxiety   . History of knee replacement 09/21/2011    left  . 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 H/O foot surgery 12/19/2011    Past Surgical History  Procedure Laterality Date  . Tonsillectomy    . Sigmoidoscopy    . Colonoscopy  2011    negative  . Sling procedure  2006    a and p repair  . Nasal sinus surgery  09-14-11    right side " sinus lift"  . Total knee arthroplasty  09/21/2011    Procedure: TOTAL KNEE ARTHROPLASTY;  Surgeon: Gearlean Alf;  [facility];  Service: Orthopedics;  Laterality: Left;  . Foot  arthrodesis  01/03/12    [place] [facility]-- left hallux MP joint arthrodesis  . Hammer toe surgery  01/03/12    [facility], Dr Doran Durand  . Abdominal hysterectomy  1977    dysfunctional menses; age 69  . Appendectomy  1963  . Cystostomy w/ bladder biopsy  04-2006  . Total knee arthroplasty  06/25/2012    Procedure: TOTAL KNEE ARTHROPLASTY;  Surgeon: Gearlean Alf, MD;  [facility];  Service: Orthopedics;  Laterality: Right;    There were no vitals filed for this visit.  Visit Diagnosis:  Left shoulder pain  Neck pain  Shoulder stiffness, left      Subjective Assessment - 09/01/15 1001    Subjective I feel better when I leave here, my arm moves better, I just think with all of the stress that I have had, I can't get it to last more than a day.   Currently in Pain? Yes   Pain Score 4    Pain Location Shoulder   Pain Orientation Left;Anterior;Posterior                         [facility] Adult  PT Treatment/Exercise - 09/01/15 0001    Moist Heat Therapy   Number Minutes Moist Heat 15 Minutes   Moist Heat Location Cervical   Electrical Stimulation   Electrical Stimulation Location left upper trap   Electrical Stimulation Action IFC   Electrical Stimulation Parameters tolerance   Electrical Stimulation Goals Pain   Manual Therapy   Manual Therapy Soft tissue mobilization;Neural Stretch   Manual therapy comments occipital release   Soft tissue mobilization to left upper trap, rhomboid and c-p-spinals   Neural Stretch left UE, some PROM of the left shoulder                  PT Short Term Goals - 08/28/15 0929    PT SHORT TERM GOAL #1   Title independent with initial HEP   Status Achieved           PT Long Term Goals - 08/28/15 0929    PT LONG TERM GOAL #1   Title understand proper posture and body mechanics   Status Achieved   PT LONG TERM GOAL #2   Title decrease pain 50%   Status Partially Met   PT LONG TERM GOAL #3   Title increase left  shoulder AROM to 150 degrees flexion   Status Partially Met   PT LONG TERM GOAL #4   Title increase left shoulder AROM of IR to 65 degrees   Status Partially Met   PT LONG TERM GOAL #5   Title dress and do hair without difficulty   Status Partially Met               Plan - 09/01/15 1008    Clinical Impression Statement Patient still very tight and painful in the left sholder with PROM,  Her cervical ROM is improving, the knots in the upper traps and in the cervical area are smaller but still present   PT Next Visit Plan I will continue to add exercises and stretches to get her back to ADL's that are not painful   Consulted and Agree with Plan of Care Patient        Problem List Patient Active Problem List   Diagnosis Date Noted  . OA (osteoarthritis) of knee 06/25/2012  . 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 Osteopenia 03/05/2008  . Hyperlipidemia 12/21/2007  . Essential hypertension 12/21/2007  . GERD 12/21/2007  . CONSTIPATION, CHRONIC 12/21/2007  . FIBROMYALGIA 12/21/2007  . Memory loss 12/21/2007    Sumner Boast., PT 09/01/2015, 10:10 AM  [facility] [place] [address] [place], [place], [address] Phone: [phone]   Fax:  [phone]  Name: Madeline [name] MRN: [id number] Date of Birth: 08-07-1946

## 2015-09-04 ENCOUNTER — Encounter: Payer: Self-pay | Admitting: Physical Therapy

## 2015-09-04 ENCOUNTER — Ambulatory Visit: Payer: Medicare Other | Admitting: Physical Therapy

## 2015-09-04 DIAGNOSIS — M542 Cervicalgia: Secondary | ICD-10-CM

## 2015-09-04 DIAGNOSIS — M25512 Pain in left shoulder: Secondary | ICD-10-CM

## 2015-09-04 DIAGNOSIS — M25612 Stiffness of left shoulder, not elsewhere classified: Secondary | ICD-10-CM

## 2015-09-04 NOTE — Therapy (Signed)
[place], [place], [address] Phone: [phone]   Fax:  [phone]  Physical Therapy Treatment  Patient Details  Name: Madeline [name] MRN: [id number] Date of Birth: [birth year] No Data Recorded  Encounter Date: 09/04/2015      PT End of Session - 09/04/15 1136    Visit Number 18   Date for PT Re-Evaluation 09/25/15   PT Start Time 0931   PT Stop Time 1019   PT Time Calculation (min) 48 min   Activity Tolerance Patient tolerated treatment well   Behavior During Therapy [facility] for tasks assessed/performed      Past Medical History  Diagnosis Date  . Hyperlipidemia   . Cystitis 2007    sepsis post bladder biopsy, Dr Jeffie Pollock  . Headache(784.0) 09-13-11    less frequent to none at present  . TMJ click [id number]    right side > left  . Cataracts, both eyes 09-13-11    not surgical yet  . Hypertension 09-13-11    tx. meds  . 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 GERD (gastroesophageal reflux disease) 09-14-11    tx. Nexium  . Fibromyalgia 09-14-11    Dr Donney Dice, [facility]  . Arthritis 09-14-11    osteoarthritis, osteopenia  . Strep throat 09-14-11    none in a yr.  . Fractures 09-14-11    toes-left foot  . Edema 09-14-11    occ. ankles/ feet > left leg  . Heart murmur 09-13-11  . Anxiety   . History of knee replacement 09/21/2011    left  . 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 H/O foot surgery 12/19/2011    Past Surgical History  Procedure Laterality Date  . Tonsillectomy    . Sigmoidoscopy    . Colonoscopy  2011    negative  . Sling procedure  2006    a and p repair  . Nasal sinus surgery  09-14-11    right side " sinus lift"  . Total knee arthroplasty  09/21/2011    Procedure: TOTAL KNEE ARTHROPLASTY;  Surgeon: Gearlean Alf;  [facility];  Service: Orthopedics;  Laterality: Left;  . Foot  arthrodesis  01/03/12    [place] [facility]-- left hallux MP joint arthrodesis  . Hammer toe surgery  01/03/12    [facility], Dr Doran Durand  . Abdominal hysterectomy  1977    dysfunctional menses; age 12  . Appendectomy  1963  . Cystostomy w/ bladder biopsy  04-2006  . Total knee arthroplasty  06/25/2012    Procedure: TOTAL KNEE ARTHROPLASTY;  Surgeon: Gearlean Alf, MD;  [facility];  Service: Orthopedics;  Laterality: Right;    There were no vitals filed for this visit.  Visit Diagnosis:  Left shoulder pain  Neck pain  Shoulder stiffness, left      Subjective Assessment - 09/04/15 1133    Subjective Patient continues to get good relief when she leaves but the carry over is shorter that I would like, she hsa been under a lot of stress with the holidays and her mother's passing 2 weeks ago that seems to have exacerbated her pain and the trigger points.   Currently in Pain? Yes   Pain Score 4    Pain Location Shoulder   Pain Orientation Left;Anterior;Posterior   Pain Descriptors / Indicators Aching;Spasm;Tightness   Pain Type  Chronic pain   Pain Onset More than a month ago   Pain Frequency Constant   Aggravating Factors  stress, decorating for christmas   Pain Relieving Factors the treatment here helps            [facility] PT Assessment - 09/04/15 0001    AROM   Left Shoulder Flexion 140 Degrees   Left Shoulder ABduction 130 Degrees   Left Shoulder Internal Rotation 35 Degrees   Left Shoulder External Rotation 70 Degrees                     [facility] Adult PT Treatment/Exercise - 09/04/15 0001    Moist Heat Therapy   Number Minutes Moist Heat 15 Minutes   Moist Heat Location Cervical   Electrical Stimulation   Electrical Stimulation Location left upper trap   Electrical Stimulation Action IFC   Electrical Stimulation Parameters tolerance   Electrical Stimulation Goals Pain   Manual Therapy   Manual Therapy Soft tissue mobilization;Neural Stretch   Manual  therapy comments occipital release   Soft tissue mobilization to left upper trap, rhomboid and c-p-spinals   Neural Stretch left UE, some PROM of the left shoulder                  PT Short Term Goals - 08/28/15 0929    PT SHORT TERM GOAL #1   Title independent with initial HEP   Status Achieved           PT Long Term Goals - 09/04/15 1141    PT LONG TERM GOAL #1   Title understand proper posture and body mechanics   Status Achieved   PT LONG TERM GOAL #2   Title decrease pain 50%   Status On-going   PT LONG TERM GOAL #3   Title increase left shoulder AROM to 150 degrees flexion   Status Partially Met   PT LONG TERM GOAL #4   Title increase left shoulder AROM of IR to 65 degrees   Status On-going   PT LONG TERM GOAL #5   Title dress and do hair without difficulty   Status On-going               Plan - 09/04/15 1138    Clinical Impression Statement We have seen Ms. Wollard a number of times to address the pain, decreased ROM of the shoulder and to work on the knots/upper traps in the left upper trap and the teres areas.  She gets very good relief but the carry over has only been for a few days, she has had increased stress with her mother's illness and then her passing, this has caused some incresaed pain and spasms without question.   PT Next Visit Plan I have scheduled her an appointment with Leonette Most, PT, [facility] that will try dry needling on her to see if we can increase the carry over, she is scheduled with him next week and then will probably return here to [place] until he can get her in again if it helps.   Consulted and Agree with Plan of Care Patient        Problem List Patient Active Problem List   Diagnosis Date Noted  . OA (osteoarthritis) of knee 06/25/2012  . 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 Osteopenia 03/05/2008  . Hyperlipidemia 12/21/2007  . Essential hypertension 12/21/2007  . GERD 12/21/2007  . CONSTIPATION,  CHRONIC 12/21/2007  . FIBROMYALGIA 12/21/2007  . Memory  loss 12/21/2007    Sumner Boast., PT 09/04/2015, 11:42 AM  [place] [address] [place], [place], [address] Phone: [phone]   Fax:  [phone]  Name: Madeline [name] MRN: [id number] Date of Birth: 1945-12-12

## 2015-09-08 ENCOUNTER — Ambulatory Visit: Payer: Medicare Other | Admitting: Physical Therapy

## 2015-09-08 DIAGNOSIS — M542 Cervicalgia: Secondary | ICD-10-CM

## 2015-09-08 DIAGNOSIS — M25512 Pain in left shoulder: Secondary | ICD-10-CM

## 2015-09-08 NOTE — Therapy (Signed)
[facility] [address]  [place], [place], [address] Phone: [phone]   Fax:  [phone]  Physical Therapy Treatment  Patient Details  Name: Madeline [name] MRN: [id number] Date of Birth: 06/28/1946 No Data Recorded  Encounter Date: 09/08/2015      PT End of Session - 09/08/15 1054    Visit Number 19   Date for PT Re-Evaluation 09/25/15   PT Start Time 1018   PT Stop Time 1053   PT Time Calculation (min) 35 min      Past Medical History  Diagnosis Date  . Hyperlipidemia   . Cystitis 2007    sepsis post bladder biopsy, Dr Jeffie Pollock  . Headache(784.0) 09-13-11    less frequent to none at present  . TMJ click 03-47-42    right side > left  . Cataracts, both eyes 09-13-11    not surgical yet  . Hypertension 09-13-11    tx. meds  . 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 GERD (gastroesophageal reflux disease) 09-14-11    tx. Nexium  . Fibromyalgia 09-14-11    Dr Donney Dice, [facility]  . Arthritis 09-14-11    osteoarthritis, osteopenia  . Strep throat 09-14-11    none in a yr.  . Fractures 09-14-11    toes-left foot  . Edema 09-14-11    occ. ankles/ feet > left leg  . Heart murmur 09-13-11  . Anxiety   . History of knee replacement 09/21/2011    left  . 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 H/O foot surgery 12/19/2011    Past Surgical History  Procedure Laterality Date  . Tonsillectomy    . Sigmoidoscopy    . Colonoscopy  2011    negative  . Sling procedure  2006    a and p repair  . Nasal sinus surgery  09-14-11    right side " sinus lift"  . Total knee arthroplasty  09/21/2011    Procedure: TOTAL KNEE ARTHROPLASTY;  Surgeon: Gearlean Alf;  [facility];  Service: Orthopedics;  Laterality: Left;  . Foot arthrodesis  01/03/12     ortho-- left hallux MP joint arthrodesis  . Hammer toe surgery  01/03/12   [facility], Dr Doran Durand  . Abdominal hysterectomy  1977    dysfunctional menses; age 59  . Appendectomy  1963  . Cystostomy w/ bladder biopsy  04-2006  . Total knee arthroplasty  06/25/2012    Procedure: TOTAL KNEE ARTHROPLASTY;  Surgeon: Gearlean Alf, MD;  [facility];  Service: Orthopedics;  Laterality: Right;    There were no vitals filed for this visit.  Visit Diagnosis:  Left shoulder pain  Neck pain      Subjective Assessment - 09/08/15 1021    Subjective Pt states her pain is okay right now but she states her pain typically increases throughout the day.  Pain chiefly noted L posterior scapular muscles (mid aspect today, upper aspect other days).  Frequently notes n/t into #4-5 digits on L.   Currently in Pain? Yes   Pain Score 1   1/10 so far this AM; but states pain increases throughout the day to 4-5/10   Pain Location Shoulder   Pain Orientation Left             Trigger Point Dry Needling - 09/08/15 1309    Consent Given?  Yes   Muscles Treated Upper Body Upper trapezius;Infraspinatus   Upper Trapezius Response Palpable increased muscle length   Infraspinatus Response Twitch response elicited             PT Short Term Goals - 08/28/15 0929    PT SHORT TERM GOAL #1   Title independent with initial HEP   Status Achieved           PT Long Term Goals - 09/04/15 1141    PT LONG TERM GOAL #1   Title understand proper posture and body mechanics   Status Achieved   PT LONG TERM GOAL #2   Title decrease pain 50%   Status On-going   PT LONG TERM GOAL #3   Title increase left shoulder AROM to 150 degrees flexion   Status Partially Met   PT LONG TERM GOAL #4   Title increase left shoulder AROM of IR to 65 degrees   Status On-going   PT LONG TERM GOAL #5   Title dress and do hair without difficulty   Status On-going               Plan - 09/08/15 1301    Clinical Impression Statement pt sent to me for dry needling treatment due to left  shoulder/scapular pain.  I reviewed treatment option with pt and assessed her symptoms.  She was an appropriate candidate for treatment modality and she provided informed consent to perform dry needling.  This was performed throughout her L upper trapezius and infraspinatus.  Good twitch response noted in lateral infraspinatus and good decrease in tissue density noted in upper trapezius.  Kinesiotape also applied today to same area to further assist with symptom control.   PT Next Visit Plan question pt about benefit of needling and/or taping.  She can return to this clinic PRN for needling.   Consulted and Agree with Plan of Care Patient        Problem List Patient Active Problem List   Diagnosis Date Noted  . OA (osteoarthritis) of knee 06/25/2012  . 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 Osteopenia 03/05/2008  . Hyperlipidemia 12/21/2007  . Essential hypertension 12/21/2007  . GERD 12/21/2007  . CONSTIPATION, CHRONIC 12/21/2007  . FIBROMYALGIA 12/21/2007  . Memory loss 12/21/2007    Alexsandro Salek PT, O[facility] 09/08/2015, 1:10 PM  [facility] [address]  [place], [place], [address] Phone: [phone]   Fax:  [phone]  Name: Madeline [name] MRN: [id number] Date of Birth: 1946-02-03

## 2015-09-11 ENCOUNTER — Ambulatory Visit: Payer: Medicare Other | Admitting: Physical Therapy

## 2015-09-11 ENCOUNTER — Encounter: Payer: Self-pay | Admitting: Physical Therapy

## 2015-09-11 DIAGNOSIS — M542 Cervicalgia: Secondary | ICD-10-CM

## 2015-09-11 DIAGNOSIS — M25512 Pain in left shoulder: Secondary | ICD-10-CM

## 2015-09-11 DIAGNOSIS — M25612 Stiffness of left shoulder, not elsewhere classified: Secondary | ICD-10-CM

## 2015-09-11 NOTE — Therapy (Signed)
[place], [place], [address] Phone: [phone]   Fax:  [phone]  Physical Therapy Treatment  Patient Details  Name: Madeline [name] MRN: [id number] Date of Birth: July 23, 1946 No Data Recorded  Encounter Date: 09/11/2015      PT End of Session - 09/11/15 1012    Visit Number 20   Date for PT Re-Evaluation 09/25/15   PT Start Time 0927   PT Stop Time 1025   PT Time Calculation (min) 58 min   Activity Tolerance Patient tolerated treatment well   Behavior During Therapy [facility] for tasks assessed/performed      Past Medical History  Diagnosis Date  . Hyperlipidemia   . Cystitis 2007    sepsis post bladder biopsy, Dr Jeffie Pollock  . Headache(784.0) 09-13-11    less frequent to none at present  . TMJ click [id number]    right side > left  . Cataracts, both eyes 09-13-11    not surgical yet  . Hypertension 09-13-11    tx. meds  . 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 GERD (gastroesophageal reflux disease) 09-14-11    tx. Nexium  . Fibromyalgia 09-14-11    Dr Donney Dice, [facility]  . Arthritis 09-14-11    osteoarthritis, osteopenia  . Strep throat 09-14-11    none in a yr.  . Fractures 09-14-11    toes-left foot  . Edema 09-14-11    occ. ankles/ feet > left leg  . Heart murmur 09-13-11  . Anxiety   . History of knee replacement 09/21/2011    left  . 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 H/O foot surgery 12/19/2011    Past Surgical History  Procedure Laterality Date  . Tonsillectomy    . Sigmoidoscopy    . Colonoscopy  2011    negative  . Sling procedure  2006    a and p repair  . Nasal sinus surgery  09-14-11    right side " sinus lift"  . Total knee arthroplasty  09/21/2011    Procedure: TOTAL KNEE ARTHROPLASTY;  Surgeon: Gearlean Alf;  [facility];  Service: Orthopedics;  Laterality: Left;  . Foot  arthrodesis  01/03/12    [facility]-- left hallux MP joint arthrodesis  . Hammer toe surgery  01/03/12    [facility], Dr Doran Durand  . Abdominal hysterectomy  1977    dysfunctional menses; age 61  . Appendectomy  1963  . Cystostomy w/ bladder biopsy  04-2006  . Total knee arthroplasty  06/25/2012    Procedure: TOTAL KNEE ARTHROPLASTY;  Surgeon: Gearlean Alf, MD;  [facility];  Service: Orthopedics;  Laterality: Right;    There were no vitals filed for this visit.  Visit Diagnosis:  Left shoulder pain  Neck pain  Shoulder stiffness, left      Subjective Assessment - 09/11/15 0926    Subjective I think the needling and the tape have helped.  Reports that she took the tape off last night due to itching.   Currently in Pain? Yes   Pain Score 2    Pain Location Shoulder   Pain Orientation Left;Upper;Posterior                         [facility] Adult PT Treatment/Exercise - 09/11/15 0001    Moist Heat Therapy  Number Minutes Moist Heat 15 Minutes   Moist Heat Location Cervical   Electrical Stimulation   Electrical Stimulation Location left upper trap   Electrical Stimulation Action IFC   Electrical Stimulation Parameters tolerance   Electrical Stimulation Goals Pain   Manual Therapy   Manual Therapy Soft tissue mobilization;Neural Stretch   Manual therapy comments occipital release   Soft tissue mobilization to left upper trap, rhomboid and c-p-spinals   Neural Stretch left UE, some PROM of the left shoulder   Kinesiotex Inhibit Muscle   Kinesiotix   Inhibit Muscle  upper trap and teres                  PT Short Term Goals - 08/28/15 0929    PT SHORT TERM GOAL #1   Title independent with initial HEP   Status Achieved           PT Long Term Goals - 2015/10/04 1040    PT LONG TERM GOAL #1   Title understand proper posture and body mechanics   Status Achieved   PT LONG TERM GOAL #2   Title decrease pain 50%   Status On-going   PT  LONG TERM GOAL #3   Title increase left shoulder AROM to 150 degrees flexion   Status Partially Met   PT LONG TERM GOAL #4   Title increase left shoulder AROM of IR to 65 degrees   Status Partially Met   PT LONG TERM GOAL #5   Title dress and do hair without difficulty   Status Partially Met               Plan - October 04, 2015 1021    Clinical Impression Statement Patient feels that the needling and the tape helped.  We will work on the [employer] and did tape today, she has significant knots and spasms still.     PT Next Visit Plan We will see about the taping and possibly reschedule for dyr needling in the future   Consulted and Agree with Plan of Care Patient          G-Codes - 10-04-2015 1040    Functional Assessment Tool Used foto   Functional Limitation Other PT primary   Other PT Primary Current Status (U6333) At least 40 percent but less than 60 percent impaired, limited or restricted   Other PT Primary Goal Status (L4562) At least 40 percent but less than 60 percent impaired, limited or restricted      Problem List Patient Active Problem List   Diagnosis Date Noted  . OA (osteoarthritis) of knee 06/25/2012  . 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 Osteopenia 03/05/2008  . Hyperlipidemia 12/21/2007  . Essential hypertension 12/21/2007  . GERD 12/21/2007  . CONSTIPATION, CHRONIC 12/21/2007  . FIBROMYALGIA 12/21/2007  . Memory loss 12/21/2007    Sumner Boast., PT October 04, 2015, 10:42 AM  [facility] [address] [place], [place], [address] Phone: [phone]   Fax:  [phone]  Name: Madeline [name] MRN: [id number] Date of Birth: 31-Jan-1946

## 2015-09-15 ENCOUNTER — Ambulatory Visit: Payer: Medicare Other | Admitting: Physical Therapy

## 2015-09-15 ENCOUNTER — Encounter: Payer: Self-pay | Admitting: Physical Therapy

## 2015-09-15 DIAGNOSIS — M25512 Pain in left shoulder: Secondary | ICD-10-CM

## 2015-09-15 DIAGNOSIS — M542 Cervicalgia: Secondary | ICD-10-CM

## 2015-09-15 DIAGNOSIS — M25612 Stiffness of left shoulder, not elsewhere classified: Secondary | ICD-10-CM

## 2015-09-15 NOTE — Therapy (Signed)
[place], [place], [address] Phone: [phone]   Fax:  [phone]  Physical Therapy Treatment  Patient Details  Name: Madeline [name] MRN: [id number] Date of Birth: [birth year] No Data Recorded  Encounter Date: 09/15/2015      PT End of Session - 09/15/15 1006    Visit Number 21   Date for PT Re-Evaluation 09/25/15   PT Start Time 0927   PT Stop Time 1016   PT Time Calculation (min) 49 min   Activity Tolerance Patient tolerated treatment well   Behavior During Therapy [facility] for tasks assessed/performed      Past Medical History  Diagnosis Date  . Hyperlipidemia   . Cystitis 2007    sepsis post bladder biopsy, Dr Jeffie Pollock  . Headache(784.0) 09-13-11    less frequent to none at present  . TMJ click [id number]    right side > left  . Cataracts, both eyes 09-13-11    not surgical yet  . Hypertension 09-13-11    tx. meds  . 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 GERD (gastroesophageal reflux disease) 09-14-11    tx. Nexium  . Fibromyalgia 09-14-11    Dr Donney Dice, [facility]  . Arthritis 09-14-11    osteoarthritis, osteopenia  . Strep throat 09-14-11    none in a yr.  . Fractures 09-14-11    toes-left foot  . Edema 09-14-11    occ. ankles/ feet > left leg  . Heart murmur 09-13-11  . Anxiety   . History of knee replacement 09/21/2011    left  . 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 H/O foot surgery 12/19/2011    Past Surgical History  Procedure Laterality Date  . Tonsillectomy    . Sigmoidoscopy    . Colonoscopy  2011    negative  . Sling procedure  2006    a and p repair  . Nasal sinus surgery  09-14-11    right side " sinus lift"  . Total knee arthroplasty  09/21/2011    Procedure: TOTAL KNEE ARTHROPLASTY;  Surgeon: Gearlean Alf;  [facility];  Service: Orthopedics;  Laterality: Left;  . Foot  arthrodesis  01/03/12    [facility] [facility]-- left hallux MP joint arthrodesis  . Hammer toe surgery  01/03/12    [facility], Dr Doran Durand  . Abdominal hysterectomy  1977    dysfunctional menses; age 60  . Appendectomy  1963  . Cystostomy w/ bladder biopsy  04-2006  . Total knee arthroplasty  06/25/2012    Procedure: TOTAL KNEE ARTHROPLASTY;  Surgeon: Gearlean Alf, MD;  [facility];  Service: Orthopedics;  Laterality: Right;    There were no vitals filed for this visit.  Visit Diagnosis:  Left shoulder pain  Neck pain  Shoulder stiffness, left      Subjective Assessment - 09/15/15 1000    Subjective Patient reports being happy with the tape.  Reports that she was able to do the Christmas stuff without much difficulty   Currently in Pain? Yes   Pain Score 3    Pain Location Shoulder   Pain Orientation Left;Upper                         [facility] Adult PT Treatment/Exercise - 09/15/15 0001    Moist Heat Therapy  Number Minutes Moist Heat 15 Minutes   Moist Heat Location Cervical   Electrical Stimulation   Electrical Stimulation Location left upper trap   Electrical Stimulation Action IFC   Electrical Stimulation Parameters tolerance   Electrical Stimulation Goals Pain   Manual Therapy   Manual Therapy Soft tissue mobilization;Neural Stretch   Manual therapy comments occipital release   Soft tissue mobilization to left upper trap, rhomboid and c-p-spinals   Neural Stretch left UE, some PROM of the left shoulder   Kinesiotex Inhibit Muscle   Kinesiotix   Inhibit Muscle  upper trap and teres                  PT Short Term Goals - 08/28/15 0929    PT SHORT TERM GOAL #1   Title independent with initial HEP   Status Achieved           PT Long Term Goals - 09/15/15 1008    PT LONG TERM GOAL #2   Title decrease pain 50%   Status On-going   PT LONG TERM GOAL #3   Title increase left shoulder AROM to 150 degrees flexion   Status On-going                Plan - 09/15/15 1007    Clinical Impression Statement Patient with reports that the taping really helped over the Christmas holiday.  She remains wiht significant knots in the left upper trap, rhomboid and cervical area   PT Next Visit Plan possible schedule for the dry needling again   Consulted and Agree with Plan of Care Patient        Problem List Patient Active Problem List   Diagnosis Date Noted  . OA (osteoarthritis) of knee 06/25/2012  . 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 Osteopenia 03/05/2008  . Hyperlipidemia 12/21/2007  . Essential hypertension 12/21/2007  . GERD 12/21/2007  . CONSTIPATION, CHRONIC 12/21/2007  . FIBROMYALGIA 12/21/2007  . Memory loss 12/21/2007    Madeline [name]., PT 09/15/2015, 10:09 AM  [place] [place], [place], [address] Phone: [phone]   Fax:  [phone]  Name: Madeline [name] MRN: [id number] Date of Birth: [birth year]

## 2015-09-18 ENCOUNTER — Encounter: Payer: Self-pay | Admitting: Physical Therapy

## 2015-09-18 ENCOUNTER — Ambulatory Visit: Payer: Medicare Other | Admitting: Physical Therapy

## 2015-09-18 DIAGNOSIS — M25512 Pain in left shoulder: Secondary | ICD-10-CM | POA: Diagnosis not present

## 2015-09-18 DIAGNOSIS — M25612 Stiffness of left shoulder, not elsewhere classified: Secondary | ICD-10-CM

## 2015-09-18 DIAGNOSIS — M542 Cervicalgia: Secondary | ICD-10-CM

## 2015-09-18 NOTE — Therapy (Signed)
[place], [place], [address] Phone: [phone]   Fax:  [phone]  Physical Therapy Treatment  Patient Details  Name: Madeline [name] MRN: [id number] Date of Birth: 03/19/1946 No Data Recorded  Encounter Date: 09/18/2015      PT End of Session - 09/18/15 1115    Visit Number 22   Date for PT Re-Evaluation 09/25/15   PT Start Time 1013   PT Stop Time 1108   PT Time Calculation (min) 55 min   Activity Tolerance Patient tolerated treatment well   Behavior During Therapy [facility] for tasks assessed/performed      Past Medical History  Diagnosis Date  . Hyperlipidemia   . Cystitis 2007    sepsis post bladder biopsy, Dr Jeffie Pollock  . Headache(784.0) 09-13-11    less frequent to none at present  . TMJ click [phone]    right side > left  . Cataracts, both eyes 09-13-11    not surgical yet  . Hypertension 09-13-11    tx. meds  . 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 GERD (gastroesophageal reflux disease) 09-14-11    tx. Nexium  . Fibromyalgia 09-14-11    Dr Donney Dice, [facility]  . Arthritis 09-14-11    osteoarthritis, osteopenia  . Strep throat 09-14-11    none in a yr.  . Fractures 09-14-11    toes-left foot  . Edema 09-14-11    occ. ankles/ feet > left leg  . Heart murmur 09-13-11  . Anxiety   . History of knee replacement 09/21/2011    left  . 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 H/O foot surgery 12/19/2011    Past Surgical History  Procedure Laterality Date  . Tonsillectomy    . Sigmoidoscopy    . Colonoscopy  2011    negative  . Sling procedure  2006    a and p repair  . Nasal sinus surgery  09-14-11    right side " sinus lift"  . Total knee arthroplasty  09/21/2011    Procedure: TOTAL KNEE ARTHROPLASTY;  Surgeon: Gearlean Alf;  [facility];  Service: Orthopedics;  Laterality: Left;  . Foot  arthrodesis  01/03/12    [facility]-- left hallux MP joint arthrodesis  . Hammer toe surgery  01/03/12    [facility], Dr Doran Durand  . Abdominal hysterectomy  1977    dysfunctional menses; age 65  . Appendectomy  1963  . Cystostomy w/ bladder biopsy  04-2006  . Total knee arthroplasty  06/25/2012    Procedure: TOTAL KNEE ARTHROPLASTY;  Surgeon: Gearlean Alf, MD;  [facility];  Service: Orthopedics;  Laterality: Right;    There were no vitals filed for this visit.  Visit Diagnosis:  Left shoulder pain  Neck pain  Shoulder stiffness, left      Subjective Assessment - 09/18/15 1112    Subjective Patient unsure about tape today, she reports that she also found out last night that her grandson broke his arm and does report stress about that.   Currently in Pain? Yes   Pain Score 3    Pain Location Shoulder   Pain Orientation Left   Aggravating Factors  stress   Pain Relieving Factors tape seemed to help, wants to try the dry needling again  [facility] PT Assessment - 09/18/15 0001    AROM   Left Shoulder Flexion 142 Degrees   Left Shoulder ABduction 133 Degrees   Left Shoulder Internal Rotation 40 Degrees   Left Shoulder External Rotation 70 Degrees                     [facility] Adult PT Treatment/Exercise - 09/18/15 0001    Moist Heat Therapy   Number Minutes Moist Heat 15 Minutes   Moist Heat Location Cervical   Electrical Stimulation   Electrical Stimulation Location left upper trap   Electrical Stimulation Action IFC   Electrical Stimulation Parameters tolerance   Electrical Stimulation Goals Pain   Manual Therapy   Manual Therapy Soft tissue mobilization;Neural Stretch;Joint mobilization   Manual therapy comments occipital release   Joint Mobilization grade II/III joint mobs of the left shoulder all motions   Soft tissue mobilization to left upper trap, rhomboid and c-p-spinals   Neural Stretch left UE, some PROM of the left shoulder    Kinesiotex Inhibit Muscle   Kinesiotix   Inhibit Muscle  upper trap and teres                  PT Short Term Goals - 08/28/15 0929    PT SHORT TERM GOAL #1   Title independent with initial HEP   Status Achieved           PT Long Term Goals - 09/18/15 1121    PT LONG TERM GOAL #3   Title increase left shoulder AROM to 150 degrees flexion   Status On-going   PT LONG TERM GOAL #5   Title dress and do hair without difficulty   Status Partially Met               Plan - 09/18/15 1118    Clinical Impression Statement Patient with continued knots in the left upper trap and the left upper trap, she continues to have decrease ROM of the left shoulder and is very painful with stretching.     PT Next Visit Plan I will see her once next week and then have her see Leonette Most for dry needling once a week after that for a few weeks to see if that will help further.   Consulted and Agree with Plan of Care Patient        Problem List Patient Active Problem List   Diagnosis Date Noted  . OA (osteoarthritis) of knee 06/25/2012  . 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 Osteopenia 03/05/2008  . Hyperlipidemia 12/21/2007  . Essential hypertension 12/21/2007  . GERD 12/21/2007  . CONSTIPATION, CHRONIC 12/21/2007  . FIBROMYALGIA 12/21/2007  . Memory loss 12/21/2007    Sumner Boast., PT 09/18/2015, 11:22 AM  [facility] [address] [place], [place], [address] Phone: [phone]   Fax:  [phone]  Name: Madeline [name] MRN: [id number] Date of Birth: 1946/06/25

## 2015-09-23 ENCOUNTER — Ambulatory Visit: Payer: Medicare Other | Admitting: Physical Therapy

## 2015-09-28 ENCOUNTER — Ambulatory Visit: Payer: Medicare Other | Attending: Internal Medicine | Admitting: Physical Therapy

## 2015-09-28 ENCOUNTER — Encounter: Payer: Self-pay | Admitting: Physical Therapy

## 2015-09-28 DIAGNOSIS — M542 Cervicalgia: Secondary | ICD-10-CM | POA: Diagnosis present

## 2015-09-28 DIAGNOSIS — M25512 Pain in left shoulder: Secondary | ICD-10-CM

## 2015-09-28 DIAGNOSIS — M25612 Stiffness of left shoulder, not elsewhere classified: Secondary | ICD-10-CM | POA: Insufficient documentation

## 2015-09-28 NOTE — Therapy (Signed)
[place], [place], [address] Phone: [phone]   Fax:  [phone]  Physical Therapy Treatment  Patient Details  Name: Madeline [name] MRN: [id number] Date of Birth: 1946/04/14 No Data Recorded  Encounter Date: 09/28/2015      PT End of Session - 09/28/15 1253    Visit Number 23   Date for PT Re-Evaluation 10/26/15   PT Start Time 1200   PT Stop Time 1258   PT Time Calculation (min) 58 min   Activity Tolerance Patient tolerated treatment well   Behavior During Therapy [facility] for tasks assessed/performed      Past Medical History  Diagnosis Date  . Hyperlipidemia   . Cystitis 2007    sepsis post bladder biopsy, Dr Jeffie Pollock  . Headache(784.0) 09-13-11    less frequent to none at present  . TMJ click [id number]    right side > left  . Cataracts, both eyes 09-13-11    not surgical yet  . Hypertension 09-13-11    tx. meds  . 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 GERD (gastroesophageal reflux disease) 09-14-11    tx. Nexium  . Fibromyalgia 09-14-11    Dr Donney Dice, [facility]  . Arthritis 09-14-11    osteoarthritis, osteopenia  . Strep throat 09-14-11    none in a yr.  . Fractures 09-14-11    toes-left foot  . Edema 09-14-11    occ. ankles/ feet > left leg  . Heart murmur 09-13-11  . Anxiety   . History of knee replacement 09/21/2011    left  . 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 H/O foot surgery 12/19/2011    Past Surgical History  Procedure Laterality Date  . Tonsillectomy    . Sigmoidoscopy    . Colonoscopy  2011    negative  . Sling procedure  2006    a and p repair  . Nasal sinus surgery  09-14-11    right side " sinus lift"  . Total knee arthroplasty  09/21/2011    Procedure: TOTAL KNEE ARTHROPLASTY;  Surgeon: Gearlean Alf;  [facility];  Service: Orthopedics;  Laterality: Left;  . Foot  arthrodesis  01/03/12    [facility]-- left hallux MP joint arthrodesis  . Hammer toe surgery  01/03/12    [facility], Dr Doran Durand  . Abdominal hysterectomy  1977    dysfunctional menses; age 79  . Appendectomy  1963  . Cystostomy w/ bladder biopsy  04-2006  . Total knee arthroplasty  06/25/2012    Procedure: TOTAL KNEE ARTHROPLASTY;  Surgeon: Gearlean Alf, MD;  [facility];  Service: Orthopedics;  Laterality: Right;    There were no vitals filed for this visit.  Visit Diagnosis:  Left shoulder pain - Plan: PT plan of care cert/re-cert  Neck pain - Plan: PT plan of care cert/re-cert  Shoulder stiffness, left - Plan: PT plan of care cert/re-cert      Subjective Assessment - 09/28/15 1241    Subjective I think my shoulder is a little better with the ROM,   Currently in Pain? Yes   Pain Score 2    Pain Location Shoulder   Pain Descriptors / Indicators Tightness                         [facility]  Adult PT Treatment/Exercise - 09/28/15 0001    Moist Heat Therapy   Number Minutes Moist Heat 15 Minutes   Moist Heat Location Cervical   Electrical Stimulation   Electrical Stimulation Location left upper trap   Electrical Stimulation Action IFC   Electrical Stimulation Parameters tolerance   Electrical Stimulation Goals Pain   Manual Therapy   Manual Therapy Soft tissue mobilization;Neural Stretch;Joint mobilization   Manual therapy comments occipital release   Joint Mobilization grade II/III joint mobs of the left shoulder all motions   Soft tissue mobilization to left upper trap, rhomboid and c-p-spinals   Neural Stretch left UE, some PROM of the left shoulder                  PT Short Term Goals - 08/28/15 0929    PT SHORT TERM GOAL #1   Title independent with initial HEP   Status Achieved           PT Long Term Goals - 09/28/15 1256    PT LONG TERM GOAL #1   Title understand proper posture and body mechanics   Status Achieved   PT  LONG TERM GOAL #2   Title decrease pain 50%   Status On-going   PT LONG TERM GOAL #3   Title increase left shoulder AROM to 150 degrees flexion   Status On-going   PT LONG TERM GOAL #4   Title increase left shoulder AROM of IR to 65 degrees   Status On-going   PT LONG TERM GOAL #5   Title dress and do hair without difficulty   Status Achieved               Plan - 09/28/15 1254    Clinical Impression Statement Patient continues to have tightness with knots in the left upper trap and into the upper trap area.  She has had numerous stressful situations over the past 2 months that may have led to this, her ROM has continued to increase.   PT Next Visit Plan She will have two treatments over the next few weeks for dry needling   Consulted and Agree with Plan of Care Patient        Problem List Patient Active Problem List   Diagnosis Date Noted  . OA (osteoarthritis) of knee 06/25/2012  . 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 Osteopenia 03/05/2008  . Hyperlipidemia 12/21/2007  . Essential hypertension 12/21/2007  . GERD 12/21/2007  . CONSTIPATION, CHRONIC 12/21/2007  . FIBROMYALGIA 12/21/2007  . Memory loss 12/21/2007    Sumner Boast., PT 09/28/2015, 12:59 PM  [place] [place] [place], [place], [address] Phone: [phone]   Fax:  [phone]  Name: Madeline [name] MRN: [id number] Date of Birth: 1946-06-06

## 2015-09-30 ENCOUNTER — Ambulatory Visit: Payer: Medicare Other | Admitting: Physical Therapy

## 2015-09-30 DIAGNOSIS — M25512 Pain in left shoulder: Secondary | ICD-10-CM

## 2015-09-30 DIAGNOSIS — M542 Cervicalgia: Secondary | ICD-10-CM

## 2015-09-30 DIAGNOSIS — M25612 Stiffness of left shoulder, not elsewhere classified: Secondary | ICD-10-CM

## 2015-09-30 NOTE — Therapy (Signed)
[facility] [address]  [place], [place], [address] Phone: [phone]   Fax:  [phone]  Physical Therapy Treatment  Patient Details  Name: Madeline [name] MRN: [id number] Date of Birth: [birth year] No Data Recorded  Encounter Date: 09/30/2015      PT End of Session - 09/30/15 1114    Visit Number 24   Date for PT Re-Evaluation 10/26/15   PT Start Time 1110   PT Stop Time 1158   PT Time Calculation (min) 48 min      Past Medical History  Diagnosis Date  . Hyperlipidemia   . Cystitis 2007    sepsis post bladder biopsy, Dr Jeffie Pollock  . Headache(784.0) 09-13-11    less frequent to none at present  . TMJ click [id number]    right side > left  . Cataracts, both eyes 09-13-11    not surgical yet  . Hypertension 09-13-11    tx. meds  . 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 GERD (gastroesophageal reflux disease) 09-14-11    tx. Nexium  . Fibromyalgia 09-14-11    Dr Donney Dice, [facility]  . Arthritis 09-14-11    osteoarthritis, osteopenia  . Strep throat 09-14-11    none in a yr.  . Fractures 09-14-11    toes-left foot  . Edema 09-14-11    occ. ankles/ feet > left leg  . Heart murmur 09-13-11  . Anxiety   . History of knee replacement 09/21/2011    left  . 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 H/O foot surgery 12/19/2011    Past Surgical History  Procedure Laterality Date  . Tonsillectomy    . Sigmoidoscopy    . Colonoscopy  2011    negative  . Sling procedure  2006    a and p repair  . Nasal sinus surgery  09-14-11    right side " sinus lift"  . Total knee arthroplasty  09/21/2011    Procedure: TOTAL KNEE ARTHROPLASTY;  Surgeon: Gearlean Alf;  Location: [facility];  Service: Orthopedics;  Laterality: Left;  . Foot arthrodesis  01/03/12    [facility]-- left hallux MP joint arthrodesis  . Hammer toe surgery  01/03/12    [facility], Dr Doran Durand  . Abdominal hysterectomy  1977    dysfunctional menses; age 67  . Appendectomy  1963  . Cystostomy w/ bladder biopsy  04-2006  . Total knee arthroplasty  06/25/2012    Procedure: TOTAL KNEE ARTHROPLASTY;  Surgeon: Gearlean Alf, MD;  [facility]: [facility];  Service: Orthopedics;  Laterality: Right;    There were no vitals filed for this visit.  Visit Diagnosis:  Left shoulder pain  Neck pain  Shoulder stiffness, left      Subjective Assessment - 09/30/15 1111    Subjective states feels as though dry needling session helped when performed on 09/08/15.   Currently in Pain? Yes   Pain Score --  7-8/10 with activity, 2-3/10 at rest   Pain Location Shoulder   Pain Orientation Left           TODAY'S TREATMENT Manual - Dry Needling (informed verbal consent provided prior to treatment) performed to L Upper Trapezius, L upper c-spine paraspinals, L infraspinatus, and L teres group.  Twitch response noted in upper trap and teres group.  Premod e-stim (10-20 Hz) run in several 20"  bouts between 5 needles in infraspin/teres group to assist with improved pain and tone.  Following needling; applied 2 strips of kinesiotape: 25% along infraspin from anterior deltoid and 50% along upper trap from lateral deltoid             PT Short Term Goals - 08/28/15 0929    PT SHORT TERM GOAL #1   Title independent with initial HEP   Status Achieved           PT Long Term Goals - 09/28/15 1256    PT LONG TERM GOAL #1   Title understand proper posture and body mechanics   Status Achieved   PT LONG TERM GOAL #2   Title decrease pain 50%   Status On-going   PT LONG TERM GOAL #3   Title increase left shoulder AROM to 150 degrees flexion   Status On-going   PT LONG TERM GOAL #4   Title increase left shoulder AROM of IR to 65 degrees   Status On-going   PT LONG TERM GOAL #5   Title dress and do hair without difficulty   Status Achieved               Plan  - 09/30/15 1202    Clinical Impression Statement Pt feels initial dry needling session was beneficial so returns today for additional treatment.  Performed needling throughout L scapular mms again today.  She returns here next week for assessment of needling and additional treatment if indicated.   PT Next Visit Plan assess for needling benefit   Consulted and Agree with Plan of Care Patient        Problem List Patient Active Problem List   Diagnosis Date Noted  . OA (osteoarthritis) of knee 06/25/2012  . 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 Osteopenia 03/05/2008  . Hyperlipidemia 12/21/2007  . Essential hypertension 12/21/2007  . GERD 12/21/2007  . CONSTIPATION, CHRONIC 12/21/2007  . FIBROMYALGIA 12/21/2007  . Memory loss 12/21/2007    Aishwarya Shiplett PT, OCS 09/30/2015, 12:05 PM  [facility] [address]  [place], [place], [address] Phone: [phone]   Fax:  [phone]  Name: Madeline [name] MRN: [id number] Date of Birth: [birth year]

## 2015-10-07 ENCOUNTER — Ambulatory Visit: Payer: Medicare Other | Admitting: Physical Therapy

## 2015-10-07 DIAGNOSIS — M25512 Pain in left shoulder: Secondary | ICD-10-CM | POA: Diagnosis not present

## 2015-10-07 DIAGNOSIS — M542 Cervicalgia: Secondary | ICD-10-CM

## 2015-10-07 DIAGNOSIS — M25612 Stiffness of left shoulder, not elsewhere classified: Secondary | ICD-10-CM

## 2015-10-07 NOTE — Therapy (Signed)
[facility] [address]  [place], [place], [address] Phone: [phone]   Fax:  [phone]  Physical Therapy Treatment  Patient Details  Name: Madeline [name] MRN: [id number] Date of Birth: [birth year] No Data Recorded  Encounter Date: 10/07/2015      PT End of Session - 10/07/15 1117    Visit Number 25   PT Start Time 1113   PT Stop Time 1202   PT Time Calculation (min) 49 min      Past Medical History  Diagnosis Date  . Hyperlipidemia   . Cystitis 2007    sepsis post bladder biopsy, Dr Jeffie Pollock  . Headache(784.0) 09-13-11    less frequent to none at present  . TMJ click [id number]    right side > left  . Cataracts, both eyes 09-13-11    not surgical yet  . Hypertension 09-13-11    tx. meds  . 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 GERD (gastroesophageal reflux disease) 09-14-11    tx. Nexium  . Fibromyalgia 09-14-11    Dr Donney Dice, [facility]  . Arthritis 09-14-11    osteoarthritis, osteopenia  . Strep throat 09-14-11    none in a yr.  . Fractures 09-14-11    toes-left foot  . Edema 09-14-11    occ. ankles/ feet > left leg  . Heart murmur 09-13-11  . Anxiety   . History of knee replacement 09/21/2011    left  . 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 H/O foot surgery 12/19/2011    Past Surgical History  Procedure Laterality Date  . Tonsillectomy    . Sigmoidoscopy    . Colonoscopy  2011    negative  . Sling procedure  2006    a and p repair  . Nasal sinus surgery  09-14-11    right side " sinus lift"  . Total knee arthroplasty  09/21/2011    Procedure: TOTAL KNEE ARTHROPLASTY;  Surgeon: Gearlean Alf;  [facility];  Service: Orthopedics;  Laterality: Left;  . Foot arthrodesis  01/03/12    [facility]-- left hallux MP joint arthrodesis  . Hammer toe surgery  01/03/12    [facility], Dr Doran Durand  . Abdominal  hysterectomy  1977    dysfunctional menses; age 57  . Appendectomy  1963  . Cystostomy w/ bladder biopsy  04-2006  . Total knee arthroplasty  06/25/2012    Procedure: TOTAL KNEE ARTHROPLASTY;  Surgeon: Gearlean Alf, MD;  [facility];  Service: Orthopedics;  Laterality: Right;    There were no vitals filed for this visit.  Visit Diagnosis:  Left shoulder pain  Neck pain  Shoulder stiffness, left      Subjective Assessment - 10/07/15 1114    Subjective no pain at rest, 2/10 with shoulder ABD AROM, c/o n/t in L digits #4-5.  States initially with some increased pain following last treatment but once this subsided has felt good.  States felt really good for 3 days and only mild pain today.   Currently in Pain? Yes   Pain Score 2    Pain Location Shoulder   Pain Orientation Left   Pain Radiating Towards n/t in L #4-5 digits           TODAY'S TREATMENT Manual - Dry Needling (informed verbal consent provided prior to treatment) performed to L infraspinatus  and L teres group. Premod e-stim (10-20 Hz) run in several 20" bouts between 4 needles in infraspin/teres group to assist with improved pain and tone.  Then performed dry needling to lateral aspect of L pectoralis major.  Twitch response and improved tone noted with pectoral needling.  Following needling; applied 3 strips of kinesiotape: 25% along upper aspect of L pectoral inferior to clavicle, 25% to infraspin from anterior deltoid, and 50% along upper trap from lateral deltoid              PT Short Term Goals - 08/28/15 0929    PT SHORT TERM GOAL #1   Title independent with initial HEP   Status Achieved           PT Long Term Goals - 09/28/15 1256    PT LONG TERM GOAL #1   Title understand proper posture and body mechanics   Status Achieved   PT LONG TERM GOAL #2   Title decrease pain 50%   Status On-going   PT LONG TERM GOAL #3   Title increase left shoulder AROM to 150 degrees flexion   Status  On-going   PT LONG TERM GOAL #4   Title increase left shoulder AROM of IR to 65 degrees   Status On-going   PT LONG TERM GOAL #5   Title dress and do hair without difficulty   Status Achieved               Plan - 10/07/15 1226    Clinical Impression Statement Mrs. Heckard reports noting significant benefit since last PT treatment which included dry needling.  She states the catch in her neck noted last time is no longer present and her pain has improved to intermittent and averaging 2/10 with activity (was around 7/10 last visit with activity and 2/10 at rest).  She notes n/t to L digits #4-5.  Noted significant tenderness and high tone throughout L pectorals.  Addressed this area with dry needling today as well and could palpate some improvmement in tone so am hopeful this will further improve her overall symptoms.   PT Next Visit Plan continue with dry needling to L periscapular and pectoral mms; functional mobility/AROM training   Consulted and Agree with Plan of Care Patient        Problem List Patient Active Problem List   Diagnosis Date Noted  . OA (osteoarthritis) of knee 06/25/2012  . 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 Osteopenia 03/05/2008  . Hyperlipidemia 12/21/2007  . Essential hypertension 12/21/2007  . GERD 12/21/2007  . CONSTIPATION, CHRONIC 12/21/2007  . FIBROMYALGIA 12/21/2007  . Memory loss 12/21/2007    Zamarion Longest PT, OCS 10/07/2015, 12:30 PM  [facility] [address]  [place], [place], [address] Phone: [phone]   Fax:  [phone]  Name: Madeline [name] MRN: [id number] Date of Birth: 1945-09-30

## 2015-10-07 NOTE — Addendum Note (Signed)
Addended by: Sumner Boast on: 10/07/2015 02:38 PM   Modules accepted: Orders

## 2015-10-13 ENCOUNTER — Encounter: Payer: Self-pay | Admitting: Physical Therapy

## 2015-10-13 ENCOUNTER — Ambulatory Visit: Payer: Medicare Other | Admitting: Physical Therapy

## 2015-10-13 DIAGNOSIS — M542 Cervicalgia: Secondary | ICD-10-CM

## 2015-10-13 DIAGNOSIS — M25512 Pain in left shoulder: Secondary | ICD-10-CM | POA: Diagnosis not present

## 2015-10-13 DIAGNOSIS — M25612 Stiffness of left shoulder, not elsewhere classified: Secondary | ICD-10-CM

## 2015-10-13 NOTE — Therapy (Signed)
[facility] [place], [place], [address] Phone: [phone]   Fax:  [phone]  Physical Therapy Treatment  Patient Details  Name: Madeline [name] MRN: [id number] Date of Birth: Jan 20, 1946 No Data Recorded  Encounter Date: 10/13/2015      PT End of Session - 10/13/15 1704    Visit Number 26   Date for PT Re-Evaluation 11/05/15   PT Start Time 1318   PT Stop Time 1410   PT Time Calculation (min) 52 min   Activity Tolerance Patient limited by pain   Behavior During Therapy [facility] for tasks assessed/performed      Past Medical History  Diagnosis Date  . Hyperlipidemia   . Cystitis 2007    sepsis post bladder biopsy, Dr Jeffie Pollock  . Headache(784.0) 09-13-11    less frequent to none at present  . TMJ click 03-49-17    right side > left  . Cataracts, both eyes 09-13-11    not surgical yet  . Hypertension 09-13-11    tx. meds  . 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 GERD (gastroesophageal reflux disease) 09-14-11    tx. Nexium  . Fibromyalgia 09-14-11    Dr Donney Dice, [facility]  . Arthritis 09-14-11    osteoarthritis, osteopenia  . Strep throat 09-14-11    none in a yr.  . Fractures 09-14-11    toes-left foot  . Edema 09-14-11    occ. ankles/ feet > left leg  . Heart murmur 09-13-11  . Anxiety   . History of knee replacement 09/21/2011    left  . 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 H/O foot surgery 12/19/2011    Past Surgical History  Procedure Laterality Date  . Tonsillectomy    . Sigmoidoscopy    . Colonoscopy  2011    negative  . Sling procedure  2006    a and p repair  . Nasal sinus surgery  09-14-11    right side " sinus lift"  . Total knee arthroplasty  09/21/2011    Procedure: TOTAL KNEE ARTHROPLASTY;  Surgeon: Gearlean Alf;  [facility];  Service: Orthopedics;  Laterality: Left;  . Foot arthrodesis   01/03/12    [facility]-- left hallux MP joint arthrodesis  . Hammer toe surgery  01/03/12    [facility], Dr Doran Durand  . Abdominal hysterectomy  1977    dysfunctional menses; age 38  . Appendectomy  1963  . Cystostomy w/ bladder biopsy  04-2006  . Total knee arthroplasty  06/25/2012    Procedure: TOTAL KNEE ARTHROPLASTY;  Surgeon: Gearlean Alf, MD;  Location: [facility];  Service: Orthopedics;  Laterality: Right;    There were no vitals filed for this visit.  Visit Diagnosis:  Left shoulder pain  Neck pain  Shoulder stiffness, left      Subjective Assessment - 10/13/15 1652    Subjective Patient reports overall that she is feeling better and demonstrates improved shoulder motions.   Currently in Pain? Yes   Pain Score 1    Pain Location Shoulder   Pain Orientation Left   Pain Descriptors / Indicators Tightness   Pain Type Chronic pain   Aggravating Factors  stress and driving will increase pain   Pain Relieving Factors the treatment has helped  [facility] Adult PT Treatment/Exercise - 10/13/15 0001    Moist Heat Therapy   Number Minutes Moist Heat 15 Minutes   Moist Heat Location Cervical   Electrical Stimulation   Electrical Stimulation Location left upper trap   Electrical Stimulation Action IFC   Electrical Stimulation Parameters tolerance   Electrical Stimulation Goals Pain   Manual Therapy   Manual Therapy Soft tissue mobilization;Neural Stretch;Joint mobilization   Manual therapy comments occipital release   Joint Mobilization grade II/III joint mobs of the left shoulder all motions   Soft tissue mobilization to left upper trap, rhomboid and c-p-spinals   Neural Stretch left UE, some PROM of the left shoulder   Kinesiotex Inhibit Muscle   Kinesiotix   Inhibit Muscle  upper trap and teres                  PT Short Term Goals - 08/28/15 0929    PT SHORT TERM GOAL #1   Title independent with initial HEP   Status  Achieved           PT Long Term Goals - 10/13/15 1709    PT LONG TERM GOAL #2   Title decrease pain 50%   Status Partially Met               Plan - 10/13/15 1706    Clinical Impression Statement Patient reports that the last treatment of dry needling that included the pectorals really helped.  She reports that she is having less overall pain and feels that the ROM of the arm is improving.  She continues to have knots and has a great deal of difficulty relaxing.   PT Next Visit Plan She is set up with the dry needling treatment only for the next month 1x/week, for periscap and pectoral region   Consulted and Agree with Plan of Care Patient        Problem List Patient Active Problem List   Diagnosis Date Noted  . OA (osteoarthritis) of knee 06/25/2012  . 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 Osteopenia 03/05/2008  . Hyperlipidemia 12/21/2007  . Essential hypertension 12/21/2007  . GERD 12/21/2007  . CONSTIPATION, CHRONIC 12/21/2007  . FIBROMYALGIA 12/21/2007  . Memory loss 12/21/2007    Sumner Boast., PT 10/13/2015, 5:10 PM  [place], [place], [address] Phone: [phone]   Fax:  [phone]  Name: Madeline [name] MRN: [id number] Date of Birth: July 06, 1946

## 2015-10-20 ENCOUNTER — Ambulatory Visit: Payer: Medicare Other | Admitting: Physical Therapy

## 2015-10-20 DIAGNOSIS — M25512 Pain in left shoulder: Secondary | ICD-10-CM

## 2015-10-20 DIAGNOSIS — M542 Cervicalgia: Secondary | ICD-10-CM

## 2015-10-20 NOTE — Therapy (Signed)
[facility] [address]  [place], [place], [address] Phone: [phone]   Fax:  [phone]  Physical Therapy Treatment  Patient Details  Name: Madeline [name] MRN: [id number] Date of Birth: [birth year] No Data Recorded  Encounter Date: 10/20/2015      PT End of Session - 10/20/15 1532    Visit Number 27   Date for PT Re-Evaluation 11/05/15   PT Start Time 1532   PT Stop Time 7026   PT Time Calculation (min) 42 min      Past Medical History  Diagnosis Date  . Hyperlipidemia   . Cystitis 2007    sepsis post bladder biopsy, Dr Jeffie Pollock  . Headache(784.0) 09-13-11    less frequent to none at present  . TMJ click [id number]    right side > left  . Cataracts, both eyes 09-13-11    not surgical yet  . Hypertension 09-13-11    tx. meds  . 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 GERD (gastroesophageal reflux disease) 09-14-11    tx. Nexium  . Fibromyalgia 09-14-11    Dr Donney Dice, [facility]  . Arthritis 09-14-11    osteoarthritis, osteopenia  . Strep throat 09-14-11    none in a yr.  . Fractures 09-14-11    toes-left foot  . Edema 09-14-11    occ. ankles/ feet > left leg  . Heart murmur 09-13-11  . Anxiety   . History of knee replacement 09/21/2011    left  . 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 H/O foot surgery 12/19/2011    Past Surgical History  Procedure Laterality Date  . Tonsillectomy    . Sigmoidoscopy    . Colonoscopy  2011    negative  . Sling procedure  2006    a and p repair  . Nasal sinus surgery  09-14-11    right side " sinus lift"  . Total knee arthroplasty  09/21/2011    Procedure: TOTAL KNEE ARTHROPLASTY;  Surgeon: Gearlean Alf;  [facility];  Service: Orthopedics;  Laterality: Left;  . Foot arthrodesis  01/03/12     ortho-- left hallux MP joint arthrodesis  . Hammer toe surgery  01/03/12    [facility], Dr Doran Durand  . Abdominal hysterectomy  1977    dysfunctional menses; age 6  . Appendectomy  1963  . Cystostomy w/ bladder biopsy  04-2006  . Total knee arthroplasty  06/25/2012    Procedure: TOTAL KNEE ARTHROPLASTY;  Surgeon: Gearlean Alf, MD;  [facility];  Service: Orthopedics;  Laterality: Right;    There were no vitals filed for this visit.  Visit Diagnosis:  Left shoulder pain  Neck pain      Subjective Assessment - 10/20/15 1534    Subjective States is doing pretty good today but states had great deal of difficulty sleeping last night due to pain throughout L posterior scapular pain.  States pain was 8/10 last night and is currently 4/10.  States L shoulder ROM feels bettter than last week in that is able to reach behind back with only mild discomfort.   Currently in Pain? Yes   Pain Score 4    Pain Location Scapula   Pain Orientation Left       TODAY'S TREATMENT Manual - Dry Needling (informed verbal consent provided prior to treatment) performed to L  infraspinatus and L teres group. Premod e-stim (10-20 Hz) run in several 20" bouts between 4 needles in infraspin/teres group to assist with improved pain and tone.Also performed Dry Needling to L upper trap with good twitch response and into L lateral mid and upper c-spine paraspinals (no twitch response). Overall, well tolerated today.  Following needling; applied 2 strips of kinesiotape: 25% to infraspin from anterior deltoid, and 50% along upper trap from lateral deltoid          PT Short Term Goals - 08/28/15 0929    PT SHORT TERM GOAL #1   Title independent with initial HEP   Status Achieved           PT Long Term Goals - 10/13/15 1709    PT LONG TERM GOAL #2   Title decrease pain 50%   Status Partially Met               Plan - 10/20/15 1802    Clinical Impression Statement pt reports noting improving function in L shoulder in that able to reach behind back with greater ease.   States is able to lean L UE on arm rest in car now - states hasn't been able to do this in quite some time.  Today she notes pain throughout L UT and infraspinatus again but also some into mid/upper c-spine.  Addressed with DN and taping today.   PT Next Visit Plan continue with dry needling to L scapular mms and into c-spine PRN; taping PRN; scapular retraction and stability training / HEP progressions as able.   Consulted and Agree with Plan of Care Patient        Problem List Patient Active Problem List   Diagnosis Date Noted  . OA (osteoarthritis) of knee 06/25/2012  . 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 Osteopenia 03/05/2008  . Hyperlipidemia 12/21/2007  . Essential hypertension 12/21/2007  . GERD 12/21/2007  . CONSTIPATION, CHRONIC 12/21/2007  . FIBROMYALGIA 12/21/2007  . Memory loss 12/21/2007    Jahvon Gosline PT, OCS 10/20/2015, 6:09 PM  [facility] [address]  [place], [place], [address] Phone: [phone]   Fax:  [phone]  Name: Madeline [name] MRN: [id number] Date of Birth: 10/15/1945

## 2015-10-27 ENCOUNTER — Ambulatory Visit: Payer: Medicare Other | Attending: Internal Medicine | Admitting: Physical Therapy

## 2015-10-27 DIAGNOSIS — M25612 Stiffness of left shoulder, not elsewhere classified: Secondary | ICD-10-CM | POA: Insufficient documentation

## 2015-10-27 DIAGNOSIS — M542 Cervicalgia: Secondary | ICD-10-CM | POA: Diagnosis present

## 2015-10-27 DIAGNOSIS — M25512 Pain in left shoulder: Secondary | ICD-10-CM

## 2015-10-27 NOTE — Therapy (Signed)
[facility] [address]  [place], [place], [address] Phone: [phone]   Fax:  [phone]  Physical Therapy Treatment  Patient Details  Name: Madeline [name] MRN: [id number] Date of Birth: Mar 23, 1946 No Data Recorded  Encounter Date: 10/27/2015      PT End of Session - 10/27/15 1113    Visit Number 28   PT Start Time 1111   PT Stop Time 4034   PT Time Calculation (min) 45 min      Past Medical History  Diagnosis Date  . Hyperlipidemia   . Cystitis 2007    sepsis post bladder biopsy, Dr Jeffie Pollock  . Headache(784.0) 09-13-11    less frequent to none at present  . TMJ click [id number]    right side > left  . Cataracts, both eyes 09-13-11    not surgical yet  . Hypertension 09-13-11    tx. meds  . 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 GERD (gastroesophageal reflux disease) 09-14-11    tx. Nexium  . Fibromyalgia 09-14-11    Dr Donney Dice, [facility]  . Arthritis 09-14-11    osteoarthritis, osteopenia  . Strep throat 09-14-11    none in a yr.  . Fractures 09-14-11    toes-left foot  . Edema 09-14-11    occ. ankles/ feet > left leg  . Heart murmur 09-13-11  . Anxiety   . History of knee replacement 09/21/2011    left  . 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 H/O foot surgery 12/19/2011    Past Surgical History  Procedure Laterality Date  . Tonsillectomy    . Sigmoidoscopy    . Colonoscopy  2011    negative  . Sling procedure  2006    a and p repair  . Nasal sinus surgery  09-14-11    right side " sinus lift"  . Total knee arthroplasty  09/21/2011    Procedure: TOTAL KNEE ARTHROPLASTY;  Surgeon: Gearlean Alf;  [facility]: [facility];  Service: Orthopedics;  Laterality: Left;  . Foot arthrodesis  01/03/12    [facility] [facility]-- left hallux MP joint arthrodesis  . Hammer toe surgery  01/03/12    [facility], Dr Doran Durand  . Abdominal  hysterectomy  1977    dysfunctional menses; age 30  . Appendectomy  1963  . Cystostomy w/ bladder biopsy  04-2006  . Total knee arthroplasty  06/25/2012    Procedure: TOTAL KNEE ARTHROPLASTY;  Surgeon: Gearlean Alf, MD;  [facility];  Service: Orthopedics;  Laterality: Right;    There were no vitals filed for this visit.  Visit Diagnosis:  Left shoulder pain  Neck pain  Shoulder stiffness, left      Subjective Assessment - 10/27/15 1114    Subjective Pt reports has been under great deal of stress since last PT treatment due to family issues.  Despite her stress, she states she's feeling "pretty good" with regard to pain this AM. Today she rates her pain 2-3/10 but states is still not able to use L UE "like I want to".   Patient Stated Goals going on vaction Jan 21, 2016 which will include snorkeling   Currently in Pain? Yes   Pain Score --  2-3/10   Pain Location Scapula   Pain Orientation Left          TODAY'S TREATMENT"  Manual -  Dry Needling (informed verbal consent provided prior to treatment) performed to L Lateral Deltoid with good twitch response noted.  Then performed L infraspinatus and L teres group with Premod e-stim (10-20 Hz) run in several 20" bouts between 5 needles in infraspin/teres group to assist with improved pain and tone.Lastly, performed Dry Needling to L mid t-spine paraspinals due to pt noting and tenderness in this area today. Overall, well tolerated today.  Following needling; applied 2 strips of kinesiotape: 25% to infraspin/teres from anterior deltoid, and 50% along upper trap from lateral deltoid           PT Short Term Goals - 08/28/15 0929    PT SHORT TERM GOAL #1   Title independent with initial HEP   Status Achieved           PT Long Term Goals - 10/13/15 1709    PT LONG TERM GOAL #2   Title decrease pain 50%   Status Partially Met               Plan - 10/27/15 1203    Clinical Impression Statement despite  great deal of stress lately pt reports improvement in pain since last treatment.  She reports still having difficulty with reaching activities and function with L UE but is improving.   PT Next Visit Plan assess shoulder AROM; continue with dry needling to L scapular mms and into c-spine PRN; taping PRN; scapular retraction and stability training / HEP progressions as able.   Consulted and Agree with Plan of Care Patient        Problem List Patient Active Problem List   Diagnosis Date Noted  . OA (osteoarthritis) of knee 06/25/2012  . 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 Osteopenia 03/05/2008  . Hyperlipidemia 12/21/2007  . Essential hypertension 12/21/2007  . GERD 12/21/2007  . CONSTIPATION, CHRONIC 12/21/2007  . FIBROMYALGIA 12/21/2007  . Memory loss 12/21/2007    Deontae Robson PT, OCS 10/27/2015, 12:06 PM  [facility] [address]  [place], [place], [address] Phone: [phone]   Fax:  [phone]  Name: Madeline [name] MRN: [id number] Date of Birth: [birth year]

## 2015-11-03 ENCOUNTER — Ambulatory Visit: Payer: Medicare Other | Admitting: Physical Therapy

## 2015-11-03 DIAGNOSIS — M25512 Pain in left shoulder: Secondary | ICD-10-CM

## 2015-11-03 DIAGNOSIS — M542 Cervicalgia: Secondary | ICD-10-CM

## 2015-11-03 NOTE — Therapy (Signed)
[facility] [address]  [address] [place], [place], [address] Phone: [phone]   Fax:  [phone]  Physical Therapy Treatment  Patient Details  Name: Madeline [name] MRN: [id number] Date of Birth: 1946/03/29 No Data Recorded  Encounter Date: 11/03/2015      PT End of Session - 11/03/15 1415    Visit Number 29   PT Start Time 1410   PT Stop Time 1506   PT Time Calculation (min) 56 min      Past Medical History  Diagnosis Date  . Hyperlipidemia   . Cystitis 2007    sepsis post bladder biopsy, Dr Annabell Howells  . Headache(784.0) 09-13-11    less frequent to none at present  . TMJ click 09-13-11    right side > left  . Cataracts, both eyes 09-13-11    not surgical yet  . Hypertension 09-13-11    tx. meds  . 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 GERD (gastroesophageal reflux disease) 09-14-11    tx. Nexium  . Fibromyalgia 09-14-11    Dr Dian Queen, [facility]  . Arthritis 09-14-11    osteoarthritis, osteopenia  . Strep throat 09-14-11    none in a yr.  . Fractures 09-14-11    toes-left foot  . Edema 09-14-11    occ. ankles/ feet > left leg  . Heart murmur 09-13-11  . Anxiety   . History of knee replacement 09/21/2011    left  . 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 H/O foot surgery 12/19/2011    Past Surgical History  Procedure Laterality Date  . Tonsillectomy    . Sigmoidoscopy    . Colonoscopy  2011    negative  . Sling procedure  2006    a and p repair  . Nasal sinus surgery  09-14-11    right side " sinus lift"  . Total knee arthroplasty  09/21/2011    Procedure: TOTAL KNEE ARTHROPLASTY;  Surgeon: Loanne Drilling;  [facility];  Service: Orthopedics;  Laterality: Left;  . Foot arthrodesis  01/03/12    [place]-- left hallux MP joint arthrodesis  . Hammer toe surgery  01/03/12    [facility], Dr Victorino Dike  . Abdominal  hysterectomy  1977    dysfunctional menses; age 85  . Appendectomy  1963  . Cystostomy w/ bladder biopsy  04-2006  . Total knee arthroplasty  06/25/2012    Procedure: TOTAL KNEE ARTHROPLASTY;  Surgeon: Loanne Drilling, MD;  [facility];  Service: Orthopedics;  Laterality: Right;    There were no vitals filed for this visit.  Visit Diagnosis:  Left shoulder pain  Neck pain      Subjective Assessment - 11/03/15 1416    Subjective Pt states pain has ranged between 2/10 and 6/10 since last treatment.  She states her pain is currently 4/10 today stating pain increased earlier today after sitting in dentist chair. Continued n/t in L #4-5 digits   Currently in Pain? Yes   Pain Score 4    Pain Location Scapula   Pain Orientation Left            [facility] PT Assessment - 11/03/15 0001    AROM   Left Shoulder Flexion 125 Degrees           TODAY'S TREATMENT"  Manual - Dry Needling (informed verbal consent provided  prior to treatment) performed to L infraspinatus and L teres group with Premod e-stim (10-20 Hz) run in several 20" bouts between 5 needles in infraspin/teres group to assist with improved pain and tone.Then performed Dry Needling to L mid t-spine paraspinals due to pt noting pain there again today.  Premod e-stim (10-20 Hz) run in several 20" bouts between the 3 needles in this area.  Lastly, DN perofrmed to R upper trap with good twitch response noted. Following needling, performed STM and stretch to R serratus and teres with shoulder flexion.  Following needling; applied 2 strips of kinesiotape: 25% to infraspin/teres from anterior deltoid, and 50% along upper trap from lateral deltoid             PT Short Term Goals - 08/28/15 0929    PT SHORT TERM GOAL #1   Title independent with initial HEP   Status Achieved           PT Long Term Goals - 10/13/15 1709    PT LONG TERM GOAL #2   Title decrease pain 50%   Status Partially Met                Plan - 11/03/15 1510    Clinical Impression Statement increased pain this PM due to sitting in dentist chair earlier today.  Performed manual to L shoulder after needling today due to increased stiffness in shoulder (AROM flexion limited to 125 at start of treatment).   PT Next Visit Plan re-assess shoulder AROM; continue with dry needling to L scapular mms and into c-spine PRN; taping PRN; scapular retraction and stability training / HEP progressions as able.   Consulted and Agree with Plan of Care Patient        Problem List Patient Active Problem List   Diagnosis Date Noted  . OA (osteoarthritis) of knee 06/25/2012  . 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 Osteopenia 03/05/2008  . Hyperlipidemia 12/21/2007  . Essential hypertension 12/21/2007  . GERD 12/21/2007  . CONSTIPATION, CHRONIC 12/21/2007  . FIBROMYALGIA 12/21/2007  . Memory loss 12/21/2007    Jessicah Croll PT, OCS 11/03/2015, 3:12 PM  [facility] [address]  [place], [place], [address] Phone: [phone]   Fax:  [phone]  Name: Madeline [name] MRN: [id number] Date of Birth: 03-28-1946

## 2015-11-10 ENCOUNTER — Ambulatory Visit: Payer: Medicare Other | Admitting: Physical Therapy

## 2015-11-17 ENCOUNTER — Ambulatory Visit: Payer: Medicare Other | Admitting: Physical Therapy

## 2015-11-17 DIAGNOSIS — M25512 Pain in left shoulder: Secondary | ICD-10-CM

## 2015-11-17 DIAGNOSIS — M25612 Stiffness of left shoulder, not elsewhere classified: Secondary | ICD-10-CM

## 2015-11-17 DIAGNOSIS — M542 Cervicalgia: Secondary | ICD-10-CM

## 2015-11-17 NOTE — Therapy (Signed)
[facility] [address]  [place], [place], [address] Phone: [phone]   Fax:  [phone]  Physical Therapy Treatment  Patient Details  Name: Madeline [name] MRN: [id number] Date of Birth: 01-25-1946 No Data Recorded  Encounter Date: 11/17/2015      PT End of Session - 11/17/15 1024    Visit Number 30   PT Start Time 1022   PT Stop Time 1102   PT Time Calculation (min) 40 min      Past Medical History  Diagnosis Date  . Hyperlipidemia   . Cystitis 2007    sepsis post bladder biopsy, Dr Jeffie Pollock  . Headache(784.0) 09-13-11    less frequent to none at present  . TMJ click 09-73-53    right side > left  . Cataracts, both eyes 09-13-11    not surgical yet  . Hypertension 09-13-11    tx. meds  . 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 GERD (gastroesophageal reflux disease) 09-14-11    tx. Nexium  . Fibromyalgia 09-14-11    Dr Donney Dice, [facility]  . Arthritis 09-14-11    osteoarthritis, osteopenia  . Strep throat 09-14-11    none in a yr.  . Fractures 09-14-11    toes-left foot  . Edema 09-14-11    occ. ankles/ feet > left leg  . Heart murmur 09-13-11  . Anxiety   . History of knee replacement 09/21/2011    left  . 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 H/O foot surgery 12/19/2011    Past Surgical History  Procedure Laterality Date  . Tonsillectomy    . Sigmoidoscopy    . Colonoscopy  2011    negative  . Sling procedure  2006    a and p repair  . Nasal sinus surgery  09-14-11    right side " sinus lift"  . Total knee arthroplasty  09/21/2011    Procedure: TOTAL KNEE ARTHROPLASTY;  Surgeon: Gearlean Alf;  [facility];  Service: Orthopedics;  Laterality: Left;  . Foot arthrodesis  01/03/12    [facility] [facility]-- left hallux MP joint arthrodesis  . Hammer toe surgery  01/03/12    [facility], Dr Doran Durand  . Abdominal  hysterectomy  1977    dysfunctional menses; age 70  . Appendectomy  1963  . Cystostomy w/ bladder biopsy  04-2006  . Total knee arthroplasty  06/25/2012    Procedure: TOTAL KNEE ARTHROPLASTY;  Surgeon: Gearlean Alf, MD;  [facility];  Service: Orthopedics;  Laterality: Right;    There were no vitals filed for this visit.  Visit Diagnosis:  Left shoulder pain  Neck pain  Shoulder stiffness, left      Subjective Assessment - 11/17/15 1025    Subjective Pt cxl'd PT appointment last week due to being dizzy most of the day.  States this has since improved and not an issue today. States is sore to anterior and posterior L shoulder/scapula.  States feels more stiff and more difficult to reach behind back today believing is due to missing PT last week.   Currently in Pain? Yes   Pain Score --  3-4/10   Pain Location Scapula   Pain Orientation Left             TODAY'S TREATMENT"  Manual - Dry Needling (informed verbal consent provided prior to treatment)  performed to L infraspinatus and L teres group with Premod e-stim (10-20 Hz) run in several 20" bouts between 5 needles in infraspin/teres group to assist with improved pain and tone.Then performed Dry Needling to L mid upper trap.  Then with pt supine, performed dry needling to L lateral deltoid and to lateral pectoral.  Following needling; applied 2 strips of kinesiotape: 25% to infraspin/teres from anterior deltoid, and 50% along upper trap from lateral deltoid           PT Short Term Goals - 08/28/15 0929    PT SHORT TERM GOAL #1   Title independent with initial HEP   Status Achieved           PT Long Term Goals - 10/13/15 1709    PT LONG TERM GOAL #2   Title decrease pain 50%   Status Partially Met               Plan - 11/17/15 1746    Clinical Impression Statement Pt notes subjective improvment with dry needling but overall lasting improvement in level of function is not there.  Her AROM is  basically unchanged lately.  Assessment of L shoulder indicates subacromial impingement (POS [place]) but also questionable RC lesion (POS Empty can, painful and limited ABD AROM).  I asked pt to return to her primary PT since he is at different location or to MD for further assessment.   PT Next Visit Plan assess for RC injury   Consulted and Agree with Plan of Care Patient        Problem List Patient Active Problem List   Diagnosis Date Noted  . OA (osteoarthritis) of knee 06/25/2012  . 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 Osteopenia 03/05/2008  . Hyperlipidemia 12/21/2007  . Essential hypertension 12/21/2007  . GERD 12/21/2007  . CONSTIPATION, CHRONIC 12/21/2007  . FIBROMYALGIA 12/21/2007  . Memory loss 12/21/2007    Shaunta Oncale PT, OCS 11/17/2015, 5:56 PM  [facility] [address]  [place], [place], [address] Phone: [phone]   Fax:  [phone]  Name: Madeline [name] MRN: [id number] Date of Birth: 02-Feb-1946

## 2015-11-25 ENCOUNTER — Ambulatory Visit: Payer: Medicare Other | Attending: Internal Medicine | Admitting: Physical Therapy

## 2015-11-25 ENCOUNTER — Encounter: Payer: Self-pay | Admitting: Physical Therapy

## 2015-11-25 DIAGNOSIS — M542 Cervicalgia: Secondary | ICD-10-CM | POA: Diagnosis present

## 2015-11-25 DIAGNOSIS — M25512 Pain in left shoulder: Secondary | ICD-10-CM | POA: Insufficient documentation

## 2015-11-25 DIAGNOSIS — M25612 Stiffness of left shoulder, not elsewhere classified: Secondary | ICD-10-CM | POA: Insufficient documentation

## 2015-11-25 NOTE — Therapy (Signed)
[facility] [address] [place], [place], [address] Phone: [phone]   Fax:  [phone]  Physical Therapy Treatment  Patient Details  Name: Madeline [name] MRN: [id number] Date of Birth: [birth year] No Data Recorded  Encounter Date: 11/25/2015      PT End of Session - 11/25/15 1619    Visit Number 31   Date for PT Re-Evaluation 12/03/15   PT Start Time 1440   PT Stop Time 1536   PT Time Calculation (min) 56 min   Activity Tolerance Patient limited by pain   Behavior During Therapy [facility] for tasks assessed/performed      Past Medical History  Diagnosis Date  . Hyperlipidemia   . Cystitis 2007    sepsis post bladder biopsy, Dr Annabell Howells  . Headache(784.0) 09-13-11    less frequent to none at present  . TMJ click 09-13-11    right side > left  . Cataracts, both eyes 09-13-11    not surgical yet  . Hypertension 09-13-11    tx. meds  . 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 GERD (gastroesophageal reflux disease) 09-14-11    tx. Nexium  . Fibromyalgia 09-14-11    Dr Dian Queen, [facility]  . Arthritis 09-14-11    osteoarthritis, osteopenia  . Strep throat 09-14-11    none in a yr.  . Fractures 09-14-11    toes-left foot  . Edema 09-14-11    occ. ankles/ feet > left leg  . Heart murmur 09-13-11  . Anxiety   . History of knee replacement 09/21/2011    left  . 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 H/O foot surgery 12/19/2011    Past Surgical History  Procedure Laterality Date  . Tonsillectomy    . Sigmoidoscopy    . Colonoscopy  2011    negative  . Sling procedure  2006    a and p repair  . Nasal sinus surgery  09-14-11    right side " sinus lift"  . Total knee arthroplasty  09/21/2011    Procedure: TOTAL KNEE ARTHROPLASTY;  Surgeon: Loanne Drilling;  [facility];  Service: Orthopedics;  Laterality: Left;  . Foot arthrodesis   01/03/12    [facility]-- left hallux MP joint arthrodesis  . Hammer toe surgery  01/03/12    [facility], Dr Victorino Dike  . Abdominal hysterectomy  1977    dysfunctional menses; age 20  . Appendectomy  1963  . Cystostomy w/ bladder biopsy  04-2006  . Total knee arthroplasty  06/25/2012    Procedure: TOTAL KNEE ARTHROPLASTY;  Surgeon: Loanne Drilling, MD;  [facility];  Service: Orthopedics;  Laterality: Right;    There were no vitals filed for this visit.  Visit Diagnosis:  Left shoulder pain - Plan: PT plan of care cert/re-cert  Neck pain - Plan: PT plan of care cert/re-cert  Shoulder stiffness, left - Plan: PT plan of care cert/re-cert      Subjective Assessment - 11/25/15 1448    Subjective Patient sent back after trying dry needling, as the therapist she was seeing felt that the carryover relief of the treatment.  He also felt that she shoulde see MD.  She just saw the MD today and had a cortisone today and has a new script for frozen shoulder.   Currently in Pain? Yes   Pain Score  3    Pain Location Shoulder   Pain Orientation Left   Pain Radiating Towards numbness in 4th and 5th digits            [facility] PT Assessment - 11/25/15 0001    AROM   Overall AROM Comments Cervical ROM was decreased 25% for flexion and extension as well as right rotation, decreased 50% for left rotation, right side bending was decreaesd 25% with left side bending decreased 50%   Left Shoulder Flexion 90 Degrees   Left Shoulder ABduction 70 Degrees   Left Shoulder Internal Rotation 35 Degrees   Left Shoulder External Rotation 70 Degrees   Palpation   Palpation comment significant spasms in the left upper trap, cervical parapsinals and into the rhomboids, manual cervical distraction decreased the pain   Special Tests    Special Tests --  + impingement and empty can on the left                     [facility] Adult PT Treatment/Exercise - 11/25/15 0001    Modalities   Modalities  Traction   Moist Heat Therapy   Number Minutes Moist Heat 15 Minutes   Moist Heat Location Cervical   Electrical Stimulation   Electrical Stimulation Location left upper trap   Electrical Stimulation Action IFC   Electrical Stimulation Parameters tolerance   Electrical Stimulation Goals Pain                  PT Short Term Goals - 08/28/15 0929    PT SHORT TERM GOAL #1   Title independent with initial HEP   Status Achieved           PT Long Term Goals - 10/13/15 1709    PT LONG TERM GOAL #2   Title decrease pain 50%   Status Partially Met               Plan - 11/25/15 1620    Clinical Impression Statement Patient saw MD today, has a new script with dx of left frozen shoulder, also x-rays showed significan cervical DDD with spurring, she has positive test for shoulder impingement and empty can tests, she responded well with traction today , reporting less pain   PT Next Visit Plan We will start to focus more on the shoulder ROM, and cervical DDD   Consulted and Agree with Plan of Care Patient        Problem List Patient Active Problem List   Diagnosis Date Noted  . OA (osteoarthritis) of knee 06/25/2012  . 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 Osteopenia 03/05/2008  . Hyperlipidemia 12/21/2007  . Essential hypertension 12/21/2007  . GERD 12/21/2007  . CONSTIPATION, CHRONIC 12/21/2007  . FIBROMYALGIA 12/21/2007  . Memory loss 12/21/2007    Sumner Boast., PT 11/25/2015, 4:33 PM  [facility] [place] [address] [place], [place], [address] Phone: [phone]   Fax:  [phone]  Name: Madeline [name] MRN: [id number] Date of Birth: 1946/01/05

## 2015-12-02 ENCOUNTER — Ambulatory Visit: Payer: Medicare Other | Admitting: Physical Therapy

## 2015-12-03 ENCOUNTER — Encounter: Payer: Self-pay | Admitting: Physical Therapy

## 2015-12-03 ENCOUNTER — Ambulatory Visit: Payer: Medicare Other | Admitting: Physical Therapy

## 2015-12-03 DIAGNOSIS — M25512 Pain in left shoulder: Secondary | ICD-10-CM

## 2015-12-03 DIAGNOSIS — M25612 Stiffness of left shoulder, not elsewhere classified: Secondary | ICD-10-CM

## 2015-12-03 DIAGNOSIS — M542 Cervicalgia: Secondary | ICD-10-CM

## 2015-12-03 NOTE — Therapy (Signed)
[place] [place] [place], [place], [address] Phone: [phone]   Fax:  [phone]  Physical Therapy Treatment  Patient Details  Name: Madeline [name] MRN: [id number] Date of Birth: January 12, 1946 No Data Recorded  Encounter Date: 12/03/2015      PT End of Session - 12/03/15 1435    Visit Number 32   Date for PT Re-Evaluation 12/26/15   PT Start Time 1400   PT Stop Time 1455   PT Time Calculation (min) 55 min   Activity Tolerance Patient tolerated treatment well   Behavior During Therapy [facility] for tasks assessed/performed      Past Medical History  Diagnosis Date  . Hyperlipidemia   . Cystitis 2007    sepsis post bladder biopsy, Dr Jeffie Pollock  . Headache(784.0) 09-13-11    less frequent to none at present  . TMJ click 31-49-70    right side > left  . Cataracts, both eyes 09-13-11    not surgical yet  . Hypertension 09-13-11    tx. meds  . 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 GERD (gastroesophageal reflux disease) 09-14-11    tx. Nexium  . Fibromyalgia 09-14-11    Dr Donney Dice, [facility]  . Arthritis 09-14-11    osteoarthritis, osteopenia  . Strep throat 09-14-11    none in a yr.  . Fractures 09-14-11    toes-left foot  . Edema 09-14-11    occ. ankles/ feet > left leg  . Heart murmur 09-13-11  . Anxiety   . History of knee replacement 09/21/2011    left  . 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 H/O foot surgery 12/19/2011    Past Surgical History  Procedure Laterality Date  . Tonsillectomy    . Sigmoidoscopy    . Colonoscopy  2011    negative  . Sling procedure  2006    a and p repair  . Nasal sinus surgery  09-14-11    right side " sinus lift"  . Total knee arthroplasty  09/21/2011    Procedure: TOTAL KNEE ARTHROPLASTY;  Surgeon: Gearlean Alf;  [facility];  Service: Orthopedics;  Laterality: Left;  . Foot  arthrodesis  01/03/12    [place] [facility]-- left hallux MP joint arthrodesis  . Hammer toe surgery  01/03/12    [facility], Dr Doran Durand  . Abdominal hysterectomy  1977    dysfunctional menses; age 6  . Appendectomy  1963  . Cystostomy w/ bladder biopsy  04-2006  . Total knee arthroplasty  06/25/2012    Procedure: TOTAL KNEE ARTHROPLASTY;  Surgeon: Gearlean Alf, MD;  [facility];  Service: Orthopedics;  Laterality: Right;    There were no vitals filed for this visit.  Visit Diagnosis:  Left shoulder pain  Neck pain  Shoulder stiffness, left      Subjective Assessment - 12/03/15 1432    Subjective Patient reports that she feels that the traction did help the pain and decrease the numbness   Currently in Pain? Yes   Pain Score 3    Pain Location Shoulder   Pain Orientation Left                         [facility] Adult PT Treatment/Exercise - 12/03/15 0001    Modalities   Modalities Traction   Moist Heat  Therapy   Number Minutes Moist Heat 15 Minutes   Moist Heat Location Shoulder   Electrical Stimulation   Electrical Stimulation Location left rhomboid area   Electrical Stimulation Action IFC   Electrical Stimulation Parameters tolerance   Electrical Stimulation Goals Pain   Traction   Type of Traction Cervical   Min (lbs) 12   Hold Time static   Time 15 minutes   Manual Therapy   Joint Mobilization grade II/III joint mobs of the left shoulder all motions   Soft tissue mobilization to left upper trap, rhomboid and c-p-spinals   Neural Stretch left UE, some PROM of the left shoulder   Kinesiotex Inhibit Muscle   Kinesiotix   Inhibit Muscle  upper trap and teres                  PT Short Term Goals - 08/28/15 0929    PT SHORT TERM GOAL #1   Title independent with initial HEP   Status Achieved           PT Long Term Goals - 12/03/15 1438    PT LONG TERM GOAL #2   Title decrease pain 50%   Status Partially Met                Plan - 12/03/15 1436    Clinical Impression Statement Patient with some relief with the traction of thecervical spine, tried some contract relax stretching of the left shoulder and seemed to get some good ROM.   PT Next Visit Plan continue with focus on the left shoulder and neck   Consulted and Agree with Plan of Care Patient        Problem List Patient Active Problem List   Diagnosis Date Noted  . OA (osteoarthritis) of knee 06/25/2012  . 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 Osteopenia 03/05/2008  . Hyperlipidemia 12/21/2007  . Essential hypertension 12/21/2007  . GERD 12/21/2007  . CONSTIPATION, CHRONIC 12/21/2007  . FIBROMYALGIA 12/21/2007  . Memory loss 12/21/2007    Sumner Boast., PT 12/03/2015, 2:39 PM  [address] [place], [place], [address] Phone: [phone]   Fax:  [phone]  Name: Madeline [name] MRN: [id number] Date of Birth: [birth year]

## 2015-12-07 ENCOUNTER — Ambulatory Visit: Payer: Medicare Other | Admitting: Physical Therapy

## 2015-12-07 ENCOUNTER — Encounter: Payer: Self-pay | Admitting: Physical Therapy

## 2015-12-07 DIAGNOSIS — M542 Cervicalgia: Secondary | ICD-10-CM

## 2015-12-07 DIAGNOSIS — M25512 Pain in left shoulder: Secondary | ICD-10-CM | POA: Diagnosis not present

## 2015-12-07 DIAGNOSIS — M25612 Stiffness of left shoulder, not elsewhere classified: Secondary | ICD-10-CM

## 2015-12-07 NOTE — Therapy (Signed)
[place], [place], [address] Phone: [phone]   Fax:  [phone]  Physical Therapy Treatment  Patient Details  Name: Madeline [name] MRN: [id number] Date of Birth: 23-Jun-1946 No Data Recorded  Encounter Date: 12/07/2015      PT End of Session - 12/07/15 1130    Visit Number 33   Date for PT Re-Evaluation 12/26/15   PT Start Time 1058   PT Stop Time 1149   PT Time Calculation (min) 51 min   Activity Tolerance Patient tolerated treatment well   Behavior During Therapy [facility] for tasks assessed/performed      Past Medical History  Diagnosis Date  . Hyperlipidemia   . Cystitis 2007    sepsis post bladder biopsy, Dr Jeffie Pollock  . Headache(784.0) 09-13-11    less frequent to none at present  . TMJ click 03-19-15    right side > left  . Cataracts, both eyes 09-13-11    not surgical yet  . Hypertension 09-13-11    tx. meds  . 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 GERD (gastroesophageal reflux disease) 09-14-11    tx. Nexium  . Fibromyalgia 09-14-11    Dr Donney Dice, [facility]  . Arthritis 09-14-11    osteoarthritis, osteopenia  . Strep throat 09-14-11    none in a yr.  . Fractures 09-14-11    toes-left foot  . Edema 09-14-11    occ. ankles/ feet > left leg  . Heart murmur 09-13-11  . Anxiety   . History of knee replacement 09/21/2011    left  . 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 H/O foot surgery 12/19/2011    Past Surgical History  Procedure Laterality Date  . Tonsillectomy    . Sigmoidoscopy    . Colonoscopy  2011    negative  . Sling procedure  2006    a and p repair  . Nasal sinus surgery  09-14-11    right side " sinus lift"  . Total knee arthroplasty  09/21/2011    Procedure: TOTAL KNEE ARTHROPLASTY;  Surgeon: Gearlean Alf;  [facility];  Service: Orthopedics;  Laterality: Left;  . Foot  arthrodesis  01/03/12    [place] [facility]-- left hallux MP joint arthrodesis  . Hammer toe surgery  01/03/12    [facility], Dr Doran Durand  . Abdominal hysterectomy  1977    dysfunctional menses; age 59  . Appendectomy  1963  . Cystostomy w/ bladder biopsy  04-2006  . Total knee arthroplasty  06/25/2012    Procedure: TOTAL KNEE ARTHROPLASTY;  Surgeon: Gearlean Alf, MD;  [facility];  Service: Orthopedics;  Laterality: Right;    There were no vitals filed for this visit.  Visit Diagnosis:  Left shoulder pain  Neck pain  Shoulder stiffness, left      Subjective Assessment - 12/07/15 1101    Subjective Reports increased shoulder pain after stretching the arm last week.  She will have an MRI on the 28th.     Currently in Pain? Yes   Pain Score 4    Pain Location Shoulder   Pain Orientation Left   Pain Descriptors / Indicators Aching;Tightness                         [facility] Adult PT Treatment/Exercise - 12/07/15 0001  Shoulder Exercises: Therapy Ball   Flexion 20 reps   Other Therapy Ball Exercises head on ball cervical isometric retractions.   Shoulder Exercises: ROM/Strengthening   UBE (Upper Arm Bike) 2 fwd/2back  L 3.0    "W" Arms 15 reps   Shoulder Exercises: Stretch   Corner Stretch 10 seconds;3 reps   Moist Heat Therapy   Number Minutes Moist Heat 15 Minutes   Moist Heat Location Shoulder   Electrical Stimulation   Electrical Stimulation Location left rhomboid area   Electrical Stimulation Action IFC   Electrical Stimulation Parameters tolerance   Electrical Stimulation Goals Pain   Traction   Type of Traction Cervical   Min (lbs) 12   Hold Time static   Time 15 minutes   Manual Therapy   Joint Mobilization grade II/III joint mobs of the left shoulder all motions   Neural Stretch left UE, some PROM of the left shoulder                  PT Short Term Goals - 08/28/15 0929    PT SHORT TERM GOAL #1   Title independent with initial  HEP   Status Achieved           PT Long Term Goals - 12/03/15 1438    PT LONG TERM GOAL #2   Title decrease pain 50%   Status Partially Met               Plan - 12/07/15 1132    Clinical Impression Statement Patient saw MD, they have an MRI scheduled for 3/28.  He recommended continue current plan of PT.  He felt like the neck was "in bad shape".  She reports that the traction is relieving her radicular symptoms.   PT Next Visit Plan continue with focus on the left shoulder and neck   Consulted and Agree with Plan of Care Patient        Problem List Patient Active Problem List   Diagnosis Date Noted  . OA (osteoarthritis) of knee 06/25/2012  . 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 Osteopenia 03/05/2008  . Hyperlipidemia 12/21/2007  . Essential hypertension 12/21/2007  . GERD 12/21/2007  . CONSTIPATION, CHRONIC 12/21/2007  . FIBROMYALGIA 12/21/2007  . Memory loss 12/21/2007    Sumner Boast., PT 12/07/2015, 11:34 AM  [address] [place], [place], [address] Phone: [phone]   Fax:  [phone]  Name: Madeline [name] MRN: [id number] Date of Birth: 1945-11-19

## 2015-12-09 ENCOUNTER — Ambulatory Visit: Payer: Medicare Other | Admitting: Physical Therapy

## 2015-12-09 ENCOUNTER — Encounter: Payer: Self-pay | Admitting: Physical Therapy

## 2015-12-09 DIAGNOSIS — M25512 Pain in left shoulder: Secondary | ICD-10-CM | POA: Diagnosis not present

## 2015-12-09 DIAGNOSIS — M25612 Stiffness of left shoulder, not elsewhere classified: Secondary | ICD-10-CM

## 2015-12-09 DIAGNOSIS — M542 Cervicalgia: Secondary | ICD-10-CM

## 2015-12-09 NOTE — Therapy (Signed)
[place], [place], [address] Phone: [phone]   Fax:  [phone]  Physical Therapy Treatment  Patient Details  Name: Madeline [name] MRN: [id number] Date of Birth: [birth year] No Data Recorded  Encounter Date: 12/09/2015      PT End of Session - 12/09/15 1146    Visit Number 34   Date for PT Re-Evaluation 12/26/15   PT Start Time 1059   PT Stop Time 1155   PT Time Calculation (min) 56 min   Activity Tolerance Patient tolerated treatment well   Behavior During Therapy [facility] for tasks assessed/performed      Past Medical History  Diagnosis Date  . Hyperlipidemia   . Cystitis 2007    sepsis post bladder biopsy, Dr Jeffie Pollock  . Headache(784.0) 09-13-11    less frequent to none at present  . TMJ click 60-63-01    right side > left  . Cataracts, both eyes 09-13-11    not surgical yet  . Hypertension 09-13-11    tx. meds  . 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 GERD (gastroesophageal reflux disease) 09-14-11    tx. Nexium  . Fibromyalgia 09-14-11    Dr Donney Dice, [facility]  . Arthritis 09-14-11    osteoarthritis, osteopenia  . Strep throat 09-14-11    none in a yr.  . Fractures 09-14-11    toes-left foot  . Edema 09-14-11    occ. ankles/ feet > left leg  . Heart murmur 09-13-11  . Anxiety   . History of knee replacement 09/21/2011    left  . 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 H/O foot surgery 12/19/2011    Past Surgical History  Procedure Laterality Date  . Tonsillectomy    . Sigmoidoscopy    . Colonoscopy  2011    negative  . Sling procedure  2006    a and p repair  . Nasal sinus surgery  09-14-11    right side " sinus lift"  . Total knee arthroplasty  09/21/2011    Procedure: TOTAL KNEE ARTHROPLASTY;  Surgeon: Gearlean Alf;  [facility];  Service: Orthopedics;  Laterality: Left;  . Foot  arthrodesis  01/03/12    [place] [facility]-- left hallux MP joint arthrodesis  . Hammer toe surgery  01/03/12    [facility], Dr Doran Durand  . Abdominal hysterectomy  1977    dysfunctional menses; age 30  . Appendectomy  1963  . Cystostomy w/ bladder biopsy  04-2006  . Total knee arthroplasty  06/25/2012    Procedure: TOTAL KNEE ARTHROPLASTY;  Surgeon: Gearlean Alf, MD;  [facility];  Service: Orthopedics;  Laterality: Right;    There were no vitals filed for this visit.  Visit Diagnosis:  Left shoulder pain  Neck pain  Shoulder stiffness, left      Subjective Assessment - 12/09/15 1058    Subjective Reports that she was feeling pretty good after last treatment, reports that yesterday she cleaned her porch and did a lot of vacuuming, now with a little more tightness and pain   Currently in Pain? Yes   Pain Score 4    Pain Location Shoulder   Pain Orientation Left   Pain Descriptors / Indicators Aching            [facility] PT Assessment - 12/09/15 0001    AROM  Overall AROM Comments cervical flexion and extension were WNL's,    Left Shoulder Flexion 110 Degrees   Left Shoulder ABduction 93 Degrees   Left Shoulder Internal Rotation 45 Degrees   Left Shoulder External Rotation 80 Degrees                     [facility] Adult PT Treatment/Exercise - 12/09/15 0001    Shoulder Exercises: Therapy Ball   Flexion 20 reps   Other Therapy Ball Exercises head on ball cervical isometric retractions.   Shoulder Exercises: ROM/Strengthening   UBE (Upper Arm Bike) 2 fwd/2back  L 3.0    "W" Arms 15 reps   Shoulder Exercises: IT sales professional 10 seconds;3 reps   Traction   Type of Traction Cervical   Min (lbs) 13   Hold Time static   Time 15 minutes   Manual Therapy   Joint Mobilization grade II/III joint mobs of the left shoulder all motions   Soft tissue mobilization left upper trap, cervical area and left rhomboid   Neural Stretch left UE, some PROM of the left  shoulder   Kinesiotex Inhibit Muscle                  PT Short Term Goals - 08/28/15 0929    PT SHORT TERM GOAL #1   Title independent with initial HEP   Status Achieved           PT Long Term Goals - 12/09/15 1148    PT LONG TERM GOAL #1   Title understand proper posture and body mechanics   Status Achieved   PT LONG TERM GOAL #2   Title decrease pain 50%   Status Partially Met   PT LONG TERM GOAL #3   Title increase left shoulder AROM to 150 degrees flexion   Status On-going   PT LONG TERM GOAL #5   Title dress and do hair without difficulty   Status Achieved               Plan - 12/09/15 1146    Clinical Impression Statement Patient has increased ROM of the shoulder and cervical area.  Is allowing PROM without resistance and gaurding today.  She reports that the traction does seem to give her relief for a day or two   PT Next Visit Plan We will continue through next week, she is planning on an extended vacation and want to not have issues   Consulted and Agree with Plan of Care Patient        Problem List Patient Active Problem List   Diagnosis Date Noted  . OA (osteoarthritis) of knee 06/25/2012  . 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 Osteopenia 03/05/2008  . Hyperlipidemia 12/21/2007  . Essential hypertension 12/21/2007  . GERD 12/21/2007  . CONSTIPATION, CHRONIC 12/21/2007  . FIBROMYALGIA 12/21/2007  . Memory loss 12/21/2007    Sumner Boast., PT 12/09/2015, 11:51 AM  [place] [place], [place], [address] Phone: [phone]   Fax:  [phone]  Name: Madeline [name] MRN: [id number] Date of Birth: [birth year]

## 2015-12-14 ENCOUNTER — Ambulatory Visit: Payer: Medicare Other | Admitting: Physical Therapy

## 2015-12-14 ENCOUNTER — Encounter: Payer: Self-pay | Admitting: Physical Therapy

## 2015-12-14 DIAGNOSIS — M25512 Pain in left shoulder: Secondary | ICD-10-CM | POA: Diagnosis not present

## 2015-12-14 DIAGNOSIS — M542 Cervicalgia: Secondary | ICD-10-CM

## 2015-12-14 DIAGNOSIS — M25612 Stiffness of left shoulder, not elsewhere classified: Secondary | ICD-10-CM

## 2015-12-14 NOTE — Therapy (Signed)
[place], [place], [address] Phone: [phone]   Fax:  [phone]  Physical Therapy Treatment  Patient Details  Name: Madeline [name] MRN: [id number] Date of Birth: 15-Apr-1946 No Data Recorded  Encounter Date: 12/14/2015      PT End of Session - 12/14/15 1145    Visit Number 35   Date for PT Re-Evaluation 12/26/15   PT Start Time 1106   PT Stop Time 1155   PT Time Calculation (min) 49 min   Activity Tolerance Patient tolerated treatment well   Behavior During Therapy [facility] for tasks assessed/performed      Past Medical History  Diagnosis Date  . Hyperlipidemia   . Cystitis 2007    sepsis post bladder biopsy, Dr Jeffie Pollock  . Headache(784.0) 09-13-11    less frequent to none at present  . TMJ click 93-23-55    right side > left  . Cataracts, both eyes 09-13-11    not surgical yet  . Hypertension 09-13-11    tx. meds  . 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 GERD (gastroesophageal reflux disease) 09-14-11    tx. Nexium  . Fibromyalgia 09-14-11    Dr Donney Dice, [facility]  . Arthritis 09-14-11    osteoarthritis, osteopenia  . Strep throat 09-14-11    none in a yr.  . Fractures 09-14-11    toes-left foot  . Edema 09-14-11    occ. ankles/ feet > left leg  . Heart murmur 09-13-11  . Anxiety   . History of knee replacement 09/21/2011    left  . 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 H/O foot surgery 12/19/2011    Past Surgical History  Procedure Laterality Date  . Tonsillectomy    . Sigmoidoscopy    . Colonoscopy  2011    negative  . Sling procedure  2006    a and p repair  . Nasal sinus surgery  09-14-11    right side " sinus lift"  . Total knee arthroplasty  09/21/2011    Procedure: TOTAL KNEE ARTHROPLASTY;  Surgeon: Gearlean Alf;  [facility];  Service: Orthopedics;  Laterality: Left;  . Foot  arthrodesis  01/03/12    [place] [facility]-- left hallux MP joint arthrodesis  . Hammer toe surgery  01/03/12    [facility], Dr Doran Durand  . Abdominal hysterectomy  1977    dysfunctional menses; age 48  . Appendectomy  1963  . Cystostomy w/ bladder biopsy  04-2006  . Total knee arthroplasty  06/25/2012    Procedure: TOTAL KNEE ARTHROPLASTY;  Surgeon: Gearlean Alf, MD;  [facility];  Service: Orthopedics;  Laterality: Right;    There were no vitals filed for this visit.  Visit Diagnosis:  Left shoulder pain  Neck pain  Shoulder stiffness, left      Subjective Assessment - 12/14/15 1105    Subjective Still working on that porch and I am sore, doing some overhead stuff to clean the cobwebs.   Currently in Pain? Yes   Pain Score 5    Pain Location Shoulder   Pain Orientation Left   Pain Descriptors / Indicators Aching;Spasm   Aggravating Factors  cleaning the house   Pain Relieving Factors the traction is helping some  [facility] Adult PT Treatment/Exercise - 12/14/15 0001    Moist Heat Therapy   Number Minutes Moist Heat 15 Minutes   Moist Heat Location Shoulder   Electrical Stimulation   Electrical Stimulation Location left rhomboid area   Electrical Stimulation Action IFC   Electrical Stimulation Parameters tolerance   Electrical Stimulation Goals Pain   Traction   Type of Traction Cervical   Min (lbs) 15   Hold Time static   Time 15 minutes   Manual Therapy   Joint Mobilization grade II/III joint mobs of the left shoulder all motions   Soft tissue mobilization left upper trap, cervical area and left rhomboid   Neural Stretch left UE, some PROM of the left shoulder   Kinesiotex Inhibit Muscle   Kinesiotix   Inhibit Muscle  upper trap and teres                  PT Short Term Goals - 08/28/15 0929    PT SHORT TERM GOAL #1   Title independent with initial HEP   Status Achieved           PT Long Term Goals -  12/09/15 1148    PT LONG TERM GOAL #1   Title understand proper posture and body mechanics   Status Achieved   PT LONG TERM GOAL #2   Title decrease pain 50%   Status Partially Met   PT LONG TERM GOAL #3   Title increase left shoulder AROM to 150 degrees flexion   Status On-going   PT LONG TERM GOAL #5   Title dress and do hair without difficulty   Status Achieved               Plan - 12/14/15 1146    Clinical Impression Statement Patient is doing more at home, she reports that she would not have been able to do as much previously due to pain prior to starting PT, she seems pleased that she is able to do these things but also frustrated that she has increased pain after.   PT Next Visit Plan We will continue through next week, she is planning on an extended vacation and want to not have issues   Consulted and Agree with Plan of Care Patient        Problem List Patient Active Problem List   Diagnosis Date Noted  . OA (osteoarthritis) of knee 06/25/2012  . 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 Osteopenia 03/05/2008  . Hyperlipidemia 12/21/2007  . Essential hypertension 12/21/2007  . GERD 12/21/2007  . CONSTIPATION, CHRONIC 12/21/2007  . FIBROMYALGIA 12/21/2007  . Memory loss 12/21/2007    Sumner Boast., PT 12/14/2015, 11:48 AM  [address] [place], [place], [address] Phone: [phone]   Fax:  [phone]  Name: Madeline [name] MRN: [id number] Date of Birth: 09-Apr-1946

## 2015-12-16 ENCOUNTER — Ambulatory Visit: Payer: Medicare Other | Admitting: Physical Therapy

## 2015-12-16 ENCOUNTER — Encounter: Payer: Self-pay | Admitting: Physical Therapy

## 2015-12-16 DIAGNOSIS — M25512 Pain in left shoulder: Secondary | ICD-10-CM

## 2015-12-16 DIAGNOSIS — M542 Cervicalgia: Secondary | ICD-10-CM

## 2015-12-16 DIAGNOSIS — M25612 Stiffness of left shoulder, not elsewhere classified: Secondary | ICD-10-CM

## 2015-12-16 NOTE — Therapy (Signed)
[address] [place], [place], [address] Phone: [phone]   Fax:  [phone]  Physical Therapy Treatment  Patient Details  Name: Madeline [name] MRN: [id number] Date of Birth: January 03, 1946 No Data Recorded  Encounter Date: 12/16/2015      PT End of Session - 12/16/15 1144    Visit Number 36   Date for PT Re-Evaluation 12/26/15   PT Start Time 1100   PT Stop Time 1155   PT Time Calculation (min) 55 min   Activity Tolerance Patient tolerated treatment well   Behavior During Therapy [facility] for tasks assessed/performed      Past Medical History  Diagnosis Date  . Hyperlipidemia   . Cystitis 2007    sepsis post bladder biopsy, Dr Jeffie Pollock  . Headache(784.0) 09-13-11    less frequent to none at present  . TMJ click [id number]    right side > left  . Cataracts, both eyes 09-13-11    not surgical yet  . Hypertension 09-13-11    tx. meds  . 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 GERD (gastroesophageal reflux disease) 09-14-11    tx. Nexium  . Fibromyalgia 09-14-11    Dr Donney Dice, [facility]  . Arthritis 09-14-11    osteoarthritis, osteopenia  . Strep throat 09-14-11    none in a yr.  . Fractures 09-14-11    toes-left foot  . Edema 09-14-11    occ. ankles/ feet > left leg  . Heart murmur 09-13-11  . Anxiety   . History of knee replacement 09/21/2011    left  . 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 H/O foot surgery 12/19/2011    Past Surgical History  Procedure Laterality Date  . Tonsillectomy    . Sigmoidoscopy    . Colonoscopy  2011    negative  . Sling procedure  2006    a and p repair  . Nasal sinus surgery  09-14-11    right side " sinus lift"  . Total knee arthroplasty  09/21/2011    Procedure: TOTAL KNEE ARTHROPLASTY;  Surgeon: Gearlean Alf;  Location: [facility];  Service: Orthopedics;  Laterality: Left;  . Foot  arthrodesis  01/03/12    [place] [facility]-- left hallux MP joint arthrodesis  . Hammer toe surgery  01/03/12    [facility], Dr Doran Durand  . Abdominal hysterectomy  1977    dysfunctional menses; age 70  . Appendectomy  1963  . Cystostomy w/ bladder biopsy  04-2006  . Total knee arthroplasty  06/25/2012    Procedure: TOTAL KNEE ARTHROPLASTY;  Surgeon: Gearlean Alf, MD;  Location: [facility];  Service: Orthopedics;  Laterality: Right;    There were no vitals filed for this visit.  Visit Diagnosis:  Left shoulder pain  Neck pain  Shoulder stiffness, left      Subjective Assessment - 12/16/15 1142    Subjective Patient reports feeling much better after the last treatment.   Currently in Pain? Yes   Pain Score 2    Pain Location Shoulder   Pain Orientation Left   Pain Descriptors / Indicators Sore            [facility] PT Assessment - 12/16/15 0001    AROM   Left Shoulder Flexion 115 Degrees   Left Shoulder ABduction 100 Degrees   Left Shoulder Internal Rotation 49 Degrees  Left Shoulder External Rotation 82 Degrees                     [facility] Adult PT Treatment/Exercise - 12/16/15 0001    Moist Heat Therapy   Number Minutes Moist Heat 15 Minutes   Moist Heat Location Shoulder   Electrical Stimulation   Electrical Stimulation Location left rhomboid area   Electrical Stimulation Action IFC   Electrical Stimulation Parameters tolerance   Electrical Stimulation Goals Pain   Traction   Type of Traction Cervical   Min (lbs) 15   Hold Time static   Time 15 minutes   Manual Therapy   Joint Mobilization grade II/III joint mobs of the left shoulder all motions   Soft tissue mobilization left upper trap, cervical area and left rhomboid   Neural Stretch left UE, some PROM of the left shoulder   Kinesiotex Inhibit Muscle   Kinesiotix   Inhibit Muscle  upper trap and teres                  PT Short Term Goals - 08/28/15 0929    PT SHORT TERM GOAL #1   Title  independent with initial HEP   Status Achieved           PT Long Term Goals - 12/09/15 1148    PT LONG TERM GOAL #1   Title understand proper posture and body mechanics   Status Achieved   PT LONG TERM GOAL #2   Title decrease pain 50%   Status Partially Met   PT LONG TERM GOAL #3   Title increase left shoulder AROM to 150 degrees flexion   Status On-going   PT LONG TERM GOAL #5   Title dress and do hair without difficulty   Status Achieved               Plan - 12/16/15 1144    Clinical Impression Statement Patient with lesspain today, demonstrating increased shoulder ROM.  Still with knots and spasms in th eleft upper trap, rhomboid and cervical area   PT Next Visit Plan We will continue through next week, she is planning on an extended vacation and want to not have issues   Consulted and Agree with Plan of Care Patient        Problem List Patient Active Problem List   Diagnosis Date Noted  . OA (osteoarthritis) of knee 06/25/2012  . 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 Osteopenia 03/05/2008  . Hyperlipidemia 12/21/2007  . Essential hypertension 12/21/2007  . GERD 12/21/2007  . CONSTIPATION, CHRONIC 12/21/2007  . FIBROMYALGIA 12/21/2007  . Memory loss 12/21/2007    Sumner Boast., PT 12/16/2015, 11:51 AM  [address] [place], [place], [address] Phone: [phone]   Fax:  [phone]  Name: Madeline [name] MRN: [id number] Date of Birth: 06-23-1946

## 2015-12-21 ENCOUNTER — Ambulatory Visit: Payer: Medicare Other | Attending: Internal Medicine | Admitting: Physical Therapy

## 2015-12-21 ENCOUNTER — Encounter: Payer: Self-pay | Admitting: Physical Therapy

## 2015-12-21 DIAGNOSIS — M25612 Stiffness of left shoulder, not elsewhere classified: Secondary | ICD-10-CM | POA: Insufficient documentation

## 2015-12-21 DIAGNOSIS — M25512 Pain in left shoulder: Secondary | ICD-10-CM | POA: Diagnosis present

## 2015-12-21 DIAGNOSIS — M542 Cervicalgia: Secondary | ICD-10-CM | POA: Insufficient documentation

## 2015-12-21 NOTE — Therapy (Signed)
[facility] [address] [place], , [address] Phone: [phone]   Fax:  [phone]  Physical Therapy Treatment  Patient Details  Name: Madeline [name] MRN: [id number] Date of Birth: 01/12/1946 No Data Recorded  Encounter Date: 12/21/2015      PT End of Session - 12/21/15 1058    Visit Number 37   Date for PT Re-Evaluation 12/26/15   PT Start Time 1014   PT Stop Time 1115   PT Time Calculation (min) 61 min   Activity Tolerance Patient tolerated treatment well   Behavior During Therapy WFL for tasks assessed/performed      Past Medical History  Diagnosis Date  . Hyperlipidemia   . Cystitis 2007    sepsis post bladder biopsy, Dr Wrenn  . Headache(784.0) 09-13-11    less frequent to none at present  . TMJ click 09-13-11    right side > left  . Cataracts, both eyes 09-13-11    not surgical yet  . Hypertension 09-13-11    tx. meds  . 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 GERD (gastroesophageal reflux disease) 09-14-11    tx. Nexium  . Fibromyalgia 09-14-11    Dr Wasserman/Dr Caldwell, [facility]  . Arthritis 09-14-11    osteoarthritis, osteopenia  . Strep throat 09-14-11    none in a yr.  . Fractures 09-14-11    toes-left foot  . Edema 09-14-11    occ. ankles/ feet > left leg  . Heart murmur 09-13-11  . Anxiety   . History of knee replacement 09/21/2011    left  . 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 H/O foot surgery 12/19/2011    Past Surgical History  Procedure Laterality Date  . Tonsillectomy    . Sigmoidoscopy    . Colonoscopy  2011    negative  . Sling procedure  2006    a and p repair  . Nasal sinus surgery  09-14-11    right side " sinus lift"  . Total knee arthroplasty  09/21/2011    Procedure: TOTAL KNEE ARTHROPLASTY;  Surgeon: Frank V Aluisio;  [facility];  Service: Orthopedics;  Laterality: Left;  . Foot  arthrodesis  01/03/12    [place] [facility]-- left hallux MP joint arthrodesis  . Hammer toe surgery  01/03/12    [facility], Dr Hewitt  . Abdominal hysterectomy  1977    dysfunctional menses; age 30  . Appendectomy  1963  . Cystostomy w/ bladder biopsy  04-2006  . Total knee arthroplasty  06/25/2012    Procedure: TOTAL KNEE ARTHROPLASTY;  Surgeon: Frank V Aluisio, MD;  [facility];  Service: Orthopedics;  Laterality: Right;    There were no vitals filed for this visit.  Visit Diagnosis:  Left shoulder pain  Neck pain  Shoulder stiffness, left      Subjective Assessment - 12/21/15 1056    Subjective Patient is doing very well today, she does report being active over the weekend with soreness at night   Currently in Pain? Yes   Pain Score 2    Pain Location Neck   Pain Orientation Left            [facility] PT Assessment - 12/21/15 0001    AROM   Left Shoulder Flexion 123 Degrees   Left Shoulder ABduction 112 Degrees   Left Shoulder Internal Rotation   55 Degrees   Left Shoulder External Rotation 85 Degrees                     [facility] Adult PT Treatment/Exercise - 12/21/15 0001    Moist Heat Therapy   Number Minutes Moist Heat 15 Minutes   Moist Heat Location Shoulder   Electrical Stimulation   Electrical Stimulation Location left rhomboid area   Electrical Stimulation Action IFC   Electrical Stimulation Parameters tolerance   Electrical Stimulation Goals Pain   Traction   Type of Traction Cervical   Min (lbs) 15   Hold Time static   Time 15 minutes   Manual Therapy   Joint Mobilization grade II/III joint mobs of the left shoulder all motions   Soft tissue mobilization left upper trap, cervical area and left rhomboid   Neural Stretch left UE, some PROM of the left shoulder   Kinesiotex Inhibit Muscle   Kinesiotix   Inhibit Muscle  upper trap and teres                  PT Short Term Goals - 08/28/15 0929    PT SHORT TERM GOAL #1   Title  independent with initial HEP   Status Achieved           PT Long Term Goals - 12/21/15 1112    PT LONG TERM GOAL #1   Title understand proper posture and body mechanics   Status Achieved   PT LONG TERM GOAL #2   Title decrease pain 50%   Status Achieved   PT LONG TERM GOAL #3   Title increase left shoulder AROM to 150 degrees flexion   Status Partially Met   PT LONG TERM GOAL #4   Title increase left shoulder AROM of IR to 65 degrees   Status Partially Met   PT LONG TERM GOAL #5   Title dress and do hair without difficulty   Status Achieved               Plan - 12/21/15 1103    Clinical Impression Statement Patient has had decreased pain, increased ROM of the left shoulder and overall increased function with decreased difficulty with pain and ROM.  She will be on vacation and will try to focus on her ROM and function.  I gave her the exercises to do at home   PT Next Visit Plan D/C with her doing HEP   Consulted and Agree with Plan of Care Patient        Problem List Patient Active Problem List   Diagnosis Date Noted  . OA (osteoarthritis) of knee 06/25/2012  . 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 Osteopenia 03/05/2008  . Hyperlipidemia 12/21/2007  . Essential hypertension 12/21/2007  . GERD 12/21/2007  . CONSTIPATION, CHRONIC 12/21/2007  . FIBROMYALGIA 12/21/2007  . Memory loss 12/21/2007    Sumner Boast., PT 12/21/2015, 11:14 AM  [address] [place], [place], [address] Phone: [phone]   Fax:  [phone]  Name: Madeline [name] MRN: [id number] Date of Birth: 01-20-1946

## 2016-01-08 ENCOUNTER — Ambulatory Visit (INDEPENDENT_AMBULATORY_CARE_PROVIDER_SITE_OTHER): Payer: Medicare Other | Admitting: Family

## 2016-01-08 ENCOUNTER — Encounter: Payer: Self-pay | Admitting: Family

## 2016-01-08 VITALS — BP 136/86 | HR 86 | Temp 98.8°F | Resp 18 | Ht 67.5 in | Wt 192.2 lb

## 2016-01-08 DIAGNOSIS — J209 Acute bronchitis, unspecified: Secondary | ICD-10-CM

## 2016-01-08 DIAGNOSIS — R3129 Other microscopic hematuria: Secondary | ICD-10-CM

## 2016-01-08 MED ORDER — BENZONATATE 100 MG PO CAPS: 100.0000 mg | ORAL_CAPSULE | Freq: Three times a day (TID) | ORAL | Status: DC | PRN

## 2016-01-08 MED ORDER — AZITHROMYCIN 250 MG PO TABS: ORAL_TABLET | ORAL | Status: DC

## 2016-01-08 NOTE — Progress Notes (Signed)
Subjective:    Patient ID: Madeline [name], female    DOB: May 23, 1946, 70 y.o.   MRN: [id number]  HPI  Ms. Duquaine is a 70 yr old female who presents today with chief complaint of cough.  She reports that there cough has been present for approximately 1 week. Last week her symptoms were mainly HA/sinus congestion which have resolved but now cough has worsened. Cough has been intermittently productive of green sputum. Denies fever. Energy is poor.   Review of Systems See HPI  Past Medical History  Diagnosis Date  . Hyperlipidemia   . Cystitis 2007    sepsis post bladder biopsy, Dr Jeffie Pollock  . Headache(784.0) 09-13-11    less frequent to none at present  . TMJ click [id number]    right side > left  . Cataracts, both eyes 09-13-11    not surgical yet  . Hypertension 09-13-11    tx. meds  . 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 GERD (gastroesophageal reflux disease) 09-14-11    tx. Nexium  . Fibromyalgia 09-14-11    Dr Donney Dice, [facility]  . Arthritis 09-14-11    osteoarthritis, osteopenia  . Strep throat 09-14-11    none in a yr.  . Fractures 09-14-11    toes-left foot  . Edema 09-14-11    occ. ankles/ feet > left leg  . Heart murmur 09-13-11  . Anxiety   . History of knee replacement 09/21/2011    left  . 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 H/O foot surgery 12/19/2011     Social History   Social History  . Marital Status: Married    Spouse Name: N/A  . Number of Children: N/A  . Years of Education: N/A   Occupational History  . Not on file.   Social History Main Topics  . Smoking status: Never Smoker   . Smokeless tobacco: Never Used  . Alcohol Use: Yes     Comment: rarely  . Drug Use: No  . Sexual Activity: No   Other Topics Concern  . Not on file   Social History Narrative   No reg exercise    Past Surgical History  Procedure Laterality Date  . Tonsillectomy    .  Sigmoidoscopy    . Colonoscopy  2011    negative  . Sling procedure  2006    a and p repair  . Nasal sinus surgery  09-14-11    right side " sinus lift"  . Total knee arthroplasty  09/21/2011    Procedure: TOTAL KNEE ARTHROPLASTY;  Surgeon: Gearlean Alf;  [facility];  Service: Orthopedics;  Laterality: Left;  . Foot arthrodesis  01/03/12    [facility]-- left hallux MP joint arthrodesis  . Hammer toe surgery  01/03/12    [facility], Dr Doran Durand  . Abdominal hysterectomy  1977    dysfunctional menses; age 41  . Appendectomy  1963  . Cystostomy w/ bladder biopsy  04-2006  . Total knee arthroplasty  06/25/2012    Procedure: TOTAL KNEE ARTHROPLASTY;  Surgeon: Gearlean Alf, MD;  [facility];  Service: Orthopedics;  Laterality: Right;    Family History  Problem Relation Age of Onset  . Mental illness Brother     paranoid schizophrenia  . Heart attack      both GF;PGM; M uncle  . Stroke Paternal Aunt  aneurysm  . Colon cancer Paternal Aunt     X 2  . Other Mother     lung tumor which resolved w/o treatment  . Hypertension Mother   . Aortic aneurysm Father     femoral aneurysm  . Diabetes Neg Hx   . Other Maternal Grandmother     enalraged heart  . Heart attack Maternal Grandfather     In fifties, died.  Marland Kitchen Heart attack Paternal Grandmother     In forties.  Marland Kitchen Heart attack Paternal Grandfather     Allergies  Allergen Reactions  . Ivp Dye [Iodinated Diagnostic Agents]     Rash , hives  . Codeine Itching    Unless something given to counteract itching    Current Outpatient Prescriptions on File Prior to Visit  Medication Sig Dispense Refill  . aspirin EC 81 MG tablet Take 81 mg by mouth daily.    Marland Kitchen CALCIUM PO Take by mouth daily.    . Cholecalciferol (VITAMIN D3) 2000 UNITS TABS Take by mouth. Take 4000 units Daily and 6000 on Sunday.    . cyclobenzaprine (FLEXERIL) 10 MG tablet Take 20 mg by mouth at bedtime.    Marland Kitchen diltiazem (DILACOR XR) 180 MG 24 hr  capsule Take 1 capsule (180 mg total) by mouth at bedtime. 90 capsule 3  . DULoxetine (CYMBALTA) 60 MG capsule Take 60 mg by mouth daily.    . fosinopril (MONOPRIL) 10 MG tablet Take 1 tablet (10 mg total) by mouth at bedtime. 90 tablet 3  . meloxicam (MOBIC) 7.5 MG tablet Take 7.5 mg by mouth daily. Every morning    . Multiple Vitamin (MULTI-DAY PO) Take by mouth daily.    Marland Kitchen oxyCODONE (OXY IR/ROXICODONE) 5 MG immediate release tablet Take 1 tablet by mouth every 6 (six) hours as needed.    . pravastatin (PRAVACHOL) 40 MG tablet Take 1 tablet (40 mg total) by mouth at bedtime. 90 tablet 3  . pregabalin (LYRICA) 75 MG capsule Take 75 mg by mouth at bedtime. Pt wants no generic    . ranitidine (ZANTAC) 150 MG capsule Take 150 mg by mouth daily.    . tizanidine (ZANAFLEX) 2 MG capsule Take 4 mg by mouth at bedtime.    . traMADol (ULTRAM) 50 MG tablet Take 2 tablets by mouth every 6 (six) hours as needed. Not to exceed 6 tablets a day     No current facility-administered medications on file prior to visit.    BP 136/86 mmHg  Pulse 86  Temp(Src) 98.8 F (37.1 C) (Oral)  Resp 18  Ht 5' 7.5" (1.715 m)  Wt 192 lb 3.2 oz (87.181 kg)  BMI 29.64 kg/m2  SpO2 97%       Objective:   Physical Exam  Constitutional: She appears well-developed and well-nourished.  HENT:  Head: Normocephalic and atraumatic.  Right Ear: Tympanic membrane and ear canal normal.  Left Ear: Tympanic membrane and ear canal normal.  Mouth/Throat: No oropharyngeal exudate, posterior oropharyngeal edema or posterior oropharyngeal erythema.  Cardiovascular: Normal rate, regular rhythm and normal heart sounds.   No murmur heard. Pulmonary/Chest: Effort normal and breath sounds normal. No respiratory distress. She has no wheezes.  Psychiatric: She has a normal mood and affect. Her behavior is normal. Judgment and thought content normal.          Assessment & Plan:  Bronchitis- new.  rx with zpak and tessalon.  Pt is  advised to call if symptoms worsen or if symptoms  are not improved in 2-3 days.

## 2016-01-08 NOTE — Patient Instructions (Signed)
Star zpak for bronchitis. You may use tessalon as needed for cough. Call is you develop fever >101, if symptoms worsen or if not improved in 3 days. Have a nice trip to [place]

## 2016-01-08 NOTE — Progress Notes (Signed)
Pre visit review using our clinic review tool, if applicable. No additional management support is needed unless otherwise documented below in the visit note. 

## 2016-01-13 ENCOUNTER — Telehealth: Payer: Self-pay | Admitting: Family Medicine

## 2016-01-13 NOTE — Telephone Encounter (Signed)
Called her back- she just finished her zpack yesterday.  She is concerned because she is still coughing. No fevers.  Reassured her that it is not unusual for a cough to last several weeks and I would not recommend another abx for the time being. She is going to [place] next week and wants to be sure that she is well.  We will make an appt to see her on Friday, I spoke with [facility]

## 2016-01-13 NOTE — Telephone Encounter (Signed)
Relation to [id number] Call back number:[phone] Pharmacy: [place], [place] - [place] [phone] (Phone) [phone] (Fax)         Reason for call:  Patient states cough symptoms have not improved since 01/08/16 requesting a prescription. Patient last saw NP 01/08/16

## 2016-01-15 ENCOUNTER — Ambulatory Visit: Payer: Medicare Other | Admitting: Family

## 2016-03-15 ENCOUNTER — Ambulatory Visit: Payer: Medicare Other | Admitting: Family Medicine

## 2016-04-07 ENCOUNTER — Telehealth: Payer: Self-pay

## 2016-04-07 NOTE — Telephone Encounter (Signed)
Pre Visit call completed. 

## 2016-04-08 ENCOUNTER — Ambulatory Visit: Payer: Medicare Other | Admitting: Family Medicine

## 2016-05-13 ENCOUNTER — Ambulatory Visit: Payer: Medicare Other | Admitting: Family Medicine

## 2016-05-17 ENCOUNTER — Encounter: Payer: Self-pay | Admitting: Family Medicine

## 2016-05-17 ENCOUNTER — Ambulatory Visit (INDEPENDENT_AMBULATORY_CARE_PROVIDER_SITE_OTHER): Payer: Medicare Other | Admitting: Family Medicine

## 2016-05-17 VITALS — BP 118/80 | HR 96 | Temp 98.9°F | Ht 68.0 in | Wt 190.2 lb

## 2016-05-17 DIAGNOSIS — K219 Gastro-esophageal reflux disease without esophagitis: Secondary | ICD-10-CM

## 2016-05-17 DIAGNOSIS — M542 Cervicalgia: Secondary | ICD-10-CM

## 2016-05-17 DIAGNOSIS — K449 Diaphragmatic hernia without obstruction or gangrene: Secondary | ICD-10-CM | POA: Diagnosis not present

## 2016-05-17 DIAGNOSIS — E785 Hyperlipidemia, unspecified: Secondary | ICD-10-CM

## 2016-05-17 DIAGNOSIS — I700 Atherosclerosis of aorta: Secondary | ICD-10-CM

## 2016-05-17 DIAGNOSIS — I10 Essential (primary) hypertension: Secondary | ICD-10-CM

## 2016-05-17 DIAGNOSIS — M8580 Other specified disorders of bone density and structure, unspecified site: Secondary | ICD-10-CM | POA: Diagnosis not present

## 2016-05-17 DIAGNOSIS — G8929 Other chronic pain: Secondary | ICD-10-CM

## 2016-05-17 DIAGNOSIS — K8021 Calculus of gallbladder without cholecystitis with obstruction: Secondary | ICD-10-CM

## 2016-05-17 DIAGNOSIS — K8020 Calculus of gallbladder without cholecystitis without obstruction: Secondary | ICD-10-CM | POA: Insufficient documentation

## 2016-05-17 HISTORY — DX: Diaphragmatic hernia without obstruction or gangrene: K44.9

## 2016-05-17 HISTORY — DX: Calculus of gallbladder without cholecystitis without obstruction: K80.20

## 2016-05-17 MED ORDER — DULOXETINE HCL 20 MG PO CPEP: 20.0000 mg | ORAL_CAPSULE | Freq: Every day | ORAL | 2 refills | Status: DC

## 2016-05-17 MED ORDER — DULOXETINE HCL 30 MG PO CPEP: 30.0000 mg | ORAL_CAPSULE | Freq: Every day | ORAL | 1 refills | Status: DC

## 2016-05-17 NOTE — Progress Notes (Signed)
Pre visit review using our clinic review tool, if applicable. No additional management support is needed unless otherwise documented below in the visit note. 

## 2016-05-17 NOTE — Assessment & Plan Note (Signed)
Encouraged to get adequate exercise, calcium and vitamin d intake 

## 2016-05-17 NOTE — Patient Instructions (Signed)
Preventive Care for Adults, Female A healthy lifestyle and preventive care can promote health and wellness. Preventive health guidelines for women include the following key practices.  A routine yearly physical is a good way to check with your health care provider about your health and preventive screening. It is a chance to share any concerns and updates on your health and to receive a thorough exam.  Visit your dentist for a routine exam and preventive care every 6 months. Brush your teeth twice a day and floss once a day. Good oral hygiene prevents tooth decay and gum disease.  The frequency of eye exams is based on your age, health, family medical history, use of contact lenses, and other factors. Follow your health care provider's recommendations for frequency of eye exams.  Eat a healthy diet. Foods like vegetables, fruits, whole grains, low-fat dairy products, and lean protein foods contain the nutrients you need without too many calories. Decrease your intake of foods high in solid fats, added sugars, and salt. Eat the right amount of calories for you.Get information about a proper diet from your health care provider, if necessary.  Regular physical exercise is one of the most important things you can do for your health. Most adults should get at least 150 minutes of moderate-intensity exercise (any activity that increases your heart rate and causes you to sweat) each week. In addition, most adults need muscle-strengthening exercises on 2 or more days a week.  Maintain a healthy weight. The body mass index (BMI) is a screening tool to identify possible weight problems. It provides an estimate of body fat based on height and weight. Your health care provider can find your BMI and can help you achieve or maintain a healthy weight.For adults 20 years and older:  A BMI below 18.5 is considered underweight.  A BMI of 18.5 to 24.9 is normal.  A BMI of 25 to 29.9 is considered overweight.  A  BMI of 30 and above is considered obese.  Maintain normal blood lipids and cholesterol levels by exercising and minimizing your intake of saturated fat. Eat a balanced diet with plenty of fruit and vegetables. Blood tests for lipids and cholesterol should begin at age 45 and be repeated every 5 years. If your lipid or cholesterol levels are high, you are over 50, or you are at high risk for heart disease, you may need your cholesterol levels checked more frequently.Ongoing high lipid and cholesterol levels should be treated with medicines if diet and exercise are not working.  If you smoke, find out from your health care provider how to quit. If you do not use tobacco, do not start.  Lung cancer screening is recommended for adults aged 45-80 years who are at high risk for developing lung cancer because of a history of smoking. A yearly low-dose CT scan of the lungs is recommended for people who have at least a 30-pack-year history of smoking and are a current smoker or have quit within the past 15 years. A pack year of smoking is smoking an average of 1 pack of cigarettes a day for 1 year (for example: 1 pack a day for 30 years or 2 packs a day for 15 years). Yearly screening should continue until the smoker has stopped smoking for at least 15 years. Yearly screening should be stopped for people who develop a health problem that would prevent them from having lung cancer treatment.  If you are pregnant, do not drink alcohol. If you are  breastfeeding, be very cautious about drinking alcohol. If you are not pregnant and choose to drink alcohol, do not have more than 1 drink per day. One drink is considered to be 12 ounces (355 mL) of beer, 5 ounces (148 mL) of wine, or 1.5 ounces (44 mL) of liquor.  Avoid use of street drugs. Do not share needles with anyone. Ask for help if you need support or instructions about stopping the use of drugs.  High blood pressure causes heart disease and increases the risk  of stroke. Your blood pressure should be checked at least every 1 to 2 years. Ongoing high blood pressure should be treated with medicines if weight loss and exercise do not work.  If you are 55-79 years old, ask your health care provider if you should take aspirin to prevent strokes.  Diabetes screening is done by taking a blood sample to check your blood glucose level after you have not eaten for a certain period of time (fasting). If you are not overweight and you do not have risk factors for diabetes, you should be screened once every 3 years starting at age 45. If you are overweight or obese and you are 40-70 years of age, you should be screened for diabetes every year as part of your cardiovascular risk assessment.  Breast cancer screening is essential preventive care for women. You should practice "breast self-awareness." This means understanding the normal appearance and feel of your breasts and may include breast self-examination. Any changes detected, no matter how small, should be reported to a health care provider. Women in their 20s and 30s should have a clinical breast exam (CBE) by a health care provider as part of a regular health exam every 1 to 3 years. After age 40, women should have a CBE every year. Starting at age 40, women should consider having a mammogram (breast X-ray test) every year. Women who have a family history of breast cancer should talk to their health care provider about genetic screening. Women at a high risk of breast cancer should talk to their health care providers about having an MRI and a mammogram every year.  Breast cancer gene (BRCA)-related cancer risk assessment is recommended for women who have family members with BRCA-related cancers. BRCA-related cancers include breast, ovarian, tubal, and peritoneal cancers. Having family members with these cancers may be associated with an increased risk for harmful changes (mutations) in the breast cancer genes BRCA1 and  BRCA2. Results of the assessment will determine the need for genetic counseling and BRCA1 and BRCA2 testing.  Your health care provider may recommend that you be screened regularly for cancer of the pelvic organs (ovaries, uterus, and vagina). This screening involves a pelvic examination, including checking for microscopic changes to the surface of your cervix (Pap test). You may be encouraged to have this screening done every 3 years, beginning at age 21.  For women ages 30-65, health care providers may recommend pelvic exams and Pap testing every 3 years, or they may recommend the Pap and pelvic exam, combined with testing for human papilloma virus (HPV), every 5 years. Some types of HPV increase your risk of cervical cancer. Testing for HPV may also be done on women of any age with unclear Pap test results.  Other health care providers may not recommend any screening for nonpregnant women who are considered low risk for pelvic cancer and who do not have symptoms. Ask your health care provider if a screening pelvic exam is right for   you.  If you have had past treatment for cervical cancer or a condition that could lead to cancer, you need Pap tests and screening for cancer for at least 20 years after your treatment. If Pap tests have been discontinued, your risk factors (such as having a new sexual partner) need to be reassessed to determine if screening should resume. Some women have medical problems that increase the chance of getting cervical cancer. In these cases, your health care provider may recommend more frequent screening and Pap tests.  Colorectal cancer can be detected and often prevented. Most routine colorectal cancer screening begins at the age of 50 years and continues through age 75 years. However, your health care provider may recommend screening at an earlier age if you have risk factors for colon cancer. On a yearly basis, your health care provider may provide home test kits to check  for hidden blood in the stool. Use of a small camera at the end of a tube, to directly examine the colon (sigmoidoscopy or colonoscopy), can detect the earliest forms of colorectal cancer. Talk to your health care provider about this at age 50, when routine screening begins. Direct exam of the colon should be repeated every 5-10 years through age 75 years, unless early forms of precancerous polyps or small growths are found.  People who are at an increased risk for hepatitis B should be screened for this virus. You are considered at high risk for hepatitis B if:  You were born in a country where hepatitis B occurs often. Talk with your health care provider about which countries are considered high risk.  Your parents were born in a high-risk country and you have not received a shot to protect against hepatitis B (hepatitis B vaccine).  You have HIV or AIDS.  You use needles to inject street drugs.  You live with, or have sex with, someone who has hepatitis B.  You get hemodialysis treatment.  You take certain medicines for conditions like cancer, organ transplantation, and autoimmune conditions.  Hepatitis C blood testing is recommended for all people born from 1945 through 1965 and any individual with known risks for hepatitis C.  Practice safe sex. Use condoms and avoid high-risk sexual practices to reduce the spread of sexually transmitted infections (STIs). STIs include gonorrhea, chlamydia, syphilis, trichomonas, herpes, HPV, and human immunodeficiency virus (HIV). Herpes, HIV, and HPV are viral illnesses that have no cure. They can result in disability, cancer, and death.  You should be screened for sexually transmitted illnesses (STIs) including gonorrhea and chlamydia if:  You are sexually active and are younger than 24 years.  You are older than 24 years and your health care provider tells you that you are at risk for this type of infection.  Your sexual activity has changed  since you were last screened and you are at an increased risk for chlamydia or gonorrhea. Ask your health care provider if you are at risk.  If you are at risk of being infected with HIV, it is recommended that you take a prescription medicine daily to prevent HIV infection. This is called preexposure prophylaxis (PrEP). You are considered at risk if:  You are sexually active and do not regularly use condoms or know the HIV status of your partner(s).  You take drugs by injection.  You are sexually active with a partner who has HIV.  Talk with your health care provider about whether you are at high risk of being infected with HIV. If   you choose to begin PrEP, you should first be tested for HIV. You should then be tested every 3 months for as long as you are taking PrEP.  Osteoporosis is a disease in which the bones lose minerals and strength with aging. This can result in serious bone fractures or breaks. The risk of osteoporosis can be identified using a bone density scan. Women ages 67 years and over and women at risk for fractures or osteoporosis should discuss screening with their health care providers. Ask your health care provider whether you should take a calcium supplement or vitamin D to reduce the rate of osteoporosis.  Menopause can be associated with physical symptoms and risks. Hormone replacement therapy is available to decrease symptoms and risks. You should talk to your health care provider about whether hormone replacement therapy is right for you.  Use sunscreen. Apply sunscreen liberally and repeatedly throughout the day. You should seek shade when your shadow is shorter than you. Protect yourself by wearing long sleeves, pants, a wide-brimmed hat, and sunglasses year round, whenever you are outdoors.  Once a month, do a whole body skin exam, using a mirror to look at the skin on your back. Tell your health care provider of new moles, moles that have irregular borders, moles that  are larger than a pencil eraser, or moles that have changed in shape or color.  Stay current with required vaccines (immunizations).  Influenza vaccine. All adults should be immunized every year.  Tetanus, diphtheria, and acellular pertussis (Td, Tdap) vaccine. Pregnant women should receive 1 dose of Tdap vaccine during each pregnancy. The dose should be obtained regardless of the length of time since the last dose. Immunization is preferred during the 27th-36th week of gestation. An adult who has not previously received Tdap or who does not know her vaccine status should receive 1 dose of Tdap. This initial dose should be followed by tetanus and diphtheria toxoids (Td) booster doses every 10 years. Adults with an unknown or incomplete history of completing a 3-dose immunization series with Td-containing vaccines should begin or complete a primary immunization series including a Tdap dose. Adults should receive a Td booster every 10 years.  Varicella vaccine. An adult without evidence of immunity to varicella should receive 2 doses or a second dose if she has previously received 1 dose. Pregnant females who do not have evidence of immunity should receive the first dose after pregnancy. This first dose should be obtained before leaving the health care facility. The second dose should be obtained 4-8 weeks after the first dose.  Human papillomavirus (HPV) vaccine. Females aged 13-26 years who have not received the vaccine previously should obtain the 3-dose series. The vaccine is not recommended for use in pregnant females. However, pregnancy testing is not needed before receiving a dose. If a female is found to be pregnant after receiving a dose, no treatment is needed. In that case, the remaining doses should be delayed until after the pregnancy. Immunization is recommended for any person with an immunocompromised condition through the age of 61 years if she did not get any or all doses earlier. During the  3-dose series, the second dose should be obtained 4-8 weeks after the first dose. The third dose should be obtained 24 weeks after the first dose and 16 weeks after the second dose.  Zoster vaccine. One dose is recommended for adults aged 30 years or older unless certain conditions are present.  Measles, mumps, and rubella (MMR) vaccine. Adults born  before 1957 generally are considered immune to measles and mumps. Adults born in 1957 or later should have 1 or more doses of MMR vaccine unless there is a contraindication to the vaccine or there is laboratory evidence of immunity to each of the three diseases. A routine second dose of MMR vaccine should be obtained at least 28 days after the first dose for students attending postsecondary schools, health care workers, or international travelers. People who received inactivated measles vaccine or an unknown type of measles vaccine during 1963-1967 should receive 2 doses of MMR vaccine. People who received inactivated mumps vaccine or an unknown type of mumps vaccine before 1979 and are at high risk for mumps infection should consider immunization with 2 doses of MMR vaccine. For females of childbearing age, rubella immunity should be determined. If there is no evidence of immunity, females who are not pregnant should be vaccinated. If there is no evidence of immunity, females who are pregnant should delay immunization until after pregnancy. Unvaccinated health care workers born before 1957 who lack laboratory evidence of measles, mumps, or rubella immunity or laboratory confirmation of disease should consider measles and mumps immunization with 2 doses of MMR vaccine or rubella immunization with 1 dose of MMR vaccine.  Pneumococcal 13-valent conjugate (PCV13) vaccine. When indicated, a person who is uncertain of his immunization history and has no record of immunization should receive the PCV13 vaccine. All adults 65 years of age and older should receive this  vaccine. An adult aged 19 years or older who has certain medical conditions and has not been previously immunized should receive 1 dose of PCV13 vaccine. This PCV13 should be followed with a dose of pneumococcal polysaccharide (PPSV23) vaccine. Adults who are at high risk for pneumococcal disease should obtain the PPSV23 vaccine at least 8 weeks after the dose of PCV13 vaccine. Adults older than 70 years of age who have normal immune system function should obtain the PPSV23 vaccine dose at least 1 year after the dose of PCV13 vaccine.  Pneumococcal polysaccharide (PPSV23) vaccine. When PCV13 is also indicated, PCV13 should be obtained first. All adults aged 65 years and older should be immunized. An adult younger than age 65 years who has certain medical conditions should be immunized. Any person who resides in a nursing home or long-term care facility should be immunized. An adult smoker should be immunized. People with an immunocompromised condition and certain other conditions should receive both PCV13 and PPSV23 vaccines. People with human immunodeficiency virus (HIV) infection should be immunized as soon as possible after diagnosis. Immunization during chemotherapy or radiation therapy should be avoided. Routine use of PPSV23 vaccine is not recommended for American Indians, [place] Natives, or people younger than 65 years unless there are medical conditions that require PPSV23 vaccine. When indicated, people who have unknown immunization and have no record of immunization should receive PPSV23 vaccine. One-time revaccination 5 years after the first dose of PPSV23 is recommended for people aged 19-64 years who have chronic kidney failure, nephrotic syndrome, asplenia, or immunocompromised conditions. People who received 1-2 doses of PPSV23 before age 65 years should receive another dose of PPSV23 vaccine at age 65 years or later if at least 5 years have passed since the previous dose. Doses of PPSV23 are not  needed for people immunized with PPSV23 at or after age 65 years.  Meningococcal vaccine. Adults with asplenia or persistent complement component deficiencies should receive 2 doses of quadrivalent meningococcal conjugate (MenACWY-D) vaccine. The doses should be obtained   at least 2 months apart. Microbiologists working with certain meningococcal bacteria, [place] recruits, people at risk during an outbreak, and people who travel to or live in countries with a high rate of meningitis should be immunized. A first-year college student up through age 34 years who is living in a residence hall should receive a dose if she did not receive a dose on or after her 16th birthday. Adults who have certain high-risk conditions should receive one or more doses of vaccine.  Hepatitis A vaccine. Adults who wish to be protected from this disease, have certain high-risk conditions, work with hepatitis A-infected animals, work in hepatitis A research labs, or travel to or work in countries with a high rate of hepatitis A should be immunized. Adults who were previously unvaccinated and who anticipate close contact with an international adoptee during the first 60 days after arrival in the [place] from a country with a high rate of hepatitis A should be immunized.  Hepatitis B vaccine. Adults who wish to be protected from this disease, have certain high-risk conditions, may be exposed to blood or other infectious body fluids, are household contacts or sex partners of hepatitis B positive people, are clients or workers in certain care facilities, or travel to or work in countries with a high rate of hepatitis B should be immunized.  Haemophilus influenzae type b (Hib) vaccine. A previously unvaccinated person with asplenia or sickle cell disease or having a scheduled splenectomy should receive 1 dose of Hib vaccine. Regardless of previous immunization, a recipient of a hematopoietic stem cell transplant should receive a  3-dose series 6-12 months after her successful transplant. Hib vaccine is not recommended for adults with HIV infection. Preventive Services / Frequency Ages 35 to 4 years  Blood pressure check.** / Every 3-5 years.  Lipid and cholesterol check.** / Every 5 years beginning at age 60.  Clinical breast exam.** / Every 3 years for women in their 71s and 10s.  BRCA-related cancer risk assessment.** / For women who have family members with a BRCA-related cancer (breast, ovarian, tubal, or peritoneal cancers).  Pap test.** / Every 2 years from ages 76 through 26. Every 3 years starting at age 61 through age 76 or 93 with a history of 3 consecutive normal Pap tests.  HPV screening.** / Every 3 years from ages 37 through ages 60 to 51 with a history of 3 consecutive normal Pap tests.  Hepatitis C blood test.** / For any individual with known risks for hepatitis C.  Skin self-exam. / Monthly.  Influenza vaccine. / Every year.  Tetanus, diphtheria, and acellular pertussis (Tdap, Td) vaccine.** / Consult your health care provider. Pregnant women should receive 1 dose of Tdap vaccine during each pregnancy. 1 dose of Td every 10 years.  Varicella vaccine.** / Consult your health care provider. Pregnant females who do not have evidence of immunity should receive the first dose after pregnancy.  HPV vaccine. / 3 doses over 6 months, if 93 and younger. The vaccine is not recommended for use in pregnant females. However, pregnancy testing is not needed before receiving a dose.  Measles, mumps, rubella (MMR) vaccine.** / You need at least 1 dose of MMR if you were born in 1957 or later. You may also need a 2nd dose. For females of childbearing age, rubella immunity should be determined. If there is no evidence of immunity, females who are not pregnant should be vaccinated. If there is no evidence of immunity, females who are  pregnant should delay immunization until after pregnancy.  Pneumococcal  13-valent conjugate (PCV13) vaccine.** / Consult your health care provider.  Pneumococcal polysaccharide (PPSV23) vaccine.** / 1 to 2 doses if you smoke cigarettes or if you have certain conditions.  Meningococcal vaccine.** / 1 dose if you are age 68 to 8 years and a Market researcher living in a residence hall, or have one of several medical conditions, you need to get vaccinated against meningococcal disease. You may also need additional booster doses.  Hepatitis A vaccine.** / Consult your health care provider.  Hepatitis B vaccine.** / Consult your health care provider.  Haemophilus influenzae type b (Hib) vaccine.** / Consult your health care provider. Ages 7 to 53 years  Blood pressure check.** / Every year.  Lipid and cholesterol check.** / Every 5 years beginning at age 25 years.  Lung cancer screening. / Every year if you are aged 11-80 years and have a 30-pack-year history of smoking and currently smoke or have quit within the past 15 years. Yearly screening is stopped once you have quit smoking for at least 15 years or develop a health problem that would prevent you from having lung cancer treatment.  Clinical breast exam.** / Every year after age 48 years.  BRCA-related cancer risk assessment.** / For women who have family members with a BRCA-related cancer (breast, ovarian, tubal, or peritoneal cancers).  Mammogram.** / Every year beginning at age 41 years and continuing for as long as you are in good health. Consult with your health care provider.  Pap test.** / Every 3 years starting at age 65 years through age 37 or 70 years with a history of 3 consecutive normal Pap tests.  HPV screening.** / Every 3 years from ages 72 years through ages 60 to 40 years with a history of 3 consecutive normal Pap tests.  Fecal occult blood test (FOBT) of stool. / Every year beginning at age 21 years and continuing until age 5 years. You may not need to do this test if you get  a colonoscopy every 10 years.  Flexible sigmoidoscopy or colonoscopy.** / Every 5 years for a flexible sigmoidoscopy or every 10 years for a colonoscopy beginning at age 35 years and continuing until age 48 years.  Hepatitis C blood test.** / For all people born from [birth year] through 1965 and any individual with known risks for hepatitis C.  Skin self-exam. / Monthly.  Influenza vaccine. / Every year.  Tetanus, diphtheria, and acellular pertussis (Tdap/Td) vaccine.** / Consult your health care provider. Pregnant women should receive 1 dose of Tdap vaccine during each pregnancy. 1 dose of Td every 10 years.  Varicella vaccine.** / Consult your health care provider. Pregnant females who do not have evidence of immunity should receive the first dose after pregnancy.  Zoster vaccine.** / 1 dose for adults aged 30 years or older.  Measles, mumps, rubella (MMR) vaccine.** / You need at least 1 dose of MMR if you were born in 1957 or later. You may also need a second dose. For females of childbearing age, rubella immunity should be determined. If there is no evidence of immunity, females who are not pregnant should be vaccinated. If there is no evidence of immunity, females who are pregnant should delay immunization until after pregnancy.  Pneumococcal 13-valent conjugate (PCV13) vaccine.** / Consult your health care provider.  Pneumococcal polysaccharide (PPSV23) vaccine.** / 1 to 2 doses if you smoke cigarettes or if you have certain conditions.  Meningococcal vaccine.** /  Consult your health care provider.  Hepatitis A vaccine.** / Consult your health care provider.  Hepatitis B vaccine.** / Consult your health care provider.  Haemophilus influenzae type b (Hib) vaccine.** / Consult your health care provider. Ages 64 years and over  Blood pressure check.** / Every year.  Lipid and cholesterol check.** / Every 5 years beginning at age 23 years.  Lung cancer screening. / Every year if you  are aged 16-80 years and have a 30-pack-year history of smoking and currently smoke or have quit within the past 15 years. Yearly screening is stopped once you have quit smoking for at least 15 years or develop a health problem that would prevent you from having lung cancer treatment.  Clinical breast exam.** / Every year after age 74 years.  BRCA-related cancer risk assessment.** / For women who have family members with a BRCA-related cancer (breast, ovarian, tubal, or peritoneal cancers).  Mammogram.** / Every year beginning at age 44 years and continuing for as long as you are in good health. Consult with your health care provider.  Pap test.** / Every 3 years starting at age 58 years through age 22 or 39 years with 3 consecutive normal Pap tests. Testing can be stopped between 65 and 70 years with 3 consecutive normal Pap tests and no abnormal Pap or HPV tests in the past 10 years.  HPV screening.** / Every 3 years from ages 64 years through ages 70 or 61 years with a history of 3 consecutive normal Pap tests. Testing can be stopped between 65 and 70 years with 3 consecutive normal Pap tests and no abnormal Pap or HPV tests in the past 10 years.  Fecal occult blood test (FOBT) of stool. / Every year beginning at age 40 years and continuing until age 27 years. You may not need to do this test if you get a colonoscopy every 10 years.  Flexible sigmoidoscopy or colonoscopy.** / Every 5 years for a flexible sigmoidoscopy or every 10 years for a colonoscopy beginning at age 7 years and continuing until age 32 years.  Hepatitis C blood test.** / For all people born from [birth year] through 1965 and any individual with known risks for hepatitis C.  Osteoporosis screening.** / A one-time screening for women ages 30 years and over and women at risk for fractures or osteoporosis.  Skin self-exam. / Monthly.  Influenza vaccine. / Every year.  Tetanus, diphtheria, and acellular pertussis (Tdap/Td)  vaccine.** / 1 dose of Td every 10 years.  Varicella vaccine.** / Consult your health care provider.  Zoster vaccine.** / 1 dose for adults aged 35 years or older.  Pneumococcal 13-valent conjugate (PCV13) vaccine.** / Consult your health care provider.  Pneumococcal polysaccharide (PPSV23) vaccine.** / 1 dose for all adults aged 46 years and older.  Meningococcal vaccine.** / Consult your health care provider.  Hepatitis A vaccine.** / Consult your health care provider.  Hepatitis B vaccine.** / Consult your health care provider.  Haemophilus influenzae type b (Hib) vaccine.** / Consult your health care provider. ** Family history and personal history of risk and conditions may change your health care provider's recommendations.   This information is not intended to replace advice given to you by your health care provider. Make sure you discuss any questions you have with your health care provider.   Document Released: 11/01/2001 Document Revised: 09/26/2014 Document Reviewed: 01/31/2011 Elsevier Interactive Patient Education [employer].

## 2016-05-17 NOTE — Assessment & Plan Note (Signed)
Encouraged heart healthy diet, increase exercise, avoid trans fats, consider a krill oil cap daily 

## 2016-05-20 ENCOUNTER — Ambulatory Visit: Payer: Medicare Other | Admitting: Family Medicine

## 2016-05-20 ENCOUNTER — Telehealth: Payer: Self-pay | Admitting: Family Medicine

## 2016-05-20 MED ORDER — PRAVASTATIN SODIUM 40 MG PO TABS: 40.0000 mg | ORAL_TABLET | Freq: Every day | ORAL | 3 refills | Status: DC

## 2016-05-20 MED ORDER — FOSINOPRIL SODIUM 10 MG PO TABS: 10.0000 mg | ORAL_TABLET | Freq: Every day | ORAL | 3 refills | Status: DC

## 2016-05-20 MED ORDER — DILTIAZEM HCL ER 180 MG PO CP24: 180.0000 mg | ORAL_CAPSULE | Freq: Every day | ORAL | 3 refills | Status: DC

## 2016-05-20 NOTE — Telephone Encounter (Signed)
°  Relationship to patient: Self  Can be reached: [phone]   Pharmacy:  [place], [place] [phone] (Phone) [phone] (Fax)     Reason for call: Patient request refill on diltiazem (DILACOR XR) 180 MG 24 hr capsule [id number]   fosinopril (MONOPRIL) 10 MG tablet [id number]   pravastatin (PRAVACHOL) 40 MG tablet [id number]

## 2016-05-20 NOTE — Telephone Encounter (Signed)
Refills done and patient aware

## 2016-05-29 ENCOUNTER — Encounter: Payer: Self-pay | Admitting: Family Medicine

## 2016-05-29 DIAGNOSIS — G8929 Other chronic pain: Secondary | ICD-10-CM

## 2016-05-29 DIAGNOSIS — M542 Cervicalgia: Secondary | ICD-10-CM

## 2016-05-29 DIAGNOSIS — I700 Atherosclerosis of aorta: Secondary | ICD-10-CM | POA: Insufficient documentation

## 2016-05-29 HISTORY — DX: Other chronic pain: G89.29

## 2016-05-29 NOTE — Assessment & Plan Note (Signed)
Avoid offending foods, start probiotics. Do not eat large meals in late evening and consider raising head of bed.  

## 2016-05-29 NOTE — Assessment & Plan Note (Signed)
Noted incidentally on imaging in 2016. asymptomatic

## 2016-05-29 NOTE — Assessment & Plan Note (Signed)
Well controlled, no changes to meds. Encouraged heart healthy diet such as the DASH diet and exercise as tolerated.  °

## 2016-05-29 NOTE — Assessment & Plan Note (Signed)
Follows with [facility]. 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 Encouraged moist heat and gentle stretching as tolerated. May try NSAIDs and prescription meds as directed and report if symptoms worsen or seek immediate care, consider Lidocaine patches

## 2016-05-29 NOTE — Assessment & Plan Note (Signed)
1.7 cm gallstone noted on CT scan on 09/09/2015, asymptomatic

## 2016-05-29 NOTE — Progress Notes (Signed)
Patient ID: Madeline [name], female   DOB: May 24, 1946, 70 y.o.   MRN: [id number]   Subjective:    Patient ID: Madeline [name], female    DOB: 12-29-1945, 70 y.o.   MRN: [id number]  Chief Complaint  Patient presents with  . Establish Care    HPI Patient is in today for new patient appointment. No recent illness or acute concerns. She notes some fatigue and myalgias. No recent hospitalizations. She has a complicated past medical history that includes hiatal hernia with reflux, hematuria, hypertension, atherosclerosis, hyperlipidemai, osteoarthritis, osteopenia and chronic neck pain. Denies CP/palp/SOB/HA/congestion/fevers/GI or GU c/o. Taking meds as prescribed Past Medical History:  Diagnosis Date  . Anxiety   . Arthritis 09-14-11   osteoarthritis, osteopenia  . Cataracts, both eyes 09-13-11   not surgical yet  . Cholelithiasis 05/17/2016  . Chronic neck pain 05/29/2016  . Cystitis 2007   sepsis post bladder biopsy, Dr Jeffie Pollock  . Edema 09-14-11   occ. ankles/ feet > left leg  . Fibromyalgia 09-14-11   Dr Donney Dice, [facility]  . Fractures 09-14-11   toes-left foot  . GERD (gastroesophageal reflux disease) 09-14-11   tx. Nexium  . H/O foot surgery 12/19/2011  . Headache(784.0) 09-13-11   less frequent to none at present  . Heart murmur 09-13-11  . Hiatal hernia 05/17/2016  . Hiatal hernia with gastroesophageal reflux 12/21/2007   Qualifier: Diagnosis of  By: Linna Darner MD, Gwyndolyn Saxon    10/09/14 dysphagia >1 X /month   . History of knee replacement 09/21/2011   left  . Hyperlipidemia   . Hypertension 09-13-11   tx. meds  . 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 Strep throat 09-14-11   none in a yr.  . TMJ click [id number]   right side > left  . 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 ABDOMINAL HYSTERECTOMY  1977   dysfunctional menses; age 30  . APPENDECTOMY  1963    . COLONOSCOPY  2011   negative  . CYSTOSTOMY W/ BLADDER BIOPSY  04-2006  . FOOT ARTHRODESIS  01/03/12   [facility]-- left hallux MP joint arthrodesis  . HAMMER TOE SURGERY  01/03/12   [facility], Dr Doran Durand  . NASAL SINUS SURGERY  09-14-11   right side " sinus lift"  . SIGMOIDOSCOPY    . sling procedure  2006   a and p repair  . TONSILLECTOMY    . TOTAL KNEE ARTHROPLASTY  09/21/2011   Procedure: TOTAL KNEE ARTHROPLASTY;  Surgeon: Gearlean Alf;  Location: [facility];  Service: Orthopedics;  Laterality: Left;  . TOTAL KNEE ARTHROPLASTY  06/25/2012   Procedure: TOTAL KNEE ARTHROPLASTY;  Surgeon: Gearlean Alf, MD;  Location: [facility];  Service: Orthopedics;  Laterality: Right;    Family History  Problem Relation Age of Onset  . Aortic aneurysm Father     femoral aneurysm  . Mental illness Brother     paranoid schizophrenia  . Heart attack      both GF;PGM; M uncle  . Stroke Paternal Aunt     aneurysm  . Colon cancer Paternal Aunt     X 2  . Other Mother     lung tumor which resolved w/o treatment  . Hypertension Mother   . Other Maternal Grandmother     enalraged heart  . Heart attack Maternal Grandfather  In fifties, died.  Marland Kitchen Heart attack Paternal Grandmother     In forties.  Marland Kitchen Heart attack Paternal Grandfather   . Diabetes Neg Hx     Social History   Social History  . Marital status: Married    Spouse name: N/A  . Number of children: N/A  . Years of education: N/A   Occupational History  . Not on file.   Social History Main Topics  . Smoking status: Never Smoker  . Smokeless tobacco: Never Used  . Alcohol use Yes     Comment: rarely  . Drug use: No  . Sexual activity: No   Other Topics Concern  . Not on file   Social History Narrative   No reg exercise    Outpatient Medications Prior to Visit  Medication Sig Dispense Refill  . aspirin EC 81 MG tablet Take 81 mg by mouth daily.    Marland Kitchen CALCIUM PO Take 600 mg by mouth 2 (two) times daily.     .  Cholecalciferol (VITAMIN D3) 2000 UNITS TABS Take 2,000 Units by mouth.     . cyclobenzaprine (FLEXERIL) 10 MG tablet Take 20 mg by mouth at bedtime.    . meloxicam (MOBIC) 7.5 MG tablet Take 7.5 mg by mouth daily. Every morning    . Multiple Vitamin (MULTI-DAY PO) Take by mouth daily.    Marland Kitchen oxyCODONE (OXY IR/ROXICODONE) 5 MG immediate release tablet Take 1 tablet by mouth every 6 (six) hours as needed.    . pregabalin (LYRICA) 75 MG capsule Take 75 mg by mouth at bedtime. Pt wants no generic    . ranitidine (ZANTAC) 150 MG capsule Take 150 mg by mouth 2 (two) times daily.     . tizanidine (ZANAFLEX) 2 MG capsule Take 4 mg by mouth at bedtime.    . traMADol (ULTRAM) 50 MG tablet Take 2 tablets by mouth every 6 (six) hours as needed. Not to exceed 6 tablets a day    . diltiazem (DILACOR XR) 180 MG 24 hr capsule Take 1 capsule (180 mg total) by mouth at bedtime. 90 capsule 3  . DULoxetine (CYMBALTA) 60 MG capsule Take 60 mg by mouth daily.    . fosinopril (MONOPRIL) 10 MG tablet Take 1 tablet (10 mg total) by mouth at bedtime. 90 tablet 3  . pravastatin (PRAVACHOL) 40 MG tablet Take 1 tablet (40 mg total) by mouth at bedtime. 90 tablet 3   No facility-administered medications prior to visit.     Allergies  Allergen Reactions  . Ivp Dye [Iodinated Diagnostic Agents]     Rash , hives  . Codeine Itching    Unless something given to counteract itching    Review of Systems  Constitutional: Negative for chills, fever and malaise/fatigue.  HENT: Negative for congestion and hearing loss.   Eyes: Negative for discharge.  Respiratory: Negative for cough, sputum production and shortness of breath.   Cardiovascular: Negative for chest pain, palpitations and leg swelling.  Gastrointestinal: Negative for abdominal pain, blood in stool, constipation, diarrhea, heartburn, nausea and vomiting.  Genitourinary: Negative for dysuria, frequency, hematuria and urgency.  Musculoskeletal: Positive for joint  pain and neck pain. Negative for back pain, falls and myalgias.  Skin: Negative for rash.  Neurological: Negative for dizziness, sensory change, loss of consciousness, weakness and headaches.  Endo/Heme/Allergies: Negative for environmental allergies. Does not bruise/bleed easily.  Psychiatric/Behavioral: Negative for depression and suicidal ideas. The patient is not nervous/anxious and does not have insomnia.  Objective:    Physical Exam  BP 118/80 (BP Location: Left Arm, Patient Position: Sitting, Cuff Size: Large)   Pulse 96   Temp 98.9 F (37.2 C) (Oral)   Ht 5\' 8"  (1.727 m)   Wt 190 lb 4 oz (86.3 kg)   SpO2 97%   BMI 28.93 kg/m  Wt Readings from Last 3 Encounters:  05/17/16 190 lb 4 oz (86.3 kg)  01/08/16 192 lb 3.2 oz (87.2 kg)  06/09/15 190 lb 9.6 oz (86.5 kg)     Lab Results  Component Value Date   WBC 10.4 10/07/2013   HGB 13.5 10/07/2013   HCT 40.4 10/07/2013   PLT 244.0 10/07/2013   GLUCOSE 95 10/07/2013   CHOL 195 10/07/2013   TRIG 130.0 10/07/2013   HDL 75.60 10/07/2013   LDLCALC 93 10/07/2013   ALT 13 10/07/2013   AST 21 10/07/2013   NA 137 10/07/2013   K 3.8 10/07/2013   CL 102 10/07/2013   CREATININE 0.8 10/07/2013   BUN 16 10/07/2013   CO2 28 10/07/2013   TSH 1.45 10/07/2013   INR 1.02 06/15/2012    Lab Results  Component Value Date   TSH 1.45 10/07/2013   Lab Results  Component Value Date   WBC 10.4 10/07/2013   HGB 13.5 10/07/2013   HCT 40.4 10/07/2013   MCV 87.7 10/07/2013   PLT 244.0 10/07/2013   Lab Results  Component Value Date   NA 137 10/07/2013   K 3.8 10/07/2013   CO2 28 10/07/2013   GLUCOSE 95 10/07/2013   BUN 16 10/07/2013   CREATININE 0.8 10/07/2013   BILITOT 0.7 10/07/2013   ALKPHOS 77 10/07/2013   AST 21 10/07/2013   ALT 13 10/07/2013   PROT 7.6 10/07/2013   ALBUMIN 4.3 10/07/2013   CALCIUM 9.4 10/07/2013   GFR 74.78 10/07/2013   Lab Results  Component Value Date   CHOL 195 10/07/2013   Lab  Results  Component Value Date   HDL 75.60 10/07/2013   Lab Results  Component Value Date   LDLCALC 93 10/07/2013   Lab Results  Component Value Date   TRIG 130.0 10/07/2013   Lab Results  Component Value Date   CHOLHDL 3 10/07/2013   No results found for: HGBA1C     Assessment & Plan:   Problem List Items Addressed This Visit    Hyperlipidemia    Encouraged heart healthy diet, increase exercise, avoid trans fats, consider a krill oil cap daily      Relevant Orders   CBC with Differential/Platelet   Comprehensive metabolic panel   TSH   Lipid panel   Essential hypertension    Well controlled, no changes to meds. Encouraged heart healthy diet such as the DASH diet and exercise as tolerated.       Relevant Orders   CBC with Differential/Platelet   Comprehensive metabolic panel   TSH   Lipid panel   Hiatal hernia with gastroesophageal reflux - Primary    Avoid offending foods, start probiotics. Do not eat large meals in late evening and consider raising head of bed.       Osteopenia    Encouraged to get adequate exercise, calcium and vitamin d intake      Relevant Orders   CBC with Differential/Platelet   Comprehensive metabolic panel   TSH   Lipid panel   Gallstone    1.7 cm gallstone noted on CT scan on 09/09/2015, asymptomatic      Aortic atherosclerosis (  [place])    Noted incidentally on imaging in 2016. asymptomatic      Chronic neck pain    Follows with [facility]. 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 Encouraged moist heat and gentle stretching as tolerated. May try NSAIDs and prescription meds as directed and report if symptoms worsen or seek immediate care, consider Lidocaine patches      Relevant Medications   DULoxetine (CYMBALTA) 30 MG capsule   DULoxetine (CYMBALTA) 20 MG capsule   RESOLVED: Hiatal hernia    Other Visit Diagnoses   None.     I  have discontinued Ms. Greenblatt's DULoxetine. I am also having her start on DULoxetine and DULoxetine. Additionally, I am having her maintain her pregabalin, Vitamin D3, Multiple Vitamin (MULTI-DAY PO), CALCIUM PO, meloxicam, tizanidine, cyclobenzaprine, aspirin EC, ranitidine, oxyCODONE, and traMADol.  Meds ordered this encounter  Medications  . DULoxetine (CYMBALTA) 30 MG capsule    Sig: Take 1 capsule (30 mg total) by mouth daily.    Dispense:  30 capsule    Refill:  1  . DULoxetine (CYMBALTA) 20 MG capsule    Sig: Take 1 capsule (20 mg total) by mouth daily.    Dispense:  30 capsule    Refill:  2     Penni Homans, MD

## 2016-07-18 ENCOUNTER — Emergency Department (HOSPITAL_BASED_OUTPATIENT_CLINIC_OR_DEPARTMENT_OTHER)
Admission: EM | Admit: 2016-07-18 | Discharge: 2016-07-19 | Disposition: A | Discharge status: Home / Self Care | Payer: Medicare Other | Attending: Emergency Medicine | Admitting: Emergency Medicine

## 2016-07-18 ENCOUNTER — Encounter (HOSPITAL_BASED_OUTPATIENT_CLINIC_OR_DEPARTMENT_OTHER): Payer: Self-pay | Admitting: Emergency Medicine

## 2016-07-18 DIAGNOSIS — Z79899 Other long term (current) drug therapy: Secondary | ICD-10-CM | POA: Insufficient documentation

## 2016-07-18 DIAGNOSIS — R112 Nausea with vomiting, unspecified: Secondary | ICD-10-CM | POA: Diagnosis not present

## 2016-07-18 DIAGNOSIS — R1084 Generalized abdominal pain: Secondary | ICD-10-CM | POA: Diagnosis present

## 2016-07-18 DIAGNOSIS — I10 Essential (primary) hypertension: Secondary | ICD-10-CM | POA: Insufficient documentation

## 2016-07-18 DIAGNOSIS — R197 Diarrhea, unspecified: Secondary | ICD-10-CM | POA: Insufficient documentation

## 2016-07-18 DIAGNOSIS — Z7982 Long term (current) use of aspirin: Secondary | ICD-10-CM | POA: Diagnosis not present

## 2016-07-18 DIAGNOSIS — R000 Tachycardia, unspecified: Secondary | ICD-10-CM | POA: Diagnosis not present

## 2016-07-18 LAB — URINE MICROSCOPIC-ADD ON

## 2016-07-18 LAB — CBC WITH DIFFERENTIAL/PLATELET
BASOS PCT: 0 %
Basophils Absolute: 0 10*3/uL (ref 0.0–0.1)
EOS ABS: 0 10*3/uL (ref 0.0–0.7)
Eosinophils Relative: 0 %
HCT: 40.3 % (ref 36.0–46.0)
Hemoglobin: 13.6 g/dL (ref 12.0–15.0)
LYMPHS ABS: 1.1 10*3/uL (ref 0.7–4.0)
Lymphocytes Relative: 11 %
MCH: 30.1 pg (ref 26.0–34.0)
MCHC: 33.7 g/dL (ref 30.0–36.0)
MCV: 89.2 fL (ref 78.0–100.0)
MONO ABS: 0.7 10*3/uL (ref 0.1–1.0)
MONOS PCT: 7 %
Neutro Abs: 8.3 10*3/uL — ABNORMAL HIGH (ref 1.7–7.7)
Neutrophils Relative %: 82 %
Platelets: 253 10*3/uL (ref 150–400)
RBC: 4.52 MIL/uL (ref 3.87–5.11)
RDW: 13.9 % (ref 11.5–15.5)
WBC: 10.2 10*3/uL (ref 4.0–10.5)

## 2016-07-18 LAB — COMPREHENSIVE METABOLIC PANEL
ALBUMIN: 4.3 g/dL (ref 3.5–5.0)
ALT: 18 U/L (ref 14–54)
ANION GAP: 9 (ref 5–15)
AST: 28 U/L (ref 15–41)
Alkaline Phosphatase: 63 U/L (ref 38–126)
BILIRUBIN TOTAL: 0.7 mg/dL (ref 0.3–1.2)
BUN: 17 mg/dL (ref 6–20)
CO2: 24 mmol/L (ref 22–32)
Calcium: 9.4 mg/dL (ref 8.9–10.3)
Chloride: 105 mmol/L (ref 101–111)
Creatinine, Ser: 0.7 mg/dL (ref 0.44–1.00)
GFR calc non Af Amer: >60 mL/min (ref >60)
GLUCOSE: 118 mg/dL — AB (ref 65–99)
POTASSIUM: 3.4 mmol/L — AB (ref 3.5–5.1)
SODIUM: 138 mmol/L (ref 135–145)
TOTAL PROTEIN: 7.2 g/dL (ref 6.5–8.1)

## 2016-07-18 LAB — URINALYSIS, ROUTINE W REFLEX MICROSCOPIC
GLUCOSE, UA: NEGATIVE mg/dL
Nitrite: NEGATIVE
PH: 5.5 (ref 5.0–8.0)
Protein, ur: 30 mg/dL — AB
Specific Gravity, Urine: 1.025 (ref 1.005–1.030)

## 2016-07-18 LAB — LIPASE, BLOOD: Lipase: 16 U/L (ref 11–51)

## 2016-07-18 MED ORDER — ONDANSETRON HCL 4 MG/2ML IJ SOLN
4.0000 mg | Freq: Once | INTRAMUSCULAR | Status: AC
  Administered 2016-07-18: 4 mg via INTRAVENOUS
  Filled 2016-07-18: qty 2

## 2016-07-18 MED ORDER — SODIUM CHLORIDE 0.9 % IV BOLUS (SEPSIS)
1000.0000 mL | Freq: Once | INTRAVENOUS | Status: AC
  Administered 2016-07-18: 1000 mL via INTRAVENOUS

## 2016-07-18 NOTE — ED Triage Notes (Signed)
Patient states that yesterday she started to have frequent loose stools. The patient reports that she is having N/V/D and can not keep anything down.

## 2016-07-18 NOTE — ED Provider Notes (Signed)
[facility] Provider Note   CSN: [id number] Arrival date & time: 07/18/16  2101 By signing my name below, I, Madeline [name], attest that this documentation has been prepared under the direction and in the presence of [name], MD. Electronically Signed: Georgette [name], ED Scribe. 07/18/16. 9:41 PM.  History   Chief Complaint Chief Complaint  Patient presents with  . Abdominal Pain   HPI Comments: Madeline [name] is a 70 y.o. female with h/o GERD and HTN who presents to the Emergency Department complaining of generalized abdominal pain onset one day ago with associated diarrhea, nausea, vomiting, chills, and diaphoresis. Pain is exacerbated with movement. Pt has tried drinking Ginger Ale with no relief to her symptoms. Pt has h/o gallstones but notes that her symptoms at this time are not similar. No recent abdominal surgeries. Denies any recent long travel or unusual food exposure. Denies any recent medication changes but notes she has not been able to take her prescribed medications for the past day. Pt further denies hematochezia, dysuria, cough, or any other associated symptoms.   The history is provided by the patient. No language interpreter was used.    Past Medical History:  Diagnosis Date  . Anxiety   . Arthritis 09-14-11   osteoarthritis, osteopenia  . Cataracts, both eyes 09-13-11   not surgical yet  . Cholelithiasis 05/17/2016  . Chronic neck pain 05/29/2016  . Cystitis 2007   sepsis post bladder biopsy, Dr Jeffie Pollock  . Edema 09-14-11   occ. ankles/ feet > left leg  . Fibromyalgia 09-14-11   Dr Donney Dice, [facility]  . Fractures 09-14-11   toes-left foot  . GERD (gastroesophageal reflux disease) 09-14-11   tx. Nexium  . H/O foot surgery 12/19/2011  . Headache(784.0) 09-13-11   less frequent to none at present  . Heart murmur 09-13-11  . Hiatal hernia 05/17/2016  . Hiatal hernia with gastroesophageal reflux 12/21/2007   Qualifier: Diagnosis of  By: Linna Darner  MD, Gwyndolyn Saxon    10/09/14 dysphagia >1 X /month   . History of knee replacement 09/21/2011   left  . Hyperlipidemia   . Hypertension 09-13-11   tx. meds  . 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 Strep throat 09-14-11   none in a yr.  . TMJ click [id number]   right side > left  . 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 Aortic atherosclerosis ([place]) 05/29/2016  . Chronic neck pain 05/29/2016  . Gallstone 05/17/2016  . Microscopic hematuria 01/08/2016  . OA (osteoarthritis) of knee 06/25/2012  . 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 Osteopenia 03/05/2008  . Hyperlipidemia 12/21/2007  . Essential hypertension 12/21/2007  . Hiatal hernia with gastroesophageal reflux 12/21/2007  . CONSTIPATION, CHRONIC 12/21/2007  . FIBROMYALGIA 12/21/2007  . Memory loss 12/21/2007    Past Surgical History:  Procedure Laterality Date  . ABDOMINAL HYSTERECTOMY  1977   dysfunctional menses; age 49  . APPENDECTOMY  1963  . COLONOSCOPY  2011   negative  . CYSTOSTOMY W/ BLADDER BIOPSY  04-2006  . FOOT ARTHRODESIS  01/03/12    ortho-- left hallux MP joint arthrodesis  . HAMMER TOE SURGERY  01/03/12   [facility], Dr Doran Durand  . NASAL SINUS SURGERY  09-14-11   right side " sinus lift"  . SIGMOIDOSCOPY    . sling procedure  2006   a and p repair  . TONSILLECTOMY    . TOTAL KNEE ARTHROPLASTY  09/21/2011   Procedure: TOTAL KNEE ARTHROPLASTY;  Surgeon: Gearlean Alf;  Location: [facility];  Service: Orthopedics;  Laterality: Left;  . TOTAL KNEE ARTHROPLASTY  06/25/2012   Procedure: TOTAL KNEE ARTHROPLASTY;  Surgeon: Gearlean Alf, MD;  Location: [facility];  Service: Orthopedics;  Laterality: Right;    OB History    No data available       Home Medications    Prior to Admission medications   Medication Sig Start Date End Date Taking? Authorizing Provider    aspirin EC 81 MG tablet Take 81 mg by mouth daily.    Historical Provider, MD  CALCIUM PO Take 600 mg by mouth 2 (two) times daily.     Historical Provider, MD  Cholecalciferol (VITAMIN D3) 2000 UNITS TABS Take 2,000 Units by mouth.     Historical Provider, MD  cyclobenzaprine (FLEXERIL) 10 MG tablet Take 20 mg by mouth at bedtime.    Historical Provider, MD  diltiazem (DILACOR XR) 180 MG 24 hr capsule Take 1 capsule (180 mg total) by mouth at bedtime. 05/20/16   Mosie Lukes, MD  DULoxetine (CYMBALTA) 20 MG capsule Take 1 capsule (20 mg total) by mouth daily. 05/17/16   Mosie Lukes, MD  DULoxetine (CYMBALTA) 30 MG capsule Take 1 capsule (30 mg total) by mouth daily. 05/17/16   Mosie Lukes, MD  fosinopril (MONOPRIL) 10 MG tablet Take 1 tablet (10 mg total) by mouth at bedtime. 05/20/16   Mosie Lukes, MD  meloxicam (MOBIC) 7.5 MG tablet Take 7.5 mg by mouth daily. Every morning    Historical Provider, MD  Multiple Vitamin (MULTI-DAY PO) Take by mouth daily.    Historical Provider, MD  oxyCODONE (OXY IR/ROXICODONE) 5 MG immediate release tablet Take 1 tablet by mouth every 6 (six) hours as needed. 06/04/15   Historical Provider, MD  pravastatin (PRAVACHOL) 40 MG tablet Take 1 tablet (40 mg total) by mouth at bedtime. 05/20/16   Mosie Lukes, MD  pregabalin (LYRICA) 75 MG capsule Take 75 mg by mouth at bedtime. Pt wants no generic    Historical Provider, MD  ranitidine (ZANTAC) 150 MG capsule Take 150 mg by mouth 2 (two) times daily.     Historical Provider, MD  tizanidine (ZANAFLEX) 2 MG capsule Take 4 mg by mouth at bedtime.    Historical Provider, MD  traMADol (ULTRAM) 50 MG tablet Take 2 tablets by mouth every 6 (six) hours as needed. Not to exceed 6 tablets a day 06/04/15   Historical Provider, MD    Family History Family History  Problem Relation Age of Onset  . Aortic aneurysm Father     femoral aneurysm  . Mental illness Brother     paranoid schizophrenia  . Heart attack      both  GF;PGM; M uncle  . Stroke Paternal Aunt     aneurysm  . Colon cancer Paternal Aunt     X 2  . Other Mother     lung tumor which resolved w/o treatment  . Hypertension Mother   . Other Maternal Grandmother     enalraged heart  . Heart attack Maternal Grandfather     In fifties, died.  Marland Kitchen Heart attack Paternal Grandmother     In forties.  Marland Kitchen Heart attack Paternal Grandfather   . Diabetes Neg Hx     Social History Social History  Substance Use Topics  . Smoking status: Never Smoker  . Smokeless tobacco: Never Used  . Alcohol use Yes     Comment: rarely     Allergies   Ivp dye [iodinated diagnostic agents] and Codeine   Review of Systems Review of Systems  Constitutional: Negative for fever.  Respiratory: Negative for cough.   Gastrointestinal: Positive for abdominal pain, diarrhea, nausea and vomiting. Negative for blood in stool.  Genitourinary: Negative for dysuria.  All other systems reviewed and are negative.    Physical Exam Updated Vital Signs BP (!) 137/104 (BP Location: Right Arm)   Pulse 108   Temp 97.7 F (36.5 C) (Oral)   Resp 18   SpO2 100%   Physical Exam  Constitutional: She is oriented to person, place, and time. She appears well-developed and well-nourished.  HENT:  Head: Normocephalic and atraumatic.  Eyes: EOM are normal. Pupils are equal, round, and reactive to light.  Neck: Normal range of motion. Neck supple. No JVD present.  Cardiovascular: Regular rhythm and normal heart sounds.  Tachycardia present.   No murmur heard. Pulmonary/Chest: Effort normal and breath sounds normal. She has no wheezes. She has no rales. She exhibits no tenderness.  Abdominal: Soft. Bowel sounds are normal. She exhibits distension. She exhibits no mass. There is tenderness. There is no rebound and no guarding.  Mild abdominal distension and mild diffuse tenderness.  Musculoskeletal: Normal range of motion. She exhibits no edema.  Lymphadenopathy:    She has no  cervical adenopathy.  Neurological: She is alert and oriented to person, place, and time. No cranial nerve deficit. She exhibits normal muscle tone. Coordination normal.  Skin: Skin is warm and dry. No rash noted.  Psychiatric: She has a normal mood and affect. Her behavior is normal. Judgment and thought content normal.  Nursing note and vitals reviewed.    ED Treatments / Results  DIAGNOSTIC STUDIES: Oxygen Saturation is 100% on RA, normal by my interpretation.    COORDINATION OF CARE: 9:39 PM Discussed treatment plan with pt at bedside which includes blood work and IV fluids and pt agreed to plan.  Labs (all labs ordered are listed, but only abnormal results are displayed) Labs Reviewed  URINALYSIS, ROUTINE W REFLEX MICROSCOPIC (NOT AT [facility]) - Abnormal; Notable for the following:       Result Value   Color, Urine AMBER (*)    APPearance CLOUDY (*)    Hgb urine dipstick MODERATE (*)    Bilirubin Urine SMALL (*)    Ketones, ur >80 (*)    Protein, ur 30 (*)    Leukocytes, UA SMALL (*)    All other components within normal limits  URINE MICROSCOPIC-ADD ON - Abnormal; Notable for the following:    Squamous Epithelial / LPF 0-5 (*)    Bacteria, UA RARE (*)    Crystals CA OXALATE CRYSTALS (*)    All other components within normal limits  CBC WITH DIFFERENTIAL/PLATELET - Abnormal; Notable for the following:    Neutro Abs 8.3 (*)    All other components within normal limits  COMPREHENSIVE METABOLIC PANEL - Abnormal; Notable for the following:    Potassium 3.4 (*)    Glucose, Bld 118 (*)    All other components within normal limits  LIPASE, BLOOD    EKG  EKG Interpretation None       Radiology No results found.  Procedures Procedures (including critical care time)  Medications Ordered in ED Medications - No data to display  Initial Impression / Assessment and Plan / ED Course  I have reviewed the triage vital signs and the nursing notes.  Pertinent labs &  imaging results that were available during my care of the patient were reviewed by me and considered in my medical decision making (see chart for details).  Clinical Course   Patient is a 70 year old female with a history of GERD, cholelithiasis, hypertension and prior appendectomy who presents with sudden onset of diarrhea, nausea and vomiting yesterday. Patient has been unable to tolerate by mouth's for the last 2 days. She denies any blood in her stool, recent travel or bad food exposure.  She denies any antibiotic use.  Patient has mild diffuse abdominal tenderness but no localized tenderness concerning with cholecystitis, diverticulitis or pancreatitis. Low suspicion for bowel obstruction.   CBC, CMP and UA significant only for minimal hypokalemia of 3.4 and greater than 80 ketones in her urine. After Zofran and 2 L of fluids patient feels much better. She is tolerating some by mouth's here. Could be viral in nature. Patient does have narcotics at home but does not take them daily and low suspicion for withdrawal.  Patient given Zofran for home and instructed to return for worsening symptoms or persistent symptoms, fever or change in mental status.  Final Clinical Impressions(s) / ED Diagnoses   Final diagnoses:  Nausea vomiting and diarrhea    New Prescriptions New Prescriptions   ONDANSETRON (ZOFRAN ODT) 4 MG DISINTEGRATING TABLET    Take 1 tablet (4 mg total) by mouth every 8 (eight) hours as needed for nausea or vomiting.   I personally performed the services described in this documentation, which was scribed in my presence.  The recorded information has been reviewed and considered.    [name], MD 07/19/16 [phone]

## 2016-07-19 MED ORDER — ONDANSETRON 4 MG PO TBDP: 4.0000 mg | ORAL_TABLET | Freq: Three times a day (TID) | ORAL | 0 refills | Status: DC | PRN

## 2016-07-19 NOTE — ED Notes (Signed)
Pt states the more she tries to drink, the worse she feels.  She has had half of her ginger ale without vomiting.

## 2016-07-19 NOTE — ED Notes (Signed)
Pt verbalizes understanding of d/c instructions and denies any further needs at this time. 

## 2016-08-02 ENCOUNTER — Other Ambulatory Visit (INDEPENDENT_AMBULATORY_CARE_PROVIDER_SITE_OTHER): Payer: Medicare Other

## 2016-08-02 ENCOUNTER — Telehealth: Payer: Self-pay | Admitting: Family Medicine

## 2016-08-02 ENCOUNTER — Other Ambulatory Visit: Payer: Medicare Other

## 2016-08-02 DIAGNOSIS — I10 Essential (primary) hypertension: Secondary | ICD-10-CM

## 2016-08-02 DIAGNOSIS — M8580 Other specified disorders of bone density and structure, unspecified site: Secondary | ICD-10-CM

## 2016-08-02 DIAGNOSIS — K219 Gastro-esophageal reflux disease without esophagitis: Secondary | ICD-10-CM | POA: Diagnosis not present

## 2016-08-02 DIAGNOSIS — E785 Hyperlipidemia, unspecified: Secondary | ICD-10-CM

## 2016-08-02 LAB — CBC WITH DIFFERENTIAL/PLATELET
BASOS PCT: 0.7 % (ref 0.0–3.0)
Basophils Absolute: 0 10*3/uL (ref 0.0–0.1)
EOS PCT: 2 % (ref 0.0–5.0)
Eosinophils Absolute: 0.1 10*3/uL (ref 0.0–0.7)
HEMATOCRIT: 41.8 % (ref 36.0–46.0)
HEMOGLOBIN: 13.8 g/dL (ref 12.0–15.0)
Lymphocytes Relative: 24.5 % (ref 12.0–46.0)
Lymphs Abs: 1.7 10*3/uL (ref 0.7–4.0)
MCHC: 33.1 g/dL (ref 30.0–36.0)
MCV: 89.6 fl (ref 78.0–100.0)
MONOS PCT: 9 % (ref 3.0–12.0)
Monocytes Absolute: 0.6 10*3/uL (ref 0.1–1.0)
Neutro Abs: 4.3 10*3/uL (ref 1.4–7.7)
Neutrophils Relative %: 63.8 % (ref 43.0–77.0)
Platelets: 269 10*3/uL (ref 150.0–400.0)
RBC: 4.67 Mil/uL (ref 3.87–5.11)
RDW: 13.8 % (ref 11.5–15.5)
WBC: 6.8 10*3/uL (ref 4.0–10.5)

## 2016-08-02 LAB — LIPID PANEL
CHOLESTEROL: 193 mg/dL (ref 0–200)
HDL: 74 mg/dL (ref >39.00)
LDL Cholesterol: 101 mg/dL — ABNORMAL HIGH (ref 0–99)
NONHDL: 119.16
TRIGLYCERIDES: 93 mg/dL (ref 0.0–149.0)
Total CHOL/HDL Ratio: 3
VLDL: 18.6 mg/dL (ref 0.0–40.0)

## 2016-08-02 LAB — COMPREHENSIVE METABOLIC PANEL
ALBUMIN: 4.6 g/dL (ref 3.5–5.2)
ALK PHOS: 68 U/L (ref 39–117)
ALT: 10 U/L (ref 0–35)
AST: 18 U/L (ref 0–37)
BUN: 18 mg/dL (ref 6–23)
CALCIUM: 10.2 mg/dL (ref 8.4–10.5)
CO2: 30 mEq/L (ref 19–32)
Chloride: 102 mEq/L (ref 96–112)
Creatinine, Ser: 0.81 mg/dL (ref 0.40–1.20)
GFR: 74.16 mL/min (ref >60.00)
Glucose, Bld: 107 mg/dL — ABNORMAL HIGH (ref 70–99)
POTASSIUM: 4.8 meq/L (ref 3.5–5.1)
Sodium: 139 mEq/L (ref 135–145)
TOTAL PROTEIN: 7.4 g/dL (ref 6.0–8.3)
Total Bilirubin: 0.6 mg/dL (ref 0.2–1.2)

## 2016-08-02 LAB — TSH: TSH: 1.68 u[IU]/mL (ref 0.35–4.50)

## 2016-08-02 NOTE — Telephone Encounter (Signed)
Mailed the patient her hospital results and results from today. Patient informed results requested mailed to her.

## 2016-08-02 NOTE — Telephone Encounter (Signed)
Caller name: LATIFHA RASHID Relation to pt: self Call back number: [phone] Pharmacy:  Reason for call: Pt came office to have labs done 08-02-16, pt would like to have her lab results for today 08-02-16 and her lab results from ER on (07-18-16) mailed to home address. Pt states is having issues with MYCHART since she got a new computer and is not able to log into mychart. Please advise.

## 2016-08-23 ENCOUNTER — Encounter: Payer: Self-pay | Admitting: Family Medicine

## 2016-08-23 ENCOUNTER — Ambulatory Visit (INDEPENDENT_AMBULATORY_CARE_PROVIDER_SITE_OTHER): Payer: Medicare Other | Admitting: Family Medicine

## 2016-08-23 VITALS — BP 122/76 | HR 71 | Temp 97.9°F | Ht 68.0 in | Wt 184.0 lb

## 2016-08-23 DIAGNOSIS — Z1231 Encounter for screening mammogram for malignant neoplasm of breast: Secondary | ICD-10-CM

## 2016-08-23 DIAGNOSIS — Z0000 Encounter for general adult medical examination without abnormal findings: Secondary | ICD-10-CM

## 2016-08-23 DIAGNOSIS — K219 Gastro-esophageal reflux disease without esophagitis: Secondary | ICD-10-CM | POA: Diagnosis not present

## 2016-08-23 DIAGNOSIS — I10 Essential (primary) hypertension: Secondary | ICD-10-CM

## 2016-08-23 DIAGNOSIS — E782 Mixed hyperlipidemia: Secondary | ICD-10-CM | POA: Diagnosis not present

## 2016-08-23 DIAGNOSIS — R413 Other amnesia: Secondary | ICD-10-CM

## 2016-08-23 DIAGNOSIS — R739 Hyperglycemia, unspecified: Secondary | ICD-10-CM

## 2016-08-23 DIAGNOSIS — K449 Diaphragmatic hernia without obstruction or gangrene: Secondary | ICD-10-CM

## 2016-08-23 DIAGNOSIS — Z1239 Encounter for other screening for malignant neoplasm of breast: Secondary | ICD-10-CM

## 2016-08-23 DIAGNOSIS — M8580 Other specified disorders of bone density and structure, unspecified site: Secondary | ICD-10-CM | POA: Diagnosis not present

## 2016-08-23 HISTORY — DX: Hyperglycemia, unspecified: R73.9

## 2016-08-23 LAB — HEMOGLOBIN A1C: Hgb A1c MFr Bld: 5.6 % (ref 4.6–6.5)

## 2016-08-23 LAB — HEPATITIS C ANTIBODY: HCV Ab: NEGATIVE

## 2016-08-23 MED ORDER — ZOSTER VACCINE LIVE 19400 UNT/0.65ML ~~LOC~~ SUSR: 0.6500 mL | Freq: Once | SUBCUTANEOUS | 0 refills | Status: AC

## 2016-08-23 NOTE — Progress Notes (Signed)
Patient ID: Madeline [name], female   DOB: 1946/06/14, 70 y.o.   MRN: [id number]   Subjective:    Patient ID: Madeline [name], female    DOB: 24-Mar-1946, 70 y.o.   MRN: [id number]  Chief Complaint  Patient presents with  . Annual Exam    No major concerns at this time.    HPI Patient is in today for annual preventative exam and follow up on numerous medical concerns. She is under a great deal of stress managing her paranoid shizophrenic brothers concerns since her mother died. He has recently been in and out of hospital due to pneumonia. She feels well and denies any hospitalizations or acute illnesses herself. Her memory is stable but it continues to bother her that she forgets things. Is doing well with ADLs and heart healthy diet most days. Denies CP/palp/SOB/HA/congestion/fevers/GI or GU c/o. Taking meds as prescribed  Past Medical History:  Diagnosis Date  . Anxiety   . Arthritis 09-14-11   osteoarthritis, osteopenia  . Cataracts, both eyes 09-13-11   not surgical yet  . Cholelithiasis 05/17/2016  . Chronic neck pain 05/29/2016  . Cystitis 2007   sepsis post bladder biopsy, Dr Jeffie Pollock  . Edema 09-14-11   occ. ankles/ feet > left leg  . Fibromyalgia 09-14-11   Dr Donney [facility]  . Fractures 09-14-11   toes-left foot  . GERD (gastroesophageal reflux disease) 09-14-11   tx. Nexium  . H/O foot surgery 12/19/2011  . Headache(784.0) 09-13-11   less frequent to none at present  . Heart murmur 09-13-11  . Hiatal hernia 05/17/2016  . Hiatal hernia with gastroesophageal reflux 12/21/2007   Qualifier: Diagnosis of  By: Linna Darner MD, Gwyndolyn Saxon    10/09/14 dysphagia >1 X /month   . History of knee replacement 09/21/2011   left  . Hyperglycemia 08/23/2016  . Hyperlipidemia   . Hypertension 09-13-11   tx. meds  . 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 Preventative health care 08/23/2016  . Strep throat 09-14-11   none in a yr.  . TMJ click [id number]   right side >  left  . 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 ABDOMINAL HYSTERECTOMY  1977   dysfunctional menses; age 22  . APPENDECTOMY  1963  . COLONOSCOPY  2011   negative  . CYSTOSTOMY W/ BLADDER BIOPSY  04-2006  . FOOT ARTHRODESIS  01/03/12   [facility]-- left hallux MP joint arthrodesis  . HAMMER TOE SURGERY  01/03/12   [facility], Dr Doran Durand  . NASAL SINUS SURGERY  09-14-11   right side " sinus lift"  . SIGMOIDOSCOPY    . sling procedure  2006   a and p repair  . TONSILLECTOMY    . TOTAL KNEE ARTHROPLASTY  09/21/2011   Procedure: TOTAL KNEE ARTHROPLASTY;  Surgeon: Gearlean Alf;  Location: [facility];  Service: Orthopedics;  Laterality: Left;  . TOTAL KNEE ARTHROPLASTY  06/25/2012   Procedure: TOTAL KNEE ARTHROPLASTY;  Surgeon: Gearlean Alf, MD;  Location: [facility];  Service: Orthopedics;  Laterality: Right;    Family History  Problem Relation Age of Onset  . Aortic aneurysm Father     femoral aneurysm  . Mental illness Brother     paranoid schizophrenia  . Pneumonia Brother     pneumothorax in [place]  . Heart attack  both GF;PGM; M uncle  . Stroke Paternal Aunt     aneurysm  . Colon cancer Paternal Aunt     X 2  . Other Mother     lung tumor which resolved w/o treatment  . Hypertension Mother   . Other Maternal Grandmother     enalraged heart  . Hypertension Maternal Grandmother   . Heart attack Maternal Grandfather     In fifties, died.  . Hypertension Maternal Grandfather   . Heart attack Paternal Grandmother     In forties.  . Hypertension Paternal Grandmother   . Heart attack Paternal Grandfather   . Heart disease Paternal Grandfather   . Hypertension Paternal Grandfather   . Hypertension Daughter   . Hypertension Son   . Diabetes Neg Hx     Social History   Social History  . Marital status: Married    Spouse name: N/A  . Number of children: N/A  . Years  of education: N/A   Occupational History  . Not on file.   Social History Main Topics  . Smoking status: Never Smoker  . Smokeless tobacco: Never Used  . Alcohol use Yes     Comment: rarely  . Drug use: No  . Sexual activity: No   Other Topics Concern  . Not on file   Social History Narrative   reg exercise 2 x weekly with trainer, no dietary restrictions. Trying to eat heart healthy.       Retired from [employer].    Outpatient Medications Prior to Visit  Medication Sig Dispense Refill  . aspirin EC 81 MG tablet Take 81 mg by mouth daily.    Marland Kitchen CALCIUM PO Take 600 mg by mouth 2 (two) times daily.     . Cholecalciferol (VITAMIN D3) 2000 UNITS TABS Take 2,000 Units by mouth.     . cyclobenzaprine (FLEXERIL) 10 MG tablet Take 20 mg by mouth at bedtime.    Marland Kitchen diltiazem (DILACOR XR) 180 MG 24 hr capsule Take 1 capsule (180 mg total) by mouth at bedtime. 90 capsule 3  . fosinopril (MONOPRIL) 10 MG tablet Take 1 tablet (10 mg total) by mouth at bedtime. 90 tablet 3  . meloxicam (MOBIC) 7.5 MG tablet Take 7.5 mg by mouth daily. Every morning    . Multiple Vitamin (MULTI-DAY PO) Take by mouth daily.    Marland Kitchen oxyCODONE (OXY IR/ROXICODONE) 5 MG immediate release tablet Take 1 tablet by mouth every 6 (six) hours as needed.    . pravastatin (PRAVACHOL) 40 MG tablet Take 1 tablet (40 mg total) by mouth at bedtime. 90 tablet 3  . ranitidine (ZANTAC) 150 MG capsule Take 150 mg by mouth 2 (two) times daily.     . tizanidine (ZANAFLEX) 2 MG capsule Take 4 mg by mouth at bedtime.    . traMADol (ULTRAM) 50 MG tablet Take 2 tablets by mouth every 6 (six) hours as needed. Not to exceed 6 tablets a day    . DULoxetine (CYMBALTA) 20 MG capsule Take 1 capsule (20 mg total) by mouth daily. (Patient not taking: Reported on 08/23/2016) 30 capsule 2  . DULoxetine (CYMBALTA) 30 MG capsule Take 1 capsule (30 mg total) by mouth daily. (Patient not taking: Reported on 08/23/2016) 30 capsule 1  . ondansetron (ZOFRAN  ODT) 4 MG disintegrating tablet Take 1 tablet (4 mg total) by mouth every 8 (eight) hours as needed for nausea or vomiting. (Patient not taking: Reported on 08/23/2016) 20 tablet 0  . pregabalin (  LYRICA) 75 MG capsule Take 75 mg by mouth at bedtime. Pt wants no generic     No facility-administered medications prior to visit.     Allergies  Allergen Reactions  . Ivp Dye [Iodinated Diagnostic Agents]     Rash , hives  . Codeine Itching    Unless something given to counteract itching    Review of Systems  Constitutional: Negative for chills, fever and malaise/fatigue.  HENT: Negative for congestion and hearing loss.   Eyes: Negative for discharge.  Respiratory: Negative for cough, sputum production and shortness of breath.   Cardiovascular: Negative for chest pain, palpitations and leg swelling.  Gastrointestinal: Negative for abdominal pain, blood in stool, constipation, diarrhea, heartburn, nausea and vomiting.  Genitourinary: Negative for dysuria, frequency, hematuria and urgency.  Musculoskeletal: Negative for back pain, falls and myalgias.  Skin: Negative for rash.  Neurological: Negative for dizziness, sensory change, loss of consciousness, weakness and headaches.  Endo/Heme/Allergies: Negative for environmental allergies. Does not bruise/bleed easily.  Psychiatric/Behavioral: Positive for memory loss. Negative for depression and suicidal ideas. The patient is not nervous/anxious and does not have insomnia.        Objective:    Physical Exam  Constitutional: She is oriented to person, place, and time. She appears well-developed and well-nourished. No distress.  HENT:  Head: Normocephalic and atraumatic.  Eyes: Conjunctivae are normal.  Neck: Neck supple. No thyromegaly present.  Cardiovascular: Normal rate, regular rhythm and normal heart sounds.   No murmur heard. Pulmonary/Chest: Effort normal and breath sounds normal. No respiratory distress.  Abdominal: Soft. Bowel  sounds are normal. She exhibits no distension and no mass. There is no tenderness.  Musculoskeletal: She exhibits no edema.  Lymphadenopathy:    She has no cervical adenopathy.  Neurological: She is alert and oriented to person, place, and time.  Skin: Skin is warm and dry.  Psychiatric: She has a normal mood and affect. Her behavior is normal.    BP 122/76 (BP Location: Left Arm, Patient Position: Sitting, Cuff Size: Normal)   Pulse 71   Temp 97.9 F (36.6 C) (Oral)   Ht 5\' 8"  (1.727 m)   Wt 184 lb (83.5 kg)   SpO2 97% Comment: RA  BMI 27.98 kg/m  Wt Readings from Last 3 Encounters:  08/23/16 184 lb (83.5 kg)  05/17/16 190 lb 4 oz (86.3 kg)  01/08/16 192 lb 3.2 oz (87.2 kg)     Lab Results  Component Value Date   WBC 6.8 08/02/2016   HGB 13.8 08/02/2016   HCT 41.8 08/02/2016   PLT 269.0 08/02/2016   GLUCOSE 107 (H) 08/02/2016   CHOL 193 08/02/2016   TRIG 93.0 08/02/2016   HDL 74.00 08/02/2016   LDLCALC 101 (H) 08/02/2016   ALT 10 08/02/2016   AST 18 08/02/2016   NA 139 08/02/2016   K 4.8 08/02/2016   CL 102 08/02/2016   CREATININE 0.81 08/02/2016   BUN 18 08/02/2016   CO2 30 08/02/2016   TSH 1.68 08/02/2016   INR 1.02 06/15/2012    Lab Results  Component Value Date   TSH 1.68 08/02/2016   Lab Results  Component Value Date   WBC 6.8 08/02/2016   HGB 13.8 08/02/2016   HCT 41.8 08/02/2016   MCV 89.6 08/02/2016   PLT 269.0 08/02/2016   Lab Results  Component Value Date   NA 139 08/02/2016   K 4.8 08/02/2016   CO2 30 08/02/2016   GLUCOSE 107 (H) 08/02/2016   BUN  18 08/02/2016   CREATININE 0.81 08/02/2016   BILITOT 0.6 08/02/2016   ALKPHOS 68 08/02/2016   AST 18 08/02/2016   ALT 10 08/02/2016   PROT 7.4 08/02/2016   ALBUMIN 4.6 08/02/2016   CALCIUM 10.2 08/02/2016   ANIONGAP 9 07/18/2016   GFR 74.16 08/02/2016   Lab Results  Component Value Date   CHOL 193 08/02/2016   Lab Results  Component Value Date   HDL 74.00 08/02/2016   Lab  Results  Component Value Date   LDLCALC 101 (H) 08/02/2016   Lab Results  Component Value Date   TRIG 93.0 08/02/2016   Lab Results  Component Value Date   CHOLHDL 3 08/02/2016   No results found for: HGBA1C     Assessment & Plan:   Problem List Items Addressed This Visit    Hyperlipidemia    Tolerating statin, encouraged heart healthy diet, avoid trans fats, minimize simple carbs and saturated fats. Increase exercise as tolerated      Essential hypertension    Well controlled, no changes to meds. Encouraged heart healthy diet such as the DASH diet and exercise as tolerated.       Hiatal hernia with gastroesophageal reflux    Avoid offending foods, start probiotics. Do not eat large meals in late evening and consider raising head of bed.       Osteopenia    Encouraged to get adequate exercise, calcium and vitamin d intake      Relevant Orders   DG Bone Density   Memory loss    Has previously evaluated by Dr Erling Cruz before retirement, no obvious cause and no progression per patient since then. Did have neuropsych eval maybe 6 + years ago that inconclusive and she is offered new eval and declines for now. Has stopped Lyrica for now and wants to see if that helps her memory.      Preventative health care    Patient encouraged to maintain heart healthy diet, regular exercise, adequate sleep. Consider daily probiotics. Take medications as prescribed. Patient has an appointment with lawyer for ACP documents advised to provide [place] with a copy      Relevant Orders   Hepatitis C Antibody   Hyperglycemia   Relevant Orders   Hemoglobin A1c    Other Visit Diagnoses    Breast cancer screening    -  Primary   Relevant Orders   MM DIGITAL SCREENING BILATERAL      I have discontinued Ms. Moudy's pregabalin and ondansetron. I am also having her start on Zoster Vaccine Live (PF). Additionally, I am having her maintain her Vitamin D3, Multiple Vitamin (MULTI-DAY PO), CALCIUM PO,  meloxicam, tizanidine, cyclobenzaprine, aspirin EC, ranitidine, oxyCODONE, traMADol, diltiazem, fosinopril, pravastatin, and DULoxetine.  Meds ordered this encounter  Medications  . DULoxetine (CYMBALTA) 60 MG capsule    Sig: Take 60 mg by mouth daily.  Marland Kitchen Zoster Vaccine Live, PF, (ZOSTAVAX) 24401 UNT/0.65ML injection    Sig: Inject 19,400 Units into the skin once.    Dispense:  1 each    Refill:  0     Penni Homans, MD

## 2016-08-23 NOTE — Patient Instructions (Signed)
Preventive Care 70 Years and Older, Female Preventive care refers to lifestyle choices and visits with your health care provider that can promote health and wellness. What does preventive care include?  A yearly physical exam. This is also called an annual well check.  Dental exams once or twice a year.  Routine eye exams. Ask your health care provider how often you should have your eyes checked.  Personal lifestyle choices, including:  Daily care of your teeth and gums.  Regular physical activity.  Eating a healthy diet.  Avoiding tobacco and drug use.  Limiting alcohol use.  Practicing safe sex.  Taking low-dose aspirin every day.  Taking vitamin and mineral supplements as recommended by your health care provider. What happens during an annual well check? The services and screenings done by your health care provider during your annual well check will depend on your age, overall health, lifestyle risk factors, and family history of disease. Counseling  Your health care provider may ask you questions about your:  Alcohol use.  Tobacco use.  Drug use.  Emotional well-being.  Home and relationship well-being.  Sexual activity.  Eating habits.  History of falls.  Memory and ability to understand (cognition).  Work and work environment.  Reproductive health. Screening  You may have the following tests or measurements:  Height, weight, and BMI.  Blood pressure.  Lipid and cholesterol levels. These may be checked every 5 years, or more frequently if you are over 50 years old.  Skin check.  Lung cancer screening. You may have this screening every year starting at age 55 if you have a 30-pack-year history of smoking and currently smoke or have quit within the past 15 years.  Fecal occult blood test (FOBT) of the stool. You may have this test every year starting at age 50.  Flexible sigmoidoscopy or colonoscopy. You may have a sigmoidoscopy every 5 years or  a colonoscopy every 10 years starting at age 50.  Hepatitis C blood test.  Hepatitis B blood test.  Sexually transmitted disease (STD) testing.  Diabetes screening. This is done by checking your blood sugar (glucose) after you have not eaten for a while (fasting). You may have this done every 1-3 years.  Bone density scan. This is done to screen for osteoporosis. You may have this done starting at age 65.  Mammogram. This may be done every 1-2 years. Talk to your health care provider about how often you should have regular mammograms. Talk with your health care provider about your test results, treatment options, and if necessary, the need for more tests. Vaccines  Your health care provider may recommend certain vaccines, such as:  Influenza vaccine. This is recommended every year.  Tetanus, diphtheria, and acellular pertussis (Tdap, Td) vaccine. You may need a Td booster every 10 years.  Varicella vaccine. You may need this if you have not been vaccinated.  Zoster vaccine. You may need this after age 60.  Measles, mumps, and rubella (MMR) vaccine. You may need at least one dose of MMR if you were born in 1957 or later. You may also need a second dose.  Pneumococcal 13-valent conjugate (PCV13) vaccine. One dose is recommended after age 65.  Pneumococcal polysaccharide (PPSV23) vaccine. One dose is recommended after age 65.  Meningococcal vaccine. You may need this if you have certain conditions.  Hepatitis A vaccine. You may need this if you have certain conditions or if you travel or work in places where you may be exposed to   hepatitis A.  Hepatitis B vaccine. You may need this if you have certain conditions or if you travel or work in places where you may be exposed to hepatitis B.  Haemophilus influenzae type b (Hib) vaccine. You may need this if you have certain conditions. Talk to your health care provider about which screenings and vaccines you need and how often you need  them. This information is not intended to replace advice given to you by your health care provider. Make sure you discuss any questions you have with your health care provider. Document Released: 10/02/2015 Document Revised: 05/25/2016 Document Reviewed: 07/07/2015 Elsevier Interactive Patient Education  2017 [employer]  

## 2016-08-23 NOTE — Assessment & Plan Note (Signed)
Well controlled, no changes to meds. Encouraged heart healthy diet such as the DASH diet and exercise as tolerated.  °

## 2016-08-23 NOTE — Assessment & Plan Note (Signed)
Avoid offending foods, start probiotics. Do not eat large meals in late evening and consider raising head of bed.  

## 2016-08-23 NOTE — Assessment & Plan Note (Signed)
Encouraged to get adequate exercise, calcium and vitamin d intake 

## 2016-08-23 NOTE — Assessment & Plan Note (Signed)
Tolerating statin, encouraged heart healthy diet, avoid trans fats, minimize simple carbs and saturated fats. Increase exercise as tolerated 

## 2016-08-23 NOTE — Progress Notes (Signed)
Pre visit review using our clinic review tool, if applicable. No additional management support is needed unless otherwise documented below in the visit note. 

## 2016-08-23 NOTE — Assessment & Plan Note (Signed)
Has previously evaluated by Dr Erling Cruz before retirement, no obvious cause and no progression per patient since then. Did have neuropsych eval maybe 6 + years ago that inconclusive and she is offered new eval and declines for now. Has stopped Lyrica for now and wants to see if that helps her memory.

## 2016-08-23 NOTE — Assessment & Plan Note (Addendum)
Patient encouraged to maintain heart healthy diet, regular exercise, adequate sleep. Consider daily probiotics. Take medications as prescribed. Patient has an appointment with lawyer for ACP documents advised to provide [place] with a copy

## 2016-08-29 ENCOUNTER — Ambulatory Visit (HOSPITAL_BASED_OUTPATIENT_CLINIC_OR_DEPARTMENT_OTHER)
Admission: RE | Admit: 2016-08-29 | Discharge: 2016-08-29 | Disposition: A | Discharge status: Home / Self Care | Payer: Medicare Other | Source: Ambulatory Visit | Attending: Family Medicine | Admitting: Family Medicine

## 2016-08-29 DIAGNOSIS — M8589 Other specified disorders of bone density and structure, multiple sites: Secondary | ICD-10-CM | POA: Diagnosis not present

## 2016-08-29 DIAGNOSIS — Z1231 Encounter for screening mammogram for malignant neoplasm of breast: Secondary | ICD-10-CM | POA: Diagnosis present

## 2016-08-29 DIAGNOSIS — M8580 Other specified disorders of bone density and structure, unspecified site: Secondary | ICD-10-CM

## 2016-08-29 DIAGNOSIS — Z1239 Encounter for other screening for malignant neoplasm of breast: Secondary | ICD-10-CM

## 2016-09-30 ENCOUNTER — Other Ambulatory Visit: Payer: Self-pay

## 2016-09-30 MED ORDER — FOSINOPRIL SODIUM 10 MG PO TABS: 10.0000 mg | ORAL_TABLET | Freq: Every day | ORAL | 3 refills | Status: DC

## 2017-01-16 ENCOUNTER — Telehealth: Payer: Self-pay | Admitting: Family Medicine

## 2017-01-16 ENCOUNTER — Ambulatory Visit (HOSPITAL_BASED_OUTPATIENT_CLINIC_OR_DEPARTMENT_OTHER)
Admission: RE | Admit: 2017-01-16 | Discharge: 2017-01-16 | Disposition: A | Discharge status: Home / Self Care | Payer: Medicare Other | Source: Ambulatory Visit | Attending: Medical | Admitting: Medical

## 2017-01-16 ENCOUNTER — Ambulatory Visit (INDEPENDENT_AMBULATORY_CARE_PROVIDER_SITE_OTHER): Payer: Medicare Other | Admitting: Medical

## 2017-01-16 VITALS — BP 112/74 | HR 99 | Temp 97.8°F | Resp 16 | Ht 68.0 in | Wt 182.6 lb

## 2017-01-16 DIAGNOSIS — R0602 Shortness of breath: Secondary | ICD-10-CM | POA: Diagnosis present

## 2017-01-16 DIAGNOSIS — R55 Syncope and collapse: Secondary | ICD-10-CM | POA: Diagnosis not present

## 2017-01-16 DIAGNOSIS — T148XXA Other injury of unspecified body region, initial encounter: Secondary | ICD-10-CM

## 2017-01-16 DIAGNOSIS — R0609 Other forms of dyspnea: Principal | ICD-10-CM

## 2017-01-16 DIAGNOSIS — R7989 Other specified abnormal findings of blood chemistry: Secondary | ICD-10-CM

## 2017-01-16 LAB — TROPONIN I: Troponin I: <0.01 ng/mL (ref <=0.05)

## 2017-01-16 LAB — D-DIMER, QUANTITATIVE (NOT AT ARMC): D DIMER QUANT: 0.57 ug{FEU}/mL — AB (ref <0.50)

## 2017-01-16 MED ORDER — ALBUTEROL SULFATE HFA 108 (90 BASE) MCG/ACT IN AERS: 2.0000 | INHALATION_SPRAY | Freq: Four times a day (QID) | RESPIRATORY_TRACT | 0 refills | Status: DC | PRN

## 2017-01-16 NOTE — Patient Instructions (Addendum)
For recent sob over past 6 month but worse this past week will get cxr, bnp, troponin and d-dimer.  Could go ahead and use albuterol inhaler. See if this impacts/reliefs dyspnea sensation.  If troponin positive then ED evaluation.  If d-dimer positive will try to arrange ct chest and lower ext ultrasounds stat. Latest by tomorrow. But is dyspnea worsens then advise ED.  For syncope episodes but no known cause will refer to neurologist and cardiologist. If this occurs again be seen ED at that time. This would be same day ED visit asap.  For bruising get cbc. Will also get cmp today.  Follow up in 7 days or a needed

## 2017-01-16 NOTE — Telephone Encounter (Signed)
I put in ct angio chest last night with order bilateral lower ext ultrasounds. Will you call pt and get her scheduled tomorrow morning in the am. Will you update her asap and please give me update if she is scheduled.

## 2017-01-16 NOTE — Telephone Encounter (Signed)
Patient called stating she is having increased shortness of breath and has recently fainted. Transferred to [facility].

## 2017-01-16 NOTE — Progress Notes (Signed)
Subjective:    Patient ID: Madeline [name], female    DOB: [birth year], 71 y.o.   MRN: [id number]  HPI  Pt in for history of sob that may be present for about 6 months ago per pt. Very mild low level at first. She states 3 weeks ago when she was in [place] at higher altitudes she noted was short of breath more easily. She noticed getting short of breath on short hikes.  Pt was working out 3 days a week before trip. Now she is only working out 2 weeks a day recently.  Pt also state between 3-4 weeks ago when in [place]. She states she passed out. She got up to use the bathroom. She states later got up from the floor. She is not sure how long she was unconscious. Pt states one other time in her life when she passed out. That was related to dehydration.  Most recent syncope episdoes had  No related  chest pain, no neurologic symptoms, no nausea  preceding, no sob. Not related to deydration.  Pt has been back from trip to [place] for about one week. Mild dizziness the other day exercising.  Pt saw rt leg/calf area had large bruise end of march. No pain presently. Bruise resolved. No trauma that led to that bruise.   Pt has no hx of smoking. No second hand exposoure. No hx of inhaler use or asthma. Pt weight has been stable.    Review of Systems  HENT: Negative for congestion, rhinorrhea, sinus pain, sinus pressure and sneezing.        While in [place] was having some allergy symptoms.  Respiratory: Positive for shortness of breath. Negative for apnea, cough and choking.   Gastrointestinal: Negative for abdominal pain, diarrhea, nausea and vomiting.  Musculoskeletal: Negative for gait problem, myalgias and neck stiffness.  Skin: Negative for color change and rash.  Neurological: Positive for syncope. Negative for dizziness, seizures, speech difficulty, weakness and light-headedness.       As states on hpi.  Hematological: Negative for adenopathy. Bruises/bleeds easily.    Psychiatric/Behavioral: Negative for behavioral problems and confusion.   Past Medical History:  Diagnosis Date  . Anxiety   . Arthritis 09-14-11   osteoarthritis, osteopenia  . Cataracts, both eyes 09-13-11   not surgical yet  . Cholelithiasis 05/17/2016  . Chronic neck pain 05/29/2016  . Cystitis 2007   sepsis post bladder biopsy, Dr Jeffie Pollock  . Edema 09-14-11   occ. ankles/ feet > left leg  . Fibromyalgia 09-14-11   Dr Donney Dice, [facility]  . Fractures 09-14-11   toes-left foot  . GERD (gastroesophageal reflux disease) 09-14-11   tx. Nexium  . H/O foot surgery 12/19/2011  . Headache(784.0) 09-13-11   less frequent to none at present  . Heart murmur 09-13-11  . Hiatal hernia 05/17/2016  . Hiatal hernia with gastroesophageal reflux 12/21/2007   Qualifier: Diagnosis of  By: Linna Darner MD, Gwyndolyn Saxon    10/09/14 dysphagia >1 X /month   . History of knee replacement 09/21/2011   left  . Hyperglycemia 08/23/2016  . Hyperlipidemia   . Hypertension 09-13-11   tx. meds  . 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 Preventative health care 08/23/2016  . Strep throat 09-14-11   none in a yr.  . TMJ click 65-03[birth year]54   right side > left  . Varicose vein 09-14-11   bilateral , with tenderness left shin bone near foot  Social History   Social History  . Marital status: Married    Spouse name: N/A  . Number of children: N/A  . Years of education: N/A   Occupational History  . Not on file.   Social History Main Topics  . Smoking status: Never Smoker  . Smokeless tobacco: Never Used  . Alcohol use Yes     Comment: rarely  . Drug use: No  . Sexual activity: No   Other Topics Concern  . Not on file   Social History Narrative   reg exercise 2 x weekly with trainer, no dietary restrictions. Trying to eat heart healthy.       Retired from [employer].    Past Surgical History:  Procedure Laterality Date  . ABDOMINAL HYSTERECTOMY  1977   dysfunctional menses;  age 28  . APPENDECTOMY  1963  . COLONOSCOPY  2011   negative  . CYSTOSTOMY W/ BLADDER BIOPSY  04-2006  . FOOT ARTHRODESIS  01/03/12   [facility]-- left hallux MP joint arthrodesis  . HAMMER TOE SURGERY  01/03/12   [facility], Dr Doran Durand  . NASAL SINUS SURGERY  09-14-11   right side " sinus lift"  . SIGMOIDOSCOPY    . sling procedure  2006   a and p repair  . TONSILLECTOMY    . TOTAL KNEE ARTHROPLASTY  09/21/2011   Procedure: TOTAL KNEE ARTHROPLASTY;  Surgeon: Gearlean Alf;  Location: [facility];  Service: Orthopedics;  Laterality: Left;  . TOTAL KNEE ARTHROPLASTY  06/25/2012   Procedure: TOTAL KNEE ARTHROPLASTY;  Surgeon: Gearlean Alf, MD;  Location: [facility];  Service: Orthopedics;  Laterality: Right;    Family History  Problem Relation Age of Onset  . Aortic aneurysm Father     femoral aneurysm  . Mental illness Brother     paranoid schizophrenia  . Pneumonia Brother     pneumothorax in [facility]  . Heart attack      both GF;PGM; M uncle  . Stroke Paternal Aunt     aneurysm  . Colon cancer Paternal Aunt     X 2  . Other Mother     lung tumor which resolved w/o treatment  . Hypertension Mother   . Other Maternal Grandmother     enalraged heart  . Hypertension Maternal Grandmother   . Heart attack Maternal Grandfather     In fifties, died.  . Hypertension Maternal Grandfather   . Heart attack Paternal Grandmother     In forties.  . Hypertension Paternal Grandmother   . Heart attack Paternal Grandfather   . Heart disease Paternal Grandfather   . Hypertension Paternal Grandfather   . Hypertension Daughter   . Hypertension Son   . Diabetes Neg Hx     Allergies  Allergen Reactions  . Ivp Dye [Iodinated Diagnostic Agents]     Rash , hives  . Codeine Itching    Unless something given to counteract itching    Current Outpatient Prescriptions on File Prior to Visit  Medication Sig Dispense Refill  . aspirin EC 81 MG tablet Take 81 mg by mouth daily.    Marland Kitchen CALCIUM  PO Take 600 mg by mouth 2 (two) times daily.     . Cholecalciferol (VITAMIN D3) 2000 UNITS TABS Take 2,000 Units by mouth.     . cyclobenzaprine (FLEXERIL) 10 MG tablet Take 20 mg by mouth at bedtime.    Marland Kitchen diltiazem (DILACOR XR) 180 MG 24 hr capsule Take 1 capsule (180 mg  total) by mouth at bedtime. 90 capsule 3  . DULoxetine (CYMBALTA) 60 MG capsule Take 60 mg by mouth daily.    . fosinopril (MONOPRIL) 10 MG tablet Take 1 tablet (10 mg total) by mouth at bedtime. 90 tablet 3  . meloxicam (MOBIC) 7.5 MG tablet Take 7.5 mg by mouth daily. Every morning    . Multiple Vitamin (MULTI-DAY PO) Take by mouth daily.    Marland Kitchen oxyCODONE (OXY IR/ROXICODONE) 5 MG immediate release tablet Take 1 tablet by mouth every 6 (six) hours as needed.    . pravastatin (PRAVACHOL) 40 MG tablet Take 1 tablet (40 mg total) by mouth at bedtime. 90 tablet 3  . ranitidine (ZANTAC) 150 MG capsule Take 150 mg by mouth 2 (two) times daily.     . tizanidine (ZANAFLEX) 2 MG capsule Take 4 mg by mouth at bedtime.    . traMADol (ULTRAM) 50 MG tablet Take 2 tablets by mouth every 6 (six) hours as needed. Not to exceed 6 tablets a day     No current facility-administered medications on file prior to visit.     BP 112/74 (BP Location: Right Arm, Patient Position: Sitting, Cuff Size: Large)   Pulse 99   Temp 97.8 F (36.6 C) (Oral)   Resp 16   Ht 5\' 8"  (1.727 m)   Wt 182 lb 9.6 oz (82.8 kg)   SpO2 100%   BMI 27.76 kg/m       Objective:   Physical Exam   General  Mental Status - Alert. General Appearance - Well groomed. Not in acute distress.  Skin Rashes- No Rashes.  HEENT Head- Normal. Ear Auditory Canal - Left- Normal. Right - Normal.Tympanic Membrane- Left- Normal. Right- Normal. Eye Sclera/Conjunctiva- Left- Normal. Right- Normal. Nose & Sinuses Nasal Mucosa- Left-   Not Boggy and Congested. Right- not   Boggy and  Congested.Bilateral no  maxillary and no  frontal sinus pressure. Mouth & Throat Lips: Upper  Lip- Normal: no dryness, cracking, pallor, cyanosis, or vesicular eruption. Lower Lip-Normal: no dryness, cracking, pallor, cyanosis or vesicular eruption. Buccal Mucosa- Bilateral- No Aphthous ulcers. Oropharynx- No Discharge or Erythema. Tonsils: Characteristics- Bilateral- No Erythema or Congestion. Size/Enlargement- Bilateral- No enlargement. Discharge- bilateral-None.  Neck Neck- Supple. No Masses.   Chest and Lung Exam Auscultation: Breath Sounds:-Clear even and unlabored.  Cardiovascular Auscultation:Rythm- Regular, rate and rhythm. Murmurs & Other Heart Sounds:Ausculatation of the heart reveal- No Murmurs.  Lymphatic Head & Neck General Head & Neck Lymphatics: Bilateral: Description- No Localized lymphadenopathy.  Lower ext- negative homans signs. No pedal edema. Symmetric calves.  Abdomen- soft, nt, nd, +bs, no rebound. No abdominal bruit.     Assessment & Plan:  ekg shows sinus rhythm. Artifact on v6.   For recent sob over past 6 month but worse this past week will get cxr, bnp, troponin and d-dimer.  Could go ahead and use albuterol inhaler. See if this impacts/reliefs dyspnea sensation.  If troponin positive then ED evaluation.  If d-dimer positive will try to arrange ct chest and lower ext ultrasounds stat. Latest by tomorrow. But is dyspnea worsens then advise ED.  For syncope episodes but no known cause will refer to neurologist and cardiologist. If this occurs again be seen ED at that time. This would be same day ED visit asap.  For bruising get cbc. Will also get cmp today.  Follow up in 7 days or a needed  40 minutes spent with pt. 50% of time spent on counseling differential  diagnosis of potential cause of  Her syncope and dyspnea. Approach to work up and possible treatment.  Gioia Ranes, Percell Miller, PA-C

## 2017-01-16 NOTE — Telephone Encounter (Signed)
[facility] - Client [facility]  Patient Name: Madeline [name]  DOB: [birth year]    Initial Comment Caller states she is short of breath with when walking across the room or up stairs, recently travelled from area of higher elevation, fainted two weeks ago and sometimes feels light headed   Nurse Assessment  Nurse: Harlow Mares, RN, Suanne Marker Date/Time (Eastern Time): 01/16/2017 1:21:32 PM  Confirm and document reason for call. If symptomatic, describe symptoms. ---Caller states she is short of breath with when walking across the room or up stairs, recently travelled from area of higher elevation, fainted two weeks ago and sometimes feels light headed.  Does the patient have any new or worsening symptoms? ---Yes  Will a triage be completed? ---Yes  Related visit to physician within the last 2 weeks? ---N/A  Does the PT have any chronic conditions? (i.e. diabetes, asthma, etc.) ---Yes  List chronic conditions. ---fibromyalgia, HTN;  Is this a behavioral health or substance abuse call? ---No     Guidelines    Guideline Title Affirmed Question Affirmed Notes  Breathing Difficulty [1] MILD difficulty breathing (e.g., minimal/no SOB at rest, SOB with walking, pulse <100) AND [2] NEW-onset or WORSE than normal    Final Disposition User   See Physician within 4 Hours (or PCP triage) Harlow Mares, RN, Rhonda    Comments  Caller scheduled for appt today at 3:15p with Mackie Pai at the [facility]. Caller voiced understanding.   Referrals  REFERRED TO PCP OFFICE   Disagree/Comply: Comply

## 2017-01-16 NOTE — Progress Notes (Signed)
Pre visit review using our clinic review tool, if applicable. No additional management support is needed unless otherwise documented below in the visit note. 

## 2017-01-17 ENCOUNTER — Emergency Department (HOSPITAL_BASED_OUTPATIENT_CLINIC_OR_DEPARTMENT_OTHER): Payer: Medicare Other

## 2017-01-17 ENCOUNTER — Encounter: Payer: Self-pay | Admitting: Neurology

## 2017-01-17 ENCOUNTER — Telehealth: Payer: Self-pay | Admitting: Behavioral Health

## 2017-01-17 ENCOUNTER — Encounter (HOSPITAL_BASED_OUTPATIENT_CLINIC_OR_DEPARTMENT_OTHER): Payer: Self-pay | Admitting: Emergency Medicine

## 2017-01-17 ENCOUNTER — Emergency Department (HOSPITAL_BASED_OUTPATIENT_CLINIC_OR_DEPARTMENT_OTHER)
Admission: EM | Admit: 2017-01-17 | Discharge: 2017-01-17 | Disposition: A | Discharge status: Home / Self Care | Payer: Medicare Other | Attending: Emergency Medicine | Admitting: Emergency Medicine

## 2017-01-17 DIAGNOSIS — Z79899 Other long term (current) drug therapy: Secondary | ICD-10-CM | POA: Insufficient documentation

## 2017-01-17 DIAGNOSIS — I10 Essential (primary) hypertension: Secondary | ICD-10-CM | POA: Diagnosis not present

## 2017-01-17 DIAGNOSIS — R0602 Shortness of breath: Secondary | ICD-10-CM | POA: Diagnosis not present

## 2017-01-17 DIAGNOSIS — M7989 Other specified soft tissue disorders: Secondary | ICD-10-CM

## 2017-01-17 DIAGNOSIS — R0600 Dyspnea, unspecified: Secondary | ICD-10-CM

## 2017-01-17 DIAGNOSIS — Z7982 Long term (current) use of aspirin: Secondary | ICD-10-CM | POA: Diagnosis not present

## 2017-01-17 LAB — CBC WITH DIFFERENTIAL/PLATELET
BASOS PCT: 0.6 % (ref 0.0–3.0)
Basophils Absolute: 0 10*3/uL (ref 0.0–0.1)
EOS PCT: 0.9 % (ref 0.0–5.0)
Eosinophils Absolute: 0.1 10*3/uL (ref 0.0–0.7)
HEMATOCRIT: 39.4 % (ref 36.0–46.0)
Hemoglobin: 13.1 g/dL (ref 12.0–15.0)
LYMPHS PCT: 24.9 % (ref 12.0–46.0)
Lymphs Abs: 1.8 10*3/uL (ref 0.7–4.0)
MCHC: 33.2 g/dL (ref 30.0–36.0)
MCV: 89.3 fl (ref 78.0–100.0)
MONOS PCT: 8.9 % (ref 3.0–12.0)
Monocytes Absolute: 0.6 10*3/uL (ref 0.1–1.0)
Neutro Abs: 4.6 10*3/uL (ref 1.4–7.7)
Neutrophils Relative %: 64.7 % (ref 43.0–77.0)
Platelets: 272 10*3/uL (ref 150.0–400.0)
RBC: 4.41 Mil/uL (ref 3.87–5.11)
RDW: 13 % (ref 11.5–15.5)
WBC: 7.1 10*3/uL (ref 4.0–10.5)

## 2017-01-17 LAB — COMPREHENSIVE METABOLIC PANEL
ALBUMIN: 4.4 g/dL (ref 3.5–5.2)
ALT: 11 U/L (ref 0–35)
AST: 19 U/L (ref 0–37)
Alkaline Phosphatase: 65 U/L (ref 39–117)
BUN: 22 mg/dL (ref 6–23)
CALCIUM: 10.1 mg/dL (ref 8.4–10.5)
CHLORIDE: 103 meq/L (ref 96–112)
CO2: 29 mEq/L (ref 19–32)
CREATININE: 0.81 mg/dL (ref 0.40–1.20)
GFR: 74.06 mL/min (ref >60.00)
Glucose, Bld: 91 mg/dL (ref 70–99)
POTASSIUM: 4.9 meq/L (ref 3.5–5.1)
Sodium: 138 mEq/L (ref 135–145)
Total Bilirubin: 0.5 mg/dL (ref 0.2–1.2)
Total Protein: 7.2 g/dL (ref 6.0–8.3)

## 2017-01-17 LAB — BRAIN NATRIURETIC PEPTIDE: Pro B Natriuretic peptide (BNP): 15 pg/mL (ref 0.0–100.0)

## 2017-01-17 MED ORDER — SODIUM CHLORIDE 0.9 % IV BOLUS (SEPSIS)
500.0000 mL | Freq: Once | INTRAVENOUS | Status: AC
  Administered 2017-01-17: 500 mL via INTRAVENOUS

## 2017-01-17 MED ORDER — DIPHENHYDRAMINE HCL 50 MG/ML IJ SOLN: 50.0000 mg | Freq: Once | INTRAMUSCULAR | Status: DC

## 2017-01-17 MED ORDER — DIPHENHYDRAMINE HCL 50 MG/ML IJ SOLN
50.0000 mg | Freq: Once | INTRAMUSCULAR | Status: AC
  Administered 2017-01-17: 50 mg via INTRAVENOUS
  Filled 2017-01-17: qty 1

## 2017-01-17 MED ORDER — IOPAMIDOL (ISOVUE-370) INJECTION 76%
100.0000 mL | Freq: Once | INTRAVENOUS | Status: AC | PRN
  Administered 2017-01-17: 100 mL via INTRAVENOUS

## 2017-01-17 MED ORDER — HYDROCORTISONE NA SUCCINATE PF 100 MG IJ SOLR
200.0000 mg | Freq: Once | INTRAMUSCULAR | Status: AC
  Administered 2017-01-17: 200 mg via INTRAVENOUS
  Filled 2017-01-17: qty 4

## 2017-01-17 NOTE — Telephone Encounter (Signed)
[facility] note received via fax  Call Date: 01/16/17 Time: 10:56 PM   Caller: Velna Hatchet at [facility] Return number: [phone]  Nurse: Gardenia Phlegm, RN  Chief Complaint: Lab Result (Critical or STAT)  Reason for call: Report lab results  Disposition: Clinical call  Per the patient's chart, Mackie Pai, PA-C has notified her of the above results. Also, patient is currently being seen at the ED.

## 2017-01-17 NOTE — ED Notes (Signed)
Pt returned from [place]

## 2017-01-17 NOTE — ED Provider Notes (Signed)
[facility]PT [facility] Provider Note   CSN: [id number] Arrival date & time: 01/17/17  1030     History   Chief Complaint Chief Complaint  Patient presents with  . Shortness of Breath    HPI Madeline [name] is a 71 y.o. female.  Patient with history of arthritis, chronic leg edema, fibromyalgia, reflux, hiatal hernia presents for further workup of shortness of breath. Patient's had intermittent shortness of breath with exertion worsening for the past few months. Patient had blood work done in the clinic and had a d-dimer that was positive yesterday and was sent in for further evaluation. Patient denies classic risk factors for blood clots and denies any chest pain. No shortness breath currently however with exertion she does get shortness of breath. Patient has not seen cardiology yet for this. Patient did have an episode of syncope in the past however had no chest pain or shortness of breath. No cardiac history known. No current leg edema. No calf tenderness. Patient was recently in his own on medication however she had shortness of breath prior to this trip.      Past Medical History:  Diagnosis Date  . Anxiety   . Arthritis 09-14-11   osteoarthritis, osteopenia  . Cataracts, both eyes 09-13-11   not surgical yet  . Cholelithiasis 05/17/2016  . Chronic neck pain 05/29/2016  . Cystitis 2007   sepsis post bladder biopsy, Dr Jeffie Pollock  . Edema 09-14-11   occ. ankles/ feet > left leg  . Fibromyalgia 09-14-11   Dr Donney Dice, [facility]  . Fractures 09-14-11   toes-left foot  . GERD (gastroesophageal reflux disease) 09-14-11   tx. Nexium  . H/O foot surgery 12/19/2011  . Headache(784.0) 09-13-11   less frequent to none at present  . Heart murmur 09-13-11  . Hiatal hernia 05/17/2016  . Hiatal hernia with gastroesophageal reflux 12/21/2007   Qualifier: Diagnosis of  By: Linna Darner MD, Gwyndolyn Saxon    10/09/14 dysphagia >1 X /month   . History of knee replacement 09/21/2011   left  .  Hyperglycemia 08/23/2016  . Hyperlipidemia   . Hypertension 09-13-11   tx. meds  . 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 Preventative health care 08/23/2016  . Strep throat 09-14-11   none in a yr.  . TMJ click 02-40-97   right side > left  . 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 Preventative health care 08/23/2016  . Hyperglycemia 08/23/2016  . Aortic atherosclerosis ([place]) 05/29/2016  . Chronic neck pain 05/29/2016  . Gallstone 05/17/2016  . Microscopic hematuria 01/08/2016  . OA (osteoarthritis) of knee 06/25/2012  . 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 Osteopenia 03/05/2008  . Hyperlipidemia 12/21/2007  . Essential hypertension 12/21/2007  . Hiatal hernia with gastroesophageal reflux 12/21/2007  . CONSTIPATION, CHRONIC 12/21/2007  . FIBROMYALGIA 12/21/2007  . Memory loss 12/21/2007    Past Surgical History:  Procedure Laterality Date  . ABDOMINAL HYSTERECTOMY  1977   dysfunctional menses; age 6  . APPENDECTOMY  1963  . COLONOSCOPY  2011   negative  . CYSTOSTOMY W/ BLADDER BIOPSY  04-2006  . FOOT ARTHRODESIS  01/03/12   [facility]-- left hallux MP joint arthrodesis  . HAMMER TOE SURGERY  01/03/12   [facility], Dr Doran Durand  . NASAL SINUS SURGERY  09-14-11   right side "  sinus lift"  . SIGMOIDOSCOPY    . sling procedure  2006   a and p repair  . TONSILLECTOMY    . TOTAL KNEE ARTHROPLASTY  09/21/2011   Procedure: TOTAL KNEE ARTHROPLASTY;  Surgeon: Gearlean Alf;  Location: [facility];  Service: Orthopedics;  Laterality: Left;  . TOTAL KNEE ARTHROPLASTY  06/25/2012   Procedure: TOTAL KNEE ARTHROPLASTY;  Surgeon: Gearlean Alf, MD;  Location: [facility];  Service: Orthopedics;  Laterality: Right;    OB History    No data available       Home Medications    Prior to Admission medications   Medication Sig Start Date End Date  Taking? Authorizing Provider  albuterol (PROVENTIL HFA;VENTOLIN HFA) 108 (90 Base) MCG/ACT inhaler Inhale 2 puffs into the lungs every 6 (six) hours as needed for wheezing or shortness of breath. 01/16/17   Mackie Pai, PA-C  aspirin EC 81 MG tablet Take 81 mg by mouth daily.    Historical Provider, MD  CALCIUM PO Take 600 mg by mouth 2 (two) times daily.     Historical Provider, MD  Cholecalciferol (VITAMIN D3) 2000 UNITS TABS Take 2,000 Units by mouth.     Historical Provider, MD  cyclobenzaprine (FLEXERIL) 10 MG tablet Take 20 mg by mouth at bedtime.    Historical Provider, MD  diltiazem (DILACOR XR) 180 MG 24 hr capsule Take 1 capsule (180 mg total) by mouth at bedtime. 05/20/16   Mosie Lukes, MD  DULoxetine (CYMBALTA) 60 MG capsule Take 60 mg by mouth daily.    Historical Provider, MD  fosinopril (MONOPRIL) 10 MG tablet Take 1 tablet (10 mg total) by mouth at bedtime. 09/30/16   Mosie Lukes, MD  meloxicam (MOBIC) 7.5 MG tablet Take 7.5 mg by mouth daily. Every morning    Historical Provider, MD  Multiple Vitamin (MULTI-DAY PO) Take by mouth daily.    Historical Provider, MD  oxyCODONE (OXY IR/ROXICODONE) 5 MG immediate release tablet Take 1 tablet by mouth every 6 (six) hours as needed. 06/04/15   Historical Provider, MD  pravastatin (PRAVACHOL) 40 MG tablet Take 1 tablet (40 mg total) by mouth at bedtime. 05/20/16   Mosie Lukes, MD  ranitidine (ZANTAC) 150 MG capsule Take 150 mg by mouth 2 (two) times daily.     Historical Provider, MD  tizanidine (ZANAFLEX) 2 MG capsule Take 4 mg by mouth at bedtime.    Historical Provider, MD  traMADol (ULTRAM) 50 MG tablet Take 2 tablets by mouth every 6 (six) hours as needed. Not to exceed 6 tablets a day 06/04/15   Historical Provider, MD    Family History Family History  Problem Relation Age of Onset  . Aortic aneurysm Father     femoral aneurysm  . Mental illness Brother     paranoid schizophrenia  . Pneumonia Brother     pneumothorax in  [facility]  . Heart attack      both GF;PGM; M uncle  . Stroke Paternal Aunt     aneurysm  . Colon cancer Paternal Aunt     X 2  . Other Mother     lung tumor which resolved w/o treatment  . Hypertension Mother   . Other Maternal Grandmother     enalraged heart  . Hypertension Maternal Grandmother   . Heart attack Maternal Grandfather     In fifties, died.  . Hypertension Maternal Grandfather   . Heart attack Paternal Grandmother     In forties.  Marland Kitchen  Hypertension Paternal Grandmother   . Heart attack Paternal Grandfather   . Heart disease Paternal Grandfather   . Hypertension Paternal Grandfather   . Hypertension Daughter   . Hypertension Son   . Diabetes Neg Hx     Social History Social History  Substance Use Topics  . Smoking status: Never Smoker  . Smokeless tobacco: Never Used  . Alcohol use Yes     Comment: rarely     Allergies   Ivp dye [iodinated diagnostic agents] and Codeine   Review of Systems Review of Systems  Constitutional: Negative for chills and fever.  HENT: Negative for congestion.   Eyes: Negative for visual disturbance.  Respiratory: Positive for shortness of breath.   Cardiovascular: Negative for chest pain.  Gastrointestinal: Negative for abdominal pain and vomiting.  Genitourinary: Negative for dysuria and flank pain.  Musculoskeletal: Negative for back pain, neck pain and neck stiffness.  Skin: Negative for rash.  Neurological: Negative for light-headedness and headaches.     Physical Exam Updated Vital Signs BP 138/88   Pulse 76   Temp 98.1 F (36.7 C) (Oral)   Resp 17   Ht 5\' 8"  (1.727 m)   SpO2 99%   BMI 27.76 kg/m   Physical Exam  Constitutional: She is oriented to person, place, and time. She appears well-developed and well-nourished.  HENT:  Head: Normocephalic and atraumatic.  Eyes: Conjunctivae are normal. Right eye exhibits no discharge. Left eye exhibits no discharge.  Neck: Normal range of motion. Neck supple. No  tracheal deviation present.  Cardiovascular: Normal rate and regular rhythm.   Pulmonary/Chest: Effort normal and breath sounds normal.  Abdominal: Soft. She exhibits no distension. There is no tenderness. There is no guarding.  Musculoskeletal: She exhibits no edema.  Neurological: She is alert and oriented to person, place, and time.  Skin: Skin is warm. No rash noted.  Psychiatric: She has a normal mood and affect.  Nursing note and vitals reviewed.    ED Treatments / Results  Labs (all labs ordered are listed, but only abnormal results are displayed) Labs Reviewed - No data to display  EKG  EKG Interpretation None       Radiology Dg Chest 2 View  Result Date: 01/16/2017 CLINICAL DATA:  Shortness of breath EXAM: CHEST  2 VIEW COMPARISON:  08/09/2013 chest radiograph. FINDINGS: Stable cardiomediastinal silhouette with normal heart size. No pneumothorax. No pleural effusion. Lungs appear clear, with no acute consolidative airspace disease and no pulmonary edema. IMPRESSION: No active cardiopulmonary disease. Electronically Signed   By: Ilona Sorrel M.D.   On: 01/16/2017 17:55    Procedures Procedures (including critical care time)  Medications Ordered in ED Medications  sodium chloride 0.9 % bolus 500 mL (0 mLs Intravenous Stopped 01/17/17 1306)  hydrocortisone sodium succinate (SOLU-CORTEF) 100 MG injection 200 mg (200 mg Intravenous Given 01/17/17 1151)  diphenhydrAMINE (BENADRYL) injection 50 mg (50 mg Intravenous Given 01/17/17 1502)     Initial Impression / Assessment and Plan / ED Course  I have reviewed the triage vital signs and the nursing notes.  Pertinent labs & imaging results that were available during my care of the patient were reviewed by me and considered in my medical decision making (see chart for details).    Patient presents for further workup of positive d-dimer and shortness breath with exertion. Currently no shortness breath or chest pain. No  unilateral swelling or calf tenderness. Plan for CT scan with contrast. Patient had a mild reaction 40  years ago. Radiology protocol steroids and then Benadryl one hour prior to 4 are weight. Patient understands this and understands the risks of pursuing this study. Patient was offered transfer to [facility] as well as however she wishes to wait have the study done here.  Reviewed recent blood work troponin was negative. CT scan pending. Patient's care be signed at follow-up CT angiogram results discussed with patient. Patient understands if negative she'll need to follow-up for stress test with cardiology/primary doctor.    Final Clinical Impressions(s) / ED Diagnoses   Final diagnoses:  Dyspnea, unspecified type    New Prescriptions New Prescriptions   No medications on file     Elnora Morrison, MD 01/17/17 1524

## 2017-01-17 NOTE — Telephone Encounter (Signed)
Order has been sent to [facility], they will contact pt directly

## 2017-01-17 NOTE — ED Triage Notes (Signed)
Pt states she has recently been on a flight to [place].  Pt c/o sob, dizziness and was sent from Mackie Pai, [place] for evaluation.  Pt states she had a D-dimer done yesterday and came back elevated.  NAD.

## 2017-01-17 NOTE — ED Notes (Signed)
Assumed care of patient from [place], [place]. Pt resting quietly. Awaiting disposition. No distress.

## 2017-01-17 NOTE — Telephone Encounter (Signed)
Notified pt that safest place in event allergy to contrast main ED. She went on to say allergy to contrast 40 yrs ago on myelogram. Rash/hive but no anaphylactic and was never intubated. Pt decided to proceed to our ED rather than [facility] main ED.  Notified Scott in [place].

## 2017-01-17 NOTE — ED Notes (Signed)
Patient transported to Ultrasound 

## 2017-01-18 ENCOUNTER — Encounter: Payer: Self-pay | Admitting: Cardiology

## 2017-01-18 NOTE — Progress Notes (Addendum)
Cardiology Office Note   Date:  01/19/2017   ID:  Madeline [name], DOB 1946-06-29, MRN [id number]  PCP:  Madeline Homans, MD  Cardiologist:   Minus Breeding, MD  Referring:  Madeline Homans, MD  Chief Complaint  Patient presents with  . Shortness of Breath      History of Present Illness: Madeline [name] is a 71 y.o. female who presents for evaluation of dyspnea.  She was in the ED two days ago for this.  I reviewed these records for this office visit.  She had a CT that was essentially unremarkable except for a small nodule.  Labs were unremarkable.  She reports that she's been short of breath with activities such as climbing the stairs at home. She can make it to the top of the stairs that she has to rest to recover for 15 minutes. She does not describe resting shortness of breath, PND or orthopnea. She does not have palpitations, presyncope or syncope. She gets reflux and it is difficult for her to sort this out from other types of pain.  She gets some discomfort with her jaw with her reflux. She typically doesn't have this if she takes Zantac. She's not had prior cardiac workup.  She does have a strong family history of early heart disease.  Of note labs, including a BNP were normal recently.    Past Medical History:  Diagnosis Date  . Anxiety   . Arthritis 09-14-11   osteoarthritis, osteopenia  . Cataracts, both eyes 09-13-11   not surgical yet  . Cholelithiasis 05/17/2016  . Chronic neck pain 05/29/2016  . Cystitis 2007   sepsis post bladder biopsy, Dr Jeffie Pollock  . Fibromyalgia 09-14-11   Dr Donney Dice, [facility]  . Fractures 09-14-11   toes-left foot  . GERD (gastroesophageal reflux disease) 09-14-11   tx. Nexium  . H/O foot surgery 12/19/2011  . Hiatal hernia 05/17/2016  . Hyperglycemia 08/23/2016  . Hyperlipidemia   . Hypertension 09-13-11   tx. meds  . 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 TMJ click 03-47-42   right side > left  . Varicose vein  09-14-11   bilateral , with tenderness left shin bone near foot    Past Surgical History:  Procedure Laterality Date  . ABDOMINAL HYSTERECTOMY  1977   dysfunctional menses; age 30  . APPENDECTOMY  1963  . COLONOSCOPY  2011   negative  . CYSTOSTOMY W/ BLADDER BIOPSY  04-2006  . FOOT ARTHRODESIS  01/03/12   [facility]-- left hallux MP joint arthrodesis  . HAMMER TOE SURGERY  01/03/12   [facility], Dr Doran Durand  . NASAL SINUS SURGERY  09-14-11   right side " sinus lift"  . SIGMOIDOSCOPY    . sling procedure  2006   a and p repair  . TONSILLECTOMY    . TOTAL KNEE ARTHROPLASTY  09/21/2011   Procedure: TOTAL KNEE ARTHROPLASTY;  Surgeon: Gearlean Alf;  Location: [facility];  Service: Orthopedics;  Laterality: Left;  . TOTAL KNEE ARTHROPLASTY  06/25/2012   Procedure: TOTAL KNEE ARTHROPLASTY;  Surgeon: Gearlean Alf, MD;  Location: [facility];  Service: Orthopedics;  Laterality: Right;     Current Outpatient Prescriptions  Medication Sig Dispense Refill  . albuterol (PROVENTIL HFA;VENTOLIN HFA) 108 (90 Base) MCG/ACT inhaler Inhale 2 puffs into the lungs every 6 (six) hours as needed for wheezing or shortness of breath. 1 Inhaler 0  . CALCIUM PO Take 600  mg by mouth 2 (two) times daily.     . Cholecalciferol (VITAMIN D3) 2000 UNITS TABS Take 2,000 Units by mouth daily.     . cyclobenzaprine (FLEXERIL) 10 MG tablet Take 20 mg by mouth at bedtime.    Marland Kitchen diltiazem (DILACOR XR) 180 MG 24 hr capsule Take 1 capsule (180 mg total) by mouth at bedtime. 90 capsule 3  . DULoxetine (CYMBALTA) 60 MG capsule Take 60 mg by mouth daily.    . fosinopril (MONOPRIL) 10 MG tablet Take 1 tablet (10 mg total) by mouth at bedtime. 90 tablet 3  . meloxicam (MOBIC) 7.5 MG tablet Take 7.5 mg by mouth daily. Every morning    . Multiple Vitamin (MULTI-DAY PO) Take by mouth daily.    Marland Kitchen oxyCODONE (OXY IR/ROXICODONE) 5 MG immediate release tablet Take 1 tablet by mouth every 6 (six) hours as needed.    . pravastatin  (PRAVACHOL) 40 MG tablet Take 1 tablet (40 mg total) by mouth at bedtime. 90 tablet 3  . ranitidine (ZANTAC) 150 MG capsule Take 150 mg by mouth 2 (two) times daily.     . tizanidine (ZANAFLEX) 2 MG capsule Take 4 mg by mouth at bedtime.    . traMADol (ULTRAM) 50 MG tablet Take 2 tablets by mouth every 6 (six) hours as needed. Not to exceed 6 tablets a day    . aspirin EC 81 MG tablet Take 81 mg by mouth daily.     No current facility-administered medications for this visit.     Allergies:   Ivp dye [iodinated diagnostic agents] and Codeine    Social History:  The patient  reports that she has never smoked. She has never used smokeless tobacco. She reports that she drinks alcohol. She reports that she does not use drugs.   Family History:  The patient's family history includes Aortic aneurysm in her father; Aortic dissection in her sister; Colon cancer in her paternal aunt; Heart attack in her maternal grandfather, paternal grandfather, and paternal grandmother; Heart disease in her paternal grandfather; Hypertension in her daughter, maternal grandfather, maternal grandmother, mother, paternal grandfather, paternal grandmother, and son; Mental illness in her brother; Other in her maternal grandmother and mother; Pneumonia in her brother; Stroke in her paternal aunt.    ROS:  Please see the history of present illness.   Otherwise, review of systems are positive for none.   All other systems are reviewed and negative.    PHYSICAL EXAM: VS:  BP 116/70 (BP Location: Left Arm, Patient Position: Sitting, Cuff Size: Normal)   Pulse 80   Ht 5\' 8"  (1.727 m)   Wt 185 lb 8 oz (84.1 kg)   BMI 28.21 kg/m  , BMI Body mass index is 28.21 kg/m. GENERAL:  Well appearing HEENT:  Pupils equal round and reactive, fundi not visualized, oral mucosa unremarkable NECK:  No jugular venous distention, waveform within normal limits, carotid upstroke brisk and symmetric, no bruits, no thyromegaly LYMPHATICS:  No  cervical, inguinal adenopathy LUNGS:  Clear to auscultation bilaterally BACK:  No CVA tenderness CHEST:  Unremarkable HEART:  PMI not displaced or sustained,S1 and S2 within normal limits, no S3, no S4, no clicks, no rubs, no murmurs ABD:  Flat, positive bowel sounds normal in frequency in pitch, no bruits, no rebound, no guarding, possible midline pulsatile mass, no hepatomegaly, no splenomegaly EXT:  2 plus pulses throughout, no edema, no cyanosis no clubbing SKIN:  No rashes no nodules NEURO:  Cranial nerves II through  XII grossly intact, motor grossly intact throughout PSYCH:  Cognitively intact, oriented to person place and time    EKG:  EKG is not ordered today. The ekg ordered today demonstrates sinus rhythm, rate 75, axis within normal limits, incomplete right bundle branch block, QTC slightly prolonged   Recent Labs: 08/02/2016: TSH 1.68 01/16/2017: ALT 11; BUN 22; Creatinine, Ser 0.81; Hemoglobin 13.1; Platelets 272.0; Potassium 4.9; Pro B Natriuretic peptide (BNP) 15.0; Sodium 138    Lipid Panel    Component Value Date/Time   CHOL 193 08/02/2016 1000   TRIG 93.0 08/02/2016 1000   HDL 74.00 08/02/2016 1000   CHOLHDL 3 08/02/2016 1000   VLDL 18.6 08/02/2016 1000   LDLCALC 101 (H) 08/02/2016 1000      Wt Readings from Last 3 Encounters:  01/19/17 185 lb 8 oz (84.1 kg)  01/16/17 182 lb 9.6 oz (82.8 kg)  08/23/16 184 lb (83.5 kg)      Other studies Reviewed: Additional studies/ records that were reviewed today include: ED recprds. Review of the above records demonstrates:  Please see elsewhere in the note.     ASSESSMENT AND PLAN:   DYSPNEA:   This certainly could be an anginal equivalent. I'm going to start with a POET (Plain Old Exercise Treadmill).    ABDOMINAL DISTENSION:  She has this finding plus first-degree family history of abdominal aneurysms. I will get an abdominal ultrasound to rule out aneurysm.  QT:  This is slightly prolonged. We will follow  this up with EKGs in the future. She's not had any syncope. She should avoid QT prolonging drugs.    Current medicines are reviewed at length with the patient today.  The patient does not have concerns regarding medicines.  The following changes have been made:  no change  Labs/ tests ordered today include:   Orders Placed This Encounter  Procedures  . EXERCISE TOLERANCE TEST     Disposition:   FU with me after the studies.     Signed, Minus Breeding, MD  01/19/2017 8:45 AM    [place]

## 2017-01-19 ENCOUNTER — Ambulatory Visit (INDEPENDENT_AMBULATORY_CARE_PROVIDER_SITE_OTHER): Payer: Medicare Other | Admitting: Cardiology

## 2017-01-19 ENCOUNTER — Encounter: Payer: Self-pay | Admitting: Cardiology

## 2017-01-19 VITALS — BP 116/70 | HR 80 | Ht 68.0 in | Wt 185.5 lb

## 2017-01-19 DIAGNOSIS — Z8249 Family history of ischemic heart disease and other diseases of the circulatory system: Secondary | ICD-10-CM | POA: Diagnosis not present

## 2017-01-19 DIAGNOSIS — R0600 Dyspnea, unspecified: Secondary | ICD-10-CM

## 2017-01-19 NOTE — Patient Instructions (Signed)
Medication Instructions:  Continue current medications  Labwork: None Ordered  Testing/Procedures: Your physician has requested that you have an exercise tolerance test. For further information please visit [email or web address]. Please also follow instruction sheet, as given.  Your physician has requested that you have an abdominal aorta duplex. During this test, an ultrasound is used to evaluate the aorta. Allow 30 minutes for this exam. Do not eat after midnight the day before and avoid carbonated beverages   Follow-Up: Your physician recommends that you schedule a follow-up appointment in: After Test   Any Other Special Instructions Will Be Listed Below (If Applicable).   If you need a refill on your cardiac medications before your next appointment, please call your pharmacy.

## 2017-01-27 ENCOUNTER — Encounter: Payer: Self-pay | Admitting: Neurology

## 2017-01-27 ENCOUNTER — Ambulatory Visit (INDEPENDENT_AMBULATORY_CARE_PROVIDER_SITE_OTHER): Payer: Medicare Other | Admitting: Neurology

## 2017-01-27 ENCOUNTER — Telehealth (HOSPITAL_COMMUNITY): Payer: Self-pay

## 2017-01-27 VITALS — BP 108/74 | HR 96 | Ht 68.0 in | Wt 182.0 lb

## 2017-01-27 DIAGNOSIS — R413 Other amnesia: Secondary | ICD-10-CM

## 2017-01-27 DIAGNOSIS — R55 Syncope and collapse: Secondary | ICD-10-CM

## 2017-01-27 NOTE — Telephone Encounter (Signed)
Encounter complete. 

## 2017-01-27 NOTE — Progress Notes (Signed)
NEUROLOGY CONSULTATION NOTE  Madeline [name] MRN: [id number] DOB: July 30, 1946  Referring provider: Mackie Pai, PA-C Primary care provider: Dr. Penni Homans  Reason for consult:  syncope  Thank you for your kind referral of Madeline [name] for consultation of the above symptoms. Although her history is well known to you, please allow me to reiterate it for the purpose of our medical record. Records and images were personally reviewed where available.  HISTORY OF PRESENT ILLNESS: This is a pleasant 71 year old right-handed woman with a history of hypertension, hyperlipidemia, fibromyalgia, presenting for evaluation of an episode of loss of consciousness last April 11 or 12. She was in [place] and woke up in the middle of the night, recalls getting up and walking around, then woke up on the floor. She did not have any warning symptoms, no dizziness or palpitations, when she came to it was still dark outside but she did not know how long she was out. There was a big bruise on her right hip, no tongue bite, urinary incontinence, or focal weakness. She reports a history of syncope with low BP when she was pregnant, but would always have a warning of seeing shades coming down, hearing distortion where she could hear but could not distinguish words. The last time she felt this way was years ago while sitting under the sun for a graduation but did not fully lose consciousness. She recalls being sick with diarrhea one time and fainting with no warning sympotms.   She denies any staring/unresponsive episodes, gaps in time, olfactory/gustatory hallucinations, deja vu, rising epigastric sensation, myoclonic jerks. She has arthritis in her neck with numbness and tingling in the left ulnar distribution in her hand. She used to have migraines but denies any recent headaches. She gets lightheaded and short of breath going up stairs and has balance issues. She reports seeing neurologist Dr. Erling Cruz in the 1990s for  problems with her memory. She could not remember fun times her family would reminisce about. She had an MRI and was told "there is something there but did not want to call it a stroke." Over the past year she stopped Nexium due to concern for side effect of memory changes, but feels Zantac does not help as much with GERD. She also stopped the Lyrica for fibromyalgia which her son begged her to stop in the summer of 2017. Sometimes she feels she is doing better cognitively, but the Lyrica was helping with her pain. There is no family history of similar symptoms. She has 3 first cousins with cerebral aneurysm. Otherwise she had a normal birth and early development.  There is no history of febrile convulsions, CNS infections such as meningitis/encephalitis, significant traumatic brain injury, neurosurgical procedures, or family history of seizures.  PAST MEDICAL HISTORY: Past Medical History:  Diagnosis Date  . Anxiety   . Arthritis 09-14-11   osteoarthritis, osteopenia  . Cataracts, both eyes 09-13-11   not surgical yet  . Cholelithiasis 05/17/2016  . Chronic neck pain 05/29/2016  . Cystitis 2007   sepsis post bladder biopsy, Dr Jeffie Pollock  . Fibromyalgia 09-14-11   Dr Donney Dice, [facility]  . Fractures 09-14-11   toes-left foot  . GERD (gastroesophageal reflux disease) 09-14-11   tx. Nexium  . H/O foot surgery 12/19/2011  . Hiatal hernia 05/17/2016  . Hyperglycemia 08/23/2016  . Hyperlipidemia   . Hypertension 09-13-11   tx. meds  . 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 TMJ  click [id number]   right side > left  . Varicose vein 09-14-11   bilateral , with tenderness left shin bone near foot    PAST SURGICAL HISTORY: Past Surgical History:  Procedure Laterality Date  . ABDOMINAL HYSTERECTOMY  1977   dysfunctional menses; age 85  . APPENDECTOMY  1963  . COLONOSCOPY  2011   negative  . CYSTOSTOMY W/ BLADDER BIOPSY  04-2006  . FOOT ARTHRODESIS  01/03/12   [facility]-- left hallux MP joint arthrodesis  . HAMMER TOE SURGERY  01/03/12   [facility], Dr Doran Durand  . NASAL SINUS SURGERY  09-14-11   right side " sinus lift"  . SIGMOIDOSCOPY    . sling procedure  2006   a and p repair  . TONSILLECTOMY    . TOTAL KNEE ARTHROPLASTY  09/21/2011   Procedure: TOTAL KNEE ARTHROPLASTY;  Surgeon: Gearlean Alf;  Location: [facility];  Service: Orthopedics;  Laterality: Left;  . TOTAL KNEE ARTHROPLASTY  06/25/2012   Procedure: TOTAL KNEE ARTHROPLASTY;  Surgeon: Gearlean Alf, MD;  Location: [facility];  Service: Orthopedics;  Laterality: Right;    MEDICATIONS: Current Outpatient Prescriptions on File Prior to Visit  Medication Sig Dispense Refill  . albuterol (PROVENTIL HFA;VENTOLIN HFA) 108 (90 Base) MCG/ACT inhaler Inhale 2 puffs into the lungs every 6 (six) hours as needed for wheezing or shortness of breath. 1 Inhaler 0  . aspirin EC 81 MG tablet Take 81 mg by mouth daily.    Marland Kitchen CALCIUM PO Take 600 mg by mouth 2 (two) times daily.     . Cholecalciferol (VITAMIN D3) 2000 UNITS TABS Take 2,000 Units by mouth daily.     . cyclobenzaprine (FLEXERIL) 10 MG tablet Take 20 mg by mouth at bedtime.    Marland Kitchen diltiazem (DILACOR XR) 180 MG 24 hr capsule Take 1 capsule (180 mg total) by mouth at bedtime. 90 capsule 3  . DULoxetine (CYMBALTA) 60 MG capsule Take 60 mg by mouth daily.    . fosinopril (MONOPRIL) 10 MG tablet Take 1 tablet (10 mg total) by mouth at bedtime. 90 tablet 3  . meloxicam (MOBIC) 7.5 MG tablet Take 7.5 mg by mouth daily. Every morning    . Multiple Vitamin (MULTI-DAY PO) Take by mouth daily.    Marland Kitchen oxyCODONE (OXY IR/ROXICODONE) 5 MG immediate release tablet Take 1 tablet by mouth every 6 (six) hours as needed.    . pravastatin (PRAVACHOL) 40 MG tablet Take 1 tablet (40 mg total) by mouth at bedtime. 90 tablet 3  . ranitidine (ZANTAC) 150 MG capsule Take 150 mg by mouth 2 (two) times daily.     . tizanidine (ZANAFLEX) 2 MG capsule Take 4 mg by mouth at bedtime.      . traMADol (ULTRAM) 50 MG tablet Take 2 tablets by mouth every 6 (six) hours as needed. Not to exceed 6 tablets a day     No current facility-administered medications on file prior to visit.     ALLERGIES: Allergies  Allergen Reactions  . Ivp Dye [Iodinated Diagnostic Agents]     Rash , hives  . Codeine Itching    Unless something given to counteract itching    FAMILY HISTORY: Family History  Problem Relation Age of Onset  . Aortic aneurysm Father        femoral aneurysm  . Mental illness Brother        paranoid schizophrenia  . Pneumonia Brother        pneumothorax in  [place]  . Heart attack Unknown        both GF;PGM; M uncle  . Stroke Paternal Aunt        aneurysm  . Colon cancer Paternal Aunt        X 2  . Other Mother        lung tumor which resolved w/o treatment  . Hypertension Mother   . Other Maternal Grandmother        enalraged heart  . Hypertension Maternal Grandmother   . Heart attack Maternal Grandfather        In fifties, died.  . Hypertension Maternal Grandfather   . Heart attack Paternal Grandmother        In forties.  . Hypertension Paternal Grandmother   . Heart attack Paternal Grandfather   . Heart disease Paternal Grandfather   . Hypertension Paternal Grandfather   . Hypertension Daughter   . Hypertension Son   . Aortic dissection Sister        descending  . Diabetes Neg Hx     SOCIAL HISTORY: Social History   Social History  . Marital status: Married    Spouse name: N/A  . Number of children: 2  . Years of education: N/A   Occupational History  . Not on file.   Social History Main Topics  . Smoking status: Never Smoker  . Smokeless tobacco: Never Used  . Alcohol use Yes     Comment: rarely  . Drug use: No  . Sexual activity: No   Other Topics Concern  . Not on file   Social History Narrative   Reg exercise 2 x weekly with trainer, no dietary restrictions. Trying to eat heart healthy.       Retired from [employer].     REVIEW OF SYSTEMS: Constitutional: No fevers, chills, or sweats, no generalized fatigue, change in appetite Eyes: No visual changes, double vision, eye pain Ear, nose and throat: No hearing loss, ear pain, nasal congestion, sore throat Cardiovascular: No chest pain, palpitations Respiratory:  No shortness of breath at rest or with exertion, wheezes GastrointestinaI: No nausea, vomiting, diarrhea, abdominal pain, fecal incontinence Genitourinary:  No dysuria, urinary retention or frequency Musculoskeletal:  No neck pain, back pain Integumentary: No rash, pruritus, skin lesions Neurological: as above Psychiatric: No depression, insomnia, anxiety Endocrine: No palpitations, fatigue, diaphoresis, mood swings, change in appetite, change in weight, increased thirst Hematologic/Lymphatic:  No anemia, purpura, petechiae. Allergic/Immunologic: no itchy/runny eyes, nasal congestion, recent allergic reactions, rashes  PHYSICAL EXAM: Vitals:   01/27/17 1518  BP: 108/74  Pulse: 96   General: No acute distress Head:  Normocephalic/atraumatic Eyes: Fundoscopic exam shows bilateral sharp discs, no vessel changes, exudates, or hemorrhages Neck: supple, no paraspinal tenderness, full range of motion Back: No paraspinal tenderness Heart: regular rate and rhythm Lungs: Clear to auscultation bilaterally. Vascular: No carotid bruits. Skin/Extremities: No rash, no edema Neurological Exam: Mental status: alert and oriented to person, place, and time, no dysarthria or aphasia, Fund of knowledge is appropriate.  Recent and remote memory are intact.  Attention and concentration are normal.    Able to name objects and repeat phrases. CDT 5/5 MMSE - Mini Mental State Exam 01/27/2017  Orientation to time 5  Orientation to Place 5  Registration 3  Attention/ Calculation 5  Recall 3  Language- name 2 objects 2  Language- repeat 1  Language- follow 3 step command 3  Language- read & follow direction 1   Write a sentence 1  Copy design 1  Total score 30   Cranial nerves: CN I: not tested CN II: pupils equal, round and reactive to light, visual fields intact, fundi unremarkable. CN III, IV, VI:  full range of motion, no nystagmus, no ptosis CN V: facial sensation intact CN VII: upper and lower face symmetric CN VIII: hearing intact to finger rub CN IX, X: gag intact, uvula midline CN XI: sternocleidomastoid and trapezius muscles intact CN XII: tongue midline Bulk & Tone: normal, no fasciculations. Motor: 5/5 throughout with no pronator drift. Sensation: intact to light touch, cold, pin, vibration and joint position sense.  No extinction to double simultaneous stimulation.  Romberg test negative Deep Tendon Reflexes: +2 throughout, no ankle clonus Plantar responses: downgoing bilaterally Cerebellar: no incoordination on finger to nose, heel to shin. No dysdiadochokinesia Gait: narrow-based and steady, difficulty with tandem walk but able Tremor: none  IMPRESSION: This is a pleasant 71 year old right-handed woman with a history of  hypertension, hyperlipidemia, fibromyalgia, presenting for evaluation of an episode of loss of consciousness last April 11 or 12. She reports a history of syncope but these were usually preceded by presyncopal symptoms, however the recent event occurred without warning, she woke up on the floor with unknown duration of loss of consciousness. She has no clear epilepsy risk factors, neurological exam non-focal. Considerations include syncope (particularly with prior history given), less likely seizure. MRI brain with and without contrast and a 1-hour EEG will be ordered to assess for focal abnormalities that increase risk for recurrent seizures. She also reports a history of memory changes, MMSE today 30/30, she feels there has been some improvement off Lyrica. [place] driving laws were discussed with the patient, would hold off on driving for now until tests complete. She  was encouraged to increase fluid hydration and liberalize salt intake. She was seen by Cardiology recently but did not report the syncopal event, she is scheduled for a treadmill test and abdominal ultrasound due to history of abdominal aneurysm. She will follow-up in 4 months and knows to call for any changes.  Thank you for allowing me to participate in the care of this patient. Please do not hesitate to call for any questions or concerns.   Ellouise Newer, M.D.  CC: Mackie Pai, PA-C; Dr. Charlett Blake, Dr. Percival Spanish

## 2017-01-27 NOTE — Patient Instructions (Addendum)
1. Schedule MRI brain without contrast 2. Schedule 1-hour EEG 3. Hold off on driving until tests complete 4. Follow-up in 4 months, call for any changes

## 2017-01-30 ENCOUNTER — Ambulatory Visit (INDEPENDENT_AMBULATORY_CARE_PROVIDER_SITE_OTHER): Payer: Medicare Other | Admitting: Neurology

## 2017-01-30 DIAGNOSIS — R413 Other amnesia: Secondary | ICD-10-CM

## 2017-01-30 DIAGNOSIS — R55 Syncope and collapse: Secondary | ICD-10-CM

## 2017-01-31 ENCOUNTER — Encounter: Payer: Self-pay | Admitting: Neurology

## 2017-01-31 DIAGNOSIS — R55 Syncope and collapse: Secondary | ICD-10-CM | POA: Insufficient documentation

## 2017-01-31 NOTE — Procedures (Signed)
ELECTROENCEPHALOGRAM REPORT  Date of Study: 01/30/2017  Patient's Name: Madeline [name] MRN: [id number] Date of Birth: [birth year]  Referring Provider: Dr. Ellouise Newer  Clinical History: This is a 71 year old woman with an episode of loss of consciousness last April 2018.   Medications: PROVENTIL HFA;VENTOLIN HFA aspirin EC 81 MG   CALCIUM    VITAMIN D3  FLEXERIL DILACOR XR CYMBALTA  MONOPRIL MOBIC  MULTI-DAY PO  OXY IR/ROXICODONE  PRAVACHOL ZANTAC ZANAFLEX  ULTRAM    Technical Summary: A multichannel digital 1-hour EEG recording measured by the international 10-20 system with electrodes applied with paste and impedances below 5000 ohms performed in our laboratory with EKG monitoring in an awake and asleep patient.  Hyperventilation and photic stimulation were performed.  The digital EEG was referentially recorded, reformatted, and digitally filtered in a variety of bipolar and referential montages for optimal display.    Description: The patient is awake and asleep during the recording.  During maximal wakefulness, there is a symmetric, medium voltage 9 Hz posterior dominant rhythm that attenuates with eye opening.  The record is symmetric.  During drowsiness and sleep, there is an increase in theta slowing of the background.  Vertex waves and symmetric sleep spindles were seen.  Hyperventilation and photic stimulation did not elicit any abnormalities.  There were no epileptiform discharges or electrographic seizures seen.    EKG lead was unremarkable.  Impression: This 1-hour awake and asleep EEG is normal.    Clinical Correlation: A normal EEG does not exclude a clinical diagnosis of epilepsy.  If further clinical questions remain, prolonged EEG may be helpful.  Clinical correlation is advised.   Ellouise Newer, M.D.

## 2017-02-01 ENCOUNTER — Ambulatory Visit (HOSPITAL_COMMUNITY)
Admission: RE | Admit: 2017-02-01 | Discharge: 2017-02-01 | Disposition: A | Discharge status: Home / Self Care | Payer: Medicare Other | Source: Ambulatory Visit | Attending: Cardiovascular Disease | Admitting: Cardiovascular Disease

## 2017-02-01 DIAGNOSIS — R0789 Other chest pain: Secondary | ICD-10-CM | POA: Diagnosis not present

## 2017-02-01 DIAGNOSIS — R0600 Dyspnea, unspecified: Secondary | ICD-10-CM | POA: Insufficient documentation

## 2017-02-01 LAB — EXERCISE TOLERANCE TEST
CHL CUP RESTING HR STRESS: 89 {beats}/min
CHL RATE OF PERCEIVED EXERTION: 18
CSEPEDS: 3 s
CSEPEW: 7 METS
Exercise duration (min): 6 min
MPHR: 149 {beats}/min
Peak HR: 141 {beats}/min
Percent HR: 94 %

## 2017-02-02 ENCOUNTER — Telehealth: Payer: Self-pay

## 2017-02-02 ENCOUNTER — Telehealth: Payer: Self-pay | Admitting: Neurology

## 2017-02-02 NOTE — Telephone Encounter (Signed)
-----   Message from Cameron Sprang, MD sent at 01/31/2017  2:56 PM EDT ----- Pls let her know the brain wave test is normal, thanks

## 2017-02-02 NOTE — Telephone Encounter (Signed)
Called pt and relayed message below.  She was in good spirits but still a little "down" about inconveniencing her husband with having him to drive her everywhere.

## 2017-02-02 NOTE — Telephone Encounter (Signed)
Called pt and relayed message below.  Pt appreciative

## 2017-02-02 NOTE — Telephone Encounter (Signed)
From neuro standpoint, would wait for MRI brain. Would also have her ask her heart doctor about the driving from a cardiac standpoint. Thanks

## 2017-02-02 NOTE — Telephone Encounter (Signed)
Patient called to see if she could drive now since results from EEG were normal? Thanks

## 2017-02-04 ENCOUNTER — Ambulatory Visit
Admission: RE | Admit: 2017-02-04 | Discharge: 2017-02-04 | Disposition: A | Discharge status: Home / Self Care | Payer: Medicare Other | Source: Ambulatory Visit | Attending: Neurology | Admitting: Neurology

## 2017-02-04 DIAGNOSIS — R55 Syncope and collapse: Secondary | ICD-10-CM

## 2017-02-04 DIAGNOSIS — R413 Other amnesia: Secondary | ICD-10-CM

## 2017-02-09 ENCOUNTER — Telehealth: Payer: Self-pay

## 2017-02-09 NOTE — Telephone Encounter (Signed)
-----   Message from Cameron Sprang, MD sent at 02/07/2017 11:26 AM EDT ----- Pls let her know I reviewed MRI brain, no evidence of tumor, stroke, or bleed. It shows changes seen in patients with blood pressure and cholesterol issues, so these need to be monitored. From neuro standpoint, ok to drive but would have her call her cardiologist as well to ask if she is clear for driving. Thanks

## 2017-02-09 NOTE — Telephone Encounter (Signed)
Called Madeline [name] and relayed message below.  She stated that her cardiologist never said anything about not driving so she does not understand why she needs his clearance.  I just let her know that Dr. Delice Lesch would prefer it that way.  Madeline [name] OK with that.

## 2017-02-10 ENCOUNTER — Ambulatory Visit (HOSPITAL_COMMUNITY)
Admission: RE | Admit: 2017-02-10 | Discharge: 2017-02-10 | Disposition: A | Discharge status: Home / Self Care | Payer: Medicare Other | Source: Ambulatory Visit | Attending: Cardiovascular Disease | Admitting: Cardiovascular Disease

## 2017-02-10 DIAGNOSIS — I714 Abdominal aortic aneurysm, without rupture: Secondary | ICD-10-CM | POA: Diagnosis not present

## 2017-02-10 DIAGNOSIS — Z8249 Family history of ischemic heart disease and other diseases of the circulatory system: Secondary | ICD-10-CM

## 2017-02-10 DIAGNOSIS — I10 Essential (primary) hypertension: Secondary | ICD-10-CM | POA: Diagnosis not present

## 2017-02-15 ENCOUNTER — Telehealth: Payer: Self-pay | Admitting: *Deleted

## 2017-02-15 NOTE — Telephone Encounter (Signed)
AWV scheduled for 02/17/17 @815  with Glenard Haring.

## 2017-02-16 NOTE — Progress Notes (Signed)
Cardiology Office Note   Date:  02/18/2017   ID:  Madeline [name], DOB 07/06/1946, MRN [id number]  PCP:  Mosie Lukes, MD  Cardiologist:   Minus Breeding, MD  Referring:  Mosie Lukes, MD  Chief Complaint  Patient presents with  . Shortness of Breath      History of Present Illness: Madeline [name] is a 71 y.o. female who presents for evaluation of dyspnea.  At the last visit I sent her for a POET (Plain Old Exercise Treadmill) which was negative for evidence of ischemia.  Since her stress test she had done well.  The patient denies any new symptoms such as chest discomfort, neck or arm discomfort. There has been no new shortness of breath, PND or orthopnea. There have been no reported palpitations, presyncope or syncope.  She has been doing some household cleaning and had no problems with this.     Past Medical History:  Diagnosis Date  . Anxiety   . Arthritis 09-14-11   osteoarthritis, osteopenia  . Cataracts, both eyes 09-13-11   not surgical yet  . Cholelithiasis 05/17/2016  . Chronic neck pain 05/29/2016  . Cystitis 2007   sepsis post bladder biopsy, Dr Jeffie Pollock  . Fibromyalgia 09-14-11   Dr Donney Dice, [facility]  . Fractures 09-14-11   toes-left foot  . GERD (gastroesophageal reflux disease) 09-14-11   tx. Nexium  . H/O foot surgery 12/19/2011  . Hiatal hernia 05/17/2016  . Hyperglycemia 08/23/2016  . Hyperlipidemia   . Hypertension 09-13-11   tx. meds  . 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 SOB (shortness of breath) 02/17/2017  . TMJ click 24-09-73   right side > left  . 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 ABDOMINAL HYSTERECTOMY  1977   dysfunctional menses; age 35  . APPENDECTOMY  1963  . COLONOSCOPY  2011   negative  . CYSTOSTOMY W/ BLADDER BIOPSY  04-2006  . FOOT ARTHRODESIS  01/03/12   [facility] [facility]-- left hallux MP joint  arthrodesis  . HAMMER TOE SURGERY  01/03/12   [facility], Dr Doran Durand  . NASAL SINUS SURGERY  09-14-11   right side " sinus lift"  . SIGMOIDOSCOPY    . sling procedure  2006   a and p repair  . TONSILLECTOMY    . TOTAL KNEE ARTHROPLASTY  09/21/2011   Procedure: TOTAL KNEE ARTHROPLASTY;  Surgeon: Gearlean Alf;  Location: [facility];  Service: Orthopedics;  Laterality: Left;  . TOTAL KNEE ARTHROPLASTY  06/25/2012   Procedure: TOTAL KNEE ARTHROPLASTY;  Surgeon: Gearlean Alf, MD;  Location: [facility];  Service: Orthopedics;  Laterality: Right;     Current Outpatient Prescriptions  Medication Sig Dispense Refill  . albuterol (PROVENTIL HFA;VENTOLIN HFA) 108 (90 Base) MCG/ACT inhaler Inhale 2 puffs into the lungs every 6 (six) hours as needed for wheezing or shortness of breath. 1 Inhaler 0  . aspirin EC 81 MG tablet Take 81 mg by mouth daily.    Marland Kitchen CALCIUM PO Take 600 mg by mouth 2 (two) times daily.     . Cholecalciferol (VITAMIN D3) 2000 UNITS TABS Take 2,000 Units by mouth daily.     . cyclobenzaprine (FLEXERIL) 10 MG tablet Take 20 mg by mouth at bedtime.    Marland Kitchen diltiazem (DILACOR XR) 180 MG 24 hr capsule Take 1 capsule (  180 mg total) by mouth at bedtime. 90 capsule 3  . DULoxetine (CYMBALTA) 60 MG capsule Take 60 mg by mouth daily.    . fosinopril (MONOPRIL) 10 MG tablet Take 1 tablet (10 mg total) by mouth at bedtime. 90 tablet 3  . meloxicam (MOBIC) 7.5 MG tablet Take 7.5 mg by mouth daily. Every morning    . Multiple Vitamin (MULTI-DAY PO) Take by mouth daily.    Marland Kitchen oxyCODONE (OXY IR/ROXICODONE) 5 MG immediate release tablet Take 1 tablet by mouth every 6 (six) hours as needed.    . pravastatin (PRAVACHOL) 40 MG tablet Take 1 tablet (40 mg total) by mouth at bedtime. 90 tablet 3  . ranitidine (ZANTAC) 150 MG capsule Take 150 mg by mouth 2 (two) times daily.     . tizanidine (ZANAFLEX) 2 MG capsule Take 4 mg by mouth at bedtime.    . traMADol (ULTRAM) 50 MG tablet Take 2 tablets by mouth every  6 (six) hours as needed. Not to exceed 6 tablets a day     No current facility-administered medications for this visit.     Allergies:   Ivp dye [iodinated diagnostic agents] and Codeine    ROS:  Please see the history of present illness.   Otherwise, review of systems are positive for none.   All other systems are reviewed and negative.    PHYSICAL EXAM: VS:  BP 126/82   Pulse 88   Ht 5\' 8"  (1.727 m)   Wt 187 lb (84.8 kg)   BMI 28.43 kg/m  , BMI Body mass index is 28.43 kg/m.  GENERAL:  Well appearing NECK:  No jugular venous distention, waveform within normal limits, carotid upstroke brisk and symmetric, no bruits, no thyromegaly LUNGS:  Clear to auscultation bilaterally CHEST:  Unremarkable HEART:  PMI not displaced or sustained,S1 and S2 within normal limits, no S3, no S4, no clicks, no rubs, no murmurs ABD:  Flat, positive bowel sounds normal in frequency in pitch, no bruits, no rebound, no guarding, no midline pulsatile mass, no hepatomegaly, no splenomegaly EXT:  2 plus pulses throughout, no edema, no cyanosis no clubbing  EKG:  EKG is ordered today. The ekg ordered today demonstrates sinus rhythm, rate 97, axis within normal limits, incomplete right bundle branch block, QTC slightly prolonged but improved.     Recent Labs: 01/16/2017: Pro B Natriuretic peptide (BNP) 15.0 02/17/2017: ALT 11; BUN 19; Creatinine, Ser 0.77; Hemoglobin 13.0; Platelets 237.0; Potassium 3.3; Sodium 142; TSH 1.95    Lipid Panel    Component Value Date/Time   CHOL 178 02/17/2017 1012   TRIG 99.0 02/17/2017 1012   HDL 76.50 02/17/2017 1012   CHOLHDL 2 02/17/2017 1012   VLDL 19.8 02/17/2017 1012   LDLCALC 82 02/17/2017 1012      Wt Readings from Last 3 Encounters:  02/17/17 187 lb (84.8 kg)  02/17/17 186 lb 12.8 oz (84.7 kg)  01/27/17 182 lb (82.6 kg)      Other studies Reviewed: Additional studies/ records that were reviewed today include:  POET (Plain Old Exercise Treadmill),  neurology office notes and test results.    Review of the above records demonstrates:   As above   ASSESSMENT AND PLAN:   DYSPNEA:   A POET (Plain Old Exercise Treadmill) recently did not demonstrate ischemia but she did have chest pain.  However, she has had no recurrent symptoms.  She reports that she actually got off of the machine because it was just going too  fast.    ABDOMINAL DISTENSION:   There was no AAA.  No further testing is indicated.   QT:  This is upper limits of normal.  I do not think that this is related to her event and the QT is shorter today.    SYNCOPE:   She has had no further events.  Given the abnormal EKG I will obtain an echo.  However, if this is normal then no further work up is indicated.      Current medicines are reviewed at length with the patient today.  The patient does not have concerns regarding medicines.  The following changes have been made:  None  Labs/ tests ordered today include:     Orders Placed This Encounter  Procedures  . EKG 12-Lead  . ECHOCARDIOGRAM COMPLETE     Disposition:   FU with me as needed.     Signed, Minus Breeding, MD  02/18/2017 6:18 PM    [place]

## 2017-02-16 NOTE — Progress Notes (Signed)
Subjective:   Madeline [name] is a 71 y.o. female who presents for Medicare Annual (Subsequent) preventive examination.  Review of Systems:  No ROS.  Medicare Wellness Visit.  Cardiac Risk Factors include: advanced age (>50men, >29 women);dyslipidemia;hypertension;sedentary lifestyle Sleep patterns: Takes flexeril and zanaflex at bedtime. Wakes 1-2x to urinate. Sleeps 7-8 hrs per night. Feels rested. Home Safety/Smoke Alarms:  Feels safe in home. Smoke alarms in place.  Living environment; residence and Firearm Safety: Lives with husband in 2 story home. Guns safely stored. Seat Belt Safety/Bike Helmet: Wears seat belt.   Counseling:   Eye Exam- hx cataract sx in left eye. Wears reading glasses. Dental- prosthodontist twice per year.  Female:   Pap-Hysterectomy.       Mammo- Last 08/29/16: BI-RADS CATEGORY  1: Negative.       Dexa scan-  Last 08/29/16: osteopenia       CCS- Last 03/18/08: Normal.    Objective:     Vitals: BP 130/84 (BP Location: Right Arm, Patient Position: Sitting, Cuff Size: Normal)   Pulse 90   Ht 5\' 8"  (1.727 m)   Wt 186 lb 12.8 oz (84.7 kg)   SpO2 99%   BMI 28.40 kg/m   Body mass index is 28.4 kg/m.   Tobacco History  Smoking Status  . Never Smoker  Smokeless Tobacco  . Never Used     Counseling given: Not Answered   Past Medical History:  Diagnosis Date  . Anxiety   . Arthritis 09-14-11   osteoarthritis, osteopenia  . Cataracts, both eyes 09-13-11   not surgical yet  . Cholelithiasis 05/17/2016  . Chronic neck pain 05/29/2016  . Cystitis 2007   sepsis post bladder biopsy, Dr Jeffie Pollock  . Fibromyalgia 09-14-11   Dr Donney Dice, [facility]  . Fractures 09-14-11   toes-left foot  . GERD (gastroesophageal reflux disease) 09-14-11   tx. Nexium  . H/O foot surgery 12/19/2011  . Hiatal hernia 05/17/2016  . Hyperglycemia 08/23/2016  . Hyperlipidemia   . Hypertension 09-13-11   tx. meds  . 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 TMJ click 46-96-29   right side > left  . 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 ABDOMINAL HYSTERECTOMY  1977   dysfunctional menses; age 13  . APPENDECTOMY  1963  . COLONOSCOPY  2011   negative  . CYSTOSTOMY W/ BLADDER BIOPSY  04-2006  . FOOT ARTHRODESIS  01/03/12   [place] [facility]-- left hallux MP joint arthrodesis  . HAMMER TOE SURGERY  01/03/12   [facility], Dr Doran Durand  . NASAL SINUS SURGERY  09-14-11   right side " sinus lift"  . SIGMOIDOSCOPY    . sling procedure  2006   a and p repair  . TONSILLECTOMY    . TOTAL KNEE ARTHROPLASTY  09/21/2011   Procedure: TOTAL KNEE ARTHROPLASTY;  Surgeon: Gearlean Alf;  Location: [facility];  Service: Orthopedics;  Laterality: Left;  . TOTAL KNEE ARTHROPLASTY  06/25/2012   Procedure: TOTAL KNEE ARTHROPLASTY;  Surgeon: Gearlean Alf, MD;  Location: [facility];  Service: Orthopedics;  Laterality: Right;   Family History  Problem Relation Age of Onset  . Aortic aneurysm Father        femoral aneurysm  . Mental illness Brother        paranoid schizophrenia  . Pneumonia Brother  pneumothorax in [place]  . Heart attack Unknown        both GF;PGM; M uncle  . Stroke Paternal Aunt        aneurysm  . Colon cancer Paternal Aunt        X 2  . Other Mother        lung tumor which resolved w/o treatment  . Hypertension Mother   . Other Maternal Grandmother        enalraged heart  . Hypertension Maternal Grandmother   . Heart attack Maternal Grandfather        In fifties, died.  . Hypertension Maternal Grandfather   . Heart attack Paternal Grandmother        In forties.  . Hypertension Paternal Grandmother   . Heart attack Paternal Grandfather   . Heart disease Paternal Grandfather   . Hypertension Paternal Grandfather   . Hypertension Daughter   . Hypertension Son   . Aortic dissection Sister        descending  . Diabetes Neg Hx     History  Sexual Activity  . Sexual activity: Yes    Outpatient Encounter Prescriptions as of 02/17/2017  Medication Sig  . aspirin EC 81 MG tablet Take 81 mg by mouth daily.  Marland Kitchen CALCIUM PO Take 600 mg by mouth 2 (two) times daily.   . Cholecalciferol (VITAMIN D3) 2000 UNITS TABS Take 2,000 Units by mouth daily.   . cyclobenzaprine (FLEXERIL) 10 MG tablet Take 20 mg by mouth at bedtime.  Marland Kitchen diltiazem (DILACOR XR) 180 MG 24 hr capsule Take 1 capsule (180 mg total) by mouth at bedtime.  . DULoxetine (CYMBALTA) 60 MG capsule Take 60 mg by mouth daily.  . fosinopril (MONOPRIL) 10 MG tablet Take 1 tablet (10 mg total) by mouth at bedtime.  . meloxicam (MOBIC) 7.5 MG tablet Take 7.5 mg by mouth daily. Every morning  . Multiple Vitamin (MULTI-DAY PO) Take by mouth daily.  Marland Kitchen oxyCODONE (OXY IR/ROXICODONE) 5 MG immediate release tablet Take 1 tablet by mouth every 6 (six) hours as needed.  . pravastatin (PRAVACHOL) 40 MG tablet Take 1 tablet (40 mg total) by mouth at bedtime.  . ranitidine (ZANTAC) 150 MG capsule Take 150 mg by mouth 2 (two) times daily.   . tizanidine (ZANAFLEX) 2 MG capsule Take 4 mg by mouth at bedtime.  . traMADol (ULTRAM) 50 MG tablet Take 2 tablets by mouth every 6 (six) hours as needed. Not to exceed 6 tablets a day  . albuterol (PROVENTIL HFA;VENTOLIN HFA) 108 (90 Base) MCG/ACT inhaler Inhale 2 puffs into the lungs every 6 (six) hours as needed for wheezing or shortness of breath. (Patient not taking: Reported on 02/17/2017)   No facility-administered encounter medications on file as of 02/17/2017.     Activities of Daily Living In your present state of health, do you have any difficulty performing the following activities: 02/17/2017 02/17/2017  Hearing? N N  Vision? N -  Difficulty concentrating or making decisions? N -  Walking or climbing stairs? Y -  Dressing or bathing? N -  Doing errands, shopping? N -  Preparing Food and eating ? N -  Using the Toilet? N -  In the  past six months, have you accidently leaked urine? N -  Do you have problems with loss of bowel control? N -  Managing your Medications? N -  Managing your Finances? N -  Housekeeping or managing your Housekeeping? N -  Some recent data  might be hidden    Patient Care Team: Mosie Lukes, MD as PCP - General (Family Medicine)    Assessment:    Physical assessment deferred to PCP.  Exercise Activities and Dietary recommendations Current Exercise Habits: The patient does not participate in regular exercise at present Diet (meal preparation, eat out, water intake, caffeinated beverages, dairy products, fruits and vegetables): in general, a "healthy" diet         Goals    . Weight (lb) < 170 lb (77.1 kg)          With diet and exercise      Fall Risk Fall Risk  02/17/2017 10/09/2014 10/07/2013  Falls in the past year? No No Yes  Number falls in past yr: - - 2 or more   Depression Screen PHQ 2/9 Scores 02/17/2017 10/09/2014 10/07/2013  PHQ - 2 Score 0 0 0     Cognitive Function Ad8 score reviewed for issues:  Issues making decisions:no  Less interest in hobbies / activities:no  Repeats questions, stories (family complaining):no  Trouble using ordinary gadgets (microwave, computer, phone):no  Forgets the month or year: no  Mismanaging finances: no  Remembering appts:no  Daily problems with thinking and/or memory:no Ad8 score is=0     MMSE - Mini Mental State Exam 01/31/2017  Orientation to time 5  Orientation to Place 5  Registration 3  Attention/ Calculation 5  Recall 3  Language- name 2 objects 2  Language- repeat 1  Language- follow 3 step command 3  Language- read & follow direction 1  Write a sentence 1  Copy design 1  Total score 30        Immunization History  Administered Date(s) Administered  . Influenza Split 06/30/2011, 06/11/2012  . Influenza Whole 07/18/2007, 06/18/2008  . Influenza-Unspecified 06/25/2013, 07/05/2014, 08/05/2016  .  Pneumococcal Polysaccharide-23 06/25/2008, 10/07/2013  . Td 12/08/2008   Screening Tests Health Maintenance  Topic Date Due  . PNA vac Low Risk Adult (2 of 2 - PCV13) 10/07/2014  . INFLUENZA VACCINE  04/19/2017  . COLONOSCOPY  03/18/2018  . MAMMOGRAM  08/29/2018  . TETANUS/TDAP  12/09/2018  . DEXA SCAN  Completed  . Hepatitis C Screening  Completed      Plan:   Follow up with Dr. Charlett Blake today as scheduled.  Continue to eat heart healthy diet (full of fruits, vegetables, whole grains, lean protein, water--limit salt, fat, and sugar intake) and increase physical activity as tolerated.  Continue doing brain stimulating activities (puzzles, reading, adult coloring books, staying active) to keep memory sharp.   Bring a copy of your advance directives to your next office visit.   I have personally reviewed and noted the following in the patient's chart:   . Medical and social history . Use of alcohol, tobacco or illicit drugs  . Current medications and supplements . Functional ability and status . Nutritional status . Physical activity . Advanced directives . List of other physicians . Hospitalizations, surgeries, and ER visits in previous 12 months . Vitals . Screenings to include cognitive, depression, and falls . Referrals and appointments  In addition, I have reviewed and discussed with patient certain preventive protocols, quality metrics, and best practice recommendations. A written personalized care plan for preventive services as well as general preventive health recommendations were provided to patient.     Shela Nevin, [place]  02/17/2017

## 2017-02-17 ENCOUNTER — Ambulatory Visit (INDEPENDENT_AMBULATORY_CARE_PROVIDER_SITE_OTHER): Payer: Medicare Other | Admitting: Cardiology

## 2017-02-17 ENCOUNTER — Encounter: Payer: Self-pay | Admitting: Cardiology

## 2017-02-17 ENCOUNTER — Ambulatory Visit (INDEPENDENT_AMBULATORY_CARE_PROVIDER_SITE_OTHER): Payer: Medicare Other | Admitting: Family Medicine

## 2017-02-17 ENCOUNTER — Encounter: Payer: Self-pay | Admitting: Family Medicine

## 2017-02-17 ENCOUNTER — Telehealth: Payer: Self-pay | Admitting: Family Medicine

## 2017-02-17 VITALS — BP 130/84 | HR 90 | Ht 68.0 in | Wt 186.8 lb

## 2017-02-17 VITALS — BP 126/82 | HR 88 | Ht 68.0 in | Wt 187.0 lb

## 2017-02-17 DIAGNOSIS — I10 Essential (primary) hypertension: Secondary | ICD-10-CM | POA: Diagnosis not present

## 2017-02-17 DIAGNOSIS — Z0001 Encounter for general adult medical examination with abnormal findings: Secondary | ICD-10-CM

## 2017-02-17 DIAGNOSIS — R0602 Shortness of breath: Secondary | ICD-10-CM | POA: Diagnosis not present

## 2017-02-17 DIAGNOSIS — M8580 Other specified disorders of bone density and structure, unspecified site: Secondary | ICD-10-CM

## 2017-02-17 DIAGNOSIS — H269 Unspecified cataract: Secondary | ICD-10-CM

## 2017-02-17 DIAGNOSIS — R55 Syncope and collapse: Secondary | ICD-10-CM | POA: Diagnosis not present

## 2017-02-17 DIAGNOSIS — R9431 Abnormal electrocardiogram [ECG] [EKG]: Secondary | ICD-10-CM

## 2017-02-17 DIAGNOSIS — Z23 Encounter for immunization: Secondary | ICD-10-CM | POA: Diagnosis not present

## 2017-02-17 DIAGNOSIS — M797 Fibromyalgia: Secondary | ICD-10-CM | POA: Diagnosis not present

## 2017-02-17 DIAGNOSIS — E785 Hyperlipidemia, unspecified: Secondary | ICD-10-CM

## 2017-02-17 DIAGNOSIS — R911 Solitary pulmonary nodule: Secondary | ICD-10-CM | POA: Diagnosis not present

## 2017-02-17 DIAGNOSIS — R739 Hyperglycemia, unspecified: Secondary | ICD-10-CM | POA: Diagnosis not present

## 2017-02-17 DIAGNOSIS — Z0000 Encounter for general adult medical examination without abnormal findings: Secondary | ICD-10-CM

## 2017-02-17 LAB — COMPREHENSIVE METABOLIC PANEL
ALBUMIN: 4.4 g/dL (ref 3.5–5.2)
ALT: 11 U/L (ref 0–35)
AST: 19 U/L (ref 0–37)
Alkaline Phosphatase: 69 U/L (ref 39–117)
BUN: 19 mg/dL (ref 6–23)
CALCIUM: 9.7 mg/dL (ref 8.4–10.5)
CHLORIDE: 103 meq/L (ref 96–112)
CO2: 32 meq/L (ref 19–32)
CREATININE: 0.77 mg/dL (ref 0.40–1.20)
GFR: 78.5 mL/min (ref >60.00)
Glucose, Bld: 112 mg/dL — ABNORMAL HIGH (ref 70–99)
POTASSIUM: 3.3 meq/L — AB (ref 3.5–5.1)
Sodium: 142 mEq/L (ref 135–145)
Total Bilirubin: 0.4 mg/dL (ref 0.2–1.2)
Total Protein: 7.2 g/dL (ref 6.0–8.3)

## 2017-02-17 LAB — LIPID PANEL
CHOLESTEROL: 178 mg/dL (ref 0–200)
HDL: 76.5 mg/dL (ref >39.00)
LDL CALC: 82 mg/dL (ref 0–99)
NonHDL: 101.75
TRIGLYCERIDES: 99 mg/dL (ref 0.0–149.0)
Total CHOL/HDL Ratio: 2
VLDL: 19.8 mg/dL (ref 0.0–40.0)

## 2017-02-17 LAB — CBC
HEMATOCRIT: 39.3 % (ref 36.0–46.0)
Hemoglobin: 13 g/dL (ref 12.0–15.0)
MCHC: 33.2 g/dL (ref 30.0–36.0)
MCV: 88.6 fl (ref 78.0–100.0)
PLATELETS: 237 10*3/uL (ref 150.0–400.0)
RBC: 4.44 Mil/uL (ref 3.87–5.11)
RDW: 13.9 % (ref 11.5–15.5)
WBC: 7.6 10*3/uL (ref 4.0–10.5)

## 2017-02-17 LAB — TSH: TSH: 1.95 u[IU]/mL (ref 0.35–4.50)

## 2017-02-17 LAB — HEMOGLOBIN A1C: Hgb A1c MFr Bld: 5.9 % (ref 4.6–6.5)

## 2017-02-17 MED ORDER — ZOSTER VAC RECOMB ADJUVANTED 50 MCG/0.5ML IM SUSR: 0.5000 mL | Freq: Once | INTRAMUSCULAR | 1 refills | Status: AC

## 2017-02-17 NOTE — Patient Instructions (Signed)

## 2017-02-17 NOTE — Telephone Encounter (Addendum)
Relation to YW:XIPP Call back number:[phone]   Reason for call:  Patient requesting all labs results and imaging results from January 2018 including today labs , please mail to patient home.

## 2017-02-17 NOTE — Progress Notes (Signed)
Patient ID: KEARSTYN AVITIA, female   DOB: Nov 22, 1945, 71 y.o.   MRN: [id number]   Subjective:  I acted as a Education administrator for Penni Homans, [place], [place]   Patient ID: SHERREE SHANKMAN, female    DOB: [birth year], 71 y.o.   MRN: [id number]  Chief Complaint  Patient presents with  . Medicare Wellness    with RN    HPI  Patient is in today for a Annual Wellness Visit with the Health Coach to be followed up with the Provider. Patient has a Hx of GERD, HTN, osteopenia, hyperlipidemia. Patient has no additional concerns noted at this time. She continues to struggle with fibromyalgia but she feels she wants to start trying to wean off her Cymbalta soon since she is doing fairly well. No hospitalizations or febrile illness. Denies CP/palp/SOB/HA/congestion/fevers/GI or GU c/o. Taking meds as prescribed  Patient Care Team: Mosie Lukes, MD as PCP - General (Family Medicine)   Past Medical History:  Diagnosis Date  . Anxiety   . Arthritis 09-14-11   osteoarthritis, osteopenia  . Cataracts, both eyes 09-13-11   not surgical yet  . Cholelithiasis 05/17/2016  . Chronic neck pain 05/29/2016  . Cystitis 2007   sepsis post bladder biopsy, Dr Jeffie Pollock  . Fibromyalgia 09-14-11   Dr Donney Dice, [facility]  . Fractures 09-14-11   toes-left foot  . GERD (gastroesophageal reflux disease) 09-14-11   tx. Nexium  . H/O foot surgery 12/19/2011  . Hiatal hernia 05/17/2016  . Hyperglycemia 08/23/2016  . Hyperlipidemia   . Hypertension 09-13-11   tx. meds  . 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 SOB (shortness of breath) 02/17/2017  . TMJ click [id number]   right side > left  . 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 ABDOMINAL HYSTERECTOMY  1977   dysfunctional menses; age 50  . APPENDECTOMY  1963  . COLONOSCOPY  2011   negative  . CYSTOSTOMY W/ BLADDER BIOPSY  04-2006  . FOOT  ARTHRODESIS  01/03/12   [facility]-- left hallux MP joint arthrodesis  . HAMMER TOE SURGERY  01/03/12   [facility], Dr Doran Durand  . NASAL SINUS SURGERY  09-14-11   right side " sinus lift"  . SIGMOIDOSCOPY    . sling procedure  2006   a and p repair  . TONSILLECTOMY    . TOTAL KNEE ARTHROPLASTY  09/21/2011   Procedure: TOTAL KNEE ARTHROPLASTY;  Surgeon: Gearlean Alf;  Location: [facility];  Service: Orthopedics;  Laterality: Left;  . TOTAL KNEE ARTHROPLASTY  06/25/2012   Procedure: TOTAL KNEE ARTHROPLASTY;  Surgeon: Gearlean Alf, MD;  Location: [facility];  Service: Orthopedics;  Laterality: Right;    Family History  Problem Relation Age of Onset  . Aortic aneurysm Father        femoral aneurysm  . Mental illness Brother        paranoid schizophrenia  . Pneumonia Brother        pneumothorax in [place]  . Heart attack Unknown        both GF;PGM; M uncle  . Stroke Paternal Aunt        aneurysm  . Colon cancer Paternal Aunt        X 2  . Other Mother        lung tumor which resolved w/o treatment  . Hypertension  Mother   . Other Maternal Grandmother        enalraged heart  . Hypertension Maternal Grandmother   . Heart attack Maternal Grandfather        In fifties, died.  . Hypertension Maternal Grandfather   . Heart attack Paternal Grandmother        In forties.  . Hypertension Paternal Grandmother   . Heart attack Paternal Grandfather   . Heart disease Paternal Grandfather   . Hypertension Paternal Grandfather   . Hypertension Daughter   . Hypertension Son   . Aortic dissection Sister        descending  . Diabetes Neg Hx     Social History   Social History  . Marital status: Married    Spouse name: N/A  . Number of children: 2  . Years of education: N/A   Occupational History  . Not on file.   Social History Main Topics  . Smoking status: Never Smoker  . Smokeless tobacco: Never Used  . Alcohol use Yes     Comment: rarely  . Drug use: No  . Sexual  activity: Yes   Other Topics Concern  . Not on file   Social History Narrative   Reg exercise 2 x weekly with trainer, no dietary restrictions. Trying to eat heart healthy.       Retired from [employer].    Outpatient Medications Prior to Visit  Medication Sig Dispense Refill  . aspirin EC 81 MG tablet Take 81 mg by mouth daily.    Marland Kitchen CALCIUM PO Take 600 mg by mouth 2 (two) times daily.     . Cholecalciferol (VITAMIN D3) 2000 UNITS TABS Take 2,000 Units by mouth daily.     . cyclobenzaprine (FLEXERIL) 10 MG tablet Take 20 mg by mouth at bedtime.    Marland Kitchen diltiazem (DILACOR XR) 180 MG 24 hr capsule Take 1 capsule (180 mg total) by mouth at bedtime. 90 capsule 3  . DULoxetine (CYMBALTA) 60 MG capsule Take 60 mg by mouth daily.    . fosinopril (MONOPRIL) 10 MG tablet Take 1 tablet (10 mg total) by mouth at bedtime. 90 tablet 3  . meloxicam (MOBIC) 7.5 MG tablet Take 7.5 mg by mouth daily. Every morning    . Multiple Vitamin (MULTI-DAY PO) Take by mouth daily.    Marland Kitchen oxyCODONE (OXY IR/ROXICODONE) 5 MG immediate release tablet Take 1 tablet by mouth every 6 (six) hours as needed.    . pravastatin (PRAVACHOL) 40 MG tablet Take 1 tablet (40 mg total) by mouth at bedtime. 90 tablet 3  . ranitidine (ZANTAC) 150 MG capsule Take 150 mg by mouth 2 (two) times daily.     . tizanidine (ZANAFLEX) 2 MG capsule Take 4 mg by mouth at bedtime.    . traMADol (ULTRAM) 50 MG tablet Take 2 tablets by mouth every 6 (six) hours as needed. Not to exceed 6 tablets a day    . albuterol (PROVENTIL HFA;VENTOLIN HFA) 108 (90 Base) MCG/ACT inhaler Inhale 2 puffs into the lungs every 6 (six) hours as needed for wheezing or shortness of breath. 1 Inhaler 0   No facility-administered medications prior to visit.     Allergies  Allergen Reactions  . Ivp Dye [Iodinated Diagnostic Agents]     Rash , hives  . Codeine Itching    Unless something given to counteract itching    Review of Systems  Constitutional: Negative for  fever and malaise/fatigue.  HENT: Negative for congestion.  Eyes: Negative for blurred vision.  Respiratory: Negative for cough and shortness of breath.   Cardiovascular: Negative for chest pain, palpitations and leg swelling.  Gastrointestinal: Negative for vomiting.  Musculoskeletal: Negative for back pain.  Skin: Negative for rash.  Neurological: Negative for loss of consciousness and headaches.       Objective:    Physical Exam  Constitutional: She is oriented to person, place, and time. She appears well-developed and well-nourished. No distress.  HENT:  Head: Normocephalic and atraumatic.  Eyes: Conjunctivae are normal.  Neck: Normal range of motion. No thyromegaly present.  Cardiovascular: Normal rate and regular rhythm.   Pulmonary/Chest: Effort normal and breath sounds normal. She has no wheezes.  Abdominal: Soft. Bowel sounds are normal. There is no tenderness.  Musculoskeletal: She exhibits no edema or deformity.  Neurological: She is alert and oriented to person, place, and time.  Skin: Skin is warm and dry. She is not diaphoretic.  Psychiatric: She has a normal mood and affect.    BP 130/84 (BP Location: Right Arm, Patient Position: Sitting, Cuff Size: Normal)   Pulse 90   Ht 5\' 8"  (1.727 m)   Wt 186 lb 12.8 oz (84.7 kg)   SpO2 99%   BMI 28.40 kg/m  Wt Readings from Last 3 Encounters:  02/17/17 187 lb (84.8 kg)  02/17/17 186 lb 12.8 oz (84.7 kg)  01/27/17 182 lb (82.6 kg)   BP Readings from Last 3 Encounters:  02/17/17 126/82  02/17/17 130/84  01/27/17 108/74     Immunization History  Administered Date(s) Administered  . Influenza Split 06/30/2011, 06/11/2012  . Influenza Whole 07/18/2007, 06/18/2008  . Influenza-Unspecified 06/25/2013, 07/05/2014, 08/05/2016  . Pneumococcal Polysaccharide-23 06/25/2008, 10/07/2013  . Td 12/08/2008    Health Maintenance  Topic Date Due  . PNA vac Low Risk Adult (2 of 2 - PCV13) 10/07/2014  . INFLUENZA VACCINE   04/19/2017  . COLONOSCOPY  03/18/2018  . MAMMOGRAM  08/29/2018  . TETANUS/TDAP  12/09/2018  . DEXA SCAN  Completed  . Hepatitis C Screening  Completed    Lab Results  Component Value Date   WBC 7.6 02/17/2017   HGB 13.0 02/17/2017   HCT 39.3 02/17/2017   PLT 237.0 02/17/2017   GLUCOSE 112 (H) 02/17/2017   CHOL 178 02/17/2017   TRIG 99.0 02/17/2017   HDL 76.50 02/17/2017   LDLCALC 82 02/17/2017   ALT 11 02/17/2017   AST 19 02/17/2017   NA 142 02/17/2017   K 3.3 (L) 02/17/2017   CL 103 02/17/2017   CREATININE 0.77 02/17/2017   BUN 19 02/17/2017   CO2 32 02/17/2017   TSH 1.95 02/17/2017   INR 1.02 06/15/2012   HGBA1C 5.9 02/17/2017    Lab Results  Component Value Date   TSH 1.95 02/17/2017   Lab Results  Component Value Date   WBC 7.6 02/17/2017   HGB 13.0 02/17/2017   HCT 39.3 02/17/2017   MCV 88.6 02/17/2017   PLT 237.0 02/17/2017   Lab Results  Component Value Date   NA 142 02/17/2017   K 3.3 (L) 02/17/2017   CO2 32 02/17/2017   GLUCOSE 112 (H) 02/17/2017   BUN 19 02/17/2017   CREATININE 0.77 02/17/2017   BILITOT 0.4 02/17/2017   ALKPHOS 69 02/17/2017   AST 19 02/17/2017   ALT 11 02/17/2017   PROT 7.2 02/17/2017   ALBUMIN 4.4 02/17/2017   CALCIUM 9.7 02/17/2017   ANIONGAP 9 07/18/2016   GFR 78.50 02/17/2017   Lab  Results  Component Value Date   CHOL 178 02/17/2017   Lab Results  Component Value Date   HDL 76.50 02/17/2017   Lab Results  Component Value Date   LDLCALC 82 02/17/2017   Lab Results  Component Value Date   TRIG 99.0 02/17/2017   Lab Results  Component Value Date   CHOLHDL 2 02/17/2017   Lab Results  Component Value Date   HGBA1C 5.9 02/17/2017         Assessment & Plan:   Problem List Items Addressed This Visit    Hyperlipidemia    Tolerating statin, encouraged heart healthy diet, avoid trans fats, minimize simple carbs and saturated fats. Increase exercise as tolerated      Relevant Orders   Lipid panel  (Completed)   Essential hypertension    Well controlled, no changes to meds. Encouraged heart healthy diet such as the DASH diet and exercise as tolerated.       Fibromyalgia    Unfortunately has been taking Tizanidine 4 mg and Cyclobenzaprine 20 mg both qhs, she is aware she cannot take both at once she is going to try 2 weeks on each and see which she likes best. Is doing well and is considering stopping Cymbalta but is going to wait til she gets back from vacation.  Then she weill start to titrate down doses.       Osteopenia    Encouraged to get adequate exercise, calcium and vitamin d intake      Hyperglycemia    minimize simple carbs. Increase exercise as tolerated.       Relevant Orders   Hemoglobin A1c (Completed)   Syncope    Neurology cleared for driving once cleared by cardiology, has had no more syncopal episodes.       Pulmonary nodule    4 mm Pulm nodule on CT scan. asymptomatic      SOB (shortness of breath)    Other Visit Diagnoses    Encounter for Medicare annual wellness exam    -  Primary   Cataract of both eyes, unspecified cataract type       Relevant Orders   Ambulatory referral to Ophthalmology   Need for vaccination with 13-polyvalent pneumococcal conjugate vaccine       Need for zoster vaccination       Hypertension, unspecified type       Relevant Orders   CBC (Completed)   Comprehensive metabolic panel (Completed)   TSH (Completed)      I am having Ms. Waterfield start on Zoster Vac Recomb Adjuvanted. I am also having her maintain her Vitamin D3, Multiple Vitamin (MULTI-DAY PO), CALCIUM PO, meloxicam, tizanidine, cyclobenzaprine, aspirin EC, ranitidine, oxyCODONE, traMADol, diltiazem, pravastatin, DULoxetine, fosinopril, and albuterol.  Meds ordered this encounter  Medications  . Zoster Vac Recomb Adjuvanted [facility]) injection    Sig: Inject 0.5 mLs into the muscle once.    Dispense:  0.5 mL    Refill:  1    CMA served as scribe during  this visit. History, Physical and Plan performed by medical provider. Documentation and orders reviewed and attested to.  Penni Homans, MD

## 2017-02-17 NOTE — Assessment & Plan Note (Signed)
4 mm Pulm nodule on CT scan. asymptomatic

## 2017-02-17 NOTE — Telephone Encounter (Signed)
Labs ordered today may take a while to fully result. As soon as the results are in and the provider has seen them then we can print them and mail them or she cna look them up on Mychart.     pc

## 2017-02-17 NOTE — Assessment & Plan Note (Addendum)
Unfortunately has been taking Tizanidine 4 mg and Cyclobenzaprine 20 mg both qhs, she is aware she cannot take both at once she is going to try 2 weeks on each and see which she likes best. Is doing well and is considering stopping Cymbalta but is going to wait til she gets back from vacation.  Then she weill start to titrate down doses.

## 2017-02-17 NOTE — Assessment & Plan Note (Signed)
Encouraged to get adequate exercise, calcium and vitamin d intake 

## 2017-02-17 NOTE — Assessment & Plan Note (Signed)
Neurology cleared for driving once cleared by cardiology, has had no more syncopal episodes.

## 2017-02-17 NOTE — Assessment & Plan Note (Signed)
minimize simple carbs. Increase exercise as tolerated.  

## 2017-02-17 NOTE — Patient Instructions (Addendum)
  Ms. Hargreaves , Thank you for taking time to come for your Medicare Wellness Visit. I appreciate your ongoing commitment to your health goals. Please review the following plan we discussed and let me know if I can assist you in the future.   These are the goals we discussed: Goals    . Weight (lb) < 170 lb (77.1 kg)          With diet and exercise       This is a list of the screening recommended for you and due dates:  Health Maintenance  Topic Date Due  . Pneumonia vaccines (2 of 2 - PCV13) 10/07/2014  . Flu Shot  04/19/2017  . Colon Cancer Screening  03/18/2018  . Mammogram  08/29/2018  . Tetanus Vaccine  12/09/2018  . DEXA scan (bone density measurement)  Completed  .  Hepatitis C: One time screening is recommended by Center for Disease Control  (CDC) for  adults born from [birth year] through 1965.   Completed   Continue to eat heart healthy diet (full of fruits, vegetables, whole grains, lean protein, water--limit salt, fat, and sugar intake) and increase physical activity as tolerated.  Continue doing brain stimulating activities (puzzles, reading, adult coloring books, staying active) to keep memory sharp.   Bring a copy of your advance directives to your next office visit.

## 2017-02-18 ENCOUNTER — Encounter: Payer: Self-pay | Admitting: Cardiology

## 2017-02-18 DIAGNOSIS — R9431 Abnormal electrocardiogram [ECG] [EKG]: Secondary | ICD-10-CM | POA: Insufficient documentation

## 2017-02-19 NOTE — Assessment & Plan Note (Signed)
Well controlled, no changes to meds. Encouraged heart healthy diet such as the DASH diet and exercise as tolerated.  °

## 2017-02-19 NOTE — Assessment & Plan Note (Signed)
Tolerating statin, encouraged heart healthy diet, avoid trans fats, minimize simple carbs and saturated fats. Increase exercise as tolerated 

## 2017-02-20 NOTE — Telephone Encounter (Signed)
Spoke with patient advised her to call medical records for some of the imaging. I did send her lab results to her and also sent her a potassium form to help increase her potassium. She agreed and voiced her understanding.   PC

## 2017-03-03 ENCOUNTER — Ambulatory Visit (HOSPITAL_COMMUNITY): Payer: Medicare Other | Attending: Internal Medicine

## 2017-03-03 ENCOUNTER — Other Ambulatory Visit: Payer: Self-pay

## 2017-03-03 DIAGNOSIS — E785 Hyperlipidemia, unspecified: Secondary | ICD-10-CM | POA: Diagnosis not present

## 2017-03-03 DIAGNOSIS — F419 Anxiety disorder, unspecified: Secondary | ICD-10-CM | POA: Insufficient documentation

## 2017-03-03 DIAGNOSIS — R0602 Shortness of breath: Secondary | ICD-10-CM | POA: Insufficient documentation

## 2017-03-03 DIAGNOSIS — I10 Essential (primary) hypertension: Secondary | ICD-10-CM | POA: Diagnosis not present

## 2017-03-03 DIAGNOSIS — M797 Fibromyalgia: Secondary | ICD-10-CM | POA: Diagnosis not present

## 2017-03-03 DIAGNOSIS — I083 Combined rheumatic disorders of mitral, aortic and tricuspid valves: Secondary | ICD-10-CM | POA: Diagnosis not present

## 2017-03-03 DIAGNOSIS — R9431 Abnormal electrocardiogram [ECG] [EKG]: Secondary | ICD-10-CM | POA: Insufficient documentation

## 2017-03-07 ENCOUNTER — Telehealth: Payer: Self-pay | Admitting: Cardiology

## 2017-03-07 DIAGNOSIS — R55 Syncope and collapse: Secondary | ICD-10-CM

## 2017-03-07 NOTE — Telephone Encounter (Signed)
Spoke with patient and she is scheduled to go to the beach 03/12/13-03/27/17 and a cruise 8/3-8/11. She does not think wearing the monitor on the beach will be doable but if urgent she will try. She would like for Dr Percival Spanish to advise on the urgency of monitor and the cruise, she will do whatever he feels is best.  Will forward to Dr Percival Spanish for review.

## 2017-03-07 NOTE — Telephone Encounter (Signed)
New Message  Pt call requesting to speak with RN. Pt states she would like to know the results of her test. Pt also states that she is going to the beach and would like to know if it would be okay to do so wearing the event monitor. Pt is going on a cruise and would like to know if she needs to go with the diagnosis of [place]. please call back to discuss

## 2017-03-09 NOTE — Telephone Encounter (Signed)
OK to hold off on event monitor until she gets back from the beach.

## 2017-03-10 NOTE — Telephone Encounter (Signed)
Ordered placed for 21 day monitor

## 2017-03-10 NOTE — Telephone Encounter (Signed)
appt made for monitor July 10th @2 :30 pm

## 2017-03-10 NOTE — Telephone Encounter (Signed)
-----   Message from Corinna Lines sent at 03/10/2017  9:33 AM EDT ----- Regarding: order   Nya  Can you put the order in the system please.  Thanks [facility]

## 2017-03-28 ENCOUNTER — Encounter (INDEPENDENT_AMBULATORY_CARE_PROVIDER_SITE_OTHER): Payer: Medicare Other

## 2017-03-28 ENCOUNTER — Ambulatory Visit: Payer: Medicare Other | Admitting: Neurology

## 2017-03-28 DIAGNOSIS — R55 Syncope and collapse: Secondary | ICD-10-CM

## 2017-04-21 ENCOUNTER — Telehealth: Payer: Self-pay | Admitting: Cardiology

## 2017-04-21 NOTE — Telephone Encounter (Signed)
New message   Pt states when she last was told she was going to be wearing a heart monitor she told Dr. Percival Spanish that she was scheduled for a cruise. She said the nurse told her to mail it August 8th. She states she left today, and she had to remove the monitor at 6:15pm 04/20/17. She just wants to make someone aware.

## 2017-04-21 NOTE — Telephone Encounter (Signed)
Routed to K. Bowman/S. Wells to notify of early monitor termination

## 2017-05-09 ENCOUNTER — Telehealth: Payer: Self-pay | Admitting: Cardiology

## 2017-05-09 NOTE — Telephone Encounter (Signed)
Returned call to patient.She stated she would like appointment with Dr.Hochrein to discuss results of echo and monitor.Advised his schedule is full.I will check with his scheduler and call back with appointment.

## 2017-05-09 NOTE — Telephone Encounter (Signed)
New message     Pt is calling to check on her event monitor results

## 2017-05-10 NOTE — Telephone Encounter (Signed)
Spoke to patient appointment scheduled with Dr.Hochrein 06/09/17 at 7:40 am to discuss results of echo and 3 week monitor.

## 2017-05-15 ENCOUNTER — Ambulatory Visit (INDEPENDENT_AMBULATORY_CARE_PROVIDER_SITE_OTHER): Payer: Medicare Other | Admitting: Family Medicine

## 2017-05-15 ENCOUNTER — Ambulatory Visit: Payer: Medicare Other | Admitting: Cardiology

## 2017-05-15 ENCOUNTER — Encounter: Payer: Self-pay | Admitting: Family Medicine

## 2017-05-15 DIAGNOSIS — E782 Mixed hyperlipidemia: Secondary | ICD-10-CM

## 2017-05-15 DIAGNOSIS — R739 Hyperglycemia, unspecified: Secondary | ICD-10-CM | POA: Diagnosis not present

## 2017-05-15 DIAGNOSIS — M8580 Other specified disorders of bone density and structure, unspecified site: Secondary | ICD-10-CM

## 2017-05-15 DIAGNOSIS — I517 Cardiomegaly: Secondary | ICD-10-CM | POA: Diagnosis not present

## 2017-05-15 DIAGNOSIS — I10 Essential (primary) hypertension: Secondary | ICD-10-CM | POA: Diagnosis not present

## 2017-05-15 HISTORY — DX: Cardiomegaly: I51.7

## 2017-05-15 LAB — COMPREHENSIVE METABOLIC PANEL
ALBUMIN: 4.5 g/dL (ref 3.5–5.2)
ALK PHOS: 69 U/L (ref 39–117)
ALT: 11 U/L (ref 0–35)
AST: 19 U/L (ref 0–37)
BILIRUBIN TOTAL: 0.5 mg/dL (ref 0.2–1.2)
BUN: 16 mg/dL (ref 6–23)
CALCIUM: 9.7 mg/dL (ref 8.4–10.5)
CO2: 33 meq/L — AB (ref 19–32)
Chloride: 103 mEq/L (ref 96–112)
Creatinine, Ser: 0.75 mg/dL (ref 0.40–1.20)
GFR: 80.87 mL/min (ref >60.00)
Glucose, Bld: 105 mg/dL — ABNORMAL HIGH (ref 70–99)
Potassium: 4.2 mEq/L (ref 3.5–5.1)
Sodium: 139 mEq/L (ref 135–145)
TOTAL PROTEIN: 7.3 g/dL (ref 6.0–8.3)

## 2017-05-15 MED ORDER — FOSINOPRIL SODIUM 10 MG PO TABS: 10.0000 mg | ORAL_TABLET | Freq: Every day | ORAL | 1 refills | Status: DC

## 2017-05-15 MED ORDER — DILTIAZEM HCL ER 180 MG PO CP24: 180.0000 mg | ORAL_CAPSULE | Freq: Every day | ORAL | 1 refills | Status: DC

## 2017-05-15 MED ORDER — PRAVASTATIN SODIUM 40 MG PO TABS: 40.0000 mg | ORAL_TABLET | Freq: Every day | ORAL | 1 refills | Status: DC

## 2017-05-15 NOTE — Assessment & Plan Note (Signed)
Encouraged to get adequate exercise, calcium and vitamin d intake 

## 2017-05-15 NOTE — Assessment & Plan Note (Signed)
minimize simple carbs. Increase exercise as tolerated.  

## 2017-05-15 NOTE — Patient Instructions (Signed)
Shortness of Breath, Adult Shortness of breath is when a person has trouble breathing enough air, or when a person feels like she or he is having trouble breathing in enough air. Shortness of breath could be a sign of medical problem. Follow these instructions at home: Pay attention to any changes in your symptoms. Take these actions to help with your condition:  Do not smoke. Smoking is a common cause of shortness of breath. If you smoke and you need help quitting, ask your health care provider.  Avoid things that can irritate your airways, such as: ? Mold. ? Dust. ? Air pollution. ? Chemical fumes. ? Things that can cause allergy symptoms (allergens), if you have allergies.  Keep your living space clean and free of mold and dust.  Rest as needed. Slowly return to your usual activities.  Take over-the-counter and prescription medicines, including oxygen and inhaled medicines, only as told by your health care provider.  Keep all follow-up visits as told by your health care provider. This is important.  Contact a health care provider if:  Your condition does not improve as soon as expected.  You have a hard time doing your normal activities, even after you rest.  You have new symptoms. Get help right away if:  Your shortness of breath gets worse.  You have shortness of breath when you are resting.  You feel light-headed or you faint.  You have a cough that is not controlled with medicines.  You cough up blood.  You have pain with breathing.  You have pain in your chest, arms, shoulders, or abdomen.  You have a fever.  You cannot walk up stairs or exercise the way that you normally do. This information is not intended to replace advice given to you by your health care provider. Make sure you discuss any questions you have with your health care provider. Document Released: 05/31/2001 Document Revised: 03/26/2016 Document Reviewed: 02/11/2016 Elsevier Interactive Patient  Education  2018 [employer]  

## 2017-05-15 NOTE — Assessment & Plan Note (Signed)
Severe on recent echo has follow up. Encouraged to minimize sodium, keep weight down, keep stress in control consider yoga, taichi, medication

## 2017-05-15 NOTE — Progress Notes (Signed)
Patient ID: Madeline [name], female   DOB: April 03, 1946, 71 y.o.   MRN: [id number]   Subjective:    Patient ID: Madeline [name], female    DOB: Sep 07, 1946, 71 y.o.   MRN: [id number]  Chief Complaint  Patient presents with  . Follow-up    3 month    HPI Patient is in today for Follow-up. She feels well today although she does endorse persistent shortness of breath with exertion. Recent echocardiogram did show severe left ventricular hypertrophy and she has a follow-up appointment scheduled with cardiology in September. No recent febrile illness or acute hospitalizations. Denies CP/palp/HA/congestion/fevers/GI or GU c/o. Taking meds as prescribed  Past Medical History:  Diagnosis Date  . Anxiety   . Arthritis 09-14-11   osteoarthritis, osteopenia  . Cataracts, both eyes 09-13-11   not surgical yet  . Cholelithiasis 05/17/2016  . Chronic neck pain 05/29/2016  . Cystitis 2007   sepsis post bladder biopsy, Dr Jeffie Pollock  . Fibromyalgia 09-14-11   Dr Donney Dice, [facility]  . Fractures 09-14-11   toes-left foot  . GERD (gastroesophageal reflux disease) 09-14-11   tx. Nexium  . H/O foot surgery 12/19/2011  . Hiatal hernia 05/17/2016  . Hyperglycemia 08/23/2016  . Hyperlipidemia   . Hypertension 09-13-11   tx. meds  . 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 Left ventricular hypertrophy 05/15/2017  . SOB (shortness of breath) 02/17/2017  . TMJ click 76-28-31   right side > left  . 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 ABDOMINAL HYSTERECTOMY  1977   dysfunctional menses; age 59  . APPENDECTOMY  1963  . COLONOSCOPY  2011   negative  . CYSTOSTOMY W/ BLADDER BIOPSY  04-2006  . FOOT ARTHRODESIS  01/03/12   [facility]-- left hallux MP joint arthrodesis  . HAMMER TOE SURGERY  01/03/12   [facility], Dr Doran Durand  . NASAL SINUS SURGERY  09-14-11   right side " sinus lift"  .  SIGMOIDOSCOPY    . sling procedure  2006   a and p repair  . TONSILLECTOMY    . TOTAL KNEE ARTHROPLASTY  09/21/2011   Procedure: TOTAL KNEE ARTHROPLASTY;  Surgeon: Gearlean Alf;  Location: [facility];  Service: Orthopedics;  Laterality: Left;  . TOTAL KNEE ARTHROPLASTY  06/25/2012   Procedure: TOTAL KNEE ARTHROPLASTY;  Surgeon: Gearlean Alf, MD;  Location: [facility];  Service: Orthopedics;  Laterality: Right;    Family History  Problem Relation Age of Onset  . Aortic aneurysm Father        femoral aneurysm  . Mental illness Brother        paranoid schizophrenia  . Pneumonia Brother        pneumothorax in [facility]  . Heart attack Unknown        both GF;PGM; M uncle  . Stroke Paternal Aunt        aneurysm  . Colon cancer Paternal Aunt        X 2  . Other Mother        lung tumor which resolved w/o treatment  . Hypertension Mother   . Other Maternal Grandmother        enalraged heart  . Hypertension Maternal Grandmother   . Heart attack Maternal Grandfather        In fifties, died.  . Hypertension Maternal Grandfather   .  Heart attack Paternal Grandmother        In forties.  . Hypertension Paternal Grandmother   . Heart attack Paternal Grandfather   . Heart disease Paternal Grandfather   . Hypertension Paternal Grandfather   . Hypertension Daughter   . Hypertension Son   . Aortic dissection Sister        descending  . Diabetes Neg Hx     Social History   Social History  . Marital status: Married    Spouse name: N/A  . Number of children: 2  . Years of education: N/A   Occupational History  . Not on file.   Social History Main Topics  . Smoking status: Never Smoker  . Smokeless tobacco: Never Used  . Alcohol use Yes     Comment: rarely  . Drug use: No  . Sexual activity: Yes   Other Topics Concern  . Not on file   Social History Narrative   Reg exercise 2 x weekly with trainer, no dietary restrictions. Trying to eat heart healthy.       Retired from  [employer].    Outpatient Medications Prior to Visit  Medication Sig Dispense Refill  . aspirin EC 81 MG tablet Take 81 mg by mouth daily.    Marland Kitchen CALCIUM PO Take 600 mg by mouth 2 (two) times daily.     . Cholecalciferol (VITAMIN D3) 2000 UNITS TABS Take 2,000 Units by mouth daily.     . cyclobenzaprine (FLEXERIL) 10 MG tablet Take 20 mg by mouth at bedtime.    . DULoxetine (CYMBALTA) 60 MG capsule Take 60 mg by mouth daily.    . Multiple Vitamin (MULTI-DAY PO) Take by mouth daily.    . ranitidine (ZANTAC) 150 MG capsule Take 150 mg by mouth 2 (two) times daily.     . tizanidine (ZANAFLEX) 2 MG capsule Take 4 mg by mouth at bedtime.    . traMADol (ULTRAM) 50 MG tablet Take 2 tablets by mouth every 6 (six) hours as needed. Not to exceed 6 tablets a day    . diltiazem (DILACOR XR) 180 MG 24 hr capsule Take 1 capsule (180 mg total) by mouth at bedtime. 90 capsule 3  . fosinopril (MONOPRIL) 10 MG tablet Take 1 tablet (10 mg total) by mouth at bedtime. 90 tablet 3  . pravastatin (PRAVACHOL) 40 MG tablet Take 1 tablet (40 mg total) by mouth at bedtime. 90 tablet 3  . albuterol (PROVENTIL HFA;VENTOLIN HFA) 108 (90 Base) MCG/ACT inhaler Inhale 2 puffs into the lungs every 6 (six) hours as needed for wheezing or shortness of breath. (Patient not taking: Reported on 05/15/2017) 1 Inhaler 0  . meloxicam (MOBIC) 7.5 MG tablet Take 7.5 mg by mouth daily. Every morning    . oxyCODONE (OXY IR/ROXICODONE) 5 MG immediate release tablet Take 1 tablet by mouth every 6 (six) hours as needed.     No facility-administered medications prior to visit.     Allergies  Allergen Reactions  . Ivp Dye [Iodinated Diagnostic Agents]     Rash , hives  . Codeine Itching    Unless something given to counteract itching    Review of Systems  Constitutional: Negative for fever and malaise/fatigue.  HENT: Negative for congestion.   Eyes: Negative for blurred vision.  Respiratory: Positive for shortness of breath. Negative  for cough and wheezing.   Cardiovascular: Negative for chest pain, palpitations and leg swelling.  Gastrointestinal: Negative for abdominal pain, blood in stool and nausea.  Genitourinary: Negative for dysuria and frequency.  Musculoskeletal: Negative for falls.  Skin: Negative for rash.  Neurological: Negative for dizziness, loss of consciousness and headaches.  Endo/Heme/Allergies: Negative for environmental allergies.  Psychiatric/Behavioral: Negative for depression. The patient is not nervous/anxious.        Objective:    Physical Exam  Constitutional: She is oriented to person, place, and time. She appears well-developed and well-nourished. No distress.  HENT:  Head: Normocephalic and atraumatic.  Nose: Nose normal.  Eyes: Right eye exhibits no discharge. Left eye exhibits no discharge.  Neck: Normal range of motion. Neck supple.  Cardiovascular: Normal rate and regular rhythm.   No murmur heard. Pulmonary/Chest: Effort normal and breath sounds normal.  Abdominal: Soft. Bowel sounds are normal. There is no tenderness.  Musculoskeletal: She exhibits no edema.  Neurological: She is alert and oriented to person, place, and time.  Skin: Skin is warm and dry.  Psychiatric: She has a normal mood and affect.  Nursing note and vitals reviewed.   BP 124/70 (BP Location: Left Arm, Patient Position: Sitting, Cuff Size: Small)   Pulse 71   Temp 97.6 F (36.4 C) (Oral)   Resp 14   Ht 5\' 8"  (1.727 m)   Wt 186 lb (84.4 kg)   SpO2 98%   BMI 28.28 kg/m  Wt Readings from Last 3 Encounters:  05/15/17 186 lb (84.4 kg)  02/17/17 187 lb (84.8 kg)  02/17/17 186 lb 12.8 oz (84.7 kg)     Lab Results  Component Value Date   WBC 7.6 02/17/2017   HGB 13.0 02/17/2017   HCT 39.3 02/17/2017   PLT 237.0 02/17/2017   GLUCOSE 112 (H) 02/17/2017   CHOL 178 02/17/2017   TRIG 99.0 02/17/2017   HDL 76.50 02/17/2017   LDLCALC 82 02/17/2017   ALT 11 02/17/2017   AST 19 02/17/2017   NA 142  02/17/2017   K 3.3 (L) 02/17/2017   CL 103 02/17/2017   CREATININE 0.77 02/17/2017   BUN 19 02/17/2017   CO2 32 02/17/2017   TSH 1.95 02/17/2017   INR 1.02 06/15/2012   HGBA1C 5.9 02/17/2017    Lab Results  Component Value Date   TSH 1.95 02/17/2017   Lab Results  Component Value Date   WBC 7.6 02/17/2017   HGB 13.0 02/17/2017   HCT 39.3 02/17/2017   MCV 88.6 02/17/2017   PLT 237.0 02/17/2017   Lab Results  Component Value Date   NA 142 02/17/2017   K 3.3 (L) 02/17/2017   CO2 32 02/17/2017   GLUCOSE 112 (H) 02/17/2017   BUN 19 02/17/2017   CREATININE 0.77 02/17/2017   BILITOT 0.4 02/17/2017   ALKPHOS 69 02/17/2017   AST 19 02/17/2017   ALT 11 02/17/2017   PROT 7.2 02/17/2017   ALBUMIN 4.4 02/17/2017   CALCIUM 9.7 02/17/2017   ANIONGAP 9 07/18/2016   GFR 78.50 02/17/2017   Lab Results  Component Value Date   CHOL 178 02/17/2017   Lab Results  Component Value Date   HDL 76.50 02/17/2017   Lab Results  Component Value Date   LDLCALC 82 02/17/2017   Lab Results  Component Value Date   TRIG 99.0 02/17/2017   Lab Results  Component Value Date   CHOLHDL 2 02/17/2017   Lab Results  Component Value Date   HGBA1C 5.9 02/17/2017       Assessment & Plan:   Problem List Items Addressed This Visit    Hyperlipidemia  Encouraged heart healthy diet, increase exercise, avoid trans fats, consider a krill oil cap daily      Relevant Medications   fosinopril (MONOPRIL) 10 MG tablet   diltiazem (DILACOR XR) 180 MG 24 hr capsule   pravastatin (PRAVACHOL) 40 MG tablet   Essential hypertension    Well controlled, no changes to meds. Encouraged heart healthy diet such as the DASH diet and exercise as tolerated.       Relevant Medications   fosinopril (MONOPRIL) 10 MG tablet   diltiazem (DILACOR XR) 180 MG 24 hr capsule   pravastatin (PRAVACHOL) 40 MG tablet   Other Relevant Orders   Comprehensive metabolic panel   Osteopenia    Encouraged to get  adequate exercise, calcium and vitamin d intake      Hyperglycemia    minimize simple carbs. Increase exercise as tolerated.       Left ventricular hypertrophy    Severe on recent echo has follow up. Encouraged to minimize sodium, keep weight down, keep stress in control consider yoga, taichi, medication      Relevant Medications   fosinopril (MONOPRIL) 10 MG tablet   diltiazem (DILACOR XR) 180 MG 24 hr capsule   pravastatin (PRAVACHOL) 40 MG tablet      I am having Ms. Kingry maintain her Vitamin D3, Multiple Vitamin (MULTI-DAY PO), CALCIUM PO, meloxicam, tizanidine, cyclobenzaprine, aspirin EC, ranitidine, oxyCODONE, traMADol, DULoxetine, albuterol, fosinopril, diltiazem, and pravastatin.  Meds ordered this encounter  Medications  . fosinopril (MONOPRIL) 10 MG tablet    Sig: Take 1 tablet (10 mg total) by mouth at bedtime.    Dispense:  90 tablet    Refill:  1  . diltiazem (DILACOR XR) 180 MG 24 hr capsule    Sig: Take 1 capsule (180 mg total) by mouth at bedtime.    Dispense:  90 capsule    Refill:  1  . pravastatin (PRAVACHOL) 40 MG tablet    Sig: Take 1 tablet (40 mg total) by mouth at bedtime.    Dispense:  90 tablet    Refill:  1     Penni Homans, MD

## 2017-05-15 NOTE — Assessment & Plan Note (Signed)
Well controlled, no changes to meds. Encouraged heart healthy diet such as the DASH diet and exercise as tolerated.  °

## 2017-05-15 NOTE — Assessment & Plan Note (Signed)
Encouraged heart healthy diet, increase exercise, avoid trans fats, consider a krill oil cap daily 

## 2017-05-15 NOTE — Progress Notes (Signed)
Pre visit review using our clinic review tool, if applicable. No additional management support is needed unless otherwise documented below in the visit note. 

## 2017-06-07 ENCOUNTER — Ambulatory Visit (INDEPENDENT_AMBULATORY_CARE_PROVIDER_SITE_OTHER): Payer: Medicare Other | Admitting: Neurology

## 2017-06-07 ENCOUNTER — Encounter: Payer: Self-pay | Admitting: Neurology

## 2017-06-07 VITALS — BP 108/62 | HR 107 | Ht 68.0 in | Wt 187.0 lb

## 2017-06-07 DIAGNOSIS — R55 Syncope and collapse: Secondary | ICD-10-CM

## 2017-06-07 NOTE — Progress Notes (Signed)
NEUROLOGY FOLLOW UP OFFICE NOTE  SHAM ALVIAR [id number] [birth year]  HISTORY OF PRESENT ILLNESS: I had the pleasure of seeing Arelia Volpe in follow-up in the neurology clinic on 06/07/2017.  The patient was last seen 4 months ago after an episode of loss of consciousness last April 2018. Records and images were personally reviewed where available.  I personally reviewed MRI brain without contrast done 02/05/17 which did not show any acute changes, there was mild diffuse atrophy and moderate chronic microvascular disease. Her 1-hour EEG was normal. She has had an echo showing severe proximal septal hypertrophy, severe LVH, EF 65-70%. She had a 21-day holter with no arrhythmia seen. She denies any further syncopal spells since April and has been doing well the past 4 months. She occasionally feels lightheaded and reports her BP in the office today is low for her (108/62). She denies any headaches, vision changes, focal numbness/tingling/weakness, no further falls.  HPI 01/27/2017: This is a pleasant 71 yo RH woman with a history of hypertension, hyperlipidemia, fibromyalgia, who presented for evaluation of an episode of loss of consciousness last April 11 or 12. She was in [place] and woke up in the middle of the night, recalls getting up and walking around, then woke up on the floor. She did not have any warning symptoms, no dizziness or palpitations, when she came to it was still dark outside but she did not know how long she was out. There was a big bruise on her right hip, no tongue bite, urinary incontinence, or focal weakness. She reports a history of syncope with low BP when she was pregnant, but would always have a warning of seeing shades coming down, hearing distortion where she could hear but could not distinguish words. The last time she felt this way was years ago while sitting under the sun for a graduation but did not fully lose consciousness. She recalls being sick with diarrhea one time  and fainting with no warning sympotms.   She denies any staring/unresponsive episodes, gaps in time, olfactory/gustatory hallucinations, deja vu, rising epigastric sensation, myoclonic jerks. She has arthritis in her neck with numbness and tingling in the left ulnar distribution in her hand. She used to have migraines but denies any recent headaches. She gets lightheaded and short of breath going up stairs and has balance issues. She reports seeing neurologist Dr. Erling Cruz in the 1990s for problems with her memory. She could not remember fun times her family would reminisce about. She had an MRI and was told "there is something there but did not want to call it a stroke." Over the past year she stopped Nexium due to concern for side effect of memory changes, but feels Zantac does not help as much with GERD. She also stopped the Lyrica for fibromyalgia which her son begged her to stop in the summer of 2017. Sometimes she feels she is doing better cognitively, but the Lyrica was helping with her pain. There is no family history of similar symptoms. She has 3 first cousins with cerebral aneurysm. Otherwise she had a normal birth and early development.  There is no history of febrile convulsions, CNS infections such as meningitis/encephalitis, significant traumatic brain injury, neurosurgical procedures, or family history of seizures.  PAST MEDICAL HISTORY: Past Medical History:  Diagnosis Date  . Anxiety   . Arthritis 09-14-11   osteoarthritis, osteopenia  . Cataracts, both eyes 09-13-11   not surgical yet  . Cholelithiasis 05/17/2016  . Chronic neck pain 05/29/2016  .  Cystitis 2007   sepsis post bladder biopsy, Dr Jeffie Pollock  . Fibromyalgia 09-14-11   Dr Donney Dice, [facility]  . Fractures 09-14-11   toes-left foot  . GERD (gastroesophageal reflux disease) 09-14-11   tx. Nexium  . H/O foot surgery 12/19/2011  . Hiatal hernia 05/17/2016  . Hyperglycemia 08/23/2016  . Hyperlipidemia   . Hypertension  09-13-11   tx. meds  . 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 Left ventricular hypertrophy 05/15/2017  . SOB (shortness of breath) 02/17/2017  . TMJ click 37-10-62   right side > left  . Varicose vein 09-14-11   bilateral , with tenderness left shin bone near foot    MEDICATIONS: Current Outpatient Prescriptions on File Prior to Visit  Medication Sig Dispense Refill  . albuterol (PROVENTIL HFA;VENTOLIN HFA) 108 (90 Base) MCG/ACT inhaler Inhale 2 puffs into the lungs every 6 (six) hours as needed for wheezing or shortness of breath. 1 Inhaler 0  . aspirin EC 81 MG tablet Take 81 mg by mouth daily.    Marland Kitchen CALCIUM PO Take 600 mg by mouth 2 (two) times daily.     . Cholecalciferol (VITAMIN D3) 2000 UNITS TABS Take 2,000 Units by mouth daily.     . cyclobenzaprine (FLEXERIL) 10 MG tablet Take 20 mg by mouth at bedtime.    Marland Kitchen diltiazem (DILACOR XR) 180 MG 24 hr capsule Take 1 capsule (180 mg total) by mouth at bedtime. 90 capsule 1  . DULoxetine (CYMBALTA) 60 MG capsule Take 60 mg by mouth daily.    . fosinopril (MONOPRIL) 10 MG tablet Take 1 tablet (10 mg total) by mouth at bedtime. 90 tablet 1  . Multiple Vitamin (MULTI-DAY PO) Take by mouth daily.    . pravastatin (PRAVACHOL) 40 MG tablet Take 1 tablet (40 mg total) by mouth at bedtime. 90 tablet 1  . ranitidine (ZANTAC) 150 MG capsule Take 150 mg by mouth 2 (two) times daily.     . tizanidine (ZANAFLEX) 2 MG capsule Take 4 mg by mouth at bedtime.    . traMADol (ULTRAM) 50 MG tablet Take 2 tablets by mouth every 6 (six) hours as needed. Not to exceed 6 tablets a day     No current facility-administered medications on file prior to visit.     ALLERGIES: Allergies  Allergen Reactions  . Ivp Dye [Iodinated Diagnostic Agents]     Rash , hives  . Codeine Itching    Unless something given to counteract itching    FAMILY HISTORY: Family History  Problem Relation Age of Onset  . Aortic aneurysm Father        femoral  aneurysm  . Mental illness Brother        paranoid schizophrenia  . Pneumonia Brother        pneumothorax in [facility]  . Heart attack Unknown        both GF;PGM; M uncle  . Stroke Paternal Aunt        aneurysm  . Colon cancer Paternal Aunt        X 2  . Other Mother        lung tumor which resolved w/o treatment  . Hypertension Mother   . Other Maternal Grandmother        enalraged heart  . Hypertension Maternal Grandmother   . Heart attack Maternal Grandfather        In fifties, died.  . Hypertension Maternal Grandfather   . Heart attack Paternal  Grandmother        In forties.  . Hypertension Paternal Grandmother   . Heart attack Paternal Grandfather   . Heart disease Paternal Grandfather   . Hypertension Paternal Grandfather   . Hypertension Daughter   . Hypertension Son   . Aortic dissection Sister        descending  . Diabetes Neg Hx     SOCIAL HISTORY: Social History   Social History  . Marital status: Married    Spouse name: N/A  . Number of children: 2  . Years of education: N/A   Occupational History  . Not on file.   Social History Main Topics  . Smoking status: Never Smoker  . Smokeless tobacco: Never Used  . Alcohol use Yes     Comment: rarely  . Drug use: No  . Sexual activity: Yes   Other Topics Concern  . Not on file   Social History Narrative   Reg exercise 2 x weekly with trainer, no dietary restrictions. Trying to eat heart healthy.       Retired from [employer].    REVIEW OF SYSTEMS: Constitutional: No fevers, chills, or sweats, no generalized fatigue, change in appetite Eyes: No visual changes, double vision, eye pain Ear, nose and throat: No hearing loss, ear pain, nasal congestion, sore throat Cardiovascular: No chest pain, palpitations Respiratory:  No shortness of breath at rest or with exertion, wheezes GastrointestinaI: No nausea, vomiting, diarrhea, abdominal pain, fecal incontinence Genitourinary:  No dysuria, urinary  retention or frequency Musculoskeletal:  No neck pain, back pain Integumentary: No rash, pruritus, skin lesions Neurological: as above Psychiatric: No depression, insomnia, anxiety Endocrine: No palpitations, fatigue, diaphoresis, mood swings, change in appetite, change in weight, increased thirst Hematologic/Lymphatic:  No anemia, purpura, petechiae. Allergic/Immunologic: no itchy/runny eyes, nasal congestion, recent allergic reactions, rashes  PHYSICAL EXAM: Vitals:   06/07/17 0820  BP: 108/62  Pulse: (!) 107  SpO2: 98%   General: No acute distress Head:  Normocephalic/atraumatic Neck: supple, no paraspinal tenderness, full range of motion Heart:  Regular rate and rhythm Lungs:  Clear to auscultation bilaterally Back: No paraspinal tenderness Skin/Extremities: No rash, no edema Neurological Exam: alert and oriented to person, place, and time. No aphasia or dysarthria. Fund of knowledge is appropriate.  Recent and remote memory are intact.  Attention and concentration are normal.    Able to name objects and repeat phrases. Cranial nerves: Pupils equal, round, reactive to light.  Extraocular movements intact with no nystagmus. Visual fields full. Facial sensation intact. No facial asymmetry. Tongue, uvula, palate midline.  Motor: Bulk and tone normal, muscle strength 5/5 throughout with no pronator drift.  Sensation to light touch intact.  No extinction to double simultaneous stimulation.  Deep tendon reflexes +1 throughout, toes downgoing.  Finger to nose testing intact.  Gait narrow-based and steady, able to tandem walk adequately.  Romberg negative.  IMPRESSION: This is a pleasant 71 yo RH woman with a history of  hypertension, hyperlipidemia, fibromyalgia, who had an episode of loss of consciousness last April 11 or 12, 2018. She reports a history of syncope but these were usually preceded by presyncopal symptoms, however the recent event occurred without warning, she woke up on the  floor with unknown duration of loss of consciousness. She has no clear epilepsy risk factors, neurological exam non-focal. Her MRI brain and 1-hour EEG were normal. She has had a cardiac workup as well with echo showing severe proximal septal hypertrophy, severe LVH, her 21-day holter  did not show any arrhythmia. Episode likely syncope, no clear evidence of seizure. She has a follow-up with Cardiology soon. If symptoms recur, we may consider a 72-hour EEG. She was encouraged to increase fluid hydration and liberalize salt intake. We discussed close clinical monitoring, she will follow-up in 6 months and knows to call for any changes.   Thank you for allowing me to participate in her care.  Please do not hesitate to call for any questions or concerns.  The duration of this appointment visit was 15 minutes of face-to-face time with the patient.  Greater than 50% of this time was spent in counseling, explanation of diagnosis, planning of further management, and coordination of care.   Ellouise Newer, M.D.   CC: Dr. Charlett Blake, Dr. Percival Spanish

## 2017-06-07 NOTE — Patient Instructions (Signed)
1. Continue to monitor symptoms 2. Follow-up in 6 months, call for any changes

## 2017-06-08 NOTE — Progress Notes (Signed)
Cardiology Office Note   Date:  06/09/2017   ID:  Madeline [name], DOB 11-02-1945, MRN [id number]  PCP:  Mosie Lukes, MD  Cardiologist:   Minus Breeding, MD  Referring:  Mosie Lukes, MD  Chief Complaint  Patient presents with  . Shortness of Breath      History of Present Illness: Madeline [name] is a 71 y.o. female who presents for evaluation of dyspnea.  She has had a POET (Plain Old Exercise Treadmill) which was negative for evidence of ischemia.   She did have a past history of syncope and a slightly abnormal EKG.  I had her wear a monitor that demonstrated no arrhythmias.  Echo demonstrated significant LVH on echo with septal hypertrophy.  Since I last saw her she's had no further syncopal episodes. She has continued dyspnea with any activity. She's not describing PND or orthopnea. She's not having any palpitations. She's not describing any chest pressure, neck or arm discomfort. She's not been active because of the shortness of breath.  Past Medical History:  Diagnosis Date  . Anxiety   . Arthritis 09-14-11   osteoarthritis, osteopenia  . Cataracts, both eyes 09-13-11   not surgical yet  . Cholelithiasis 05/17/2016  . Chronic neck pain 05/29/2016  . Cystitis 2007   sepsis post bladder biopsy, Dr Jeffie Pollock  . Fibromyalgia 09-14-11   Dr Donney Dice, [facility]  . Fractures 09-14-11   toes-left foot  . GERD (gastroesophageal reflux disease) 09-14-11   tx. Nexium  . H/O foot surgery 12/19/2011  . Hiatal hernia 05/17/2016  . Hyperglycemia 08/23/2016  . Hyperlipidemia   . Hypertension 09-13-11   tx. meds  . 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 Left ventricular hypertrophy 05/15/2017  . SOB (shortness of breath) 02/17/2017  . TMJ click 40-98-11   right side > left  . 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 ABDOMINAL HYSTERECTOMY  1977   dysfunctional menses; age 43  . APPENDECTOMY  1963  . COLONOSCOPY  2011   negative  . CYSTOSTOMY W/ BLADDER BIOPSY  04-2006  . FOOT ARTHRODESIS  01/03/12   [facility]-- left hallux MP joint arthrodesis  . HAMMER TOE SURGERY  01/03/12   [facility], Dr Doran Durand  . NASAL SINUS SURGERY  09-14-11   right side " sinus lift"  . SIGMOIDOSCOPY    . sling procedure  2006   a and p repair  . TONSILLECTOMY    . TOTAL KNEE ARTHROPLASTY  09/21/2011   Procedure: TOTAL KNEE ARTHROPLASTY;  Surgeon: Gearlean Alf;  Location: [facility];  Service: Orthopedics;  Laterality: Left;  . TOTAL KNEE ARTHROPLASTY  06/25/2012   Procedure: TOTAL KNEE ARTHROPLASTY;  Surgeon: Gearlean Alf, MD;  Location: [facility];  Service: Orthopedics;  Laterality: Right;     Current Outpatient Prescriptions  Medication Sig Dispense Refill  . aspirin EC 81 MG tablet Take 81 mg by mouth daily.    Marland Kitchen CALCIUM PO Take 600 mg by mouth 2 (two) times daily.     . Cholecalciferol (VITAMIN D3) 2000 UNITS TABS Take 2,000 Units by mouth daily.     . cyclobenzaprine (FLEXERIL) 10 MG tablet Take 20 mg by mouth at bedtime.    Marland Kitchen diltiazem (DILACOR XR) 180 MG 24 hr capsule Take 1 capsule (180 mg total) by mouth at bedtime.  90 capsule 1  . DULoxetine (CYMBALTA) 60 MG capsule Take 60 mg by mouth daily.    . fosinopril (MONOPRIL) 10 MG tablet Take 1 tablet (10 mg total) by mouth at bedtime. 90 tablet 1  . Multiple Vitamin (MULTI-DAY PO) Take by mouth daily.    . pravastatin (PRAVACHOL) 40 MG tablet Take 1 tablet (40 mg total) by mouth at bedtime. 90 tablet 1  . ranitidine (ZANTAC) 150 MG capsule Take 150 mg by mouth 2 (two) times daily.     . tizanidine (ZANAFLEX) 2 MG capsule Take 4 mg by mouth at bedtime.    . traMADol (ULTRAM) 50 MG tablet Take 2 tablets by mouth every 6 (six) hours as needed. Not to exceed 6 tablets a day     No current facility-administered medications for this visit.     Allergies:   Ivp dye [iodinated diagnostic agents]  and Codeine    ROS:  Please see the history of present illness.   Otherwise, review of systems are positive for none.   All other systems are reviewed and negative.    PHYSICAL EXAM: VS:  BP 116/80   Pulse 74   Ht 5\' 8"  (1.727 m)   Wt 187 lb (84.8 kg)   BMI 28.43 kg/m  , BMI Body mass index is 28.43 kg/m.  GENERAL:  Well appearing NECK:  No jugular venous distention, waveform within normal limits, carotid upstroke brisk and symmetric, no bruits, no thyromegaly LUNGS:  Clear to auscultation bilaterally CHEST:  Unremarkable HEART:  PMI not displaced or sustained,S1 and S2 within normal limits, no S3, no S4, no clicks, no rubs, no murmurs ABD:  Flat, positive bowel sounds normal in frequency in pitch, no bruits, no rebound, no guarding, no midline pulsatile mass, no hepatomegaly, no splenomegaly EXT:  2 plus pulses throughout, no edema, no cyanosis no clubbing   EKG:  EKG is not  ordered today.     Recent Labs: 01/16/2017: Pro B Natriuretic peptide (BNP) 15.0 02/17/2017: Hemoglobin 13.0; Platelets 237.0; TSH 1.95 05/15/2017: ALT 11; BUN 16; Creatinine, Ser 0.75; Potassium 4.2; Sodium 139    Lipid Panel    Component Value Date/Time   CHOL 178 02/17/2017 1012   TRIG 99.0 02/17/2017 1012   HDL 76.50 02/17/2017 1012   CHOLHDL 2 02/17/2017 1012   VLDL 19.8 02/17/2017 1012   LDLCALC 82 02/17/2017 1012      Wt Readings from Last 3 Encounters:  06/09/17 187 lb (84.8 kg)  06/07/17 187 lb (84.8 kg)  05/15/17 186 lb (84.4 kg)      Other studies Reviewed: Additional studies/ records that were reviewed today include:  Event monitor and echo Review of the above records demonstrates:   As above   ASSESSMENT AND PLAN:  SEPTAL HYPERTROPHY:  I don't see any significant evidence that this is contributing to her dyspnea. Her BNP was normal I will follow this with further imaging in the future. We talked about volume management salt restriction.  DYSPNEA:  As above is not clear that  the etiology is related to the left ventricular hypertrophy. I'm going to check pulmonary function testing.  QT:  This is upper limits of normal.  No change in therapy.   There was no evidence of arrhythmia on her event monitor.   SYNCOPE:  No clear etiology.  She has had no further episodes.    Current medicines are reviewed at length with the patient today.  The patient does not have concerns regarding medicines.  The following changes have been made:  None  Labs/ tests ordered today include:     Orders Placed This Encounter  Procedures  . Pulmonary function test     Disposition:   FU with me in 2 months. Ronnell Guadalajara, MD  06/09/2017 8:17 AM    [facility] [place]

## 2017-06-09 ENCOUNTER — Encounter: Payer: Self-pay | Admitting: Cardiology

## 2017-06-09 ENCOUNTER — Ambulatory Visit (INDEPENDENT_AMBULATORY_CARE_PROVIDER_SITE_OTHER): Payer: Medicare Other | Admitting: Cardiology

## 2017-06-09 VITALS — BP 116/80 | HR 74 | Ht 68.0 in | Wt 187.0 lb

## 2017-06-09 DIAGNOSIS — R0602 Shortness of breath: Secondary | ICD-10-CM

## 2017-06-09 DIAGNOSIS — R55 Syncope and collapse: Secondary | ICD-10-CM | POA: Diagnosis not present

## 2017-06-09 DIAGNOSIS — I422 Other hypertrophic cardiomyopathy: Secondary | ICD-10-CM | POA: Insufficient documentation

## 2017-06-09 NOTE — Patient Instructions (Addendum)
Medication Instructions:  Continue current medications  If you need a refill on your cardiac medications before your next appointment, please call your pharmacy.  Labwork: None Ordered   Testing/Procedures: Your physician has recommended that you have a pulmonary function test. Pulmonary Function Tests are a group of tests that measure how well air moves in and out of your lungs.   Follow-Up: Your physician wants you to follow-up in: 2 Months.   Pt is ok to exercise with trainer   Thank you for choosing [facility] at [facility]!!

## 2017-06-15 ENCOUNTER — Inpatient Hospital Stay (HOSPITAL_COMMUNITY): Admission: RE | Admit: 2017-06-15 | Payer: Medicare Other | Source: Ambulatory Visit

## 2017-06-23 ENCOUNTER — Ambulatory Visit (HOSPITAL_COMMUNITY)
Admission: RE | Admit: 2017-06-23 | Discharge: 2017-06-23 | Disposition: A | Discharge status: Home / Self Care | Payer: Medicare Other | Source: Ambulatory Visit | Attending: Cardiology | Admitting: Cardiology

## 2017-06-23 DIAGNOSIS — R0602 Shortness of breath: Secondary | ICD-10-CM | POA: Diagnosis present

## 2017-06-23 LAB — PULMONARY FUNCTION TEST
DL/VA % pred: 84 %
DL/VA: 4.42 ml/min/mmHg/L
DLCO unc % pred: 73 %
DLCO unc: 21.91 ml/min/mmHg
FEF 25-75 PRE: 2.84 L/s
FEF 25-75 Post: 3.01 L/sec
FEF2575-%CHANGE-POST: 6 %
FEF2575-%Pred-Post: 145 %
FEF2575-%Pred-Pre: 137 %
FEV1-%Change-Post: 2 %
FEV1-%PRED-PRE: 102 %
FEV1-%Pred-Post: 104 %
FEV1-PRE: 2.67 L
FEV1-Post: 2.72 L
FEV1FVC-%CHANGE-POST: 0 %
FEV1FVC-%Pred-Pre: 107 %
FEV6-%CHANGE-POST: 0 %
FEV6-%PRED-PRE: 99 %
FEV6-%Pred-Post: 100 %
FEV6-POST: 3.3 L
FEV6-PRE: 3.27 L
FEV6FVC-%Change-Post: 0 %
FEV6FVC-%PRED-PRE: 104 %
FEV6FVC-%Pred-Post: 103 %
FVC-%Change-Post: 1 %
FVC-%PRED-PRE: 95 %
FVC-%Pred-Post: 96 %
FVC-POST: 3.32 L
FVC-Pre: 3.28 L
POST FEV6/FVC RATIO: 100 %
Post FEV1/FVC ratio: 82 %
Pre FEV1/FVC ratio: 81 %
Pre FEV6/FVC Ratio: 100 %
RV % PRED: 103 %
RV: 2.51 L
TLC % pred: 103 %
TLC: 5.87 L

## 2017-06-23 MED ORDER — ALBUTEROL SULFATE (2.5 MG/3ML) 0.083% IN NEBU
2.5000 mg | INHALATION_SOLUTION | Freq: Once | RESPIRATORY_TRACT | Status: AC
  Administered 2017-06-23: 2.5 mg via RESPIRATORY_TRACT

## 2017-08-04 ENCOUNTER — Encounter: Payer: Self-pay | Admitting: *Deleted

## 2017-08-15 NOTE — Progress Notes (Signed)
Cardiology Office Note   Date:  08/17/2017   ID:  Madeline [name], DOB 02/01/1946, MRN [id number]  PCP:  Mosie Lukes, MD  Cardiologist:   Minus Breeding, MD  Referring:  Mosie Lukes, MD  Chief Complaint  Patient presents with  . LVH      History of Present Illness: Madeline [name] is a 71 y.o. female who presents for evaluation of dyspnea.  She has had a POET (Plain Old Exercise Treadmill) which was negative for evidence of ischemia.   She did have a past history of syncope and a slightly abnormal EKG.  I had her wear a monitor that demonstrated no arrhythmias.  Echo demonstrated significant LVH on echo with septal hypertrophy.   She reports that she continues to have dyspnea.  This has really been going on for a year but she noticed it more this spring.  She is SOB climbing stairs.  She does not think that it is getting worse.  She his not having PND or orthopnea.  She has no chest pain and no presyncope or syncope.  She does have some swelling at times.  She avoids salt.     Past Medical History:  Diagnosis Date  . Anxiety   . Arthritis 09-14-11   osteoarthritis, osteopenia  . Cataracts, both eyes 09-13-11   not surgical yet  . Cholelithiasis 05/17/2016  . Chronic neck pain 05/29/2016  . Cystitis 2007   sepsis post bladder biopsy, Dr Jeffie Pollock  . Fibromyalgia 09-14-11   Dr Donney Dice, [facility]  . Fractures 09-14-11   toes-left foot  . GERD (gastroesophageal reflux disease) 09-14-11   tx. Nexium  . H/O foot surgery 12/19/2011  . Hiatal hernia 05/17/2016  . Hyperglycemia 08/23/2016  . Hyperlipidemia   . Hypertension 09-13-11   tx. meds  . 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 Left ventricular hypertrophy 05/15/2017  . SOB (shortness of breath) 02/17/2017  . TMJ click 40-81-44   right side > left  . 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  ABDOMINAL HYSTERECTOMY  1977   dysfunctional menses; age 80  . APPENDECTOMY  1963  . COLONOSCOPY  2011   negative  . CYSTOSTOMY W/ BLADDER BIOPSY  04-2006  . FOOT ARTHRODESIS  01/03/12   [facility]-- left hallux MP joint arthrodesis  . HAMMER TOE SURGERY  01/03/12   [facility], Dr Doran Durand  . NASAL SINUS SURGERY  09-14-11   right side " sinus lift"  . SIGMOIDOSCOPY    . sling procedure  2006   a and p repair  . TONSILLECTOMY    . TOTAL KNEE ARTHROPLASTY  09/21/2011   Procedure: TOTAL KNEE ARTHROPLASTY;  Surgeon: Gearlean Alf;  Location: [facility];  Service: Orthopedics;  Laterality: Left;  . TOTAL KNEE ARTHROPLASTY  06/25/2012   Procedure: TOTAL KNEE ARTHROPLASTY;  Surgeon: Gearlean Alf, MD;  Location: [facility];  Service: Orthopedics;  Laterality: Right;     Current Outpatient Medications  Medication Sig Dispense Refill  . aspirin EC 81 MG tablet Take 81 mg by mouth daily.    Marland Kitchen CALCIUM PO Take 600 mg by mouth 2 (two) times daily.     . Cholecalciferol (VITAMIN D3) 2000 UNITS TABS Take 2,000 Units by mouth daily.     . cyclobenzaprine (FLEXERIL) 10 MG tablet Take 20 mg  by mouth at bedtime.    . diclofenac sodium (VOLTAREN) 1 % GEL Apply 1 application topically as needed.    . diltiazem (DILACOR XR) 180 MG 24 hr capsule Take 1 capsule (180 mg total) by mouth at bedtime. 90 capsule 1  . DULoxetine (CYMBALTA) 60 MG capsule Take 60 mg by mouth daily.    . fosinopril (MONOPRIL) 10 MG tablet Take 1 tablet (10 mg total) by mouth at bedtime. 90 tablet 1  . Multiple Vitamin (MULTI-DAY PO) Take by mouth daily.    Marland Kitchen oxyCODONE (OXY IR/ROXICODONE) 5 MG immediate release tablet Take 1 tablet by mouth as needed.    . pravastatin (PRAVACHOL) 40 MG tablet Take 1 tablet (40 mg total) by mouth at bedtime. 90 tablet 1  . ranitidine (ZANTAC) 150 MG capsule Take 150 mg by mouth 2 (two) times daily.     . tizanidine (ZANAFLEX) 2 MG capsule Take 4 mg by mouth at bedtime.    . traMADol (ULTRAM) 50 MG  tablet Take 2 tablets by mouth every 6 (six) hours as needed. Not to exceed 6 tablets a day    . furosemide (LASIX) 20 MG tablet Take 1 tablet (20 mg total) by mouth as needed. 30 tablet 3   No current facility-administered medications for this visit.     Allergies:   Ivp dye [iodinated diagnostic agents] and Codeine    ROS:  Please see the history of present illness.   Otherwise, review of systems are positive for none.   All other systems are reviewed and negative.    PHYSICAL EXAM: VS:  BP 129/88   Pulse 95   Ht 5\' 8"  (1.727 m)   Wt 185 lb (83.9 kg)   SpO2 98%   BMI 28.13 kg/m  , BMI Body mass index is 28.13 kg/m.  GENERAL:  Well appearing NECK:  No jugular venous distention, waveform within normal limits, carotid upstroke brisk and symmetric, no bruits, no thyromegaly LUNGS:  Clear to auscultation bilaterally CHEST:  Unremarkable HEART:  PMI not displaced or sustained,S1 and S2 within normal limits, no S3, no S4, no clicks, no rubs, no murmurs ABD:  Flat, positive bowel sounds normal in frequency in pitch, no bruits, no rebound, no guarding, no midline pulsatile mass, no hepatomegaly, no splenomegaly EXT:  2 plus pulses throughout, no edema, no cyanosis no clubbing    EKG:  EKG is  ordered today. Sinus rhythm, rate 84, LAD,  intervals within normal limits, no acute ST-T wave changes.   Recent Labs: 01/16/2017: Pro B Natriuretic peptide (BNP) 15.0 02/17/2017: Hemoglobin 13.0; Platelets 237.0; TSH 1.95 05/15/2017: ALT 11; BUN 16; Creatinine, Ser 0.75; Potassium 4.2; Sodium 139    Lipid Panel    Component Value Date/Time   CHOL 178 02/17/2017 1012   TRIG 99.0 02/17/2017 1012   HDL 76.50 02/17/2017 1012   CHOLHDL 2 02/17/2017 1012   VLDL 19.8 02/17/2017 1012   LDLCALC 82 02/17/2017 1012      Wt Readings from Last 3 Encounters:  08/16/17 185 lb (83.9 kg)  06/09/17 187 lb (84.8 kg)  06/07/17 187 lb (84.8 kg)      Other studies Reviewed: Additional studies/  records that were reviewed today include:  PFTs, echo images Review of the above records demonstrates:   See elsewhere   ASSESSMENT AND PLAN:  SEPTAL HYPERTROPHY:   I am going to send her for a cMRI to further evaluate the dyspnea and septal hypertrophy.  He might need further provacative  testing such as a stress echo to look for a dynamic gradient.   She might need right heart cath as well in the future.  I will give her PRN Lasix to take as well.    DYSPNEA:  As above is not clear that the etiology is related to the left ventricular hypertrophy. This will be evaluated as above.   QT:  This is not prolonged today.  SYNCOPE:  She has had no further events.    Current medicines are reviewed at length with the patient today.  The patient does not have concerns regarding medicines.  The following changes have been made:  See above  Labs/ tests ordered today include:     Orders Placed This Encounter  Procedures  . MR Card Morphology Wo/W Cm  . Basic Metabolic Panel (BMET)     Disposition:   FU with me after the MRI.  Signed, Minus Breeding, MD  08/17/2017 12:56 PM    [facility]

## 2017-08-16 ENCOUNTER — Ambulatory Visit: Payer: Medicare Other | Admitting: Cardiology

## 2017-08-16 ENCOUNTER — Encounter: Payer: Self-pay | Admitting: Cardiology

## 2017-08-16 ENCOUNTER — Telehealth: Payer: Self-pay | Admitting: Cardiology

## 2017-08-16 VITALS — BP 129/88 | HR 95 | Ht 68.0 in | Wt 185.0 lb

## 2017-08-16 DIAGNOSIS — I517 Cardiomegaly: Secondary | ICD-10-CM | POA: Diagnosis not present

## 2017-08-16 DIAGNOSIS — I422 Other hypertrophic cardiomyopathy: Secondary | ICD-10-CM

## 2017-08-16 MED ORDER — FUROSEMIDE 20 MG PO TABS: 20.0000 mg | ORAL_TABLET | ORAL | 3 refills | Status: DC | PRN

## 2017-08-16 NOTE — Patient Instructions (Addendum)
Medication Instructions:  START- Lasix 20 mg as needed  If you need a refill on your cardiac medications before your next appointment, please call your pharmacy.  Labwork: BMP   Testing/Procedures: Your physician has requested that you have a Cardiac MRI.   Follow-Up: Your physician wants you to follow-up in: 2 Months.   Thank you for choosing [facility] at [facility]!!

## 2017-08-16 NOTE — Telephone Encounter (Signed)
Called patient and LVM to call me with the time of day she wants to have the cardiac MRI.

## 2017-08-17 ENCOUNTER — Encounter: Payer: Self-pay | Admitting: Cardiology

## 2017-08-18 ENCOUNTER — Encounter: Payer: Self-pay | Admitting: Cardiology

## 2017-08-18 NOTE — Addendum Note (Signed)
Addended by: Zebedee Iba on: 08/18/2017 03:50 PM   Modules accepted: Orders

## 2017-08-25 LAB — BASIC METABOLIC PANEL
BUN / CREAT RATIO: 18 (ref 12–28)
BUN: 14 mg/dL (ref 8–27)
CHLORIDE: 102 mmol/L (ref 96–106)
CO2: 27 mmol/L (ref 20–29)
CREATININE: 0.77 mg/dL (ref 0.57–1.00)
Calcium: 10.1 mg/dL (ref 8.7–10.3)
GFR calc Af Amer: 90 mL/min/{1.73_m2} (ref >59)
GFR calc non Af Amer: 78 mL/min/{1.73_m2} (ref >59)
GLUCOSE: 107 mg/dL — AB (ref 65–99)
POTASSIUM: 4.9 mmol/L (ref 3.5–5.2)
SODIUM: 143 mmol/L (ref 134–144)

## 2017-08-29 ENCOUNTER — Ambulatory Visit (HOSPITAL_COMMUNITY)
Admission: RE | Admit: 2017-08-29 | Discharge: 2017-08-29 | Disposition: A | Discharge status: Home / Self Care | Payer: Medicare Other | Source: Ambulatory Visit | Attending: Cardiology | Admitting: Cardiology

## 2017-08-29 DIAGNOSIS — I422 Other hypertrophic cardiomyopathy: Secondary | ICD-10-CM | POA: Diagnosis present

## 2017-08-29 MED ORDER — GADOBENATE DIMEGLUMINE 529 MG/ML IV SOLN
27.0000 mL | Freq: Once | INTRAVENOUS | Status: AC | PRN
  Administered 2017-08-29: 27 mL via INTRAVENOUS

## 2017-09-11 ENCOUNTER — Telehealth: Payer: Self-pay | Admitting: Cardiology

## 2017-09-11 NOTE — Telephone Encounter (Signed)
Pt returning call

## 2017-09-11 NOTE — Telephone Encounter (Signed)
Patient calling, states that she received a call from office.

## 2017-09-15 NOTE — Telephone Encounter (Signed)
Pt aware of her MRI

## 2017-10-16 ENCOUNTER — Ambulatory Visit: Payer: Medicare Other | Admitting: Family Medicine

## 2017-10-19 ENCOUNTER — Encounter: Payer: Self-pay | Admitting: Cardiology

## 2017-10-23 ENCOUNTER — Encounter: Payer: Self-pay | Admitting: Family Medicine

## 2017-10-23 ENCOUNTER — Ambulatory Visit: Payer: Medicare Other | Admitting: Family Medicine

## 2017-10-23 DIAGNOSIS — R252 Cramp and spasm: Secondary | ICD-10-CM | POA: Diagnosis not present

## 2017-10-23 DIAGNOSIS — R911 Solitary pulmonary nodule: Secondary | ICD-10-CM | POA: Diagnosis not present

## 2017-10-23 DIAGNOSIS — I10 Essential (primary) hypertension: Secondary | ICD-10-CM | POA: Diagnosis not present

## 2017-10-23 DIAGNOSIS — R739 Hyperglycemia, unspecified: Secondary | ICD-10-CM

## 2017-10-23 DIAGNOSIS — M8580 Other specified disorders of bone density and structure, unspecified site: Secondary | ICD-10-CM | POA: Diagnosis not present

## 2017-10-23 DIAGNOSIS — E782 Mixed hyperlipidemia: Secondary | ICD-10-CM

## 2017-10-23 HISTORY — DX: Cramp and spasm: R25.2

## 2017-10-23 LAB — COMPREHENSIVE METABOLIC PANEL
ALBUMIN: 4.4 g/dL (ref 3.5–5.2)
ALT: 11 U/L (ref 0–35)
AST: 20 U/L (ref 0–37)
Alkaline Phosphatase: 85 U/L (ref 39–117)
BUN: 12 mg/dL (ref 6–23)
CHLORIDE: 100 meq/L (ref 96–112)
CO2: 30 meq/L (ref 19–32)
Calcium: 9.6 mg/dL (ref 8.4–10.5)
Creatinine, Ser: 0.91 mg/dL (ref 0.40–1.20)
GFR: 64.61 mL/min (ref >60.00)
Glucose, Bld: 113 mg/dL — ABNORMAL HIGH (ref 70–99)
POTASSIUM: 4.7 meq/L (ref 3.5–5.1)
SODIUM: 138 meq/L (ref 135–145)
Total Bilirubin: 0.6 mg/dL (ref 0.2–1.2)
Total Protein: 7.5 g/dL (ref 6.0–8.3)

## 2017-10-23 LAB — CBC
HEMATOCRIT: 39.3 % (ref 36.0–46.0)
Hemoglobin: 13 g/dL (ref 12.0–15.0)
MCHC: 33.2 g/dL (ref 30.0–36.0)
MCV: 89.6 fl (ref 78.0–100.0)
Platelets: 259 10*3/uL (ref 150.0–400.0)
RBC: 4.39 Mil/uL (ref 3.87–5.11)
RDW: 13.8 % (ref 11.5–15.5)
WBC: 6.4 10*3/uL (ref 4.0–10.5)

## 2017-10-23 LAB — MAGNESIUM: Magnesium: 2.3 mg/dL (ref 1.5–2.5)

## 2017-10-23 LAB — LIPID PANEL
CHOL/HDL RATIO: 2
CHOLESTEROL: 169 mg/dL (ref 0–200)
HDL: 80.7 mg/dL (ref >39.00)
LDL CALC: 67 mg/dL (ref 0–99)
NonHDL: 88.6
TRIGLYCERIDES: 106 mg/dL (ref 0.0–149.0)
VLDL: 21.2 mg/dL (ref 0.0–40.0)

## 2017-10-23 LAB — HEMOGLOBIN A1C: Hgb A1c MFr Bld: 5.9 % (ref 4.6–6.5)

## 2017-10-23 LAB — TSH: TSH: 3.5 u[IU]/mL (ref 0.35–4.50)

## 2017-10-23 MED ORDER — DILTIAZEM HCL ER 180 MG PO CP24: 180.0000 mg | ORAL_CAPSULE | Freq: Every day | ORAL | 1 refills | Status: DC

## 2017-10-23 MED ORDER — FOSINOPRIL SODIUM 10 MG PO TABS: 10.0000 mg | ORAL_TABLET | Freq: Every day | ORAL | 1 refills | Status: DC

## 2017-10-23 MED ORDER — PRAVASTATIN SODIUM 40 MG PO TABS: 40.0000 mg | ORAL_TABLET | Freq: Every day | ORAL | 1 refills | Status: DC

## 2017-10-23 NOTE — Assessment & Plan Note (Signed)
Check cmp,  Magnesium occurs day and night, hydrate well and try Hyland's leg cramp medicine

## 2017-10-23 NOTE — Patient Instructions (Addendum)
hyland's leg cramp medicine  Td is due next can Prevnar at pharmacy  Sleep Apnea Sleep apnea is a condition that affects breathing. People with sleep apnea have moments during sleep when their breathing pauses briefly or gets shallow. Sleep apnea can cause these symptoms:  Trouble staying asleep.  Sleepiness or tiredness during the day.  Irritability.  Loud snoring.  Morning headaches.  Trouble concentrating.  Forgetting things.  Less interest in sex.  Being sleepy for no reason.  Mood swings.  Personality changes.  Depression.  Waking up a lot during the night to pee (urinate).  Dry mouth.  Sore throat.  Follow these instructions at home:  Make any changes in your routine that your doctor recommends.  Eat a healthy, well-balanced diet.  Take over-the-counter and prescription medicines only as told by your doctor.  Avoid using alcohol, calming medicines (sedatives), and narcotic medicines.  Take steps to lose weight if you are overweight.  If you were given a machine (device) to use while you sleep, use it only as told by your doctor.  Do not use any tobacco products, such as cigarettes, chewing tobacco, and e-cigarettes. If you need help quitting, ask your doctor.  Keep all follow-up visits as told by your doctor. This is important. Contact a doctor if:  The machine that you were given to use during sleep is uncomfortable or does not seem to be working.  Your symptoms do not get better.  Your symptoms get worse. Get help right away if:  Your chest hurts.  You have trouble breathing in enough air (shortness of breath).  You have an uncomfortable feeling in your back, arms, or stomach.  You have trouble talking.  One side of your body feels weak.  A part of your face is hanging down (drooping). These symptoms may be an emergency. Do not wait to see if the symptoms will go away. Get medical help right away. Call your local emergency services  (911 in the [place] Do not drive yourself to the hospital. This information is not intended to replace advice given to you by your health care provider. Make sure you discuss any questions you have with your health care provider. Document Released: 06/14/2008 Document Revised: 05/01/2016 Document Reviewed: 06/15/2015 Elsevier Interactive Patient Education  [employer].

## 2017-10-23 NOTE — Assessment & Plan Note (Signed)
Encouraged heart healthy diet, increase exercise, avoid trans fats, consider a krill oil cap daily 

## 2017-10-23 NOTE — Progress Notes (Signed)
Subjective:  I acted as a Education administrator for [employer]. Yancey Flemings, [place]   Patient ID: Madeline [name], female    DOB: 18-Apr-1946, 72 y.o.   MRN: [id number]  Chief Complaint  Patient presents with  . Follow-up    HPI  Patient is in today for follow up visit. She feels well today. Her greatest concern is she has been having increased muscle cramps lately and she acknowledges that she has not been hydrating well. No recent febrile illness or hospitalizations. No polyuria or polydipsia. Has also noted some headaches but none today. Denies CP/palp/SOB/congestion/fevers/GI or GU c/o. Taking meds as prescribed  Patient Care Team: Mosie Lukes, MD as PCP - General (Family Medicine)   Past Medical History:  Diagnosis Date  . Anxiety   . Arthritis 09-14-11   osteoarthritis, osteopenia  . Cataracts, both eyes 09-13-11   not surgical yet  . Cholelithiasis 05/17/2016  . Chronic neck pain 05/29/2016  . Cystitis 2007   sepsis post bladder biopsy, Dr Jeffie Pollock  . Fibromyalgia 09-14-11   Dr Donney Dice, [facility]  . Fractures 09-14-11   toes-left foot  . GERD (gastroesophageal reflux disease) 09-14-11   tx. Nexium  . H/O foot surgery 12/19/2011  . Hiatal hernia 05/17/2016  . Hyperglycemia 08/23/2016  . Hyperlipidemia   . Hypertension 09-13-11   tx. meds  . 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 Left ventricular hypertrophy 05/15/2017  . Muscle cramping 10/23/2017  . SOB (shortness of breath) 02/17/2017  . TMJ click 52-77-82   right side > left  . 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 ABDOMINAL HYSTERECTOMY  1977   dysfunctional menses; age 84  . APPENDECTOMY  1963  . COLONOSCOPY  2011   negative  . CYSTOSTOMY W/ BLADDER BIOPSY  04-2006  . FOOT ARTHRODESIS  01/03/12   [facility]-- left hallux MP joint arthrodesis  . HAMMER TOE SURGERY  01/03/12   [facility], Dr Doran Durand  .  NASAL SINUS SURGERY  09-14-11   right side " sinus lift"  . SIGMOIDOSCOPY    . sling procedure  2006   a and p repair  . TONSILLECTOMY    . TOTAL KNEE ARTHROPLASTY  09/21/2011   Procedure: TOTAL KNEE ARTHROPLASTY;  Surgeon: Gearlean Alf;  Location: [facility];  Service: Orthopedics;  Laterality: Left;  . TOTAL KNEE ARTHROPLASTY  06/25/2012   Procedure: TOTAL KNEE ARTHROPLASTY;  Surgeon: Gearlean Alf, MD;  Location: [facility];  Service: Orthopedics;  Laterality: Right;    Family History  Problem Relation Age of Onset  . Aortic aneurysm Father        femoral aneurysm  . Mental illness Brother        paranoid schizophrenia  . Pneumonia Brother        pneumothorax in [place]  . Heart attack Unknown        both GF;PGM; M uncle  . Stroke Paternal Aunt        aneurysm  . Colon cancer Paternal Aunt        X 2  . Other Mother        lung tumor which resolved w/o treatment  . Hypertension Mother   . Other Maternal Grandmother        enalraged heart  . Hypertension Maternal Grandmother   . Heart attack Maternal Grandfather  In fifties, died.  . Hypertension Maternal Grandfather   . Heart attack Paternal Grandmother        In forties.  . Hypertension Paternal Grandmother   . Heart attack Paternal Grandfather   . Heart disease Paternal Grandfather   . Hypertension Paternal Grandfather   . Hypertension Daughter   . Hypertension Son   . Aortic dissection Sister        descending  . Diabetes Neg Hx     Social History   Socioeconomic History  . Marital status: Married    Spouse name: Not on file  . Number of children: 2  . Years of education: Not on file  . Highest education level: Not on file  Social Needs  . Financial resource strain: Not on file  . Food insecurity - worry: Not on file  . Food insecurity - inability: Not on file  . Transportation needs - medical: Not on file  . Transportation needs - non-medical: Not on file  Occupational History  . Not on file    Tobacco Use  . Smoking status: Never Smoker  . Smokeless tobacco: Never Used  Substance and Sexual Activity  . Alcohol use: Yes    Comment: rarely  . Drug use: No  . Sexual activity: Yes  Other Topics Concern  . Not on file  Social History Narrative   Reg exercise 2 x weekly with trainer, no dietary restrictions. Trying to eat heart healthy.       Retired from [employer].    Outpatient Medications Prior to Visit  Medication Sig Dispense Refill  . aspirin EC 81 MG tablet Take 81 mg by mouth daily.    Marland Kitchen CALCIUM PO Take 600 mg by mouth 2 (two) times daily.     . Cholecalciferol (VITAMIN D3) 2000 UNITS TABS Take 2,000 Units by mouth daily.     . cyclobenzaprine (FLEXERIL) 10 MG tablet Take 20 mg by mouth at bedtime.    . diclofenac sodium (VOLTAREN) 1 % GEL Apply 1 application topically as needed.    . DULoxetine (CYMBALTA) 60 MG capsule Take 60 mg by mouth daily.    . furosemide (LASIX) 20 MG tablet Take 1 tablet (20 mg total) by mouth as needed. 30 tablet 3  . Multiple Vitamin (MULTI-DAY PO) Take by mouth daily.    Marland Kitchen oxyCODONE (OXY IR/ROXICODONE) 5 MG immediate release tablet Take 1 tablet by mouth as needed.    . ranitidine (ZANTAC) 150 MG capsule Take 150 mg by mouth 2 (two) times daily.     . tizanidine (ZANAFLEX) 2 MG capsule Take 4 mg by mouth at bedtime.    . traMADol (ULTRAM) 50 MG tablet Take 2 tablets by mouth every 6 (six) hours as needed. Not to exceed 6 tablets a day    . diltiazem (DILACOR XR) 180 MG 24 hr capsule Take 1 capsule (180 mg total) by mouth at bedtime. 90 capsule 1  . fosinopril (MONOPRIL) 10 MG tablet Take 1 tablet (10 mg total) by mouth at bedtime. 90 tablet 1  . pravastatin (PRAVACHOL) 40 MG tablet Take 1 tablet (40 mg total) by mouth at bedtime. 90 tablet 1   No facility-administered medications prior to visit.     Allergies  Allergen Reactions  . Ivp Dye [Iodinated Diagnostic Agents]     Rash , hives  . Codeine Itching    Unless something given to  counteract itching    Review of Systems  Constitutional: Negative for fever and malaise/fatigue.  HENT: Negative for congestion.   Eyes: Negative for blurred vision.  Respiratory: Negative for shortness of breath.   Cardiovascular: Negative for chest pain, palpitations and leg swelling.  Gastrointestinal: Negative for abdominal pain, blood in stool and nausea.  Genitourinary: Negative for dysuria and frequency.  Musculoskeletal: Positive for myalgias. Negative for falls.  Skin: Negative for rash.  Neurological: Negative for dizziness, loss of consciousness and headaches.  Endo/Heme/Allergies: Negative for environmental allergies.  Psychiatric/Behavioral: Negative for depression. The patient is not nervous/anxious.        Objective:    Physical Exam  Constitutional: She is oriented to person, place, and time. She appears well-developed and well-nourished. No distress.  HENT:  Head: Normocephalic and atraumatic.  Nose: Nose normal.  Eyes: Right eye exhibits no discharge. Left eye exhibits no discharge.  Neck: Normal range of motion. Neck supple.  Cardiovascular: Normal rate and regular rhythm.  No murmur heard. Pulmonary/Chest: Effort normal and breath sounds normal.  Abdominal: Soft. Bowel sounds are normal. There is no tenderness.  Musculoskeletal: She exhibits no edema.  Neurological: She is alert and oriented to person, place, and time.  Skin: Skin is warm and dry.  Psychiatric: She has a normal mood and affect.  Nursing note and vitals reviewed.   BP 126/74 (BP Location: Left Arm, Patient Position: Sitting, Cuff Size: Normal)   Pulse 67   Temp 98 F (36.7 C) (Oral)   Resp 18   Ht 5' 8.11" (1.73 m)   Wt 191 lb 6.4 oz (86.8 kg)   SpO2 98%   BMI 29.01 kg/m  Wt Readings from Last 3 Encounters:  10/27/17 190 lb 9.6 oz (86.5 kg)  10/23/17 191 lb 6.4 oz (86.8 kg)  08/16/17 185 lb (83.9 kg)   BP Readings from Last 3 Encounters:  10/27/17 125/90  10/23/17 126/74    08/16/17 129/88     Immunization History  Administered Date(s) Administered  . Influenza Split 06/30/2011, 06/11/2012  . Influenza Whole 07/18/2007, 06/18/2008  . Influenza,inj,Quad PF,6+ Mos 06/04/2015  . Influenza-Unspecified 06/25/2013, 07/05/2014, 08/05/2016, 06/21/2017  . Pneumococcal Conjugate-13 10/19/2010  . Pneumococcal Polysaccharide-23 06/25/2008, 03/20/2010, 10/07/2013  . Td 12/08/2008  . Zoster Recombinat (Shingrix) 02/17/2017, 05/19/2017    Health Maintenance  Topic Date Due  . COLONOSCOPY  03/18/2018  . MAMMOGRAM  08/29/2018  . TETANUS/TDAP  12/09/2018  . INFLUENZA VACCINE  Completed  . DEXA SCAN  Completed  . Hepatitis C Screening  Completed  . PNA vac Low Risk Adult  Completed    Lab Results  Component Value Date   WBC 6.4 10/23/2017   HGB 13.0 10/23/2017   HCT 39.3 10/23/2017   PLT 259.0 10/23/2017   GLUCOSE 113 (H) 10/23/2017   CHOL 169 10/23/2017   TRIG 106.0 10/23/2017   HDL 80.70 10/23/2017   LDLCALC 67 10/23/2017   ALT 11 10/23/2017   AST 20 10/23/2017   NA 138 10/23/2017   K 4.7 10/23/2017   CL 100 10/23/2017   CREATININE 0.91 10/23/2017   BUN 12 10/23/2017   CO2 30 10/23/2017   TSH 3.50 10/23/2017   INR 1.02 06/15/2012   HGBA1C 5.9 10/23/2017    Lab Results  Component Value Date   TSH 3.50 10/23/2017   Lab Results  Component Value Date   WBC 6.4 10/23/2017   HGB 13.0 10/23/2017   HCT 39.3 10/23/2017   MCV 89.6 10/23/2017   PLT 259.0 10/23/2017   Lab Results  Component Value Date   NA 138 10/23/2017  K 4.7 10/23/2017   CO2 30 10/23/2017   GLUCOSE 113 (H) 10/23/2017   BUN 12 10/23/2017   CREATININE 0.91 10/23/2017   BILITOT 0.6 10/23/2017   ALKPHOS 85 10/23/2017   AST 20 10/23/2017   ALT 11 10/23/2017   PROT 7.5 10/23/2017   ALBUMIN 4.4 10/23/2017   CALCIUM 9.6 10/23/2017   ANIONGAP 9 07/18/2016   GFR 64.61 10/23/2017   Lab Results  Component Value Date   CHOL 169 10/23/2017   Lab Results  Component Value  Date   HDL 80.70 10/23/2017   Lab Results  Component Value Date   LDLCALC 67 10/23/2017   Lab Results  Component Value Date   TRIG 106.0 10/23/2017   Lab Results  Component Value Date   CHOLHDL 2 10/23/2017   Lab Results  Component Value Date   HGBA1C 5.9 10/23/2017         Assessment & Plan:   Problem List Items Addressed This Visit    Hyperlipidemia    Encouraged heart healthy diet, increase exercise, avoid trans fats, consider a krill oil cap daily      Relevant Medications   pravastatin (PRAVACHOL) 40 MG tablet   Other Relevant Orders   Lipid panel (Completed)   Essential hypertension    Well controlled, no changes to meds. Encouraged heart healthy diet such as the DASH diet and exercise as tolerated.       Relevant Medications   pravastatin (PRAVACHOL) 40 MG tablet   Other Relevant Orders   CBC (Completed)   Comprehensive metabolic panel (Completed)   TSH (Completed)   Osteopenia    Encouraged to get adequate exercise, calcium and vitamin d intake      Hyperglycemia    hgba1c acceptable, minimize simple carbs. Increase exercise as tolerated      Relevant Orders   Hemoglobin A1c (Completed)   Pulmonary nodule    CT of chest 01/17/17 showed a 4 mm pulm nodule on right. Repeat CT if radiology suggests      Muscle cramping    Check cmp,  Magnesium occurs day and night, hydrate well and try Hyland's leg cramp medicine      Relevant Orders   Magnesium (Completed)      I have discontinued Sherry Ruffing. Youngers's fosinopril and diltiazem. I am also having her maintain her Vitamin D3, Multiple Vitamin (MULTI-DAY PO), CALCIUM PO, tizanidine, cyclobenzaprine, aspirin EC, ranitidine, traMADol, DULoxetine, diclofenac sodium, oxyCODONE, furosemide, and pravastatin.  Meds ordered this encounter  Medications  . DISCONTD: fosinopril (MONOPRIL) 10 MG tablet    Sig: Take 1 tablet (10 mg total) by mouth at bedtime.    Dispense:  90 tablet    Refill:  1  .  DISCONTD: diltiazem (DILACOR XR) 180 MG 24 hr capsule    Sig: Take 1 capsule (180 mg total) by mouth at bedtime.    Dispense:  90 capsule    Refill:  1  . pravastatin (PRAVACHOL) 40 MG tablet    Sig: Take 1 tablet (40 mg total) by mouth at bedtime.    Dispense:  90 tablet    Refill:  1    CMA served as Education administrator during this visit. History, Physical and Plan performed by medical provider. Documentation and orders reviewed and attested to.  Penni Homans, MD

## 2017-10-23 NOTE — Assessment & Plan Note (Signed)
CT of chest 01/17/17 showed a 4 mm pulm nodule on right. Repeat CT if radiology suggests

## 2017-10-23 NOTE — Assessment & Plan Note (Signed)
hgba1c acceptable, minimize simple carbs. Increase exercise as tolerated.  

## 2017-10-23 NOTE — Assessment & Plan Note (Signed)
Well controlled, no changes to meds. Encouraged heart healthy diet such as the DASH diet and exercise as tolerated.  °

## 2017-10-26 NOTE — Progress Notes (Signed)
Cardiology Office Note   Date:  10/27/2017   ID:  Madeline [name], DOB 1946/01/11, MRN [id number]  PCP:  Mosie Lukes, MD  Cardiologist:   Minus Breeding, MD  Referring:  Mosie Lukes, MD  Chief Complaint  Patient presents with  . Cardiomyopathy      History of Present Illness: Madeline [name] is a 72 y.o. female who presents for evaluation of dyspnea.  She has had a POET (Plain Old Exercise Treadmill) which was negative for evidence of ischemia.   She did have a past history of syncope and a slightly abnormal EKG.  I had her wear a monitor that demonstrated no arrhythmias.  Echo demonstrated significant LVH on echo with septal hypertrophy. MRI confirmed severe basal septal hypertrophy without uptake of gad.    She still gets short of breath with activity such as walking across level ground in her house.  She denies any PND or orthopnea.  She does occasionally get some chest pressure which might last all day long.  She does not bring this all with activities.  She is not having any new palpitations, presyncope or syncope.  She said no weight gain or edema.t.     Past Medical History:  Diagnosis Date  . Anxiety   . Arthritis 09-14-11   osteoarthritis, osteopenia  . Cataracts, both eyes 09-13-11   not surgical yet  . Cholelithiasis 05/17/2016  . Chronic neck pain 05/29/2016  . Cystitis 2007   sepsis post bladder biopsy, Dr Jeffie Pollock  . Fibromyalgia 09-14-11   Dr Donney Dice, [facility]  . Fractures 09-14-11   toes-left foot  . GERD (gastroesophageal reflux disease) 09-14-11   tx. Nexium  . H/O foot surgery 12/19/2011  . Hiatal hernia 05/17/2016  . Hyperglycemia 08/23/2016  . Hyperlipidemia   . Hypertension 09-13-11   tx. meds  . 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 Left ventricular hypertrophy 05/15/2017  . Muscle cramping 10/23/2017  . SOB (shortness of breath) 02/17/2017  . TMJ click 02-72-53   right side > left  . 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 ABDOMINAL HYSTERECTOMY  1977   dysfunctional menses; age 23  . APPENDECTOMY  1963  . COLONOSCOPY  2011   negative  . CYSTOSTOMY W/ BLADDER BIOPSY  04-2006  . FOOT ARTHRODESIS  01/03/12   [facility]-- left hallux MP joint arthrodesis  . HAMMER TOE SURGERY  01/03/12   [facility], Dr Doran Durand  . NASAL SINUS SURGERY  09-14-11   right side " sinus lift"  . SIGMOIDOSCOPY    . sling procedure  2006   a and p repair  . TONSILLECTOMY    . TOTAL KNEE ARTHROPLASTY  09/21/2011   Procedure: TOTAL KNEE ARTHROPLASTY;  Surgeon: Gearlean Alf;  Location: [facility];  Service: Orthopedics;  Laterality: Left;  . TOTAL KNEE ARTHROPLASTY  06/25/2012   Procedure: TOTAL KNEE ARTHROPLASTY;  Surgeon: Gearlean Alf, MD;  Location: [facility];  Service: Orthopedics;  Laterality: Right;     Current Outpatient Medications  Medication Sig Dispense Refill  . aspirin EC 81 MG tablet Take 81 mg by mouth daily.    Marland Kitchen CALCIUM PO Take 600 mg by mouth 2 (two) times daily.     . Cholecalciferol (VITAMIN D3) 2000 UNITS TABS Take 2,000 Units by mouth daily.     Marland Kitchen  cyclobenzaprine (FLEXERIL) 10 MG tablet Take 20 mg by mouth at bedtime.    . diclofenac sodium (VOLTAREN) 1 % GEL Apply 1 application topically as needed.    . DULoxetine (CYMBALTA) 60 MG capsule Take 60 mg by mouth daily.    . fosinopril (MONOPRIL) 10 MG tablet Take 0.5 tablets (5 mg total) by mouth at bedtime. 90 tablet 1  . furosemide (LASIX) 20 MG tablet Take 1 tablet (20 mg total) by mouth as needed. 30 tablet 3  . Multiple Vitamin (MULTI-DAY PO) Take by mouth daily.    Marland Kitchen oxyCODONE (OXY IR/ROXICODONE) 5 MG immediate release tablet Take 1 tablet by mouth as needed.    . pravastatin (PRAVACHOL) 40 MG tablet Take 1 tablet (40 mg total) by mouth at bedtime. 90 tablet 1  . ranitidine (ZANTAC) 150 MG capsule Take 150 mg by mouth 2 (two) times daily.     .  tizanidine (ZANAFLEX) 2 MG capsule Take 4 mg by mouth at bedtime.    . traMADol (ULTRAM) 50 MG tablet Take 2 tablets by mouth every 6 (six) hours as needed. Not to exceed 6 tablets a day    . verapamil (VERELAN PM) 180 MG 24 hr capsule Take 1 capsule (180 mg total) by mouth at bedtime. 90 capsule 3   No current facility-administered medications for this visit.     Allergies:   Ivp dye [iodinated diagnostic agents] and Codeine    ROS:  Please see the history of present illness.   Otherwise, review of systems are positive for none.   All other systems are reviewed and negative.    PHYSICAL EXAM: VS:  BP 125/90   Pulse 96   Ht 5\' 8"  (1.727 m)   Wt 190 lb 9.6 oz (86.5 kg)   BMI 28.98 kg/m  , BMI Body mass index is 28.98 kg/m.  GENERAL:  Well appearing NECK:  No jugular venous distention, waveform within normal limits, carotid upstroke brisk and symmetric, no bruits, no thyromegaly LUNGS:  Clear to auscultation bilaterally CHEST:  Unremarkable HEART:  PMI not displaced or sustained,S1 and S2 within normal limits, no S3, no S4, no clicks, no rubs, soft apical systolic murmur not increasing with the strain phase of Valsalva murmurs ABD:  Flat, positive bowel sounds normal in frequency in pitch, no bruits, no rebound, no guarding, no midline pulsatile mass, no hepatomegaly, no splenomegaly EXT:  2 plus pulses throughout, no edema, no cyanosis no clubbing   EKG:  EKG is not ordered today. Sinus rhythm, rate 84, LAD,  intervals within normal limits, no acute ST-T wave changes.   Recent Labs: 01/16/2017: Pro B Natriuretic peptide (BNP) 15.0 10/23/2017: ALT 11; BUN 12; Creatinine, Ser 0.91; Hemoglobin 13.0; Magnesium 2.3; Platelets 259.0; Potassium 4.7; Sodium 138; TSH 3.50    Lipid Panel    Component Value Date/Time   CHOL 169 10/23/2017 1058   TRIG 106.0 10/23/2017 1058   HDL 80.70 10/23/2017 1058   CHOLHDL 2 10/23/2017 1058   VLDL 21.2 10/23/2017 1058   LDLCALC 67 10/23/2017 1058        Wt Readings from Last 3 Encounters:  10/27/17 190 lb 9.6 oz (86.5 kg)  10/23/17 191 lb 6.4 oz (86.8 kg)  08/16/17 185 lb (83.9 kg)      Other studies Reviewed: Additional studies/ records that were reviewed today include:  MRI Review of the above records demonstrates:      ASSESSMENT AND PLAN:  SEPTAL HYPERTROPHY:   We discussed the physiology  of this.  I would like her to have her to get an echocardiogram.  She has no high risk findings and she is had a treadmill monitor and now the MRI.  She may be symptomatic with this however.  I am going to stop her Cardizem and switch to verapamil to see if she has symptomatic improvement.  I might consider stress echocardiography in the future to see if she has an inducible gradient.  I am also going to reduce her afterload reduction by reducing the lisinopril to 5 mg daily.   DYSPNEA:  This will be treated as above.  Of note previous BNP was normal.above.   QT:  This was not prolonged at the last reading and I will follow this in the future with EKGs.  SYNCOPE:  She has had no further events.   Current medicines are reviewed at length with the patient today.  The patient does not have concerns regarding medicines.  The following changes have been made:  As bove  Labs/ tests ordered today include:   None  No orders of the defined types were placed in this encounter.    Disposition:   FU with me 3 months.   Signed, Minus Breeding, MD  10/27/2017 1:52 PM    [facility]

## 2017-10-27 ENCOUNTER — Encounter: Payer: Self-pay | Admitting: Cardiology

## 2017-10-27 ENCOUNTER — Ambulatory Visit: Payer: Medicare Other | Admitting: Cardiology

## 2017-10-27 VITALS — BP 125/90 | HR 96 | Ht 68.0 in | Wt 190.6 lb

## 2017-10-27 DIAGNOSIS — R0602 Shortness of breath: Secondary | ICD-10-CM

## 2017-10-27 DIAGNOSIS — I422 Other hypertrophic cardiomyopathy: Secondary | ICD-10-CM

## 2017-10-27 DIAGNOSIS — I10 Essential (primary) hypertension: Secondary | ICD-10-CM

## 2017-10-27 MED ORDER — VERAPAMIL HCL ER 180 MG PO CP24: 180.0000 mg | ORAL_CAPSULE | Freq: Every day | ORAL | 3 refills | Status: DC

## 2017-10-27 MED ORDER — FOSINOPRIL SODIUM 10 MG PO TABS: 5.0000 mg | ORAL_TABLET | Freq: Every day | ORAL | 1 refills | Status: DC

## 2017-10-27 NOTE — Patient Instructions (Signed)
Medication Instructions:  STOP Cardizem START- Verapamil 180 mg daily DECREASE- Fosinopril 5 mg daily  If you need a refill on your cardiac medications before your next appointment, please call your pharmacy.  Labwork: None Ordered   Testing/Procedures: None Ordered  Follow-Up: Your physician wants you to follow-up in: 3 Months.     Thank you for choosing [facility] at [facility]!!

## 2017-10-29 NOTE — Assessment & Plan Note (Signed)
Encouraged to get adequate exercise, calcium and vitamin d intake 

## 2017-12-06 ENCOUNTER — Ambulatory Visit: Payer: Medicare Other | Admitting: Neurology

## 2017-12-08 ENCOUNTER — Other Ambulatory Visit: Payer: Self-pay

## 2017-12-08 ENCOUNTER — Encounter: Payer: Self-pay | Admitting: Neurology

## 2017-12-08 ENCOUNTER — Ambulatory Visit: Payer: Medicare Other | Admitting: Neurology

## 2017-12-08 VITALS — BP 118/86 | HR 102 | Ht 68.0 in | Wt 185.0 lb

## 2017-12-08 DIAGNOSIS — R413 Other amnesia: Secondary | ICD-10-CM

## 2017-12-08 DIAGNOSIS — R55 Syncope and collapse: Secondary | ICD-10-CM | POA: Diagnosis not present

## 2017-12-08 NOTE — Patient Instructions (Signed)
Good seeing you! Follow-up in 1 year, call for any changes  RECOMMENDATIONS FOR ALL PATIENTS WITH MEMORY PROBLEMS: 1. Continue to exercise (Recommend 30 minutes of walking everyday, or 3 hours every week) 2. Increase social interactions - continue going to [place] and enjoy social gatherings with friends and family 3. Eat healthy, avoid fried foods and eat more fruits and vegetables 4. Maintain adequate blood pressure, blood sugar, and blood cholesterol level. Reducing the risk of stroke and cardiovascular disease also helps promoting better memory. 5. Avoid stressful situations. Live a simple life and avoid aggravations. Organize your time and prepare for the next day in anticipation. 6. Sleep well, avoid any interruptions of sleep and avoid any distractions in the bedroom that may interfere with adequate sleep quality 7. Avoid sugar, avoid sweets as there is a strong link between excessive sugar intake, diabetes, and cognitive impairment We discussed the Mediterranean diet, which has been shown to help patients reduce the risk of progressive memory disorders and reduces cardiovascular risk. This includes eating fish, eat fruits and green leafy vegetables, nuts like almonds and hazelnuts, walnuts, and also use olive oil. Avoid fast foods and fried foods as much as possible. Avoid sweets and sugar as sugar use has been linked to worsening of memory function.

## 2017-12-08 NOTE — Progress Notes (Signed)
NEUROLOGY FOLLOW UP OFFICE NOTE  JURNEY OVERACKER [id number] [birth year]  HISTORY OF PRESENT ILLNESS: I had the pleasure of seeing Mersades Barbaro in follow-up in the neurology clinic on 12/08/2017.  The patient was last seen 6 months ago after an episode of loss of consciousness last April 2018. MRI brain without contrast done 02/05/17 did not show any acute changes, there was mild diffuse atrophy and moderate chronic microvascular disease. Her 1-hour EEG was normal. She has had an echo showing severe proximal septal hypertrophy, severe LVH, EF 65-70%. She had a 21-day holter with no arrhythmia seen. She denies any further syncopal spells since April 2018. She has occasional shortness of breath. She feels her memory is "as good as it was." She does not drive. She denies missing medications. Her husband is in charge of bills. She reports constipation worsened when she took Verapamil. She denies any headaches, vision changes, focal numbness/tingling/weakness, no further falls.  HPI 01/27/2017: This is a pleasant 72 yo RH woman with a history of hypertension, hyperlipidemia, fibromyalgia, who presented for evaluation of an episode of loss of consciousness last April 11 or 72. She was in [place] and woke up in the middle of the night, recalls getting up and walking around, then woke up on the floor. She did not have any warning symptoms, no dizziness or palpitations, when she came to it was still dark outside but she did not know how long she was out. There was a big bruise on her right hip, no tongue bite, urinary incontinence, or focal weakness. She reports a history of syncope with low BP when she was pregnant, but would always have a warning of seeing shades coming down, hearing distortion where she could hear but could not distinguish words. The last time she felt this way was years ago while sitting under the sun for a graduation but did not fully lose consciousness. She recalls being sick with diarrhea one  time and fainting with no warning sympotms.   She denies any staring/unresponsive episodes, gaps in time, olfactory/gustatory hallucinations, deja vu, rising epigastric sensation, myoclonic jerks. She has arthritis in her neck with numbness and tingling in the left ulnar distribution in her hand. She used to have migraines but denies any recent headaches. She gets lightheaded and short of breath going up stairs and has balance issues. She reports seeing neurologist Dr. Erling Cruz in the 1990s for problems with her memory. She could not remember fun times her family would reminisce about. She had an MRI and was told "there is something there but did not want to call it a stroke." Over the past year she stopped Nexium due to concern for side effect of memory changes, but feels Zantac does not help as much with GERD. She also stopped the Lyrica for fibromyalgia which her son begged her to stop in the summer of 2017. Sometimes she feels she is doing better cognitively, but the Lyrica was helping with her pain. There is no family history of similar symptoms. She has 3 first cousins with cerebral aneurysm. Otherwise she had a normal birth and early development.  There is no history of febrile convulsions, CNS infections such as meningitis/encephalitis, significant traumatic brain injury, neurosurgical procedures, or family history of seizures.  PAST MEDICAL HISTORY: Past Medical History:  Diagnosis Date  . Anxiety   . Arthritis 09-14-11   osteoarthritis, osteopenia  . Cataracts, both eyes 09-13-11   not surgical yet  . Cholelithiasis 05/17/2016  . Chronic neck pain 05/29/2016  .  Cystitis 2007   sepsis post bladder biopsy, Dr Jeffie Pollock  . Fibromyalgia 09-14-11   Dr Donney Dice, [facility]  . Fractures 09-14-11   toes-left foot  . GERD (gastroesophageal reflux disease) 09-14-11   tx. Nexium  . H/O foot surgery 12/19/2011  . Hiatal hernia 05/17/2016  . Hyperglycemia 08/23/2016  . Hyperlipidemia   .  Hypertension 09-13-11   tx. meds  . 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 Left ventricular hypertrophy 05/15/2017  . Muscle cramping 10/23/2017  . SOB (shortness of breath) 02/17/2017  . TMJ click 81-82-99   right side > left  . Varicose vein 09-14-11   bilateral , with tenderness left shin bone near foot    MEDICATIONS: Current Outpatient Medications on File Prior to Visit  Medication Sig Dispense Refill  . aspirin EC 81 MG tablet Take 81 mg by mouth daily.    Marland Kitchen CALCIUM PO Take 600 mg by mouth 2 (two) times daily.     . Cholecalciferol (VITAMIN D3) 2000 UNITS TABS Take 2,000 Units by mouth daily.     . cyclobenzaprine (FLEXERIL) 10 MG tablet Take 20 mg by mouth at bedtime.    . diclofenac sodium (VOLTAREN) 1 % GEL Apply 1 application topically as needed.    . DULoxetine (CYMBALTA) 60 MG capsule Take 60 mg by mouth daily.    . fosinopril (MONOPRIL) 10 MG tablet Take 0.5 tablets (5 mg total) by mouth at bedtime. 90 tablet 1  . furosemide (LASIX) 20 MG tablet Take 1 tablet (20 mg total) by mouth as needed. 30 tablet 3  . Multiple Vitamin (MULTI-DAY PO) Take by mouth daily.    Marland Kitchen oxyCODONE (OXY IR/ROXICODONE) 5 MG immediate release tablet Take 1 tablet by mouth as needed.    . pravastatin (PRAVACHOL) 40 MG tablet Take 1 tablet (40 mg total) by mouth at bedtime. 90 tablet 1  . ranitidine (ZANTAC) 150 MG capsule Take 150 mg by mouth 2 (two) times daily.     . tizanidine (ZANAFLEX) 2 MG capsule Take 4 mg by mouth at bedtime.    . traMADol (ULTRAM) 50 MG tablet Take 2 tablets by mouth every 6 (six) hours as needed. Not to exceed 6 tablets a day    . verapamil (VERELAN PM) 180 MG 24 hr capsule Take 1 capsule (180 mg total) by mouth at bedtime. 90 capsule 3   No current facility-administered medications on file prior to visit.     ALLERGIES: Allergies  Allergen Reactions  . Ivp Dye [Iodinated Diagnostic Agents]     Rash , hives  . Codeine Itching    Unless something  given to counteract itching    FAMILY HISTORY: Family History  Problem Relation Age of Onset  . Aortic aneurysm Father        femoral aneurysm  . Mental illness Brother        paranoid schizophrenia  . Pneumonia Brother        pneumothorax in [place]  . Heart attack Unknown        both GF;PGM; M uncle  . Stroke Paternal Aunt        aneurysm  . Colon cancer Paternal Aunt        X 2  . Other Mother        lung tumor which resolved w/o treatment  . Hypertension Mother   . Other Maternal Grandmother        enalraged heart  . Hypertension Maternal Grandmother   .  Heart attack Maternal Grandfather        In fifties, died.  . Hypertension Maternal Grandfather   . Heart attack Paternal Grandmother        In forties.  . Hypertension Paternal Grandmother   . Heart attack Paternal Grandfather   . Heart disease Paternal Grandfather   . Hypertension Paternal Grandfather   . Hypertension Daughter   . Hypertension Son   . Aortic dissection Sister        descending  . Diabetes Neg Hx     SOCIAL HISTORY: Social History   Socioeconomic History  . Marital status: Married    Spouse name: Not on file  . Number of children: 2  . Years of education: Not on file  . Highest education level: Not on file  Occupational History  . Not on file  Social Needs  . Financial resource strain: Not on file  . Food insecurity:    Worry: Not on file    Inability: Not on file  . Transportation needs:    Medical: Not on file    Non-medical: Not on file  Tobacco Use  . Smoking status: Never Smoker  . Smokeless tobacco: Never Used  Substance and Sexual Activity  . Alcohol use: Yes    Comment: rarely  . Drug use: No  . Sexual activity: Yes  Lifestyle  . Physical activity:    Days per week: Not on file    Minutes per session: Not on file  . Stress: Not on file  Relationships  . Social connections:    Talks on phone: Not on file    Gets together: Not on file    Attends religious service:  Not on file    Active member of club or organization: Not on file    Attends meetings of clubs or organizations: Not on file    Relationship status: Not on file  . Intimate partner violence:    Fear of current or ex partner: Not on file    Emotionally abused: Not on file    Physically abused: Not on file    Forced sexual activity: Not on file  Other Topics Concern  . Not on file  Social History Narrative   Reg exercise 2 x weekly with trainer, no dietary restrictions. Trying to eat heart healthy.       Retired from [employer].    REVIEW OF SYSTEMS: Constitutional: No fevers, chills, or sweats, no generalized fatigue, change in appetite Eyes: No visual changes, double vision, eye pain Ear, nose and throat: No hearing loss, ear pain, nasal congestion, sore throat Cardiovascular: No chest pain, palpitations Respiratory:  No shortness of breath at rest or with exertion, wheezes GastrointestinaI: No nausea, vomiting, diarrhea, abdominal pain, fecal incontinence Genitourinary:  No dysuria, urinary retention or frequency Musculoskeletal:  No neck pain, back pain Integumentary: No rash, pruritus, skin lesions Neurological: as above Psychiatric: No depression, insomnia, anxiety Endocrine: No palpitations, fatigue, diaphoresis, mood swings, change in appetite, change in weight, increased thirst Hematologic/Lymphatic:  No anemia, purpura, petechiae. Allergic/Immunologic: no itchy/runny eyes, nasal congestion, recent allergic reactions, rashes  PHYSICAL EXAM: Vitals:   12/08/17 1017  BP: 118/86  Pulse: (!) 102  SpO2: 97%   General: No acute distress Head:  Normocephalic/atraumatic Neck: supple, no paraspinal tenderness, full range of motion Heart:  Regular rate and rhythm Lungs:  Clear to auscultation bilaterally Back: No paraspinal tenderness Skin/Extremities: No rash, no edema Neurological Exam: alert and oriented to person, place, and time. No  aphasia or dysarthria. Fund of  knowledge is appropriate.  Recent and remote memory are intact.  Attention and concentration are normal.    Able to name objects and repeat phrases.  MMSE - Mini Mental State Exam 12/08/2017 01/31/2017  Orientation to time 5 5  Orientation to Place 5 5  Registration 3 3  Attention/ Calculation 5 5  Recall 3 3  Language- name 2 objects 2 2  Language- repeat 1 1  Language- follow 3 step command 3 3  Language- read & follow direction 1 1  Write a sentence 1 1  Copy design 1 1  Total score 30 30   Cranial nerves: Pupils equal, round, reactive to light.  Extraocular movements intact with no nystagmus. Visual fields full. Facial sensation intact. No facial asymmetry. Tongue, uvula, palate midline.  Motor: Bulk and tone normal, muscle strength 5/5 throughout with no pronator drift.  Sensation to light touch intact.  No extinction to double simultaneous stimulation.  Deep tendon reflexes +1 throughout, toes downgoing.  Finger to nose testing intact.  Gait narrow-based and steady, able to tandem walk adequately.  Romberg negative.  IMPRESSION: This is a pleasant 72 yo RH woman with a history of  hypertension, hyperlipidemia, fibromyalgia, who had an episode of loss of consciousness last April 11 or 72, 2018. She reports a history of syncope but these were usually preceded by presyncopal symptoms, however the recent event occurred without warning, she woke up on the floor with unknown duration of loss of consciousness. She has no clear epilepsy risk factors, neurological exam non-focal. Her MRI brain and 1-hour EEG were normal. She has had a cardiac workup as well with echo showing severe proximal septal hypertrophy, severe LVH, her 21-day holter did not show any arrhythmia. Episode likely syncope, no clear evidence of seizure. She was also concerned about memory, MMSE today normal 30/30. No indication to start cholinesterase inhibitors at this time. We discussed the importance of control of vascular risk  factors, physical exercise, and brain stimulation exercises for brain health. She will follow-up in a year and knows to call for any changes.   Thank you for allowing me to participate in her care.  Please do not hesitate to call for any questions or concerns.  The duration of this appointment visit was 24 minutes of face-to-face time with the patient.  Greater than 50% of this time was spent in counseling, explanation of diagnosis, planning of further management, and coordination of care.   Ellouise Newer, M.D.   CC: Dr. Charlett Blake

## 2017-12-19 ENCOUNTER — Encounter: Payer: Self-pay | Admitting: Neurology

## 2017-12-24 NOTE — Progress Notes (Signed)
Cardiology Office Note   Date:  12/25/2017   ID:  Madeline [name], DOB [birth year], MRN [id number]  PCP:  Mosie Lukes, MD  Cardiologist:   Minus Breeding, MD  Referring:  Mosie Lukes, MD  Chief Complaint  Patient presents with  . Cardiomyopathy      History of Present Illness: Madeline [name] is a 72 y.o. female who presents for evaluation of dyspnea.  She has had a POET (Plain Old Exercise Treadmill) which was negative for evidence of ischemia.   She did have a past history of syncope and a slightly abnormal EKG.  I had her wear a monitor that demonstrated no arrhythmias.  Echo demonstrated significant LVH on echo with septal hypertrophy. MRI confirmed severe basal septal hypertrophy without uptake of gad.   At the last visit she had dyspnea and she I switched her from Cardizem to verapamil.   Unfortunately the verapamil caused constipation.  She did not see any improvement in her dyspnea.  She is short of breath doing moderate activities such as walking a moderate distance on level ground.  She is not having any PND or orthopnea.  She is had no further syncopal episodes.  She will occasionally get a little dizzy and lightheaded but she is not feeling any palpitations or presyncope.  She is had no chest pressure, neck or arm discomfort.    Past Medical History:  Diagnosis Date  . Anxiety   . Arthritis 09-14-11   osteoarthritis, osteopenia  . Cataracts, both eyes 09-13-11   not surgical yet  . Cholelithiasis 05/17/2016  . Chronic neck pain 05/29/2016  . Cystitis 2007   sepsis post bladder biopsy, Dr Jeffie Pollock  . Fibromyalgia 09-14-11   Dr Donney Dice, [facility]  . Fractures 09-14-11   toes-left foot  . GERD (gastroesophageal reflux disease) 09-14-11   tx. Nexium  . H/O foot surgery 12/19/2011  . Hiatal hernia 05/17/2016  . Hyperglycemia 08/23/2016  . Hyperlipidemia   . Hypertension 09-13-11   tx. meds  . 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  Left ventricular hypertrophy 05/15/2017  . Muscle cramping 10/23/2017  . SOB (shortness of breath) 02/17/2017  . TMJ click 27-07-86   right side > left  . 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 ABDOMINAL HYSTERECTOMY  1977   dysfunctional menses; age 13  . APPENDECTOMY  1963  . COLONOSCOPY  2011   negative  . CYSTOSTOMY W/ BLADDER BIOPSY  04-2006  . FOOT ARTHRODESIS  01/03/12   [facility]-- left hallux MP joint arthrodesis  . HAMMER TOE SURGERY  01/03/12   [facility], Dr Doran Durand  . NASAL SINUS SURGERY  09-14-11   right side " sinus lift"  . SIGMOIDOSCOPY    . sling procedure  2006   a and p repair  . TONSILLECTOMY    . TOTAL KNEE ARTHROPLASTY  09/21/2011   Procedure: TOTAL KNEE ARTHROPLASTY;  Surgeon: Gearlean Alf;  Location: [facility];  Service: Orthopedics;  Laterality: Left;  . TOTAL KNEE ARTHROPLASTY  06/25/2012   Procedure: TOTAL KNEE ARTHROPLASTY;  Surgeon: Gearlean Alf, MD;  Location: [facility];  Service: Orthopedics;  Laterality: Right;     Current Outpatient Medications  Medication Sig Dispense Refill  . aspirin EC 81 MG tablet Take 81 mg by mouth daily.    Marland Kitchen CALCIUM  PO Take 600 mg by mouth 2 (two) times daily.     . Cholecalciferol (VITAMIN D3) 2000 UNITS TABS Take 2,000 Units by mouth daily.     . cyclobenzaprine (FLEXERIL) 10 MG tablet Take 20 mg by mouth at bedtime.    . diclofenac sodium (VOLTAREN) 1 % GEL Apply 1 application topically as needed.    . DULoxetine (CYMBALTA) 60 MG capsule Take 60 mg by mouth daily.    . fosinopril (MONOPRIL) 10 MG tablet Take 5 mg by mouth daily.    . Multiple Vitamin (MULTI-DAY PO) Take by mouth daily.    Marland Kitchen oxyCODONE (OXY IR/ROXICODONE) 5 MG immediate release tablet Take 1 tablet by mouth as needed.    . pravastatin (PRAVACHOL) 40 MG tablet Take 1 tablet (40 mg total) by mouth at bedtime. 90 tablet 1  . ranitidine (ZANTAC) 150 MG capsule  Take 150 mg by mouth 2 (two) times daily.     . tizanidine (ZANAFLEX) 2 MG capsule Take 4 mg by mouth at bedtime.    . traMADol (ULTRAM) 50 MG tablet Take 2 tablets by mouth every 6 (six) hours as needed. Not to exceed 6 tablets a day     No current facility-administered medications for this visit.     Allergies:   Ivp dye [iodinated diagnostic agents] and Codeine    ROS:  Please see the history of present illness.   Otherwise, review of systems are positive for none.   All other systems are reviewed and negative.    PHYSICAL EXAM: VS:  BP 102/78   Pulse (!) 104   Ht 5\' 8"  (1.727 m)   Wt 190 lb 3.2 oz (86.3 kg)   BMI 28.92 kg/m  , BMI Body mass index is 28.92 kg/m.  GENERAL:  Well appearing NECK:  No jugular venous distention, waveform within normal limits, carotid upstroke brisk and symmetric, no bruits, no thyromegaly LUNGS:  Clear to auscultation bilaterally CHEST:  Unremarkable HEART:  PMI not displaced or sustained,S1 and S2 within normal limits, no S3, no S4, no clicks, no rubs, no murmurs ABD:  Flat, positive bowel sounds normal in frequency in pitch, no bruits, no rebound, no guarding, no midline pulsatile mass, no hepatomegaly, no splenomegaly EXT:  2 plus pulses throughout, no edema, no cyanosis no clubbing   EKG:  EKG is not ordered today.    Recent Labs: 01/16/2017: Pro B Natriuretic peptide (BNP) 15.0 10/23/2017: ALT 11; BUN 12; Creatinine, Ser 0.91; Hemoglobin 13.0; Magnesium 2.3; Platelets 259.0; Potassium 4.7; Sodium 138; TSH 3.50    Lipid Panel    Component Value Date/Time   CHOL 169 10/23/2017 1058   TRIG 106.0 10/23/2017 1058   HDL 80.70 10/23/2017 1058   CHOLHDL 2 10/23/2017 1058   VLDL 21.2 10/23/2017 1058   LDLCALC 67 10/23/2017 1058      Wt Readings from Last 3 Encounters:  12/25/17 190 lb 3.2 oz (86.3 kg)  12/08/17 185 lb (83.9 kg)  10/27/17 190 lb 9.6 oz (86.5 kg)      Other studies Reviewed: Additional studies/ records that were  reviewed today include:  Labs, echo, MRI Review of the above records demonstrates:      ASSESSMENT AND PLAN:  SEPTAL HYPERTROPHY:    She has no high risk findings and she is had a treadmill, monitor and now the MRI.  I changed her from Cardizem to verapamil.  However, she did not have improvement in dyspnea.  She got constipated.  She is actually  had some low blood pressures and lightheadedness so I am simply can stop the verapamil.  She does not have a murmur to suggest a dynamic gradient.  She did not have outflow obstruction on her echo.  Perhaps she will do better without calcium channel blocker at all.  At this point I am not completely convinced that her dyspnea is related to any volume or hypertrophy issues.  Previous BNP with shortness of breath was 15.  I might have to evaluate other etiologies.   DYSPNEA:   Again I will treat this now by discontinuing her calcium channel altogether and see how she does symptomatically and considering further evaluation  QT:   Previous QT on the most recent EKG was not prolonged.  No change in therapy.   SYNCOPE:   She is had no further events.   Current medicines are reviewed at length with the patient today.  The patient does not have concerns regarding medicines.  The following changes have been made:  As above.   Labs/ tests ordered today include:   None  No orders of the defined types were placed in this encounter.    Disposition:   FU with me 1 month office.   Signed, Minus Breeding, MD  12/25/2017 7:48 PM    [facility]

## 2017-12-25 ENCOUNTER — Ambulatory Visit: Payer: Medicare Other | Admitting: Cardiology

## 2017-12-25 ENCOUNTER — Encounter: Payer: Self-pay | Admitting: Cardiology

## 2017-12-25 VITALS — BP 102/78 | HR 104 | Ht 68.0 in | Wt 190.2 lb

## 2017-12-25 DIAGNOSIS — I422 Other hypertrophic cardiomyopathy: Secondary | ICD-10-CM

## 2017-12-25 DIAGNOSIS — R55 Syncope and collapse: Secondary | ICD-10-CM | POA: Diagnosis not present

## 2017-12-25 DIAGNOSIS — R0600 Dyspnea, unspecified: Secondary | ICD-10-CM | POA: Diagnosis not present

## 2017-12-25 NOTE — Patient Instructions (Signed)
Medication Instructions:  STOP- Verapamil  If you need a refill on your cardiac medications before your next appointment, please call your pharmacy.  Labwork: None Ordered  Testing/Procedures: None Ordered  Follow-Up: Your physician wants you to follow-up in: 1 Month.     Thank you for choosing [facility] at [facility]!!

## 2018-01-09 ENCOUNTER — Other Ambulatory Visit: Payer: Self-pay | Admitting: Family Medicine

## 2018-01-25 ENCOUNTER — Encounter: Payer: Self-pay | Admitting: Cardiology

## 2018-01-25 NOTE — Progress Notes (Signed)
Cardiology Office Note   Date:  01/26/2018   ID:  Madeline [name], DOB 01-24-1946, MRN [id number]  PCP:  Mosie Lukes, MD  Cardiologist:   Minus Breeding, MD  Referring:  Mosie Lukes, MD  Chief Complaint  Patient presents with  . Shortness of Breath      History of Present Illness: Madeline [name] is a 72 y.o. female who presents for evaluation of dyspnea.  She has had a POET (Plain Old Exercise Treadmill) which was negative for evidence of ischemia.   She did have a past history of syncope and a slightly abnormal EKG.  I had her wear a monitor that demonstrated no arrhythmias.  Echo demonstrated significant LVH on echo with septal hypertrophy. MRI confirmed severe basal septal hypertrophy without uptake of gad.   At the last visit she had dyspnea and she I switched her from Cardizem to verapamil.   Unfortunately the verapamil caused constipation.  She did not see any improvement in her dyspnea.  At the last visit I stopped the calcium channel blocker.    She has been relatively sedentary and just reading a lot and not doing her spring chores since I last saw her.  Her blood pressures have been labile.  When her blood pressure goes up with activities and in the morning her heart rate might also be elevated.  She gets short of like brushing her hair.  She is not noticing any presyncope or syncope.  She is not having any chest pressure, neck or arm discomfort.  She is had no new edema or weight gain.  Constipation has improved.    Past Medical History:  Diagnosis Date  . Anxiety   . Arthritis 09-14-11   osteoarthritis, osteopenia  . Cataracts, both eyes 09-13-11   not surgical yet  . Cholelithiasis 05/17/2016  . Chronic neck pain 05/29/2016  . Cystitis 2007   sepsis post bladder biopsy, Dr Jeffie Pollock  . Fibromyalgia 09-14-11   Dr Donney Dice, [facility]  . Fractures 09-14-11   toes-left foot  . GERD (gastroesophageal reflux disease) 09-14-11   tx. Nexium  . H/O foot  surgery 12/19/2011  . Hiatal hernia 05/17/2016  . Hyperglycemia 08/23/2016  . Hyperlipidemia   . Hypertension 09-13-11   tx. meds  . 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 Left ventricular hypertrophy 05/15/2017  . Muscle cramping 10/23/2017  . TMJ click 34-19-62   right side > left  . 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 ABDOMINAL HYSTERECTOMY  1977   dysfunctional menses; age 55  . APPENDECTOMY  1963  . COLONOSCOPY  2011   negative  . CYSTOSTOMY W/ BLADDER BIOPSY  04-2006  . FOOT ARTHRODESIS  01/03/12   [facility]-- left hallux MP joint arthrodesis  . HAMMER TOE SURGERY  01/03/12   [facility], Dr Doran Durand  . NASAL SINUS SURGERY  09-14-11   right side " sinus lift"  . SIGMOIDOSCOPY    . sling procedure  2006   a and p repair  . TONSILLECTOMY    . TOTAL KNEE ARTHROPLASTY  09/21/2011   Procedure: TOTAL KNEE ARTHROPLASTY;  Surgeon: Gearlean Alf;  Location: [facility];  Service: Orthopedics;  Laterality: Left;  . TOTAL KNEE ARTHROPLASTY  06/25/2012   Procedure: TOTAL KNEE ARTHROPLASTY;  Surgeon: Gearlean Alf, MD;  Location: [facility];  Service: Orthopedics;  Laterality: Right;     Current Outpatient Medications  Medication Sig Dispense Refill  . aspirin EC 81 MG tablet Take 81 mg by mouth daily.    Marland Kitchen CALCIUM PO Take 600 mg by mouth 2 (two) times daily.     . Cholecalciferol (VITAMIN D3) 2000 UNITS TABS Take 2,000 Units by mouth daily.     . cyclobenzaprine (FLEXERIL) 10 MG tablet Take 20 mg by mouth at bedtime.    . diclofenac sodium (VOLTAREN) 1 % GEL Apply 1 application topically as needed.    . DULoxetine (CYMBALTA) 60 MG capsule Take 60 mg by mouth daily.    . fosinopril (MONOPRIL) 10 MG tablet Take 5 mg by mouth daily.    . Multiple Vitamin (MULTI-DAY PO) Take by mouth daily.    Marland Kitchen oxyCODONE (OXY IR/ROXICODONE) 5 MG immediate release tablet Take 1 tablet by  mouth as needed.    . pravastatin (PRAVACHOL) 40 MG tablet Take 1 tablet (40 mg total) by mouth at bedtime. 90 tablet 1  . ranitidine (ZANTAC) 150 MG capsule Take 150 mg by mouth 2 (two) times daily.     . tizanidine (ZANAFLEX) 2 MG capsule Take 4 mg by mouth at bedtime.    . traMADol (ULTRAM) 50 MG tablet Take 2 tablets by mouth every 6 (six) hours as needed. Not to exceed 6 tablets a day    . metoprolol succinate (TOPROL-XL) 50 MG 24 hr tablet Take 1 tablet (50 mg total) by mouth at bedtime. Take with or immediately following a meal. 90 tablet 3   No current facility-administered medications for this visit.     Allergies:   Ivp dye [iodinated diagnostic agents] and Codeine    ROS:  Please see the history of present illness.   Otherwise, review of systems are positive for none.   All other systems are reviewed and negative.    PHYSICAL EXAM: VS:  BP (!) 154/100   Pulse 84   Ht 5\' 8"  (1.727 m)   Wt 191 lb 12.8 oz (87 kg)   SpO2 97%   BMI 29.16 kg/m  , BMI Body mass index is 29.16 kg/m.  GENERAL:  Well appearing NECK:  No jugular venous distention, waveform within normal limits, carotid upstroke brisk and symmetric, no bruits, no thyromegaly LUNGS:  Clear to auscultation bilaterally CHEST:  Unremarkable HEART:  PMI not displaced or sustained,S1 and S2 within normal limits, no S3, no S4, no clicks, no rubs, no murmurs ABD:  Flat, positive bowel sounds normal in frequency in pitch, no bruits, no rebound, no guarding, no midline pulsatile mass, no hepatomegaly, no splenomegaly EXT:  2 plus pulses throughout, no edema, no cyanosis no clubbing   EKG:  EKG is not ordered today.    Recent Labs: 10/23/2017: ALT 11; BUN 12; Creatinine, Ser 0.91; Hemoglobin 13.0; Magnesium 2.3; Platelets 259.0; Potassium 4.7; Sodium 138; TSH 3.50    Lipid Panel    Component Value Date/Time   CHOL 169 10/23/2017 1058   TRIG 106.0 10/23/2017 1058   HDL 80.70 10/23/2017 1058   CHOLHDL 2 10/23/2017  1058   VLDL 21.2 10/23/2017 1058   LDLCALC 67 10/23/2017 1058      Wt Readings from Last 3 Encounters:  01/26/18 191 lb 12.8 oz (87 kg)  12/25/17 190 lb 3.2 oz (86.3 kg)  12/08/17 185 lb (83.9 kg)      Other studies Reviewed: Additional studies/ records that were reviewed today include:  None Review of  the above records demonstrates:      ASSESSMENT AND PLAN:  SEPTAL HYPERTROPHY:    She has no high risk findings and she is had a treadmill, monitor and now the MRI.  I changed her from Cardizem to verapamil.  However, she did not have improvement in dyspnea.  She got constipated.  I stopped the Verapamil.    I reviewed the blood pressure diary and heart rates.  I am going to try Toprol-XL 50 mg with the evening meal to see how symptomatically this might help her dyspnea, blood pressure and heart rate.  DYSPNEA:   As above.  QT:    QT was not prolonged and the most recent EKG.  I will follow this with EKGs in the future.  She understands to avoid QT prolonging drugs.  SYNCOPE: She has had no further episodes.  She is had no high risk findings on the extensive work-up above.  Current medicines are reviewed at length with the patient today.  The patient does not have concerns regarding medicines.  The following changes have been made:  As above  Labs/ tests ordered today include:     No orders of the defined types were placed in this encounter.    Disposition:   FU with me in two weeks.   Signed, Minus Breeding, MD  01/26/2018 12:42 PM    [facility]

## 2018-01-26 ENCOUNTER — Encounter: Payer: Self-pay | Admitting: Cardiology

## 2018-01-26 ENCOUNTER — Ambulatory Visit: Payer: Medicare Other | Admitting: Cardiology

## 2018-01-26 VITALS — BP 154/100 | HR 84 | Ht 68.0 in | Wt 191.8 lb

## 2018-01-26 DIAGNOSIS — R0602 Shortness of breath: Secondary | ICD-10-CM

## 2018-01-26 DIAGNOSIS — I422 Other hypertrophic cardiomyopathy: Secondary | ICD-10-CM

## 2018-01-26 DIAGNOSIS — I10 Essential (primary) hypertension: Secondary | ICD-10-CM | POA: Diagnosis not present

## 2018-01-26 MED ORDER — METOPROLOL SUCCINATE ER 50 MG PO TB24: 50.0000 mg | ORAL_TABLET | Freq: Every day | ORAL | 3 refills | Status: DC

## 2018-01-26 NOTE — Patient Instructions (Signed)
Medication Instructions:  START- Metoprolol Succinate 50 mg 1 tablet at bedtime  If you need a refill on your cardiac medications before your next appointment, please call your pharmacy.  Labwork: None Ordered   Testing/Procedures: None Ordered  Follow-Up: Your physician wants you to follow-up in: 2 Weeks.     Thank you for choosing [facility] at [facility]!!

## 2018-02-15 NOTE — Progress Notes (Signed)
Cardiology Office Note   Date:  02/16/2018   ID:  Madeline [name], DOB May 28, 1946, MRN [id number]  PCP:  Mosie Lukes, MD  Cardiologist:   Minus Breeding, MD  Referring:  Mosie Lukes, MD  Chief Complaint  Patient presents with  . Shortness of Breath      History of Present Illness: Madeline [name] is a 72 y.o. female who presents for evaluation of dyspnea.  She has had a POET (Plain Old Exercise Treadmill) which was negative for evidence of ischemia.   She did have a past history of syncope and a slightly abnormal EKG.  I had her wear a monitor that demonstrated no arrhythmias.  Echo demonstrated significant LVH on echo with septal hypertrophy. MRI confirmed severe basal septal hypertrophy without uptake of gad.   At the last visit she had dyspnea and she I switched her from Cardizem to verapamil.   Unfortunately the verapamil caused constipation.  She did not see any improvement in her dyspnea.  At a previous visit I stopped the calcium channel blocker.  At the last visit I started Toprol XL.    The patient's been feeling relatively well with the change to beta-blocker.  She brings a detailed blood pressure diary and is taking it 5 times a day.  For the most part the systolic blood pressures are elevated though there is some variability.  There are a couple of very low readings but typically at about 315 systolic and sometimes in the 170s.  There is not a particular trend between morning and evening.  She is taking her metoprolol at night.  She is not having any presyncope or syncope.  She is having no palpitations.  She is not short of breath that she was.  However, she is not as active as she was eating.  She has not had any further syncope.  She did not have any resting shortness of breath, PND or orthopnea.   Past Medical History:  Diagnosis Date  . Anxiety   . Arthritis 09-14-11   osteoarthritis, osteopenia  . Cataracts, both eyes 09-13-11   not surgical yet  .  Cholelithiasis 05/17/2016  . Chronic neck pain 05/29/2016  . Cystitis 2007   sepsis post bladder biopsy, Dr Jeffie Pollock  . Fibromyalgia 09-14-11   Dr Donney Dice, [facility]  . Fractures 09-14-11   toes-left foot  . GERD (gastroesophageal reflux disease) 09-14-11   tx. Nexium  . H/O foot surgery 12/19/2011  . Hiatal hernia 05/17/2016  . Hyperglycemia 08/23/2016  . Hyperlipidemia   . Hypertension 09-13-11   tx. meds  . 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 Left ventricular hypertrophy 05/15/2017  . Muscle cramping 10/23/2017  . TMJ click 17-61-60   right side > left  . 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 ABDOMINAL HYSTERECTOMY  1977   dysfunctional menses; age 71  . APPENDECTOMY  1963  . COLONOSCOPY  2011   negative  . CYSTOSTOMY W/ BLADDER BIOPSY  04-2006  . FOOT ARTHRODESIS  01/03/12   [facility]-- left hallux MP joint arthrodesis  . HAMMER TOE SURGERY  01/03/12   [facility], Dr Doran Durand  . NASAL SINUS SURGERY  09-14-11   right side " sinus lift"  . SIGMOIDOSCOPY    . sling procedure  2006   a and p repair  .  TONSILLECTOMY    . TOTAL KNEE ARTHROPLASTY  09/21/2011   Procedure: TOTAL KNEE ARTHROPLASTY;  Surgeon: Gearlean Alf;  Location: [facility];  Service: Orthopedics;  Laterality: Left;  . TOTAL KNEE ARTHROPLASTY  06/25/2012   Procedure: TOTAL KNEE ARTHROPLASTY;  Surgeon: Gearlean Alf, MD;  Location: [facility];  Service: Orthopedics;  Laterality: Right;     Current Outpatient Medications  Medication Sig Dispense Refill  . aspirin EC 81 MG tablet Take 81 mg by mouth daily.    Marland Kitchen CALCIUM PO Take 600 mg by mouth 2 (two) times daily.     . Cholecalciferol (VITAMIN D3) 2000 UNITS TABS Take 2,000 Units by mouth daily.     . cyclobenzaprine (FLEXERIL) 10 MG tablet Take 20 mg by mouth at bedtime.    . diclofenac sodium (VOLTAREN) 1 % GEL Apply 1 application  topically as needed.    . DULoxetine (CYMBALTA) 60 MG capsule Take 60 mg by mouth daily.    . fosinopril (MONOPRIL) 10 MG tablet Take 5 mg by mouth daily.    . metoprolol succinate (TOPROL-XL) 50 MG 24 hr tablet Take 1 tablet (50 mg total) by mouth at bedtime. Take with or immediately following a meal. 90 tablet 3  . Multiple Vitamin (MULTI-DAY PO) Take by mouth daily.    Marland Kitchen oxyCODONE (OXY IR/ROXICODONE) 5 MG immediate release tablet Take 1 tablet by mouth as needed.    . pravastatin (PRAVACHOL) 40 MG tablet Take 1 tablet (40 mg total) by mouth at bedtime. 90 tablet 1  . ranitidine (ZANTAC) 150 MG capsule Take 150 mg by mouth 2 (two) times daily.     . tizanidine (ZANAFLEX) 2 MG capsule Take 4 mg by mouth at bedtime.    . traMADol (ULTRAM) 50 MG tablet Take 2 tablets by mouth every 6 (six) hours as needed. Not to exceed 6 tablets a day    . metoprolol succinate (TOPROL XL) 25 MG 24 hr tablet Take 1 tablet (25 mg total) by mouth every morning. 90 tablet 3   No current facility-administered medications for this visit.     Allergies:   Ivp dye [iodinated diagnostic agents] and Codeine    ROS:  Please see the history of present illness.   Otherwise, review of systems are positive for none.   All other systems are reviewed and negative.    PHYSICAL EXAM: VS:  BP (!) 142/92   Pulse 92   Ht 5\' 8"  (1.727 m)   Wt 194 lb 6.4 oz (88.2 kg)   BMI 29.56 kg/m  , BMI Body mass index is 29.56 kg/m.  GENERAL:  Well appearing NECK:  No jugular venous distention, waveform within normal limits, carotid upstroke brisk and symmetric, no bruits, no thyromegaly LUNGS:  Clear to auscultation bilaterally CHEST:  Unremarkable HEART:  PMI not displaced or sustained,S1 and S2 within normal limits, no S3, no S4, no clicks, no rubs, no murmurs ABD:  Flat, positive bowel sounds normal in frequency in pitch, no bruits, no rebound, no guarding, no midline pulsatile mass, no hepatomegaly, no splenomegaly EXT:  2 plus  pulses throughout, no edema, no cyanosis no clubbing   EKG:  EKG is not ordered today.    Recent Labs: 10/23/2017: ALT 11; BUN 12; Creatinine, Ser 0.91; Hemoglobin 13.0; Magnesium 2.3; Platelets 259.0; Potassium 4.7; Sodium 138; TSH 3.50    Lipid Panel    Component Value Date/Time   CHOL 169 10/23/2017 1058   TRIG 106.0 10/23/2017 1058  HDL 80.70 10/23/2017 1058   CHOLHDL 2 10/23/2017 1058   VLDL 21.2 10/23/2017 1058   LDLCALC 67 10/23/2017 1058      Wt Readings from Last 3 Encounters:  02/16/18 194 lb 6.4 oz (88.2 kg)  01/26/18 191 lb 12.8 oz (87 kg)  12/25/17 190 lb 3.2 oz (86.3 kg)      Other studies Reviewed: Additional studies/ records that were reviewed today include:  None Review of the above records demonstrates:      ASSESSMENT AND PLAN:  SEPTAL HYPERTROPHY:    She has no high risk findings and she is had a treadmill, monitor and now the MRI.  I am managing this medically.  See below.   DYSPNEA:     I will increase her beta blocker slightly.  She will take Toprol-XL 25 mg in the morning in addition  QT:    QT was not prolonged and the most recent EKG.     SYNCOPE:   She has had no further episodes.   No further work up.    Current medicines are reviewed at length with the patient today.  The patient does not have concerns regarding medicines.  The following changes have been made: As above.   Labs/ tests ordered today include:   None No orders of the defined types were placed in this encounter.    Disposition:   FU with me in 2 months.  Signed, Minus Breeding, MD  02/16/2018 3:44 PM    [place]

## 2018-02-15 NOTE — Progress Notes (Addendum)
Subjective:   Madeline [name] is a 72 y.o. female who presents for Medicare Annual (Subsequent) preventive examination.  Review of Systems: No ROS.  Medicare Wellness Visit. Additional risk factors are reflected in the social history. Cardiac Risk Factors include: advanced age (>52men, >30 women);dyslipidemia;hypertension;sedentary lifestyle Sleep patterns: Takes Zananflex and flexiril at bedtime. Sleeps 7-8 hrs. Home Safety/Smoke Alarms: Feels safe in home. Smoke alarms in place.  Living environment; residence and Firearm Safety: Lives with husband in 2 story home.   Female:        Mammo-  ordered     Dexa scan-utd        CCS-would like to discuss with PCP as today's appointment.    Objective:     Vitals: BP 126/78 (BP Location: Left Arm, Patient Position: Sitting, Cuff Size: Normal)   Pulse (!) 57   Ht 5\' 8"  (1.727 m)   Wt 194 lb 6.4 oz (88.2 kg)   SpO2 98%   BMI 29.56 kg/m   Body mass index is 29.56 kg/m.  Advanced Directives 02/22/2018 02/17/2017 01/17/2017 01/17/2017 08/23/2016 07/18/2016 06/25/2015  Does Patient Have a Medical Advance Directive? Yes Yes - No;Yes No No Yes  Type of Paramedic of [place];Living will [place];Living will - [place];Living will - - Living will  Does patient want to make changes to medical advance directive? - No - Patient declined - No - Patient declined - - -  Copy of [place] in Chart? No - copy requested No - copy requested No - copy requested - - - -  Would patient like information on creating a medical advance directive? - - No - Patient declined No - Patient declined No - Patient declined No - patient declined information -  Pre-existing out of facility DNR order (yellow form or pink MOST form) - - - - - - -    Tobacco Social History   Tobacco Use  Smoking Status Never Smoker  Smokeless Tobacco Never Used     Counseling given: Not Answered   Clinical  Intake: Pain : No/denies pain    Past Medical History:  Diagnosis Date  . Anxiety   . Arthritis 09-14-11   osteoarthritis, osteopenia  . Cataracts, both eyes 09-13-11   not surgical yet  . Cholelithiasis 05/17/2016  . Chronic neck pain 05/29/2016  . Cystitis 2007   sepsis post bladder biopsy, Dr Jeffie Pollock  . Fibromyalgia 09-14-11   Dr Donney Dice, [facility]  . Fractures 09-14-11   toes-left foot  . GERD (gastroesophageal reflux disease) 09-14-11   tx. Nexium  . H/O foot surgery 12/19/2011  . Hiatal hernia 05/17/2016  . Hyperglycemia 08/23/2016  . Hyperlipidemia   . Hypertension 09-13-11   tx. meds  . 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 Left ventricular hypertrophy 05/15/2017  . Muscle cramping 10/23/2017  . TMJ click [id number]   right side > left  . 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 ABDOMINAL HYSTERECTOMY  1977   dysfunctional menses; age 49  . APPENDECTOMY  1963  . COLONOSCOPY  2011   negative  . CYSTOSTOMY W/ BLADDER BIOPSY  04-2006  . FOOT ARTHRODESIS  01/03/12   [facility]-- left hallux MP joint arthrodesis  . HAMMER TOE SURGERY  01/03/12   [facility], Dr Doran Durand  . NASAL SINUS  SURGERY  09-14-11   right side " sinus lift"  . SIGMOIDOSCOPY    . sling procedure  2006   a and p repair  . TONSILLECTOMY    . TOTAL KNEE ARTHROPLASTY  09/21/2011   Procedure: TOTAL KNEE ARTHROPLASTY;  Surgeon: Gearlean Alf;  Location: [facility];  Service: Orthopedics;  Laterality: Left;  . TOTAL KNEE ARTHROPLASTY  06/25/2012   Procedure: TOTAL KNEE ARTHROPLASTY;  Surgeon: Gearlean Alf, MD;  Location: [facility];  Service: Orthopedics;  Laterality: Right;   Family History  Problem Relation Age of Onset  . Aortic aneurysm Father        femoral aneurysm  . Mental illness Brother        paranoid schizophrenia  . Pneumonia Brother        pneumothorax in [place]  .  Heart attack Unknown        both GF;PGM; M uncle  . Stroke Paternal Aunt        aneurysm  . Colon cancer Paternal Aunt        X 2  . Other Mother        lung tumor which resolved w/o treatment  . Hypertension Mother   . Other Maternal Grandmother        enalraged heart  . Hypertension Maternal Grandmother   . Heart attack Maternal Grandfather        In fifties, died.  . Hypertension Maternal Grandfather   . Heart attack Paternal Grandmother        In forties.  . Hypertension Paternal Grandmother   . Heart attack Paternal Grandfather   . Heart disease Paternal Grandfather   . Hypertension Paternal Grandfather   . Hypertension Daughter   . Hypertension Son   . Aortic dissection Sister        descending  . Diabetes Neg Hx    Social History   Socioeconomic History  . Marital status: Married    Spouse name: Not on file  . Number of children: 2  . Years of education: Not on file  . Highest education level: Not on file  Occupational History  . Not on file  Social Needs  . Financial resource strain: Not on file  . Food insecurity:    Worry: Not on file    Inability: Not on file  . Transportation needs:    Medical: Not on file    Non-medical: Not on file  Tobacco Use  . Smoking status: Never Smoker  . Smokeless tobacco: Never Used  Substance and Sexual Activity  . Alcohol use: Yes    Comment: rarely  . Drug use: No  . Sexual activity: Not Currently  Lifestyle  . Physical activity:    Days per week: Not on file    Minutes per session: Not on file  . Stress: Not on file  Relationships  . Social connections:    Talks on phone: Not on file    Gets together: Not on file    Attends religious service: Not on file    Active member of club or organization: Not on file    Attends meetings of clubs or organizations: Not on file    Relationship status: Not on file  Other Topics Concern  . Not on file  Social History Narrative   Reg exercise 2 x weekly with trainer, no  dietary restrictions. Trying to eat heart healthy.       Retired from [employer].    Outpatient Encounter Medications as  of 02/22/2018  Medication Sig  . aspirin EC 81 MG tablet Take 81 mg by mouth daily.  Marland Kitchen CALCIUM PO Take 600 mg by mouth 2 (two) times daily.   . Cholecalciferol (VITAMIN D3) 2000 UNITS TABS Take 2,000 Units by mouth daily.   . cyclobenzaprine (FLEXERIL) 10 MG tablet Take 20 mg by mouth at bedtime.  . diclofenac sodium (VOLTAREN) 1 % GEL Apply 1 application topically as needed.  . DULoxetine (CYMBALTA) 60 MG capsule Take 60 mg by mouth daily.  . fosinopril (MONOPRIL) 10 MG tablet Take 5 mg by mouth daily.  . metoprolol succinate (TOPROL XL) 25 MG 24 hr tablet Take 1 tablet (25 mg total) by mouth every morning.  . metoprolol succinate (TOPROL-XL) 50 MG 24 hr tablet Take 1 tablet (50 mg total) by mouth at bedtime. Take with or immediately following a meal.  . Multiple Vitamin (MULTI-DAY PO) Take by mouth daily.  Marland Kitchen oxyCODONE (OXY IR/ROXICODONE) 5 MG immediate release tablet Take 1 tablet by mouth as needed.  . pravastatin (PRAVACHOL) 40 MG tablet Take 1 tablet (40 mg total) by mouth at bedtime.  . ranitidine (ZANTAC) 150 MG capsule Take 150 mg by mouth 2 (two) times daily.   . tizanidine (ZANAFLEX) 2 MG capsule Take 4 mg by mouth at bedtime.  . traMADol (ULTRAM) 50 MG tablet Take 2 tablets by mouth every 6 (six) hours as needed. Not to exceed 6 tablets a day   No facility-administered encounter medications on file as of 02/22/2018.     Activities of Daily Living In your present state of health, do you have any difficulty performing the following activities: 02/22/2018  Hearing? N  Vision? N  Difficulty concentrating or making decisions? N  Walking or climbing stairs? N  Dressing or bathing? N  Doing errands, shopping? N  Preparing Food and eating ? N  Using the Toilet? N  In the past six months, have you accidently leaked urine? N  Do you have problems with loss of bowel  control? N  Managing your Medications? N  Managing your Finances? N  Housekeeping or managing your Housekeeping? N  Some recent data might be hidden    Patient Care Team: Mosie Lukes, MD as PCP - General (Family Medicine)    Assessment:   This is a routine wellness examination for Giada. Physical assessment deferred to PCP.  Exercise Activities and Dietary recommendations Current Exercise Habits: The patient does not participate in regular exercise at present, Exercise limited by: cardiac condition(s)   Diet (meal preparation, eat out, water intake, caffeinated beverages, dairy products, fruits and vegetables): well balanced, on average, 2 meals per day  Pt reports drinking a lot of water.   Goals    . Weight (lb) < 170 lb (77.1 kg)     With diet and exercise       Fall Risk Fall Risk  02/22/2018 12/08/2017 06/07/2017 02/17/2017 10/09/2014  Falls in the past year? No No Yes No No  Comment - - Syncope - -  Number falls in past yr: - - 1 - -  Injury with Fall? - - No - -    Depression Screen PHQ 2/9 Scores 02/22/2018 02/17/2017 10/09/2014 10/07/2013  PHQ - 2 Score 0 0 0 0     Cognitive Function MMSE - Mini Mental State Exam 02/22/2018 12/08/2017 01/31/2017  Orientation to time 5 5 5   Orientation to Place 5 5 5   Registration 3 3 3   Attention/ Calculation 5 5  5  Recall 3 3 3   Language- name 2 objects 2 2 2   Language- repeat 1 1 1   Language- follow 3 step command 3 3 3   Language- read & follow direction 1 1 1   Write a sentence 1 1 1   Copy design 1 1 1   Total score 30 30 30         Immunization History  Administered Date(s) Administered  . Influenza Split 06/30/2011, 06/11/2012  . Influenza Whole 07/18/2007, 06/18/2008  . Influenza,inj,Quad PF,6+ Mos 06/04/2015  . Influenza-Unspecified 06/25/2013, 07/05/2014, 08/05/2016, 06/21/2017  . Pneumococcal Conjugate-13 10/19/2010  . Pneumococcal Polysaccharide-23 06/25/2008, 03/20/2010, 10/07/2013  . Td 12/08/2008  . Zoster  Recombinat (Shingrix) 02/17/2017, 05/19/2017    Screening Tests Health Maintenance  Topic Date Due  . COLONOSCOPY  03/18/2018  . INFLUENZA VACCINE  04/19/2018  . MAMMOGRAM  08/29/2018  . TETANUS/TDAP  12/09/2018  . DEXA SCAN  Completed  . Hepatitis C Screening  Completed  . PNA vac Low Risk Adult  Completed    Plan:    Follow up with Dr.Blyth today as scheduled  Please schedule your next medicare wellness visit with me in 1 yr.  Continue to eat heart healthy diet (full of fruits, vegetables, whole grains, lean protein, water--limit salt, fat, and sugar intake) and increase physical activity as tolerated.  Continue doing brain stimulating activities (puzzles, reading, adult coloring books, staying active) to keep memory sharp.   Bring a copy of your living will and/or healthcare power of attorney to your next office visit.   I have personally reviewed and noted the following in the patient's chart:   . Medical and social history . Use of alcohol, tobacco or illicit drugs  . Current medications and supplements . Functional ability and status . Nutritional status . Physical activity . Advanced directives . List of other physicians . Hospitalizations, surgeries, and ER visits in previous 12 months . Vitals . Screenings to include cognitive, depression, and falls . Referrals and appointments  In addition, I have reviewed and discussed with patient certain preventive protocols, quality metrics, and best practice recommendations. A written personalized care plan for preventive services as well as general preventive health recommendations were provided to patient.     Shela Nevin, [place]  02/22/2018    Medical screening examination/treatment was performed by qualified clinical staff member and as supervising physician I was immediately available for consultation/collaboration. I have reviewed documentation and agree with assessment and plan.  Penni Homans, MD

## 2018-02-16 ENCOUNTER — Encounter: Payer: Self-pay | Admitting: Cardiology

## 2018-02-16 ENCOUNTER — Ambulatory Visit: Payer: Medicare Other | Admitting: Cardiology

## 2018-02-16 VITALS — BP 142/92 | HR 92 | Ht 68.0 in | Wt 194.4 lb

## 2018-02-16 DIAGNOSIS — I422 Other hypertrophic cardiomyopathy: Secondary | ICD-10-CM

## 2018-02-16 DIAGNOSIS — R0602 Shortness of breath: Secondary | ICD-10-CM

## 2018-02-16 DIAGNOSIS — R55 Syncope and collapse: Secondary | ICD-10-CM | POA: Diagnosis not present

## 2018-02-16 MED ORDER — METOPROLOL SUCCINATE ER 25 MG PO TB24: 25.0000 mg | ORAL_TABLET | ORAL | 3 refills | Status: DC

## 2018-02-16 NOTE — Patient Instructions (Signed)
Medication Instructions:  Take- Metoprolol 25 mg in the morning and 50 mg at night  If you need a refill on your cardiac medications before your next appointment, please call your pharmacy.  Labwork: None Ordered   Testing/Procedures: None Ordered  Follow-Up: Your physician wants you to follow-up in: 2 Weeks.      Thank you for choosing [facility]!!

## 2018-02-20 ENCOUNTER — Encounter: Payer: Medicare Other | Admitting: Family Medicine

## 2018-02-22 ENCOUNTER — Encounter: Payer: Self-pay | Admitting: Gastroenterology

## 2018-02-22 ENCOUNTER — Ambulatory Visit (INDEPENDENT_AMBULATORY_CARE_PROVIDER_SITE_OTHER): Payer: Medicare Other | Admitting: Family Medicine

## 2018-02-22 ENCOUNTER — Encounter: Payer: Self-pay | Admitting: Family Medicine

## 2018-02-22 ENCOUNTER — Ambulatory Visit: Payer: Medicare Other | Admitting: *Deleted

## 2018-02-22 ENCOUNTER — Encounter: Payer: Self-pay | Admitting: *Deleted

## 2018-02-22 ENCOUNTER — Ambulatory Visit (INDEPENDENT_AMBULATORY_CARE_PROVIDER_SITE_OTHER): Payer: Medicare Other | Admitting: *Deleted

## 2018-02-22 VITALS — BP 126/78 | HR 57 | Temp 98.6°F | Resp 18 | Wt 194.0 lb

## 2018-02-22 VITALS — BP 126/78 | HR 57 | Ht 68.0 in | Wt 194.4 lb

## 2018-02-22 DIAGNOSIS — I517 Cardiomegaly: Secondary | ICD-10-CM | POA: Diagnosis not present

## 2018-02-22 DIAGNOSIS — Z1231 Encounter for screening mammogram for malignant neoplasm of breast: Secondary | ICD-10-CM

## 2018-02-22 DIAGNOSIS — R739 Hyperglycemia, unspecified: Secondary | ICD-10-CM

## 2018-02-22 DIAGNOSIS — Z1211 Encounter for screening for malignant neoplasm of colon: Secondary | ICD-10-CM | POA: Diagnosis not present

## 2018-02-22 DIAGNOSIS — Z1239 Encounter for other screening for malignant neoplasm of breast: Secondary | ICD-10-CM

## 2018-02-22 DIAGNOSIS — R911 Solitary pulmonary nodule: Secondary | ICD-10-CM

## 2018-02-22 DIAGNOSIS — Z0000 Encounter for general adult medical examination without abnormal findings: Secondary | ICD-10-CM

## 2018-02-22 DIAGNOSIS — E782 Mixed hyperlipidemia: Secondary | ICD-10-CM

## 2018-02-22 DIAGNOSIS — M8580 Other specified disorders of bone density and structure, unspecified site: Secondary | ICD-10-CM | POA: Diagnosis not present

## 2018-02-22 DIAGNOSIS — I10 Essential (primary) hypertension: Secondary | ICD-10-CM | POA: Diagnosis not present

## 2018-02-22 LAB — CBC
HEMATOCRIT: 39.9 % (ref 36.0–46.0)
Hemoglobin: 13.3 g/dL (ref 12.0–15.0)
MCHC: 33.2 g/dL (ref 30.0–36.0)
MCV: 88.9 fl (ref 78.0–100.0)
PLATELETS: 237 10*3/uL (ref 150.0–400.0)
RBC: 4.49 Mil/uL (ref 3.87–5.11)
RDW: 14 % (ref 11.5–15.5)
WBC: 7.1 10*3/uL (ref 4.0–10.5)

## 2018-02-22 LAB — COMPREHENSIVE METABOLIC PANEL
ALBUMIN: 4.3 g/dL (ref 3.5–5.2)
ALK PHOS: 82 U/L (ref 39–117)
ALT: 9 U/L (ref 0–35)
AST: 16 U/L (ref 0–37)
BUN: 15 mg/dL (ref 6–23)
CALCIUM: 9.8 mg/dL (ref 8.4–10.5)
CHLORIDE: 101 meq/L (ref 96–112)
CO2: 31 mEq/L (ref 19–32)
CREATININE: 0.73 mg/dL (ref 0.40–1.20)
GFR: 83.25 mL/min (ref >60.00)
Glucose, Bld: 99 mg/dL (ref 70–99)
POTASSIUM: 4.4 meq/L (ref 3.5–5.1)
Sodium: 137 mEq/L (ref 135–145)
Total Bilirubin: 0.5 mg/dL (ref 0.2–1.2)
Total Protein: 6.6 g/dL (ref 6.0–8.3)

## 2018-02-22 LAB — LIPID PANEL
CHOLESTEROL: 189 mg/dL (ref 0–200)
HDL: 64.2 mg/dL (ref >39.00)
LDL CALC: 101 mg/dL — AB (ref 0–99)
NonHDL: 124.53
TRIGLYCERIDES: 120 mg/dL (ref 0.0–149.0)
Total CHOL/HDL Ratio: 3
VLDL: 24 mg/dL (ref 0.0–40.0)

## 2018-02-22 LAB — HEMOGLOBIN A1C: Hgb A1c MFr Bld: 5.7 % (ref 4.6–6.5)

## 2018-02-22 LAB — TSH: TSH: 2.77 u[IU]/mL (ref 0.35–4.50)

## 2018-02-22 NOTE — Assessment & Plan Note (Signed)
Encouraged heart healthy diet, increase exercise, avoid trans fats, consider a krill oil cap daily 

## 2018-02-22 NOTE — Assessment & Plan Note (Addendum)
Largely asymptomatic, sees Dr Percival Spanish regularly they are trying to use a medication approach. Is using Metoprolol to try and regulate her blood pressure. Started Metoprolol XL 50 mg qhs and they have added 25 mg qam.

## 2018-02-22 NOTE — Patient Instructions (Signed)
Follow up with Dr.Blyth today as scheduled  Please schedule your next medicare wellness visit with me in 1 yr.  Continue to eat heart healthy diet (full of fruits, vegetables, whole grains, lean protein, water--limit salt, fat, and sugar intake) and increase physical activity as tolerated.  Continue doing brain stimulating activities (puzzles, reading, adult coloring books, staying active) to keep memory sharp.   Bring a copy of your living will and/or healthcare power of attorney to your next office visit.   Madeline [name] , Thank you for taking time to come for your Medicare Wellness Visit. I appreciate your ongoing commitment to your health goals. Please review the following plan we discussed and let me know if I can assist you in the future.   These are the goals we discussed: Goals    . Weight (lb) < 170 lb (77.1 kg)     With diet and exercise       This is a list of the screening recommended for you and due dates:  Health Maintenance  Topic Date Due  . Colon Cancer Screening  03/18/2018  . Flu Shot  04/19/2018  . Mammogram  08/29/2018  . Tetanus Vaccine  12/09/2018  . DEXA scan (bone density measurement)  Completed  .  Hepatitis C: One time screening is recommended by Center for Disease Control  (CDC) for  adults born from [birth year] through 1965.   Completed  . Pneumonia vaccines  Completed    Health Maintenance for Postmenopausal Women Menopause is a normal process in which your reproductive ability comes to an end. This process happens gradually over a span of months to years, usually between the ages of 33 and 13. Menopause is complete when you have missed 12 consecutive menstrual periods. It is important to talk with your health care provider about some of the most common conditions that affect postmenopausal women, such as heart disease, cancer, and bone loss (osteoporosis). Adopting a healthy lifestyle and getting preventive care can help to promote your health and wellness.  Those actions can also lower your chances of developing some of these common conditions. What should I know about menopause? During menopause, you may experience a number of symptoms, such as:  Moderate-to-severe hot flashes.  Night sweats.  Decrease in sex drive.  Mood swings.  Headaches.  Tiredness.  Irritability.  Memory problems.  Insomnia.  Choosing to treat or not to treat menopausal changes is an individual decision that you make with your health care provider. What should I know about hormone replacement therapy and supplements? Hormone therapy products are effective for treating symptoms that are associated with menopause, such as hot flashes and night sweats. Hormone replacement carries certain risks, especially as you become older. If you are thinking about using estrogen or estrogen with progestin treatments, discuss the benefits and risks with your health care provider. What should I know about heart disease and stroke? Heart disease, heart attack, and stroke become more likely as you age. This may be due, in part, to the hormonal changes that your body experiences during menopause. These can affect how your body processes dietary fats, triglycerides, and cholesterol. Heart attack and stroke are both medical emergencies. There are many things that you can do to help prevent heart disease and stroke:  Have your blood pressure checked at least every 1-2 years. High blood pressure causes heart disease and increases the risk of stroke.  If you are 30-61 years old, ask your health care provider if you should take  aspirin to prevent a heart attack or a stroke.  Do not use any tobacco products, including cigarettes, chewing tobacco, or electronic cigarettes. If you need help quitting, ask your health care provider.  It is important to eat a healthy diet and maintain a healthy weight. ? Be sure to include plenty of vegetables, fruits, low-fat dairy products, and lean  protein. ? Avoid eating foods that are high in solid fats, added sugars, or salt (sodium).  Get regular exercise. This is one of the most important things that you can do for your health. ? Try to exercise for at least 150 minutes each week. The type of exercise that you do should increase your heart rate and make you sweat. This is known as moderate-intensity exercise. ? Try to do strengthening exercises at least twice each week. Do these in addition to the moderate-intensity exercise.  Know your numbers.Ask your health care provider to check your cholesterol and your blood glucose. Continue to have your blood tested as directed by your health care provider.  What should I know about cancer screening? There are several types of cancer. Take the following steps to reduce your risk and to catch any cancer development as early as possible. Breast Cancer  Practice breast self-awareness. ? This means understanding how your breasts normally appear and feel. ? It also means doing regular breast self-exams. Let your health care provider know about any changes, no matter how small.  If you are 73 or older, have a clinician do a breast exam (clinical breast exam or CBE) every year. Depending on your age, family history, and medical history, it may be recommended that you also have a yearly breast X-ray (mammogram).  If you have a family history of breast cancer, talk with your health care provider about genetic screening.  If you are at high risk for breast cancer, talk with your health care provider about having an MRI and a mammogram every year.  Breast cancer (BRCA) gene test is recommended for women who have family members with BRCA-related cancers. Results of the assessment will determine the need for genetic counseling and BRCA1 and for BRCA2 testing. BRCA-related cancers include these types: ? Breast. This occurs in males or females. ? Ovarian. ? Tubal. This may also be called fallopian tube  cancer. ? Cancer of the abdominal or pelvic lining (peritoneal cancer). ? Prostate. ? Pancreatic.  Cervical, Uterine, and Ovarian Cancer Your health care provider may recommend that you be screened regularly for cancer of the pelvic organs. These include your ovaries, uterus, and vagina. This screening involves a pelvic exam, which includes checking for microscopic changes to the surface of your cervix (Pap test).  For women ages 21-65, health care providers may recommend a pelvic exam and a Pap test every three years. For women ages 44-65, they may recommend the Pap test and pelvic exam, combined with testing for human papilloma virus (HPV), every five years. Some types of HPV increase your risk of cervical cancer. Testing for HPV may also be done on women of any age who have unclear Pap test results.  Other health care providers may not recommend any screening for nonpregnant women who are considered low risk for pelvic cancer and have no symptoms. Ask your health care provider if a screening pelvic exam is right for you.  If you have had past treatment for cervical cancer or a condition that could lead to cancer, you need Pap tests and screening for cancer for at least 20  years after your treatment. If Pap tests have been discontinued for you, your risk factors (such as having a new sexual partner) need to be reassessed to determine if you should start having screenings again. Some women have medical problems that increase the chance of getting cervical cancer. In these cases, your health care provider may recommend that you have screening and Pap tests more often.  If you have a family history of uterine cancer or ovarian cancer, talk with your health care provider about genetic screening.  If you have vaginal bleeding after reaching menopause, tell your health care provider.  There are currently no reliable tests available to screen for ovarian cancer.  Lung Cancer Lung cancer screening is  recommended for adults 69-54 years old who are at high risk for lung cancer because of a history of smoking. A yearly low-dose CT scan of the lungs is recommended if you:  Currently smoke.  Have a history of at least 30 pack-years of smoking and you currently smoke or have quit within the past 15 years. A pack-year is smoking an average of one pack of cigarettes per day for one year.  Yearly screening should:  Continue until it has been 15 years since you quit.  Stop if you develop a health problem that would prevent you from having lung cancer treatment.  Colorectal Cancer  This type of cancer can be detected and can often be prevented.  Routine colorectal cancer screening usually begins at age 6 and continues through age 35.  If you have risk factors for colon cancer, your health care provider may recommend that you be screened at an earlier age.  If you have a family history of colorectal cancer, talk with your health care provider about genetic screening.  Your health care provider may also recommend using home test kits to check for hidden blood in your stool.  A small camera at the end of a tube can be used to examine your colon directly (sigmoidoscopy or colonoscopy). This is done to check for the earliest forms of colorectal cancer.  Direct examination of the colon should be repeated every 5-10 years until age 60. However, if early forms of precancerous polyps or small growths are found or if you have a family history or genetic risk for colorectal cancer, you may need to be screened more often.  Skin Cancer  Check your skin from head to toe regularly.  Monitor any moles. Be sure to tell your health care provider: ? About any new moles or changes in moles, especially if there is a change in a mole's shape or color. ? If you have a mole that is larger than the size of a pencil eraser.  If any of your family members has a history of skin cancer, especially at a young age,  talk with your health care provider about genetic screening.  Always use sunscreen. Apply sunscreen liberally and repeatedly throughout the day.  Whenever you are outside, protect yourself by wearing long sleeves, pants, a wide-brimmed hat, and sunglasses.  What should I know about osteoporosis? Osteoporosis is a condition in which bone destruction happens more quickly than new bone creation. After menopause, you may be at an increased risk for osteoporosis. To help prevent osteoporosis or the bone fractures that can happen because of osteoporosis, the following is recommended:  If you are 2-66 years old, get at least 1,000 mg of calcium and at least 600 mg of vitamin D per day.  If you are  older than age 58 but younger than age 56, get at least 1,200 mg of calcium and at least 600 mg of vitamin D per day.  If you are older than age 28, get at least 1,200 mg of calcium and at least 800 mg of vitamin D per day.  Smoking and excessive alcohol intake increase the risk of osteoporosis. Eat foods that are rich in calcium and vitamin D, and do weight-bearing exercises several times each week as directed by your health care provider. What should I know about how menopause affects my mental health? Depression may occur at any age, but it is more common as you become older. Common symptoms of depression include:  Low or sad mood.  Changes in sleep patterns.  Changes in appetite or eating patterns.  Feeling an overall lack of motivation or enjoyment of activities that you previously enjoyed.  Frequent crying spells.  Talk with your health care provider if you think that you are experiencing depression. What should I know about immunizations? It is important that you get and maintain your immunizations. These include:  Tetanus, diphtheria, and pertussis (Tdap) booster vaccine.  Influenza every year before the flu season begins.  Pneumonia vaccine.  Shingles vaccine.  Your health care  provider may also recommend other immunizations. This information is not intended to replace advice given to you by your health care provider. Make sure you discuss any questions you have with your health care provider. Document Released: 10/28/2005 Document Revised: 03/25/2016 Document Reviewed: 06/09/2015 [employer] Interactive Patient Education  2018 [employer].

## 2018-02-22 NOTE — Progress Notes (Signed)
Patient ID: Madeline [name], female   DOB: Mar 27, 1946, 72 y.o.   MRN: [id number]   Subjective:    Patient ID: Madeline [name], female    DOB: 09-Aug-1946, 72 y.o.   MRN: [id number]  No chief complaint on file.   HPI Patient is in today for annual exam and follow up on chronic medical concerns including hyperlipidemia, hypertension and LVH. She is doing well. She continues to struggle with daily pain and fatigue from her fibromyalgias but she pushes through it without meds. No recent febrile illness or hospitalizations. Denies CP/palp/SOB/HA/congestion/fevers/GI or GU c/o. Taking meds as prescribed. Doing well with activities of daily living. Tries to stay active and maintain a heart healthy diet.   Past Medical History:  Diagnosis Date  . Anxiety   . Arthritis 09-14-11   osteoarthritis, osteopenia  . Cataracts, both eyes 09-13-11   not surgical yet  . Cholelithiasis 05/17/2016  . Chronic neck pain 05/29/2016  . Cystitis 2007   sepsis post bladder biopsy, Dr Jeffie Pollock  . Fibromyalgia 09-14-11   Dr Donney Dice, [facility]  . Fractures 09-14-11   toes-left foot  . GERD (gastroesophageal reflux disease) 09-14-11   tx. Nexium  . H/O foot surgery 12/19/2011  . Hiatal hernia 05/17/2016  . Hyperglycemia 08/23/2016  . Hyperlipidemia   . Hypertension 09-13-11   tx. meds  . 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 Left ventricular hypertrophy 05/15/2017  . Muscle cramping 10/23/2017  . TMJ click 50-53-97   right side > left  . 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 ABDOMINAL HYSTERECTOMY  1977   dysfunctional menses; age 5  . APPENDECTOMY  1963  . COLONOSCOPY  2011   negative  . CYSTOSTOMY W/ BLADDER BIOPSY  04-2006  . FOOT ARTHRODESIS  01/03/12    ortho-- left hallux MP joint arthrodesis  . HAMMER TOE SURGERY  01/03/12   [facility], Dr Doran Durand  . NASAL SINUS SURGERY   09-14-11   right side " sinus lift"  . SIGMOIDOSCOPY    . sling procedure  2006   a and p repair  . TONSILLECTOMY    . TOTAL KNEE ARTHROPLASTY  09/21/2011   Procedure: TOTAL KNEE ARTHROPLASTY;  Surgeon: Gearlean Alf;  Location: [facility];  Service: Orthopedics;  Laterality: Left;  . TOTAL KNEE ARTHROPLASTY  06/25/2012   Procedure: TOTAL KNEE ARTHROPLASTY;  Surgeon: Gearlean Alf, MD;  Location: [facility];  Service: Orthopedics;  Laterality: Right;    Family History  Problem Relation Age of Onset  . Aortic aneurysm Father        femoral aneurysm  . Mental illness Brother        paranoid schizophrenia  . Pneumonia Brother        pneumothorax in [place]  . Heart attack Unknown        both GF;PGM; M uncle  . Stroke Paternal Aunt        aneurysm  . Colon cancer Paternal Aunt        X 2  . Other Mother        lung tumor which resolved w/o treatment  . Hypertension Mother   . Other Maternal Grandmother        enalraged heart  . Hypertension Maternal Grandmother   . Heart attack Maternal Grandfather        In fifties,  died.  . Hypertension Maternal Grandfather   . Heart attack Paternal Grandmother        In forties.  . Hypertension Paternal Grandmother   . Heart attack Paternal Grandfather   . Heart disease Paternal Grandfather   . Hypertension Paternal Grandfather   . Hypertension Daughter   . Supraventricular tachycardia Daughter   . Hypertension Son   . AAA (abdominal aortic aneurysm) Son   . Aortic dissection Sister        descending  . Diabetes Neg Hx     Social History   Socioeconomic History  . Marital status: Married    Spouse name: Not on file  . Number of children: 2  . Years of education: Not on file  . Highest education level: Not on file  Occupational History  . Not on file  Social Needs  . Financial resource strain: Not on file  . Food insecurity:    Worry: Not on file    Inability: Not on file  . Transportation needs:    Medical: Not on file     Non-medical: Not on file  Tobacco Use  . Smoking status: Never Smoker  . Smokeless tobacco: Never Used  Substance and Sexual Activity  . Alcohol use: Yes    Comment: rarely  . Drug use: No  . Sexual activity: Not Currently  Lifestyle  . Physical activity:    Days per week: Not on file    Minutes per session: Not on file  . Stress: Not on file  Relationships  . Social connections:    Talks on phone: Not on file    Gets together: Not on file    Attends religious service: Not on file    Active member of club or organization: Not on file    Attends meetings of clubs or organizations: Not on file    Relationship status: Not on file  . Intimate partner violence:    Fear of current or ex partner: Not on file    Emotionally abused: Not on file    Physically abused: Not on file    Forced sexual activity: Not on file  Other Topics Concern  . Not on file  Social History Narrative   Reg exercise 2 x weekly with trainer, no dietary restrictions. Trying to eat heart healthy.       Retired from [employer].    Outpatient Medications Prior to Visit  Medication Sig Dispense Refill  . aspirin EC 81 MG tablet Take 81 mg by mouth daily.    Marland Kitchen CALCIUM PO Take 600 mg by mouth 2 (two) times daily.     . Cholecalciferol (VITAMIN D3) 2000 UNITS TABS Take 2,000 Units by mouth daily.     . cyclobenzaprine (FLEXERIL) 10 MG tablet Take 20 mg by mouth at bedtime.    . diclofenac sodium (VOLTAREN) 1 % GEL Apply 1 application topically as needed.    . DULoxetine (CYMBALTA) 60 MG capsule Take 60 mg by mouth daily.    . fosinopril (MONOPRIL) 10 MG tablet Take 5 mg by mouth daily.    . metoprolol succinate (TOPROL XL) 25 MG 24 hr tablet Take 1 tablet (25 mg total) by mouth every morning. 90 tablet 3  . metoprolol succinate (TOPROL-XL) 50 MG 24 hr tablet Take 1 tablet (50 mg total) by mouth at bedtime. Take with or immediately following a meal. 90 tablet 3  . Multiple Vitamin (MULTI-DAY PO) Take by mouth  daily.    Marland Kitchen oxyCODONE (  OXY IR/ROXICODONE) 5 MG immediate release tablet Take 1 tablet by mouth as needed.    . pravastatin (PRAVACHOL) 40 MG tablet Take 1 tablet (40 mg total) by mouth at bedtime. 90 tablet 1  . ranitidine (ZANTAC) 150 MG capsule Take 150 mg by mouth 2 (two) times daily.     . tizanidine (ZANAFLEX) 2 MG capsule Take 4 mg by mouth at bedtime.    . traMADol (ULTRAM) 50 MG tablet Take 2 tablets by mouth every 6 (six) hours as needed. Not to exceed 6 tablets a day     No facility-administered medications prior to visit.     Allergies  Allergen Reactions  . Ivp Dye [Iodinated Diagnostic Agents]     Rash , hives  . Codeine Itching    Unless something given to counteract itching    Review of Systems  Constitutional: Positive for malaise/fatigue. Negative for fever.  HENT: Negative for congestion.   Eyes: Negative for blurred vision.  Respiratory: Negative for shortness of breath.   Cardiovascular: Negative for chest pain, palpitations and leg swelling.  Gastrointestinal: Negative for abdominal pain, blood in stool and nausea.  Genitourinary: Negative for dysuria and frequency.  Musculoskeletal: Positive for myalgias. Negative for falls.  Skin: Negative for rash.  Neurological: Negative for dizziness, loss of consciousness and headaches.  Endo/Heme/Allergies: Negative for environmental allergies.  Psychiatric/Behavioral: Negative for depression. The patient is not nervous/anxious.        Objective:    Physical Exam  Constitutional: She is oriented to person, place, and time. No distress.  HENT:  Head: Normocephalic and atraumatic.  Right Ear: External ear normal.  Left Ear: External ear normal.  Nose: Nose normal.  Mouth/Throat: Oropharynx is clear and moist. No oropharyngeal exudate.  Eyes: Pupils are equal, round, and reactive to light. Conjunctivae are normal. Right eye exhibits no discharge. Left eye exhibits no discharge. No scleral icterus.  Neck: Normal  range of motion. Neck supple. No thyromegaly present.  Cardiovascular: Normal rate, regular rhythm, normal heart sounds and intact distal pulses.  No murmur heard. Pulmonary/Chest: Effort normal and breath sounds normal. No respiratory distress. She has no wheezes. She has no rales.  Abdominal: Soft. Bowel sounds are normal. She exhibits no distension and no mass. There is no tenderness.  Musculoskeletal: Normal range of motion. She exhibits no edema or tenderness.  Lymphadenopathy:    She has no cervical adenopathy.  Neurological: She is alert and oriented to person, place, and time. She has normal reflexes. She displays normal reflexes. No cranial nerve deficit. Coordination normal.  Skin: Skin is warm and dry. No rash noted. She is not diaphoretic.    BP 126/78 (BP Location: Left Arm, Patient Position: Sitting, Cuff Size: Normal)   Pulse (!) 57   Temp 98.6 F (37 C) (Oral)   Resp 18   Wt 194 lb (88 kg)   SpO2 98%   BMI 29.50 kg/m  Wt Readings from Last 3 Encounters:  02/22/18 194 lb (88 kg)  02/22/18 194 lb 6.4 oz (88.2 kg)  02/16/18 194 lb 6.4 oz (88.2 kg)     Lab Results  Component Value Date   WBC 7.1 02/22/2018   HGB 13.3 02/22/2018   HCT 39.9 02/22/2018   PLT 237.0 02/22/2018   GLUCOSE 99 02/22/2018   CHOL 189 02/22/2018   TRIG 120.0 02/22/2018   HDL 64.20 02/22/2018   LDLCALC 101 (H) 02/22/2018   ALT 9 02/22/2018   AST 16 02/22/2018   NA 137  02/22/2018   K 4.4 02/22/2018   CL 101 02/22/2018   CREATININE 0.73 02/22/2018   BUN 15 02/22/2018   CO2 31 02/22/2018   TSH 2.77 02/22/2018   INR 1.02 06/15/2012   HGBA1C 5.7 02/22/2018    Lab Results  Component Value Date   TSH 2.77 02/22/2018   Lab Results  Component Value Date   WBC 7.1 02/22/2018   HGB 13.3 02/22/2018   HCT 39.9 02/22/2018   MCV 88.9 02/22/2018   PLT 237.0 02/22/2018   Lab Results  Component Value Date   NA 137 02/22/2018   K 4.4 02/22/2018   CO2 31 02/22/2018   GLUCOSE 99  02/22/2018   BUN 15 02/22/2018   CREATININE 0.73 02/22/2018   BILITOT 0.5 02/22/2018   ALKPHOS 82 02/22/2018   AST 16 02/22/2018   ALT 9 02/22/2018   PROT 6.6 02/22/2018   ALBUMIN 4.3 02/22/2018   CALCIUM 9.8 02/22/2018   ANIONGAP 9 07/18/2016   GFR 83.25 02/22/2018   Lab Results  Component Value Date   CHOL 189 02/22/2018   Lab Results  Component Value Date   HDL 64.20 02/22/2018   Lab Results  Component Value Date   LDLCALC 101 (H) 02/22/2018   Lab Results  Component Value Date   TRIG 120.0 02/22/2018   Lab Results  Component Value Date   CHOLHDL 3 02/22/2018   Lab Results  Component Value Date   HGBA1C 5.7 02/22/2018       Assessment & Plan:   Problem List Items Addressed This Visit    Hyperlipidemia    Encouraged heart healthy diet, increase exercise, avoid trans fats, consider a krill oil cap daily      Relevant Orders   Lipid panel (Completed)   Essential hypertension    Well controlled, no changes to meds. Encouraged heart healthy diet such as the DASH diet and exercise as tolerated.       Relevant Orders   CBC (Completed)   Comprehensive metabolic panel (Completed)   TSH (Completed)   Osteopenia    Encouraged to get adequate exercise, calcium and vitamin d intake      Preventative health care - Primary    Patient encouraged to maintain heart healthy diet, regular exercise, adequate sleep. Consider daily probiotics. Take medications as prescribed. She agrees to bring [place] a copy of ACP documents. MGM ordered referred for screening colonoscopy.       Hyperglycemia    hgba1c acceptable, minimize simple carbs. Increase exercise as tolerated. Continue current meds      Relevant Orders   Hemoglobin A1c (Completed)   Pulmonary nodule    Repeat CT scan to assess stability offered but patient is hesitant will check with radiology to evaluate need to repeat. After speaking with Dr Clovis Riley since she is an asymptomatic patient with low risk and no  history of smoking we do not need further imagining at this time. Will let patient know      Left ventricular hypertrophy    Largely asymptomatic, sees Dr Percival Spanish regularly they are trying to use a medication approach. Is using Metoprolol to try and regulate her blood pressure. Started Metoprolol XL 50 mg qhs and they have added 25 mg qam.        Other Visit Diagnoses    Breast cancer screening       Relevant Orders   MM DIAG BREAST TOMO BILATERAL   Colon cancer screening       Relevant Orders  Ambulatory referral to Gastroenterology      I am having Hulan Saas maintain her Vitamin D3, Multiple Vitamin (MULTI-DAY PO), CALCIUM PO, tizanidine, cyclobenzaprine, aspirin EC, ranitidine, traMADol, DULoxetine, diclofenac sodium, oxyCODONE, pravastatin, fosinopril, metoprolol succinate, and metoprolol succinate.  No orders of the defined types were placed in this encounter.    Penni Homans, MD

## 2018-02-22 NOTE — Assessment & Plan Note (Signed)
Well controlled, no changes to meds. Encouraged heart healthy diet such as the DASH diet and exercise as tolerated.  °

## 2018-02-22 NOTE — Assessment & Plan Note (Addendum)
Patient encouraged to maintain heart healthy diet, regular exercise, adequate sleep. Consider daily probiotics. Take medications as prescribed. She agrees to bring [place] a copy of ACP documents. MGM ordered referred for screening colonoscopy.

## 2018-02-22 NOTE — Patient Instructions (Addendum)
Encouraged increased hydration and fiber in diet. Daily probiotics. If bowels not moving can use MOM 2 tbls po in 4 oz of warm prune juice by mouth every 2-3 days. If no results then repeat in 4 hours with  Dulcolax suppository pr, may repeat again in 4 more hours as needed. Seek care if symptoms worsen. Consider daily Miralax and/or Dulcolax if symptoms persist.   Mix Miralax and Beneifiber mixed together once or twice daily  CoverMyMeds or Good Rx  Preventive Care 65 Years and Older, Female Preventive care refers to lifestyle choices and visits with your health care provider that can promote health and wellness. What does preventive care include?  A yearly physical exam. This is also called an annual well check.  Dental exams once or twice a year.  Routine eye exams. Ask your health care provider how often you should have your eyes checked.  Personal lifestyle choices, including: ? Daily care of your teeth and gums. ? Regular physical activity. ? Eating a healthy diet. ? Avoiding tobacco and drug use. ? Limiting alcohol use. ? Practicing safe sex. ? Taking low-dose aspirin every day. ? Taking vitamin and mineral supplements as recommended by your health care provider. What happens during an annual well check? The services and screenings done by your health care provider during your annual well check will depend on your age, overall health, lifestyle risk factors, and family history of disease. Counseling Your health care provider may ask you questions about your:  Alcohol use.  Tobacco use.  Drug use.  Emotional well-being.  Home and relationship well-being.  Sexual activity.  Eating habits.  History of falls.  Memory and ability to understand (cognition).  Work and work Statistician.  Reproductive health.  Screening You may have the following tests or measurements:  Height, weight, and BMI.  Blood pressure.  Lipid and cholesterol levels. These may be  checked every 5 years, or more frequently if you are over 79 years old.  Skin check.  Lung cancer screening. You may have this screening every year starting at age 11 if you have a 30-pack-year history of smoking and currently smoke or have quit within the past 15 years.  Fecal occult blood test (FOBT) of the stool. You may have this test every year starting at age 92.  Flexible sigmoidoscopy or colonoscopy. You may have a sigmoidoscopy every 5 years or a colonoscopy every 10 years starting at age 80.  Hepatitis C blood test.  Hepatitis B blood test.  Sexually transmitted disease (STD) testing.  Diabetes screening. This is done by checking your blood sugar (glucose) after you have not eaten for a while (fasting). You may have this done every 1-3 years.  Bone density scan. This is done to screen for osteoporosis. You may have this done starting at age 12.  Mammogram. This may be done every 1-2 years. Talk to your health care provider about how often you should have regular mammograms.  Talk with your health care provider about your test results, treatment options, and if necessary, the need for more tests. Vaccines Your health care provider may recommend certain vaccines, such as:  Influenza vaccine. This is recommended every year.  Tetanus, diphtheria, and acellular pertussis (Tdap, Td) vaccine. You may need a Td booster every 10 years.  Varicella vaccine. You may need this if you have not been vaccinated.  Zoster vaccine. You may need this after age 45.  Measles, mumps, and rubella (MMR) vaccine. You may need at least  one dose of MMR if you were born in 1957 or later. You may also need a second dose.  Pneumococcal 13-valent conjugate (PCV13) vaccine. One dose is recommended after age 73.  Pneumococcal polysaccharide (PPSV23) vaccine. One dose is recommended after age 19.  Meningococcal vaccine. You may need this if you have certain conditions.  Hepatitis A vaccine. You may  need this if you have certain conditions or if you travel or work in places where you may be exposed to hepatitis A.  Hepatitis B vaccine. You may need this if you have certain conditions or if you travel or work in places where you may be exposed to hepatitis B.  Haemophilus influenzae type b (Hib) vaccine. You may need this if you have certain conditions.  Talk to your health care provider about which screenings and vaccines you need and how often you need them. This information is not intended to replace advice given to you by your health care provider. Make sure you discuss any questions you have with your health care provider. Document Released: 10/02/2015 Document Revised: 05/25/2016 Document Reviewed: 07/07/2015 Elsevier Interactive Patient Education  [employer].

## 2018-02-22 NOTE — Assessment & Plan Note (Signed)
hgba1c acceptable, minimize simple carbs. Increase exercise as tolerated. Continue current meds 

## 2018-02-22 NOTE — Assessment & Plan Note (Signed)
Encouraged to get adequate exercise, calcium and vitamin d intake 

## 2018-02-22 NOTE — Assessment & Plan Note (Addendum)
Repeat CT scan to assess stability offered but patient is hesitant will check with radiology to evaluate need to repeat. After speaking with Dr Clovis Riley since she is an asymptomatic patient with low risk and no history of smoking we do not need further imagining at this time. Will let patient know

## 2018-03-01 ENCOUNTER — Ambulatory Visit (HOSPITAL_BASED_OUTPATIENT_CLINIC_OR_DEPARTMENT_OTHER)
Admission: RE | Admit: 2018-03-01 | Discharge: 2018-03-01 | Disposition: A | Discharge status: Home / Self Care | Payer: Medicare Other | Source: Ambulatory Visit | Attending: Family Medicine | Admitting: Family Medicine

## 2018-03-01 ENCOUNTER — Encounter (HOSPITAL_BASED_OUTPATIENT_CLINIC_OR_DEPARTMENT_OTHER): Payer: Self-pay

## 2018-03-01 DIAGNOSIS — Z1239 Encounter for other screening for malignant neoplasm of breast: Secondary | ICD-10-CM

## 2018-03-01 DIAGNOSIS — Z1231 Encounter for screening mammogram for malignant neoplasm of breast: Secondary | ICD-10-CM | POA: Diagnosis not present

## 2018-03-07 NOTE — Progress Notes (Signed)
Cardiology Office Note   Date:  03/08/2018   ID:  Madeline [name], DOB 26-Feb-1946, MRN [id number]  PCP:  Mosie Lukes, MD  Cardiologist:   Minus Breeding, MD  Referring:  Mosie Lukes, MD  Chief Complaint  Patient presents with  . Cardiomyopathy      History of Present Illness: Madeline [name] is a 72 y.o. female who presents for evaluation of dyspnea.  She has had a POET (Plain Old Exercise Treadmill) which was negative for evidence of ischemia.   She did have a past history of syncope and a slightly abnormal EKG.  I had her wear a monitor that demonstrated no arrhythmias.  Echo demonstrated significant LVH on echo with septal hypertrophy. MRI confirmed severe basal septal hypertrophy without uptake of gad.   At the last visit she had dyspnea and she I switched her from Cardizem to verapamil.   Unfortunately the verapamil caused constipation.  She did not see any improvement in her dyspnea.  At a previous visit I stopped the calcium channel blocker.  At the last visit I increased Toprol XL.    Her breathing is better.  She is doing some activities and she tolerated the med change.  She is had no presyncope or syncope.  She thinks she is having less of the dyspnea.  She tolerate the beta blocker increase and she has had no presyncope or syncope.    Past Medical History:  Diagnosis Date  . Anxiety   . Arthritis 09-14-11   osteoarthritis, osteopenia  . Cataracts, both eyes 09-13-11   not surgical yet  . Cholelithiasis 05/17/2016  . Chronic neck pain 05/29/2016  . Cystitis 2007   sepsis post bladder biopsy, Dr Jeffie Pollock  . Fibromyalgia 09-14-11   Dr Donney Dice, [facility]  . Fractures 09-14-11   toes-left foot  . GERD (gastroesophageal reflux disease) 09-14-11   tx. Nexium  . H/O foot surgery 12/19/2011  . Hiatal hernia 05/17/2016  . Hyperglycemia 08/23/2016  . Hyperlipidemia   . Hypertension 09-13-11   tx. meds  . 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 Left ventricular hypertrophy 05/15/2017  . Muscle cramping 10/23/2017  . TMJ click 24-40-10   right side > left  . 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 ABDOMINAL HYSTERECTOMY  1977   dysfunctional menses; age 20  . APPENDECTOMY  1963  . COLONOSCOPY  2011   negative  . CYSTOSTOMY W/ BLADDER BIOPSY  04-2006  . FOOT ARTHRODESIS  01/03/12   [facility]-- left hallux MP joint arthrodesis  . HAMMER TOE SURGERY  01/03/12   [facility], Dr Doran Durand  . NASAL SINUS SURGERY  09-14-11   right side " sinus lift"  . SIGMOIDOSCOPY    . sling procedure  2006   a and p repair  . TONSILLECTOMY    . TOTAL KNEE ARTHROPLASTY  09/21/2011   Procedure: TOTAL KNEE ARTHROPLASTY;  Surgeon: Gearlean Alf;  Location: [facility];  Service: Orthopedics;  Laterality: Left;  . TOTAL KNEE ARTHROPLASTY  06/25/2012   Procedure: TOTAL KNEE ARTHROPLASTY;  Surgeon: Gearlean Alf, MD;  Location: [facility];  Service: Orthopedics;  Laterality: Right;     Current Outpatient Medications  Medication Sig Dispense Refill  . aspirin EC 81 MG tablet Take 81 mg by mouth daily.    Marland Kitchen CALCIUM PO  Take 600 mg by mouth 2 (two) times daily.     . Cholecalciferol (VITAMIN D3) 2000 UNITS TABS Take 2,000 Units by mouth daily.     . cyclobenzaprine (FLEXERIL) 10 MG tablet Take 20 mg by mouth at bedtime.    . diclofenac sodium (VOLTAREN) 1 % GEL Apply 1 application topically as needed.    . DULoxetine (CYMBALTA) 60 MG capsule Take 60 mg by mouth daily.    . metoprolol succinate (TOPROL-XL) 50 MG 24 hr tablet Take 1 tablet (50 mg total) by mouth 2 (two) times daily. Take with or immediately following a meal. 180 tablet 3  . Multiple Vitamin (MULTI-DAY PO) Take by mouth daily.    Marland Kitchen oxyCODONE (OXY IR/ROXICODONE) 5 MG immediate release tablet Take 1 tablet by mouth as needed.    . pravastatin (PRAVACHOL) 40 MG tablet Take 1 tablet (40 mg  total) by mouth at bedtime. 90 tablet 1  . ranitidine (ZANTAC) 150 MG capsule Take 150 mg by mouth 2 (two) times daily.     . tizanidine (ZANAFLEX) 2 MG capsule Take 4 mg by mouth at bedtime.    . traMADol (ULTRAM) 50 MG tablet Take 2 tablets by mouth every 6 (six) hours as needed. Not to exceed 6 tablets a day    . fosinopril (MONOPRIL) 10 MG tablet Take 0.5 tablets (5 mg total) by mouth daily. 45 tablet 3   No current facility-administered medications for this visit.     Allergies:   Ivp dye [iodinated diagnostic agents] and Codeine    ROS:  Please see the history of present illness.   Otherwise, review of systems are positive for none.   All other systems are reviewed and negative.    PHYSICAL EXAM: VS:  BP 133/82   Pulse 84   Ht 5\' 8"  (1.727 m)   Wt 196 lb 3.2 oz (89 kg)   BMI 29.83 kg/m  , BMI Body mass index is 29.83 kg/m.  GENERAL:  Well appearing NECK:  No jugular venous distention, waveform within normal limits, carotid upstroke brisk and symmetric, no bruits, no thyromegaly LUNGS:  Clear to auscultation bilaterally CHEST:  Unremarkable HEART:  PMI not displaced or sustained,S1 and S2 within normal limits, no S3, no S4, no clicks, no rubs, no murmurs ABD:  Flat, positive bowel sounds normal in frequency in pitch, no bruits, no rebound, no guarding, no midline pulsatile mass, no hepatomegaly, no splenomegaly EXT:  2 plus pulses throughout, no edema, no cyanosis no clubbing    EKG:  EKG is not ordered today.    Recent Labs: 10/23/2017: Magnesium 2.3 02/22/2018: ALT 9; BUN 15; Creatinine, Ser 0.73; Hemoglobin 13.3; Platelets 237.0; Potassium 4.4; Sodium 137; TSH 2.77    Lipid Panel    Component Value Date/Time   CHOL 189 02/22/2018 1006   TRIG 120.0 02/22/2018 1006   HDL 64.20 02/22/2018 1006   CHOLHDL 3 02/22/2018 1006   VLDL 24.0 02/22/2018 1006   LDLCALC 101 (H) 02/22/2018 1006      Wt Readings from Last 3 Encounters:  03/08/18 196 lb 3.2 oz (89 kg)    02/22/18 194 lb (88 kg)  02/22/18 194 lb 6.4 oz (88.2 kg)      Other studies Reviewed: Additional studies/ records that were reviewed today include:  No she has had no high risk findings.  ne Review of the above records demonstrates:      ASSESSMENT AND PLAN:  SEPTAL HYPERTROPHY:   She has had no high  risk findings.    She is had a treadmill, monitor and now the MRI.  I am managing this medically.  I will follow this clinically and with repeat echoes in the future.   DYSPNEA:   This is improved.  See below.  QT:    This was not prolonged on the last EKG.   SYNCOPE:   No further episodes.   HTN:  BP is not controlled.  I will increase the beta blocker to Toprol XL 50 mg bid.  We will move slowly with her med sensitivities.    ILIAC ANEURYSM:  She will be getting a follow up next week.  Current medicines are reviewed at length with the patient today.  The patient does not have concerns regarding medicines.  The following changes have been made:As above.   Labs/ tests ordered today include:   None No orders of the defined types were placed in this encounter.    Disposition:   FU with me in 2 months.  Signed, Minus Breeding, MD  03/08/2018 2:47 PM    [facility]

## 2018-03-08 ENCOUNTER — Encounter: Payer: Self-pay | Admitting: Cardiology

## 2018-03-08 ENCOUNTER — Ambulatory Visit: Payer: Medicare Other | Admitting: Cardiology

## 2018-03-08 VITALS — BP 133/82 | HR 84 | Ht 68.0 in | Wt 196.2 lb

## 2018-03-08 DIAGNOSIS — I10 Essential (primary) hypertension: Secondary | ICD-10-CM

## 2018-03-08 DIAGNOSIS — I422 Other hypertrophic cardiomyopathy: Secondary | ICD-10-CM

## 2018-03-08 MED ORDER — FOSINOPRIL SODIUM 10 MG PO TABS: 5.0000 mg | ORAL_TABLET | Freq: Every day | ORAL | 3 refills | Status: DC

## 2018-03-08 MED ORDER — METOPROLOL SUCCINATE ER 50 MG PO TB24: 50.0000 mg | ORAL_TABLET | Freq: Two times a day (BID) | ORAL | 3 refills | Status: DC

## 2018-03-08 NOTE — Patient Instructions (Signed)
Medication Instructions:  INCREASE- Metoprolol 50 mg twice a day  If you need a refill on your cardiac medications before your next appointment, please call your pharmacy.  Labwork: None Ordered   Testing/Procedures: None Ordered   Follow-Up: Your physician wants you to follow-up in: 2 Months.     Thank you for choosing [facility] at [facility]!!

## 2018-03-09 ENCOUNTER — Other Ambulatory Visit: Payer: Self-pay | Admitting: Cardiology

## 2018-03-09 DIAGNOSIS — I714 Abdominal aortic aneurysm, without rupture, unspecified: Secondary | ICD-10-CM

## 2018-03-15 ENCOUNTER — Ambulatory Visit (HOSPITAL_COMMUNITY)
Admission: RE | Admit: 2018-03-15 | Discharge: 2018-03-15 | Disposition: A | Discharge status: Home / Self Care | Payer: Medicare Other | Source: Ambulatory Visit | Attending: Cardiovascular Disease | Admitting: Cardiovascular Disease

## 2018-03-15 DIAGNOSIS — I10 Essential (primary) hypertension: Secondary | ICD-10-CM | POA: Insufficient documentation

## 2018-03-15 DIAGNOSIS — I728 Aneurysm of other specified arteries: Secondary | ICD-10-CM | POA: Insufficient documentation

## 2018-03-15 DIAGNOSIS — I714 Abdominal aortic aneurysm, without rupture, unspecified: Secondary | ICD-10-CM

## 2018-03-15 DIAGNOSIS — E785 Hyperlipidemia, unspecified: Secondary | ICD-10-CM | POA: Insufficient documentation

## 2018-03-15 DIAGNOSIS — I771 Stricture of artery: Secondary | ICD-10-CM | POA: Insufficient documentation

## 2018-04-12 ENCOUNTER — Ambulatory Visit (AMBULATORY_SURGERY_CENTER): Payer: Self-pay

## 2018-04-12 VITALS — Ht 68.0 in | Wt 197.2 lb

## 2018-04-12 DIAGNOSIS — Z1211 Encounter for screening for malignant neoplasm of colon: Secondary | ICD-10-CM

## 2018-04-12 MED ORDER — PEG-KCL-NACL-NASULF-NA ASC-C 140 G PO SOLR: 1.0000 | Freq: Once | ORAL | Status: AC

## 2018-04-12 NOTE — Progress Notes (Signed)
Per pt, no allergies to soy or egg products.Pt not taking any weight loss meds or using  O2 at home.  Pt refused emmi video. 

## 2018-04-17 ENCOUNTER — Encounter: Payer: Self-pay | Admitting: Gastroenterology

## 2018-04-26 ENCOUNTER — Encounter: Payer: Self-pay | Admitting: Gastroenterology

## 2018-04-26 ENCOUNTER — Ambulatory Visit (AMBULATORY_SURGERY_CENTER): Payer: Medicare Other | Admitting: Gastroenterology

## 2018-04-26 VITALS — BP 135/80 | HR 60 | Temp 98.6°F | Resp 14 | Ht 68.0 in | Wt 196.0 lb

## 2018-04-26 DIAGNOSIS — Z1211 Encounter for screening for malignant neoplasm of colon: Secondary | ICD-10-CM

## 2018-04-26 DIAGNOSIS — D123 Benign neoplasm of transverse colon: Secondary | ICD-10-CM | POA: Diagnosis not present

## 2018-04-26 DIAGNOSIS — D125 Benign neoplasm of sigmoid colon: Secondary | ICD-10-CM

## 2018-04-26 MED ORDER — SODIUM CHLORIDE 0.9 % IV SOLN: 500.0000 mL | Freq: Once | INTRAVENOUS | Status: DC

## 2018-04-26 NOTE — Progress Notes (Signed)
Report given to PACU, vss 

## 2018-04-26 NOTE — Progress Notes (Signed)
Called to room to assist during endoscopic procedure.  Patient ID and intended procedure confirmed with present staff. Received instructions for my participation in the procedure from the performing physician.  

## 2018-04-26 NOTE — Op Note (Signed)
[facility] Patient Name: Madeline [name] Procedure Date: 04/26/2018 9:22 AM MRN: [id number] Endoscopist: Mauri Pole , MD Age: 72 Referring MD:  Date of Birth: [birth year] Gender: Female Account #: [id number] Procedure:                Colonoscopy Medicines:                Monitored Anesthesia Care Procedure:                Pre-Anesthesia Assessment:                           - Prior to the procedure, a History and Physical                            was performed, and patient medications and                            allergies were reviewed. The patient's tolerance of                            previous anesthesia was also reviewed. The risks                            and benefits of the procedure and the sedation                            options and risks were discussed with the patient.                            All questions were answered, and informed consent                            was obtained. Prior Anticoagulants: The patient has                            taken no previous anticoagulant or antiplatelet                            agents. ASA Grade Assessment: III - A patient with                            severe systemic disease. After reviewing the risks                            and benefits, the patient was deemed in                            satisfactory condition to undergo the procedure.                           After obtaining informed consent, the colonoscope                            was passed under direct vision. Throughout the  procedure, the patient's blood pressure, pulse, and                            oxygen saturations were monitored continuously. The                            Model PCF-H190DL [id number]) scope was introduced                            through the anus and advanced to the the cecum,                            identified by appendiceal orifice and ileocecal   valve. The colonoscopy was performed without                            difficulty. The patient tolerated the procedure                            well. The quality of the bowel preparation was                            adequate. The ileocecal valve, appendiceal orifice,                            and rectum were photographed. Scope In: 9:31:09 AM Scope Out: 9:58:16 AM Scope Withdrawal Time: 0 hours 14 minutes 35 seconds  Total Procedure Duration: 0 hours 27 minutes 7 seconds  Findings:                 The perianal and digital rectal examinations were                            normal.                           Two sessile polyps were found in the sigmoid colon                            and transverse colon. The polyps were 5 to 9 mm in                            size.                           Multiple small and large-mouthed diverticula were                            found in the sigmoid colon, descending colon,                            transverse colon and ascending colon.                           Non-bleeding internal hemorrhoids were found during  retroflexion. The hemorrhoids were small. Complications:            No immediate complications. Estimated Blood Loss:     Estimated blood loss was minimal. Impression:               - Two 5 to 9 mm polyps in the sigmoid colon and in                            the transverse colon.                           - Diverticulosis in the sigmoid colon, in the                            descending colon, in the transverse colon and in                            the ascending colon.                           - Non-bleeding internal hemorrhoids.                           - No specimens collected. Recommendation:           - Patient has a contact number available for                            emergencies. The signs and symptoms of potential                            delayed complications were discussed with the                             patient. Return to normal activities tomorrow.                            Written discharge instructions were provided to the                            patient.                           - Resume previous diet.                           - Continue present medications.                           - Await pathology results.                           - Repeat colonoscopy in 3 - 5 years for                            surveillance based on pathology results. Mauri Pole, MD 04/26/2018 10:07:05 AM This report has been signed  electronically.

## 2018-04-26 NOTE — Patient Instructions (Signed)
**   Handouts given on polyps and diverticulosis **   YOU HAD AN ENDOSCOPIC PROCEDURE TODAY AT THE [facility]:   Refer to the procedure report that was given to you for any specific questions about what was found during the examination.  If the procedure report does not answer your questions, please call your gastroenterologist to clarify.  If you requested that your care partner not be given the details of your procedure findings, then the procedure report has been included in a sealed envelope for you to review at your convenience later.  YOU SHOULD EXPECT: Some feelings of bloating in the abdomen. Passage of more gas than usual.  Walking can help get rid of the air that was put into your GI tract during the procedure and reduce the bloating. If you had a lower endoscopy (such as a colonoscopy or flexible sigmoidoscopy) you may notice spotting of blood in your stool or on the toilet paper. If you underwent a bowel prep for your procedure, you may not have a normal bowel movement for a few days.  Please Note:  You might notice some irritation and congestion in your nose or some drainage.  This is from the oxygen used during your procedure.  There is no need for concern and it should clear up in a day or so.  SYMPTOMS TO REPORT IMMEDIATELY:   Following lower endoscopy (colonoscopy or flexible sigmoidoscopy):  Excessive amounts of blood in the stool  Significant tenderness or worsening of abdominal pains  Swelling of the abdomen that is new, acute  Fever of 100F or higher  For urgent or emergent issues, a gastroenterologist can be reached at any hour by calling [phone].   DIET:  We do recommend a small meal at first, but then you may proceed to your regular diet.  Drink plenty of fluids but you should avoid alcoholic beverages for 24 hours.  ACTIVITY:  You should plan to take it easy for the rest of today and you should NOT DRIVE or use heavy machinery until tomorrow (because  of the sedation medicines used during the test).    FOLLOW UP: Our staff will call the number listed on your records the next business day following your procedure to check on you and address any questions or concerns that you may have regarding the information given to you following your procedure. If we do not reach you, we will leave a message.  However, if you are feeling well and you are not experiencing any problems, there is no need to return our call.  We will assume that you have returned to your regular daily activities without incident.  If any biopsies were taken you will be contacted by phone or by letter within the next 1-3 weeks.  Please call us at [phone] if you have not heard about the biopsies in 3 weeks.    SIGNATURES/CONFIDENTIALITY: You and/or your care partner have signed paperwork which will be entered into your electronic medical record.  These signatures attest to the fact that that the information above on your After Visit Summary has been reviewed and is understood.  Full responsibility of the confidentiality of this discharge information lies with you and/or your care-partner. 

## 2018-04-26 NOTE — Progress Notes (Signed)
Pt's states no medical or surgical changes since previsit or office visit. 

## 2018-04-27 ENCOUNTER — Telehealth: Payer: Self-pay

## 2018-04-27 NOTE — Telephone Encounter (Signed)
  Follow up Call-  Call back number 04/26/2018  Post procedure Call Back phone  # [phone]  Permission to leave phone message Yes  Some recent data might be hidden     Patient questions:  Do you have a fever, pain , or abdominal swelling? No. Pain Score  0 *  Have you tolerated food without any problems? Yes.    Have you been able to return to your normal activities? Yes.    Do you have any questions about your discharge instructions: Diet   No. Medications  No. Follow up visit  No.  Do you have questions or concerns about your Care? No.  Actions: * If pain score is 4 or above: No action needed, pain <4.

## 2018-05-03 ENCOUNTER — Encounter: Payer: Self-pay | Admitting: Gastroenterology

## 2018-05-08 NOTE — Progress Notes (Signed)
Cardiology Office Note   Date:  05/09/2018   ID:  Madeline [name], DOB 12/08/1945, MRN [id number]  PCP:  Mosie Lukes, MD  Cardiologist:   Minus Breeding, MD  Referring:  Mosie Lukes, MD  Chief Complaint  Patient presents with  . Shortness of Breath      History of Present Illness: Madeline [name] is a 72 y.o. female who presents for evaluation of dyspnea.  She has had a POET (Plain Old Exercise Treadmill) which was negative for evidence of ischemia.   She did have a past history of syncope and a slightly abnormal EKG.  I had her wear a monitor that demonstrated no arrhythmias.  Echo demonstrated significant LVH on echo with septal hypertrophy. MRI confirmed severe basal septal hypertrophy without uptake of gad.   At the last visit she had dyspnea and she I switched her from Cardizem to verapamil.   Unfortunately the verapamil caused constipation.  She did not see any improvement in her dyspnea.  At a previous visit I stopped the calcium channel blocker.  At the last visit I increased Toprol XL to bid.   She returns for follow up.  She is overall feeling better. She is tolerating the Toprol XL increase well, though endorse bouts of lightheadedness that resolves on its own, states it is necesarrily associated with exertion, it can happen at rest. No syncopal episodes. No LOC, heart palpitations. Still describes some DOE, but feels it is improving. States she has been able to walk across the house without worsening SOB.    Past Medical History:  Diagnosis Date  . Anxiety   . Arthritis 09-14-11   osteoarthritis, osteopenia  . Cataracts, both eyes 09-13-11   not surgical yet  . Cholelithiasis 05/17/2016  . Chronic neck pain 05/29/2016  . Cystitis 2007   sepsis post bladder biopsy, Dr Jeffie Pollock  . Fibromyalgia 09-14-11   Dr Donney Dice, [facility]  . Fractures 09/14/2011   toes-left foot  . GERD (gastroesophageal reflux disease) 09-14-11   tx. Nexium  . H/O foot surgery  12/19/2011  . Hiatal hernia 05/17/2016  . Hyperglycemia 08/23/2016  . Hyperlipidemia   . Hypertension 09-13-11   tx. meds  . 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 Left ventricular hypertrophy 05/15/2017  . Muscle cramping 10/23/2017  . TMJ click 25/36/6440   right side > left  . 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 ABDOMINAL HYSTERECTOMY  1977   dysfunctional menses; age 47  . APPENDECTOMY  1963  . COLONOSCOPY  2011   negative  . CYSTOSTOMY W/ BLADDER BIOPSY  04-2006  . FOOT ARTHRODESIS  01/03/12   [facility]-- left hallux MP joint arthrodesis  . HAMMER TOE SURGERY  01/03/12   [facility], Dr Doran Durand  . NASAL SINUS SURGERY  09-14-11   right side " sinus lift"  . SIGMOIDOSCOPY    . sling procedure  2006   a and p repair  . TONSILLECTOMY    . TOTAL KNEE ARTHROPLASTY  09/21/2011/[facility]   Procedure: TOTAL KNEE ARTHROPLASTY;  Surgeon: Gearlean Alf;  Location: [facility];  Service: Orthopedics;  Laterality: Left;  . TOTAL KNEE ARTHROPLASTY  06/25/2012   Procedure: TOTAL KNEE ARTHROPLASTY;  Surgeon: Gearlean Alf, MD;  Location: [facility];  Service: Orthopedics;  Laterality: Right;     Current Outpatient  Medications  Medication Sig Dispense Refill  . aspirin EC 81 MG tablet Take 81 mg by mouth daily.    Marland Kitchen CALCIUM PO Take 600 mg by mouth 2 (two) times daily.     . Cholecalciferol (VITAMIN D3) 2000 UNITS TABS Take 2,000 Units by mouth daily.     . cyclobenzaprine (FLEXERIL) 10 MG tablet Take 20 mg by mouth at bedtime.    . DULoxetine (CYMBALTA) 60 MG capsule Take 60 mg by mouth daily.    . fosinopril (MONOPRIL) 10 MG tablet Take 0.5 tablets (5 mg total) by mouth daily. 45 tablet 3  . furosemide (LASIX) 20 MG tablet Take 20 mg by mouth as needed.    . metoprolol succinate (TOPROL-XL) 50 MG 24 hr tablet Take 1 tablet (50 mg total) by mouth 2 (two) times daily. Take with or  immediately following a meal. 180 tablet 3  . Multiple Vitamin (MULTI-DAY PO) Take by mouth daily.    Marland Kitchen oxyCODONE (OXY IR/ROXICODONE) 5 MG immediate release tablet Take 1 tablet by mouth as needed.    . pravastatin (PRAVACHOL) 40 MG tablet Take 1 tablet (40 mg total) by mouth at bedtime. 90 tablet 1  . ranitidine (ZANTAC) 150 MG capsule Take 150 mg by mouth 2 (two) times daily.     . tizanidine (ZANAFLEX) 2 MG capsule Take 4 mg by mouth at bedtime.    . traMADol (ULTRAM) 50 MG tablet Take 2 tablets by mouth every 6 (six) hours as needed. Not to exceed 6 tablets a day     No current facility-administered medications for this visit.     Allergies:   Ivp dye [iodinated diagnostic agents] and Codeine    ROS:  Please see the history of present illness.   Otherwise, review of systems are positive for none.   All other systems are reviewed and negative.    PHYSICAL EXAM: VS:  BP 112/84   Pulse 79   Ht 5\' 8"  (1.727 m)   Wt 193 lb 9.6 oz (87.8 kg)   SpO2 98%   BMI 29.44 kg/m  , BMI Body mass index is 29.44 kg/m.  GENERAL:  Well appearing NECK:  No jugular venous distention, waveform within normal limits, carotid upstroke brisk and symmetric, no bruits, no thyromegaly LUNGS:  Clear to auscultation bilaterally CHEST:  Unremarkable HEART:  PMI not displaced or sustained,S1 and S2 within normal limits, no S3, no S4, no clicks, no rubs, no murmurs ABD:  Flat, positive bowel sounds normal in frequency in pitch, no bruits, no rebound, no guarding, no midline pulsatile mass, no hepatomegaly, no splenomegaly EXT:  2 plus pulses throughout, no edema, no cyanosis no clubbing    EKG:  EKG is not ordered today.    Recent Labs: 10/23/2017: Magnesium 2.3 02/22/2018: ALT 9; BUN 15; Creatinine, Ser 0.73; Hemoglobin 13.3; Platelets 237.0; Potassium 4.4; Sodium 137; TSH 2.77    Lipid Panel    Component Value Date/Time   CHOL 189 02/22/2018 1006   TRIG 120.0 02/22/2018 1006   HDL 64.20 02/22/2018  1006   CHOLHDL 3 02/22/2018 1006   VLDL 24.0 02/22/2018 1006   LDLCALC 101 (H) 02/22/2018 1006      Wt Readings from Last 3 Encounters:  05/09/18 193 lb 9.6 oz (87.8 kg)  04/26/18 196 lb (88.9 kg)  04/12/18 197 lb 3.2 oz (89.4 kg)      Other studies Reviewed: Additional studies/ records that were reviewed today include:  none Review of the above records  demonstrates:      ASSESSMENT AND PLAN:  SEPTAL HYPERTROPHY:      She has had no high risk findings.    She is had a treadmill, monitor and now the MRI.  I am managing this medically.  I will follow this clinically and with repeat echoes in the future.   DYSPNEA:   This is has improved.  No further evaluation.   QT:     This was not prolonged on the last EKG.   SYNCOPE:   No further episodes.   HTN:  BP is controlled now after the increase to Toprol XL 50 mg bid.  No need for further increase.   ILIAC ANEURYSM:   Stable left common iliac artery aneurysm, measuring 1.6 cm AP x 1.8 cm TRV.  Will continue to watch this.    Current medicines are reviewed at length with the patient today.  The patient does not have concerns regarding medicines.  The following changes have been made:   None.  Labs/ tests ordered today include:   None. No orders of the defined types were placed in this encounter.    Disposition:   FU with me in 6 months.    Signed, Minus Breeding, MD  05/09/2018 6:12 PM    [facility]

## 2018-05-09 ENCOUNTER — Ambulatory Visit: Payer: Medicare Other | Admitting: Cardiology

## 2018-05-09 ENCOUNTER — Encounter: Payer: Self-pay | Admitting: Cardiology

## 2018-05-09 VITALS — BP 112/84 | HR 79 | Ht 68.0 in | Wt 193.6 lb

## 2018-05-09 DIAGNOSIS — I422 Other hypertrophic cardiomyopathy: Secondary | ICD-10-CM | POA: Diagnosis not present

## 2018-05-09 DIAGNOSIS — R0602 Shortness of breath: Secondary | ICD-10-CM

## 2018-05-09 NOTE — Patient Instructions (Signed)
Medication Instructions:  Continue current medications  If you need a refill on your cardiac medications before your next appointment, please call your pharmacy.  Labwork: None Ordered   Testing/Procedures: None Ordered   Follow-Up: Your physician wants you to follow-up in: 6 Months You should receive a reminder letter in the mail two months in advance. If you do not receive a letter, please call our office in [phone] to schedule your follow-up appointment.     Thank you for choosing [facility] at [facility]!!       

## 2018-08-24 ENCOUNTER — Ambulatory Visit: Payer: Medicare Other | Admitting: Family Medicine

## 2018-08-28 ENCOUNTER — Encounter: Payer: Self-pay | Admitting: Family Medicine

## 2018-08-28 ENCOUNTER — Ambulatory Visit: Payer: Medicare Other | Admitting: Family Medicine

## 2018-08-28 VITALS — BP 128/62 | HR 82 | Temp 98.5°F | Resp 18 | Ht 68.0 in | Wt 194.2 lb

## 2018-08-28 DIAGNOSIS — E782 Mixed hyperlipidemia: Secondary | ICD-10-CM

## 2018-08-28 DIAGNOSIS — I10 Essential (primary) hypertension: Secondary | ICD-10-CM

## 2018-08-28 DIAGNOSIS — R739 Hyperglycemia, unspecified: Secondary | ICD-10-CM

## 2018-08-28 DIAGNOSIS — M25511 Pain in right shoulder: Secondary | ICD-10-CM

## 2018-08-28 DIAGNOSIS — M25512 Pain in left shoulder: Secondary | ICD-10-CM

## 2018-08-28 MED ORDER — PRAVASTATIN SODIUM 40 MG PO TABS: 40.0000 mg | ORAL_TABLET | Freq: Every day | ORAL | 1 refills | Status: AC

## 2018-08-28 NOTE — Assessment & Plan Note (Signed)
Encouraged heart healthy diet, increase exercise, avoid trans fats, consider a krill oil cap daily 

## 2018-08-28 NOTE — Assessment & Plan Note (Signed)
Well controlled, no changes to meds. Encouraged heart healthy diet such as the DASH diet and exercise as tolerated.  °

## 2018-08-28 NOTE — Patient Instructions (Addendum)
Prevnar is the PCV 13 shot you need  Hypertension Hypertension, commonly called high blood pressure, is when the force of blood pumping through the arteries is too strong. The arteries are the blood vessels that carry blood from the heart throughout the body. Hypertension forces the heart to work harder to pump blood and may cause arteries to become narrow or stiff. Having untreated or uncontrolled hypertension can cause heart attacks, strokes, kidney disease, and other problems. A blood pressure reading consists of a higher number over a lower number. Ideally, your blood pressure should be below 120/80. The first ("top") number is called the systolic pressure. It is a measure of the pressure in your arteries as your heart beats. The second ("bottom") number is called the diastolic pressure. It is a measure of the pressure in your arteries as the heart relaxes. What are the causes? The cause of this condition is not known. What increases the risk? Some risk factors for high blood pressure are under your control. Others are not. Factors you can change  Smoking.  Having type 2 diabetes mellitus, high cholesterol, or both.  Not getting enough exercise or physical activity.  Being overweight.  Having too much fat, sugar, calories, or salt (sodium) in your diet.  Drinking too much alcohol. Factors that are difficult or impossible to change  Having chronic kidney disease.  Having a family history of high blood pressure.  Age. Risk increases with age.  Race. You may be at higher risk if you are African-American.  Gender. Men are at higher risk than women before age 56. After age 62, women are at higher risk than men.  Having obstructive sleep apnea.  Stress. What are the signs or symptoms? Extremely high blood pressure (hypertensive crisis) may cause:  Headache.  Anxiety.  Shortness of breath.  Nosebleed.  Nausea and vomiting.  Severe chest pain.  Jerky movements you  cannot control (seizures).  How is this diagnosed? This condition is diagnosed by measuring your blood pressure while you are seated, with your arm resting on a surface. The cuff of the blood pressure monitor will be placed directly against the skin of your upper arm at the level of your heart. It should be measured at least twice using the same arm. Certain conditions can cause a difference in blood pressure between your right and left arms. Certain factors can cause blood pressure readings to be lower or higher than normal (elevated) for a short period of time:  When your blood pressure is higher when you are in a health care provider's office than when you are at home, this is called white coat hypertension. Most people with this condition do not need medicines.  When your blood pressure is higher at home than when you are in a health care provider's office, this is called masked hypertension. Most people with this condition may need medicines to control blood pressure.  If you have a high blood pressure reading during one visit or you have normal blood pressure with other risk factors:  You may be asked to return on a different day to have your blood pressure checked again.  You may be asked to monitor your blood pressure at home for 1 week or longer.  If you are diagnosed with hypertension, you may have other blood or imaging tests to help your health care provider understand your overall risk for other conditions. How is this treated? This condition is treated by making healthy lifestyle changes, such as eating healthy  foods, exercising more, and reducing your alcohol intake. Your health care provider may prescribe medicine if lifestyle changes are not enough to get your blood pressure under control, and if:  Your systolic blood pressure is above 130.  Your diastolic blood pressure is above 80.  Your personal target blood pressure may vary depending on your medical conditions, your age,  and other factors. Follow these instructions at home: Eating and drinking  Eat a diet that is high in fiber and potassium, and low in sodium, added sugar, and fat. An example eating plan is called the DASH (Dietary Approaches to Stop Hypertension) diet. To eat this way: ? Eat plenty of fresh fruits and vegetables. Try to fill half of your plate at each meal with fruits and vegetables. ? Eat whole grains, such as whole wheat pasta, brown rice, or whole grain bread. Fill about one quarter of your plate with whole grains. ? Eat or drink low-fat dairy products, such as skim milk or low-fat yogurt. ? Avoid fatty cuts of meat, processed or cured meats, and poultry with skin. Fill about one quarter of your plate with lean proteins, such as fish, chicken without skin, beans, eggs, and tofu. ? Avoid premade and processed foods. These tend to be higher in sodium, added sugar, and fat.  Reduce your daily sodium intake. Most people with hypertension should eat less than 1,500 mg of sodium a day.  Limit alcohol intake to no more than 1 drink a day for nonpregnant women and 2 drinks a day for men. One drink equals 12 oz of beer, 5 oz of wine, or 1 oz of hard liquor. Lifestyle  Work with your health care provider to maintain a healthy body weight or to lose weight. Ask what an ideal weight is for you.  Get at least 30 minutes of exercise that causes your heart to beat faster (aerobic exercise) most days of the week. Activities may include walking, swimming, or biking.  Include exercise to strengthen your muscles (resistance exercise), such as pilates or lifting weights, as part of your weekly exercise routine. Try to do these types of exercises for 30 minutes at least 3 days a week.  Do not use any products that contain nicotine or tobacco, such as cigarettes and e-cigarettes. If you need help quitting, ask your health care provider.  Monitor your blood pressure at home as told by your health care  provider.  Keep all follow-up visits as told by your health care provider. This is important. Medicines  Take over-the-counter and prescription medicines only as told by your health care provider. Follow directions carefully. Blood pressure medicines must be taken as prescribed.  Do not skip doses of blood pressure medicine. Doing this puts you at risk for problems and can make the medicine less effective.  Ask your health care provider about side effects or reactions to medicines that you should watch for. Contact a health care provider if:  You think you are having a reaction to a medicine you are taking.  You have headaches that keep coming back (recurring).  You feel dizzy.  You have swelling in your ankles.  You have trouble with your vision. Get help right away if:  You develop a severe headache or confusion.  You have unusual weakness or numbness.  You feel faint.  You have severe pain in your chest or abdomen.  You vomit repeatedly.  You have trouble breathing. Summary  Hypertension is when the force of blood pumping through your  arteries is too strong. If this condition is not controlled, it may put you at risk for serious complications.  Your personal target blood pressure may vary depending on your medical conditions, your age, and other factors. For most people, a normal blood pressure is less than 120/80.  Hypertension is treated with lifestyle changes, medicines, or a combination of both. Lifestyle changes include weight loss, eating a healthy, low-sodium diet, exercising more, and limiting alcohol. This information is not intended to replace advice given to you by your health care provider. Make sure you discuss any questions you have with your health care provider. Document Released: 09/05/2005 Document Revised: 08/03/2016 Document Reviewed: 08/03/2016 Elsevier Interactive Patient Education  [employer].

## 2018-08-28 NOTE — Assessment & Plan Note (Signed)
hgba1c acceptable, minimize simple carbs. Increase exercise as tolerated. Continue current meds 

## 2018-09-01 NOTE — Progress Notes (Signed)
Subjective:    Patient ID: Madeline [name], female    DOB: 1946-05-14, 72 y.o.   MRN: [id number]  No chief complaint on file.   HPI Patient is in today for follow up. She is doing well mostly. No recent febrile illness or acute hospitalizations. She is having more and more trouble with her shoulders. She has had trouble with pain and decreased ROM in her right shoulder for years but it is worsening and now has more pain and trouble with left shoulder also. No falls or trauma. Does note some tingling in her fingertips as well. Discomfort significant enough to wake her at night. She notes an occasional palpiation but it is fleeting and without associated symptoms. SOB with exertion is stable. Denies CPHA/congestion/fevers/GI or GU c/o. Taking meds as prescribed  Past Medical History:  Diagnosis Date  . Anxiety   . Arthritis 09-14-11   osteoarthritis, osteopenia  . Cataracts, both eyes 09-13-11   not surgical yet  . Cholelithiasis 05/17/2016  . Chronic neck pain 05/29/2016  . Cystitis 2007   sepsis post bladder biopsy, Dr Jeffie Pollock  . Fibromyalgia 09-14-11   Dr Donney Dice, [facility]  . Fractures 09/14/2011   toes-left foot  . GERD (gastroesophageal reflux disease) 09-14-11   tx. Nexium  . H/O foot surgery 12/19/2011  . Hiatal hernia 05/17/2016  . Hyperglycemia 08/23/2016  . Hyperlipidemia   . Hypertension 09-13-11   tx. meds  . 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 Left ventricular hypertrophy 05/15/2017  . Muscle cramping 10/23/2017  . TMJ click [id number]/8768   right side > left  . 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 ABDOMINAL HYSTERECTOMY  1977   dysfunctional menses; age 29  . APPENDECTOMY  1963  . COLONOSCOPY  2011   negative  . CYSTOSTOMY W/ BLADDER BIOPSY  04-2006  . FOOT ARTHRODESIS  01/03/12   [facility]-- left hallux MP joint arthrodesis  .  HAMMER TOE SURGERY  01/03/12   [facility], Dr Doran Durand  . NASAL SINUS SURGERY  09-14-11   right side " sinus lift"  . SIGMOIDOSCOPY    . sling procedure  2006   a and p repair  . TONSILLECTOMY    . TOTAL KNEE ARTHROPLASTY  09/21/2011/[facility]   Procedure: TOTAL KNEE ARTHROPLASTY;  Surgeon: Gearlean Alf;  Location: [facility];  Service: Orthopedics;  Laterality: Left;  . TOTAL KNEE ARTHROPLASTY  06/25/2012   Procedure: TOTAL KNEE ARTHROPLASTY;  Surgeon: Gearlean Alf, MD;  Location: [facility];  Service: Orthopedics;  Laterality: Right;    Family History  Problem Relation Age of Onset  . Aortic aneurysm Father        femoral aneurysm  . Aortic dissection Father   . Mental illness Brother        paranoid schizophrenia  . Pneumonia Brother        pneumothorax in [place]  . Heart attack Unknown        both GF;PGM; M uncle  . Stroke Paternal Aunt        aneurysm  . Colon cancer Paternal Aunt        X 2  . Other Mother        lung tumor which resolved w/o treatment  . Hypertension Mother   . Other Maternal Grandmother        enalraged heart  .  Hypertension Maternal Grandmother   . Heart attack Maternal Grandfather        In fifties, died.  . Hypertension Maternal Grandfather   . Heart attack Paternal Grandmother        In forties.  . Hypertension Paternal Grandmother   . Heart attack Paternal Grandfather   . Heart disease Paternal Grandfather   . Hypertension Paternal Grandfather   . Hypertension Daughter   . Supraventricular tachycardia Daughter   . Hypertension Son   . AAA (abdominal aortic aneurysm) Son   . Diabetes Neg Hx     Social History   Socioeconomic History  . Marital status: Married    Spouse name: Not on file  . Number of children: 2  . Years of education: Not on file  . Highest education level: Not on file  Occupational History  . Not on file  Social Needs  . Financial resource strain: Not on file  . Food insecurity:    Worry: Not on file    Inability: Not on  file  . Transportation needs:    Medical: Not on file    Non-medical: Not on file  Tobacco Use  . Smoking status: Never Smoker  . Smokeless tobacco: Never Used  Substance and Sexual Activity  . Alcohol use: Not Currently  . Drug use: No  . Sexual activity: Not Currently  Lifestyle  . Physical activity:    Days per week: Not on file    Minutes per session: Not on file  . Stress: Not on file  Relationships  . Social connections:    Talks on phone: Not on file    Gets together: Not on file    Attends religious service: Not on file    Active member of club or organization: Not on file    Attends meetings of clubs or organizations: Not on file    Relationship status: Not on file  . Intimate partner violence:    Fear of current or ex partner: Not on file    Emotionally abused: Not on file    Physically abused: Not on file    Forced sexual activity: Not on file  Other Topics Concern  . Not on file  Social History Narrative   Reg exercise 2 x weekly with trainer, no dietary restrictions. Trying to eat heart healthy.       Retired from [employer].    Outpatient Medications Prior to Visit  Medication Sig Dispense Refill  . aspirin EC 81 MG tablet Take 81 mg by mouth daily.    Marland Kitchen CALCIUM PO Take 600 mg by mouth 2 (two) times daily.     . Cholecalciferol (VITAMIN D3) 2000 UNITS TABS Take 2,000 Units by mouth daily.     . cyclobenzaprine (FLEXERIL) 10 MG tablet Take 20 mg by mouth at bedtime.    . DULoxetine (CYMBALTA) 60 MG capsule Take 60 mg by mouth daily.    . fosinopril (MONOPRIL) 10 MG tablet Take 0.5 tablets (5 mg total) by mouth daily. 45 tablet 3  . furosemide (LASIX) 20 MG tablet Take 20 mg by mouth as needed.    . Multiple Vitamin (MULTI-DAY PO) Take by mouth daily.    Marland Kitchen oxyCODONE (OXY IR/ROXICODONE) 5 MG immediate release tablet Take 1 tablet by mouth as needed.    . tizanidine (ZANAFLEX) 2 MG capsule Take 4 mg by mouth at bedtime.    . traMADol (ULTRAM) 50 MG tablet Take  2 tablets by mouth every 6 (six) hours  as needed. Not to exceed 6 tablets a day    . pravastatin (PRAVACHOL) 40 MG tablet Take 1 tablet (40 mg total) by mouth at bedtime. 90 tablet 1  . ranitidine (ZANTAC) 150 MG capsule Take 150 mg by mouth 2 (two) times daily.     . metoprolol succinate (TOPROL-XL) 50 MG 24 hr tablet Take 1 tablet (50 mg total) by mouth 2 (two) times daily. Take with or immediately following a meal. 180 tablet 3   No facility-administered medications prior to visit.     Allergies  Allergen Reactions  . Ivp Dye [Iodinated Diagnostic Agents]     Rash , hives  . Codeine Itching    Unless something given to counteract itching    Review of Systems  Constitutional: Positive for malaise/fatigue. Negative for fever.  HENT: Negative for congestion.   Eyes: Negative for blurred vision.  Respiratory: Positive for shortness of breath.   Cardiovascular: Positive for palpitations. Negative for chest pain and leg swelling.  Gastrointestinal: Negative for abdominal pain, blood in stool and nausea.  Genitourinary: Negative for dysuria and frequency.  Musculoskeletal: Positive for joint pain. Negative for falls.  Skin: Negative for rash.  Neurological: Negative for dizziness, loss of consciousness and headaches.  Endo/Heme/Allergies: Negative for environmental allergies.  Psychiatric/Behavioral: Negative for depression. The patient is not nervous/anxious.        Objective:    Physical Exam Vitals signs and nursing note reviewed.  Constitutional:      General: She is not in acute distress.    Appearance: She is well-developed.  HENT:     Head: Normocephalic and atraumatic.     Nose: Nose normal.  Eyes:     General:        Right eye: No discharge.        Left eye: No discharge.  Neck:     Musculoskeletal: Normal range of motion and neck supple.  Cardiovascular:     Rate and Rhythm: Normal rate and regular rhythm.     Heart sounds: No murmur.  Pulmonary:     Effort:  Pulmonary effort is normal.     Breath sounds: Normal breath sounds.  Abdominal:     General: Bowel sounds are normal.     Palpations: Abdomen is soft.     Tenderness: There is no abdominal tenderness.  Skin:    General: Skin is warm and dry.  Neurological:     Mental Status: She is alert and oriented to person, place, and time.     BP 128/62 (BP Location: Left Arm, Patient Position: Sitting, Cuff Size: Large)   Pulse 82   Temp 98.5 F (36.9 C) (Oral)   Resp 18   Ht 5\' 8"  (1.727 m)   Wt 194 lb 3.2 oz (88.1 kg)   SpO2 97%   BMI 29.53 kg/m  Wt Readings from Last 3 Encounters:  08/28/18 194 lb 3.2 oz (88.1 kg)  05/09/18 193 lb 9.6 oz (87.8 kg)  04/26/18 196 lb (88.9 kg)     Lab Results  Component Value Date   WBC 7.1 02/22/2018   HGB 13.3 02/22/2018   HCT 39.9 02/22/2018   PLT 237.0 02/22/2018   GLUCOSE 99 02/22/2018   CHOL 189 02/22/2018   TRIG 120.0 02/22/2018   HDL 64.20 02/22/2018   LDLCALC 101 (H) 02/22/2018   ALT 9 02/22/2018   AST 16 02/22/2018   NA 137 02/22/2018   K 4.4 02/22/2018   CL 101 02/22/2018   CREATININE  0.73 02/22/2018   BUN 15 02/22/2018   CO2 31 02/22/2018   TSH 2.77 02/22/2018   INR 1.02 06/15/2012   HGBA1C 5.7 02/22/2018    Lab Results  Component Value Date   TSH 2.77 02/22/2018   Lab Results  Component Value Date   WBC 7.1 02/22/2018   HGB 13.3 02/22/2018   HCT 39.9 02/22/2018   MCV 88.9 02/22/2018   PLT 237.0 02/22/2018   Lab Results  Component Value Date   NA 137 02/22/2018   K 4.4 02/22/2018   CO2 31 02/22/2018   GLUCOSE 99 02/22/2018   BUN 15 02/22/2018   CREATININE 0.73 02/22/2018   BILITOT 0.5 02/22/2018   ALKPHOS 82 02/22/2018   AST 16 02/22/2018   ALT 9 02/22/2018   PROT 6.6 02/22/2018   ALBUMIN 4.3 02/22/2018   CALCIUM 9.8 02/22/2018   ANIONGAP 9 07/18/2016   GFR 83.25 02/22/2018   Lab Results  Component Value Date   CHOL 189 02/22/2018   Lab Results  Component Value Date   HDL 64.20 02/22/2018    Lab Results  Component Value Date   LDLCALC 101 (H) 02/22/2018   Lab Results  Component Value Date   TRIG 120.0 02/22/2018   Lab Results  Component Value Date   CHOLHDL 3 02/22/2018   Lab Results  Component Value Date   HGBA1C 5.7 02/22/2018       Assessment & Plan:   Problem List Items Addressed This Visit    Hyperlipidemia    Encouraged heart healthy diet, increase exercise, avoid trans fats, consider a krill oil cap daily      Relevant Medications   pravastatin (PRAVACHOL) 40 MG tablet   Other Relevant Orders   Lipid panel   Essential hypertension    Well controlled, no changes to meds. Encouraged heart healthy diet such as the DASH diet and exercise as tolerated.       Relevant Medications   pravastatin (PRAVACHOL) 40 MG tablet   Other Relevant Orders   CBC   Comprehensive metabolic panel   TSH   Hyperglycemia    hgba1c acceptable, minimize simple carbs. Increase exercise as tolerated. Continue current meds      Relevant Orders   Hemoglobin A1c   Shoulder pain, bilateral - Primary    Encouraged moist heat and gentle stretching as tolerated. May try NSAIDs and prescription meds as directed and report if symptoms worsen or seek immediate care. Referred to physical therapy for evaluation and treatment has worked with Lum Babe in past and would like to return to work with him again      Relevant Orders   Ambulatory referral to Orthopedic Surgery      I have discontinued Breanda Greenlaw. Tonne's ranitidine. I am also having her maintain her Vitamin D3, Multiple Vitamin (MULTI-DAY PO), CALCIUM PO, tizanidine, cyclobenzaprine, aspirin EC, traMADol, DULoxetine, oxyCODONE, fosinopril, metoprolol succinate, furosemide, and pravastatin.  Meds ordered this encounter  Medications  . pravastatin (PRAVACHOL) 40 MG tablet    Sig: Take 1 tablet (40 mg total) by mouth at bedtime.    Dispense:  90 tablet    Refill:  1    Penni Homans, MD

## 2018-09-01 NOTE — Assessment & Plan Note (Signed)
Encouraged moist heat and gentle stretching as tolerated. May try NSAIDs and prescription meds as directed and report if symptoms worsen or seek immediate care. Referred to physical therapy for evaluation and treatment has worked with Madeline [name] in past and would like to return to work with him again

## 2018-10-18 ENCOUNTER — Telehealth: Payer: Self-pay | Admitting: *Deleted

## 2018-10-18 NOTE — Telephone Encounter (Signed)
Received Physician Orders from C. Jeannie Done, DDS, MS, PA regarding premedicating; forwarded to provider/SLS 01/30

## 2018-11-11 NOTE — Progress Notes (Signed)
Cardiology Office Note   Date:  11/12/2018   ID:  Madeline [name], DOB 1945-11-09, MRN [id number]  PCP:  Mosie Lukes, MD  Cardiologist:   Minus Breeding, MD  Referring:  Mosie Lukes, MD .  Chief Complaint  Patient presents with  . Cardiomyopathy      History of Present Illness: Madeline [name] is a 73 y.o. female who presents for follow up of dyspnea.  She has had a POET (Plain Old Exercise Treadmill) which was negative for evidence of ischemia.   She did have a past history of syncope and a slightly abnormal EKG.  I had her wear a monitor that demonstrated no arrhythmias.  Echo demonstrated significant LVH on echo with septal hypertrophy. MRI confirmed severe basal septal hypertrophy without uptake of gad.   At a previous visit she had dyspnea and she I switched her from Cardizem to verapamil.   Unfortunately the verapamil caused constipation.  She did not see any improvement in her dyspnea.  I eventually stopped the calcium channel blocker and started beta blocker and have subsequently titrated the dose.   She returns today and she is doing relatively well.  She has chronic dyspnea.  This has been baseline however.  She is only short of breath if she does significant things like housework.  She is not having any resting shortness of breath, PND or orthopnea.  She is not having any new palpitations, presyncope or syncope.  She keeps an extensive blood pressure diary and her systolics are typically well controlled.  The diastolic seem to be slightly elevated.  She had 1 day in early February when she had hypotension but she is not had any further syncope although that day she felt a little lightheaded.   Past Medical History:  Diagnosis Date  . Anxiety   . Arthritis 09-14-11   osteoarthritis, osteopenia  . Cataracts, both eyes 09-13-11   not surgical yet  . Cholelithiasis 05/17/2016  . Chronic neck pain 05/29/2016  . Cystitis 2007   sepsis post bladder biopsy, Dr Jeffie Pollock    . Fibromyalgia 09-14-11   Dr Donney Dice, [facility]  . Fractures 09/14/2011   toes-left foot  . GERD (gastroesophageal reflux disease) 09-14-11   tx. Nexium  . H/O foot surgery 12/19/2011  . Hiatal hernia 05/17/2016  . Hyperglycemia 08/23/2016  . Hyperlipidemia   . Hypertension 09-13-11   tx. meds  . 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 Left ventricular hypertrophy 05/15/2017  . Muscle cramping 10/23/2017  . TMJ click 84/16/6063   right side > left  . 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 ABDOMINAL HYSTERECTOMY  1977   dysfunctional menses; age 72  . APPENDECTOMY  1963  . COLONOSCOPY  2011   negative  . CYSTOSTOMY W/ BLADDER BIOPSY  04-2006  . FOOT ARTHRODESIS  01/03/12   [facility]-- left hallux MP joint arthrodesis  . HAMMER TOE SURGERY  01/03/12   [facility], Dr Doran Durand  . NASAL SINUS SURGERY  09-14-11   right side " sinus lift"  . SIGMOIDOSCOPY    . sling procedure  2006   a and p repair  . TONSILLECTOMY    . TOTAL KNEE ARTHROPLASTY  09/21/2011/BIL   Procedure: TOTAL KNEE ARTHROPLASTY;  Surgeon: Gearlean Alf;  Location: [facility];  Service: Orthopedics;  Laterality: Left;  .  TOTAL KNEE ARTHROPLASTY  06/25/2012   Procedure: TOTAL KNEE ARTHROPLASTY;  Surgeon: Gearlean Alf, MD;  Location: [facility];  Service: Orthopedics;  Laterality: Right;     Current Outpatient Medications  Medication Sig Dispense Refill  . aspirin EC 81 MG tablet Take 81 mg by mouth daily.    Marland Kitchen CALCIUM PO Take 600 mg by mouth 2 (two) times daily.     . Cholecalciferol (VITAMIN D3) 2000 UNITS TABS Take 2,000 Units by mouth daily.     . cyclobenzaprine (FLEXERIL) 10 MG tablet Take 20 mg by mouth at bedtime.    . DULoxetine (CYMBALTA) 60 MG capsule Take 60 mg by mouth daily.    . fosinopril (MONOPRIL) 10 MG tablet Take 0.5 tablets (5 mg total) by mouth daily. 45 tablet 3  .  furosemide (LASIX) 20 MG tablet Take 20 mg by mouth as needed.    . Multiple Vitamin (MULTI-DAY PO) Take by mouth daily.    . pravastatin (PRAVACHOL) 40 MG tablet Take 1 tablet (40 mg total) by mouth at bedtime. 90 tablet 1  . tizanidine (ZANAFLEX) 2 MG capsule Take 4 mg by mouth at bedtime.    . traMADol (ULTRAM) 50 MG tablet Take 2 tablets by mouth every 6 (six) hours as needed. Not to exceed 6 tablets a day    . metoprolol succinate (TOPROL-XL) 50 MG 24 hr tablet Take 1 tablet (50 mg total) by mouth 2 (two) times daily. Take with or immediately following a meal. 180 tablet 3   No current facility-administered medications for this visit.     Allergies:   Ivp dye [iodinated diagnostic agents] and Codeine    ROS:  Please see the history of present illness.   Otherwise, review of systems are positive for none.   All other systems are reviewed and negative.    PHYSICAL EXAM: VS:  BP 126/84   Pulse 71   Ht 5\' 8"  (1.727 m)   Wt 205 lb (93 kg)   BMI 31.17 kg/m  , BMI Body mass index is 31.17 kg/m.  GENERAL:  Well appearing NECK:  No jugular venous distention, waveform within normal limits, carotid upstroke brisk and symmetric, no bruits, no thyromegaly LUNGS:  Clear to auscultation bilaterally CHEST:  Unremarkable HEART:  PMI not displaced or sustained,S1 and S2 within normal limits, no S3, no S4, no clicks, no rubs, no murmurs ABD:  Flat, positive bowel sounds normal in frequency in pitch, no bruits, no rebound, no guarding, no midline pulsatile mass, no hepatomegaly, no splenomegaly EXT:  2 plus pulses throughout, no edema, no cyanosis no clubbing   EKG:  EKG is  ordered today. Normal sinus rhythm, rate 71, axis within normal limits, intervals within normal limits, no acute ST-T wave changes.   Recent Labs: 02/22/2018: ALT 9; BUN 15; Creatinine, Ser 0.73; Hemoglobin 13.3; Platelets 237.0; Potassium 4.4; Sodium 137; TSH 2.77    Lipid Panel    Component Value Date/Time   CHOL 189  02/22/2018 1006   TRIG 120.0 02/22/2018 1006   HDL 64.20 02/22/2018 1006   CHOLHDL 3 02/22/2018 1006   VLDL 24.0 02/22/2018 1006   LDLCALC 101 (H) 02/22/2018 1006      Wt Readings from Last 3 Encounters:  11/12/18 205 lb (93 kg)  08/28/18 194 lb 3.2 oz (88.1 kg)  05/09/18 193 lb 9.6 oz (87.8 kg)      Other studies Reviewed: Additional studies/ records that were reviewed today include:  BP recording Review of  the above records demonstrates:   See elsewhere   ASSESSMENT AND PLAN:  SEPTAL HYPERTROPHY:      She has had no high risk findings.    She is had a treadmill, monitor and now the MRI.  I am managing this medically.  At this point no change in therapy.  I will follow her clinically.  DYSPNEA:   This is improved compared to previous and for now she will continue the meds as listed.  She understands need for salt and fluid management and blood pressure control.   QT:     This was not prolonged on recent EKGs.  No change in therapy.  This was not prolonged on the last EKG.   SYNCOPE:   No further episodes.   HTN:  The blood pressure is at target systolic although the diastolic seems to be slightly elevated. No change in medications is indicated. We will continue with therapeutic lifestyle changes (TLC).    ILIAC ANEURYSM:   Stable left common iliac artery aneurysm, measuring 1.6 cm AP x 1.8 cm TRV.  I will follow this up with repeat Dopplers probably in June 2021.   Current medicines are reviewed at length with the patient today.  The patient does not have concerns regarding medicines.  The following changes have been made:   None  Labs/ tests ordered today include:   None Orders Placed This Encounter  Procedures  . EKG 12-Lead     Disposition:   FU with me in [place] in six months.    Signed, Minus Breeding, MD  11/12/2018 12:35 PM    [facility]

## 2018-11-12 ENCOUNTER — Encounter: Payer: Self-pay | Admitting: Cardiology

## 2018-11-12 ENCOUNTER — Ambulatory Visit: Payer: Medicare Other | Admitting: Cardiology

## 2018-11-12 VITALS — BP 126/84 | HR 71 | Ht 68.0 in | Wt 205.0 lb

## 2018-11-12 DIAGNOSIS — I700 Atherosclerosis of aorta: Secondary | ICD-10-CM | POA: Diagnosis not present

## 2018-11-12 DIAGNOSIS — I422 Other hypertrophic cardiomyopathy: Secondary | ICD-10-CM | POA: Diagnosis not present

## 2018-11-12 NOTE — Patient Instructions (Signed)
Medication Instructions:  Continue current medications  If you need a refill on your cardiac medications before your next appointment, please call your pharmacy.  Labwork: None Ordered   Testing/Procedures: None Ordered  Follow-Up: You will need a follow up appointment in 6 months.  Please call our office 2 months in advance to schedule this appointment.  You may see one of the following Advanced Practice Providers on your designated Care Team:    . Jory Sims, DNP, ANP    At [facility], you and your health needs are our priority.  As part of our continuing mission to provide you with exceptional heart care, we have created designated Provider Care Teams.  These Care Teams include your primary Cardiologist (physician) and Advanced Practice Providers (APPs -  Physician Assistants and Nurse Practitioners) who all work together to provide you with the care you need, when you need it.  Thank you for choosing [facility] at [facility]!!

## 2018-12-07 ENCOUNTER — Ambulatory Visit: Payer: Medicare Other | Admitting: Neurology

## 2019-01-21 ENCOUNTER — Other Ambulatory Visit (HOSPITAL_COMMUNITY): Payer: Self-pay | Admitting: Cardiology

## 2019-01-21 DIAGNOSIS — I723 Aneurysm of iliac artery: Secondary | ICD-10-CM

## 2019-02-22 NOTE — Progress Notes (Addendum)
Virtual Visit via Video Note  I connected with patient on 02/25/19 at 10:00 AM EDT by audio enabled telemedicine application and verified that I am speaking with the correct person using two identifiers.   THIS ENCOUNTER IS A VIRTUAL VISIT DUE TO COVID-19 - PATIENT WAS NOT SEEN IN THE OFFICE. PATIENT HAS CONSENTED TO VIRTUAL VISIT / TELEMEDICINE VISIT   Location of patient: home  Location of provider: office  I discussed the limitations of evaluation and management by telemedicine and the availability of in person appointments. The patient expressed understanding and agreed to proceed.   Subjective:   Madeline [name] is a 73 y.o. female who presents for Medicare Annual (Subsequent) preventive examination.  Enjoys reading her bible.   Review of Systems: No ROS.  Medicare Wellness Virtual Visit.  Visual/audio telehealth visit, UTA vital signs.   See social history for additional risk factors. Cardiac Risk Factors include: advanced age (>29men, >77 women);dyslipidemia;hypertension Sleep patterns: no issues Home Safety/Smoke Alarms: Feels safe in home. Smoke alarms in place.  Lives with husband in 2 story home.  Female:       Mammo-03/01/18       Dexa scan- 08/29/16       CCS- 04/26/18. 5 yr recall     Objective:     Advanced Directives 02/25/2019 02/22/2018 02/17/2017 01/17/2017 01/17/2017 08/23/2016 07/18/2016  Does Patient Have a Medical Advance Directive? Yes Yes Yes - No;Yes No No  Type of Paramedic of [place];Living will [place];Living will [place];Living will - [place];Living will - -  Does patient want to make changes to medical advance directive? No - Patient declined - No - Patient declined - No - Patient declined - -  Copy of [place] in Chart? No - copy requested No - copy requested No - copy requested No - copy requested - - -  Would patient like information on creating a  medical advance directive? - - - No - Patient declined No - Patient declined No - Patient declined No - patient declined information  Pre-existing out of facility DNR order (yellow form or pink MOST form) - - - - - - -    Tobacco Social History   Tobacco Use  Smoking Status Never Smoker  Smokeless Tobacco Never Used     Counseling given: Not Answered   Clinical Intake: Pain : No/denies pain    Past Medical History:  Diagnosis Date  . Anxiety   . Arthritis 09-14-11   osteoarthritis, osteopenia  . Cataracts, both eyes 09-13-11   not surgical yet  . Cholelithiasis 05/17/2016  . Chronic neck pain 05/29/2016  . Cystitis 2007   sepsis post bladder biopsy, Dr Jeffie Pollock  . Fibromyalgia 09-14-11   Dr Donney Dice, [facility]  . Fractures 09/14/2011   toes-left foot  . GERD (gastroesophageal reflux disease) 09-14-11   tx. Nexium  . H/O foot surgery 12/19/2011  . Hiatal hernia 05/17/2016  . Hyperglycemia 08/23/2016  . Hyperlipidemia   . Hypertension 09-13-11   tx. meds  . 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 Left ventricular hypertrophy 05/15/2017  . Muscle cramping 10/23/2017  . TMJ click 02/40/9735   right side > left  . 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 ABDOMINAL HYSTERECTOMY  1977   dysfunctional menses; age 80  .  APPENDECTOMY  1963  . COLONOSCOPY  2011   negative  . CYSTOSTOMY W/ BLADDER BIOPSY  04-2006  . FOOT ARTHRODESIS  01/03/12   [facility]-- left hallux MP joint arthrodesis  . HAMMER TOE SURGERY  01/03/12   [facility], Dr Doran Durand  . NASAL SINUS SURGERY  09-14-11   right side " sinus lift"  . SIGMOIDOSCOPY    . sling procedure  2006   a and p repair  . TONSILLECTOMY    . TOTAL KNEE ARTHROPLASTY  09/21/2011/BIL   Procedure: TOTAL KNEE ARTHROPLASTY;  Surgeon: Gearlean Alf;  [facility];  Service: Orthopedics;  Laterality: Left;   . TOTAL KNEE ARTHROPLASTY  06/25/2012   Procedure: TOTAL KNEE ARTHROPLASTY;  Surgeon: Gearlean Alf, MD;  Location: [facility];  Service: Orthopedics;  Laterality: Right;   Family History  Problem Relation Age of Onset  . Aortic aneurysm Father        femoral aneurysm  . Aortic dissection Father   . Mental illness Brother        paranoid schizophrenia  . Pneumonia Brother        pneumothorax in [place]  . Heart attack Other        both GF;PGM; M uncle  . Stroke Paternal Aunt        aneurysm  . Colon cancer Paternal Aunt        X 2  . Other Mother        lung tumor which resolved w/o treatment  . Hypertension Mother   . Other Maternal Grandmother        enalraged heart  . Hypertension Maternal Grandmother   . Heart attack Maternal Grandfather        In fifties, died.  . Hypertension Maternal Grandfather   . Heart attack Paternal Grandmother        In forties.  . Hypertension Paternal Grandmother   . Heart attack Paternal Grandfather   . Heart disease Paternal Grandfather   . Hypertension Paternal Grandfather   . Hypertension Daughter   . Supraventricular tachycardia Daughter   . Hypertension Son   . AAA (abdominal aortic aneurysm) Son   . Diabetes Neg Hx    Social History   Socioeconomic History  . Marital status: Married    Spouse name: Not on file  . Number of children: 2  . Years of education: Not on file  . Highest education level: Not on file  Occupational History  . Not on file  Social Needs  . Financial resource strain: Not on file  . Food insecurity:    Worry: Not on file    Inability: Not on file  . Transportation needs:    Medical: Not on file    Non-medical: Not on file  Tobacco Use  . Smoking status: Never Smoker  . Smokeless tobacco: Never Used  Substance and Sexual Activity  . Alcohol use: Not Currently  . Drug use: No  . Sexual activity: Not Currently  Lifestyle  . Physical activity:    Days per week: Not on file    Minutes per session:  Not on file  . Stress: Not on file  Relationships  . Social connections:    Talks on phone: Not on file    Gets together: Not on file    Attends religious service: Not on file    Active member of club or organization: Not on file    Attends meetings of clubs or organizations: Not on file  Relationship status: Not on file  Other Topics Concern  . Not on file  Social History Narrative   Reg exercise 2 x weekly with trainer, no dietary restrictions. Trying to eat heart healthy.       Retired from [employer].    Outpatient Encounter Medications as of 02/25/2019  Medication Sig  . aspirin EC 81 MG tablet Take 81 mg by mouth daily.  Marland Kitchen CALCIUM PO Take 600 mg by mouth 2 (two) times daily.   . Cholecalciferol (VITAMIN D3) 2000 UNITS TABS Take 2,000 Units by mouth daily.   . cyclobenzaprine (FLEXERIL) 10 MG tablet Take 20 mg by mouth at bedtime.  . DULoxetine (CYMBALTA) 60 MG capsule Take 60 mg by mouth daily.  . fosinopril (MONOPRIL) 10 MG tablet Take 0.5 tablets (5 mg total) by mouth daily.  . furosemide (LASIX) 20 MG tablet Take 20 mg by mouth as needed.  . Multiple Vitamin (MULTI-DAY PO) Take by mouth daily.  . pravastatin (PRAVACHOL) 40 MG tablet Take 1 tablet (40 mg total) by mouth at bedtime.  . tizanidine (ZANAFLEX) 2 MG capsule Take 4 mg by mouth at bedtime.  . traMADol (ULTRAM) 50 MG tablet Take 2 tablets by mouth every 6 (six) hours as needed. Not to exceed 6 tablets a day  . metoprolol succinate (TOPROL-XL) 50 MG 24 hr tablet Take 1 tablet (50 mg total) by mouth 2 (two) times daily. Take with or immediately following a meal.   No facility-administered encounter medications on file as of 02/25/2019.     Activities of Daily Living In your present state of health, do you have any difficulty performing the following activities: 02/25/2019  Hearing? N  Vision? N  Difficulty concentrating or making decisions? N  Walking or climbing stairs? N  Dressing or bathing? N  Doing errands,  shopping? N  Preparing Food and eating ? N  Using the Toilet? N  In the past six months, have you accidently leaked urine? N  Do you have problems with loss of bowel control? N  Managing your Medications? N  Managing your Finances? N  Housekeeping or managing your Housekeeping? N  Some recent data might be hidden    Patient Care Team: Mosie Lukes, MD as PCP - General (Family Medicine) Minus Breeding, MD as PCP - Cardiology (Cardiology)    Assessment:   This is a routine wellness examination for [place]. Physical assessment deferred to PCP.  Exercise Activities and Dietary recommendations Current Exercise Habits: The patient does not participate in regular exercise at present, Exercise limited by: None identified Diet (meal preparation, eat out, water intake, caffeinated beverages, dairy products, fruits and vegetables): well balanced, on average, 3 meals per day   Goals    . Weight (lb) < 170 lb (77.1 kg)     With diet and exercise       Fall Risk Fall Risk  02/22/2018 12/08/2017 06/07/2017 02/17/2017 10/09/2014  Falls in the past year? No No Yes No No  Comment - - Syncope - -  Number falls in past yr: - - 1 - -  Injury with Fall? - - No - -    Depression Screen PHQ 2/9 Scores 02/22/2018 02/17/2017 10/09/2014 10/07/2013  PHQ - 2 Score 0 0 0 0     Cognitive Function Ad8 score reviewed for issues:  Issues making decisions:no  Less interest in hobbies / activities:no  Repeats questions, stories (family complaining):no  Trouble using ordinary gadgets (microwave, computer, phone):no  Forgets the month  or year: no  Mismanaging finances: no  Remembering appts:no  Daily problems with thinking and/or memory:no Ad8 score is=0     MMSE - Mini Mental State Exam 02/22/2018 12/08/2017 01/31/2017  Orientation to time 5 5 5   Orientation to Place 5 5 5   Registration 3 3 3   Attention/ Calculation 5 5 5   Recall 3 3 3   Language- name 2 objects 2 2 2   Language- repeat 1 1 1    Language- follow 3 step command 3 3 3   Language- read & follow direction 1 1 1   Write a sentence 1 1 1   Copy design 1 1 1   Total score 30 30 30         Immunization History  Administered Date(s) Administered  . Influenza Split 06/30/2011, 06/11/2012  . Influenza Whole 07/18/2007, 06/18/2008  . Influenza,inj,Quad PF,6+ Mos 06/04/2015  . Influenza-Unspecified 06/25/2013, 07/05/2014, 08/05/2016, 06/21/2017, 06/02/2018  . Pneumococcal Conjugate-13 10/19/2010  . Pneumococcal Polysaccharide-23 06/25/2008, 03/20/2010, 10/07/2013  . Td 12/08/2008  . Zoster Recombinat (Shingrix) 02/17/2017, 05/19/2017      Screening Tests Health Maintenance  Topic Date Due  . TETANUS/TDAP  12/09/2018  . INFLUENZA VACCINE  04/20/2019  . MAMMOGRAM  03/01/2020  . COLONOSCOPY  04/27/2023  . DEXA SCAN  Completed  . Hepatitis C Screening  Completed  . PNA vac Low Risk Adult  Completed     Plan:    Please schedule your next medicare wellness visit with me in 1 yr.  Continue to eat heart healthy diet (full of fruits, vegetables, whole grains, lean protein, water--limit salt, fat, and sugar intake) and increase physical activity as tolerated.  Continue doing brain stimulating activities (puzzles, reading, adult coloring books, staying active) to keep memory sharp.   Bring a copy of your living will and/or healthcare power of attorney to your next office visit.  I have ordered your bone density and mammogram.  I have personally reviewed and noted the following in the patient's chart:   . Medical and social history . Use of alcohol, tobacco or illicit drugs  . Current medications and supplements . Functional ability and status . Nutritional status . Physical activity . Advanced directives . List of other physicians . Hospitalizations, surgeries, and ER visits in previous 12 months . Vitals . Screenings to include cognitive, depression, and falls . Referrals and appointments  In addition, I  have reviewed and discussed with patient certain preventive protocols, quality metrics, and best practice recommendations. A written personalized care plan for preventive services as well as general preventive health recommendations were provided to patient.     Shela Nevin, [place]  02/25/2019  Medical screening examination/treatment was performed by qualified clinical staff member and as supervising physician I was immediately available for consultation/collaboration. I have reviewed documentation and agree with assessment and plan.  Penni Homans, MD

## 2019-02-25 ENCOUNTER — Other Ambulatory Visit: Payer: Self-pay

## 2019-02-25 ENCOUNTER — Encounter: Payer: Medicare Other | Admitting: Family Medicine

## 2019-02-25 ENCOUNTER — Encounter: Payer: Self-pay | Admitting: *Deleted

## 2019-02-25 ENCOUNTER — Ambulatory Visit (INDEPENDENT_AMBULATORY_CARE_PROVIDER_SITE_OTHER): Payer: Medicare Other | Admitting: *Deleted

## 2019-02-25 DIAGNOSIS — Z0000 Encounter for general adult medical examination without abnormal findings: Secondary | ICD-10-CM | POA: Diagnosis not present

## 2019-02-25 DIAGNOSIS — Z1231 Encounter for screening mammogram for malignant neoplasm of breast: Secondary | ICD-10-CM

## 2019-02-25 DIAGNOSIS — Z780 Asymptomatic menopausal state: Secondary | ICD-10-CM

## 2019-02-25 NOTE — Patient Instructions (Signed)
Please schedule your next medicare wellness visit with me in 1 yr.  Continue to eat heart healthy diet (full of fruits, vegetables, whole grains, lean protein, water--limit salt, fat, and sugar intake) and increase physical activity as tolerated.  Continue doing brain stimulating activities (puzzles, reading, adult coloring books, staying active) to keep memory sharp.   Bring a copy of your living will and/or healthcare power of attorney to your next office visit.  I have ordered your bone density and mammogram.   Madeline [name] , Thank you for taking time to come for your Medicare Wellness Visit. I appreciate your ongoing commitment to your health goals. Please review the following plan we discussed and let me know if I can assist you in the future.   These are the goals we discussed: Goals    . Weight (lb) < 170 lb (77.1 kg)     With diet and exercise       This is a list of the screening recommended for you and due dates:  Health Maintenance  Topic Date Due  . Tetanus Vaccine  12/09/2018  . Flu Shot  04/20/2019  . Mammogram  03/01/2020  . Colon Cancer Screening  04/27/2023  . DEXA scan (bone density measurement)  Completed  .  Hepatitis C: One time screening is recommended by Center for Disease Control  (CDC) for  adults born from [birth year] through 1965.   Completed  . Pneumonia vaccines  Completed    Health Maintenance After Age 47 After age 66, you are at a higher risk for certain long-term diseases and infections as well as injuries from falls. Falls are a major cause of broken bones and head injuries in people who are older than age 63. Getting regular preventive care can help to keep you healthy and well. Preventive care includes getting regular testing and making lifestyle changes as recommended by your health care provider. Talk with your health care provider about:  Which screenings and tests you should have. A screening is a test that checks for a disease when you have no  symptoms.  A diet and exercise plan that is right for you. What should I know about screenings and tests to prevent falls? Screening and testing are the best ways to find a health problem early. Early diagnosis and treatment give you the best chance of managing medical conditions that are common after age 15. Certain conditions and lifestyle choices may make you more likely to have a fall. Your health care provider may recommend:  Regular vision checks. Poor vision and conditions such as cataracts can make you more likely to have a fall. If you wear glasses, make sure to get your prescription updated if your vision changes.  Medicine review. Work with your health care provider to regularly review all of the medicines you are taking, including over-the-counter medicines. Ask your health care provider about any side effects that may make you more likely to have a fall. Tell your health care provider if any medicines that you take make you feel dizzy or sleepy.  Osteoporosis screening. Osteoporosis is a condition that causes the bones to get weaker. This can make the bones weak and cause them to break more easily.  Blood pressure screening. Blood pressure changes and medicines to control blood pressure can make you feel dizzy.  Strength and balance checks. Your health care provider may recommend certain tests to check your strength and balance while standing, walking, or changing positions.  Foot health exam. Foot  pain and numbness, as well as not wearing proper footwear, can make you more likely to have a fall.  Depression screening. You may be more likely to have a fall if you have a fear of falling, feel emotionally low, or feel unable to do activities that you used to do.  Alcohol use screening. Using too much alcohol can affect your balance and may make you more likely to have a fall. What actions can I take to lower my risk of falls? General instructions  Talk with your health care  provider about your risks for falling. Tell your health care provider if: ? You fall. Be sure to tell your health care provider about all falls, even ones that seem minor. ? You feel dizzy, sleepy, or off-balance.  Take over-the-counter and prescription medicines only as told by your health care provider. These include any supplements.  Eat a healthy diet and maintain a healthy weight. A healthy diet includes low-fat dairy products, low-fat (lean) meats, and fiber from whole grains, beans, and lots of fruits and vegetables. Home safety  Remove any tripping hazards, such as rugs, cords, and clutter.  Install safety equipment such as grab bars in bathrooms and safety rails on stairs.  Keep rooms and walkways well-lit. Activity   Follow a regular exercise program to stay fit. This will help you maintain your balance. Ask your health care provider what types of exercise are appropriate for you.  If you need a cane or walker, use it as recommended by your health care provider.  Wear supportive shoes that have nonskid soles. Lifestyle  Do not drink alcohol if your health care provider tells you not to drink.  If you drink alcohol, limit how much you have: ? 0-1 drink a day for women. ? 0-2 drinks a day for men.  Be aware of how much alcohol is in your drink. In the [place] one drink equals one typical bottle of beer (12 oz), one-half glass of wine (5 oz), or one shot of hard liquor (1 oz).  Do not use any products that contain nicotine or tobacco, such as cigarettes and e-cigarettes. If you need help quitting, ask your health care provider. Summary  Having a healthy lifestyle and getting preventive care can help to protect your health and wellness after age 71.  Screening and testing are the best way to find a health problem early and help you avoid having a fall. Early diagnosis and treatment give you the best chance for managing medical conditions that are more common for people who  are older than age 69.  Falls are a major cause of broken bones and head injuries in people who are older than age 38. Take precautions to prevent a fall at home.  Work with your health care provider to learn what changes you can make to improve your health and wellness and to prevent falls. This information is not intended to replace advice given to you by your health care provider. Make sure you discuss any questions you have with your health care provider. Document Released: 07/19/2017 Document Revised: 07/19/2017 Document Reviewed: 07/19/2017 Elsevier Interactive Patient Education  2019 [employer].

## 2019-03-01 ENCOUNTER — Encounter (HOSPITAL_COMMUNITY): Payer: Self-pay | Admitting: Emergency Medicine

## 2019-03-01 ENCOUNTER — Emergency Department (HOSPITAL_COMMUNITY): Payer: Medicare Other

## 2019-03-01 ENCOUNTER — Other Ambulatory Visit: Payer: Self-pay

## 2019-03-01 ENCOUNTER — Observation Stay (HOSPITAL_COMMUNITY)
Admission: EM | Admit: 2019-03-01 | Discharge: 2019-03-02 | Disposition: A | Discharge status: Home / Self Care | Payer: Medicare Other | Attending: Internal Medicine | Admitting: Internal Medicine

## 2019-03-01 DIAGNOSIS — Z7982 Long term (current) use of aspirin: Secondary | ICD-10-CM | POA: Insufficient documentation

## 2019-03-01 DIAGNOSIS — I259 Chronic ischemic heart disease, unspecified: Secondary | ICD-10-CM

## 2019-03-01 DIAGNOSIS — R079 Chest pain, unspecified: Secondary | ICD-10-CM | POA: Diagnosis present

## 2019-03-01 DIAGNOSIS — I10 Essential (primary) hypertension: Secondary | ICD-10-CM | POA: Insufficient documentation

## 2019-03-01 DIAGNOSIS — I959 Hypotension, unspecified: Secondary | ICD-10-CM

## 2019-03-01 DIAGNOSIS — E782 Mixed hyperlipidemia: Secondary | ICD-10-CM

## 2019-03-01 DIAGNOSIS — I422 Other hypertrophic cardiomyopathy: Secondary | ICD-10-CM

## 2019-03-01 DIAGNOSIS — E785 Hyperlipidemia, unspecified: Secondary | ICD-10-CM | POA: Diagnosis present

## 2019-03-01 DIAGNOSIS — M797 Fibromyalgia: Secondary | ICD-10-CM | POA: Diagnosis not present

## 2019-03-01 DIAGNOSIS — Z96653 Presence of artificial knee joint, bilateral: Secondary | ICD-10-CM | POA: Diagnosis not present

## 2019-03-01 DIAGNOSIS — R0789 Other chest pain: Secondary | ICD-10-CM | POA: Diagnosis not present

## 2019-03-01 DIAGNOSIS — I2510 Atherosclerotic heart disease of native coronary artery without angina pectoris: Secondary | ICD-10-CM | POA: Diagnosis not present

## 2019-03-01 DIAGNOSIS — Z79899 Other long term (current) drug therapy: Secondary | ICD-10-CM | POA: Diagnosis not present

## 2019-03-01 DIAGNOSIS — Z20828 Contact with and (suspected) exposure to other viral communicable diseases: Secondary | ICD-10-CM | POA: Insufficient documentation

## 2019-03-01 LAB — TROPONIN I
Troponin I: <0.03 ng/mL (ref <0.03)
Troponin I: <0.03 ng/mL (ref <0.03)
Troponin I: <0.03 ng/mL (ref <0.03)

## 2019-03-01 LAB — CBC
HCT: 38.5 % (ref 36.0–46.0)
Hemoglobin: 12.7 g/dL (ref 12.0–15.0)
MCH: 29.7 pg (ref 26.0–34.0)
MCHC: 33 g/dL (ref 30.0–36.0)
MCV: 90.2 fL (ref 80.0–100.0)
Platelets: 212 10*3/uL (ref 150–400)
RBC: 4.27 MIL/uL (ref 3.87–5.11)
RDW: 13.6 % (ref 11.5–15.5)
WBC: 6.1 10*3/uL (ref 4.0–10.5)
nRBC: 0 % (ref 0.0–0.2)

## 2019-03-01 LAB — BASIC METABOLIC PANEL
Anion gap: 11 (ref 5–15)
BUN: 19 mg/dL (ref 8–23)
CO2: 24 mmol/L (ref 22–32)
Calcium: 9.7 mg/dL (ref 8.9–10.3)
Chloride: 105 mmol/L (ref 98–111)
Creatinine, Ser: 0.77 mg/dL (ref 0.44–1.00)
GFR calc Af Amer: >60 mL/min (ref >60)
GFR calc non Af Amer: >60 mL/min (ref >60)
Glucose, Bld: 109 mg/dL — ABNORMAL HIGH (ref 70–99)
Potassium: 4 mmol/L (ref 3.5–5.1)
Sodium: 140 mmol/L (ref 135–145)

## 2019-03-01 MED ORDER — METOPROLOL SUCCINATE ER 50 MG PO TB24
50.0000 mg | ORAL_TABLET | Freq: Two times a day (BID) | ORAL | Status: DC
  Administered 2019-03-01: 50 mg via ORAL
  Filled 2019-03-01: qty 1

## 2019-03-01 MED ORDER — ACETAMINOPHEN 325 MG PO TABS
650.0000 mg | ORAL_TABLET | ORAL | Status: DC | PRN
  Administered 2019-03-02: 650 mg via ORAL
  Filled 2019-03-01: qty 2

## 2019-03-01 MED ORDER — ASPIRIN 81 MG PO CHEW
324.0000 mg | CHEWABLE_TABLET | Freq: Once | ORAL | Status: DC
  Filled 2019-03-01: qty 4

## 2019-03-01 MED ORDER — PRAVASTATIN SODIUM 40 MG PO TABS
40.0000 mg | ORAL_TABLET | Freq: Every day | ORAL | Status: DC
  Administered 2019-03-01: 40 mg via ORAL
  Filled 2019-03-01: qty 1

## 2019-03-01 MED ORDER — DULOXETINE HCL 60 MG PO CPEP
60.0000 mg | ORAL_CAPSULE | Freq: Every day | ORAL | Status: DC
  Administered 2019-03-02: 60 mg via ORAL
  Filled 2019-03-01: qty 1

## 2019-03-01 MED ORDER — ALUM & MAG HYDROXIDE-SIMETH 200-200-20 MG/5ML PO SUSP
30.0000 mL | ORAL | Status: DC | PRN
  Administered 2019-03-01 (×2): 30 mL via ORAL
  Filled 2019-03-01: qty 30

## 2019-03-01 MED ORDER — SODIUM CHLORIDE 0.9 % IV BOLUS
1000.0000 mL | Freq: Once | INTRAVENOUS | Status: AC
  Administered 2019-03-01: 1000 mL via INTRAVENOUS

## 2019-03-01 MED ORDER — ASPIRIN EC 81 MG PO TBEC: 81.0000 mg | DELAYED_RELEASE_TABLET | Freq: Every day | ORAL | Status: DC

## 2019-03-01 MED ORDER — ONDANSETRON HCL 4 MG/2ML IJ SOLN: 4.0000 mg | Freq: Four times a day (QID) | INTRAMUSCULAR | Status: DC | PRN

## 2019-03-01 MED ORDER — ALUM & MAG HYDROXIDE-SIMETH 200-200-20 MG/5ML PO SUSP
30.0000 mL | Freq: Once | ORAL | Status: AC
  Administered 2019-03-01: 30 mL via ORAL
  Filled 2019-03-01: qty 30

## 2019-03-01 MED ORDER — TIZANIDINE HCL 4 MG PO TABS
4.0000 mg | ORAL_TABLET | Freq: Every day | ORAL | Status: DC
  Administered 2019-03-01: 4 mg via ORAL
  Filled 2019-03-01 (×2): qty 2
  Filled 2019-03-01: qty 1
  Filled 2019-03-01: qty 2

## 2019-03-01 MED ORDER — ASPIRIN 81 MG PO CHEW
243.0000 mg | CHEWABLE_TABLET | Freq: Once | ORAL | Status: AC
  Administered 2019-03-01: 243 mg via ORAL

## 2019-03-01 MED ORDER — TRAMADOL HCL 50 MG PO TABS: 100.0000 mg | ORAL_TABLET | Freq: Four times a day (QID) | ORAL | Status: DC | PRN

## 2019-03-01 MED ORDER — ACETAMINOPHEN 500 MG PO TABS
1000.0000 mg | ORAL_TABLET | Freq: Once | ORAL | Status: AC
  Administered 2019-03-01: 1000 mg via ORAL
  Filled 2019-03-01: qty 2

## 2019-03-01 MED ORDER — NITROGLYCERIN 0.4 MG SL SUBL: 0.4000 mg | SUBLINGUAL_TABLET | SUBLINGUAL | Status: DC | PRN

## 2019-03-01 MED ORDER — SODIUM CHLORIDE 0.9 % IV BOLUS
500.0000 mL | Freq: Once | INTRAVENOUS | Status: AC
  Administered 2019-03-01: 500 mL via INTRAVENOUS

## 2019-03-01 NOTE — H&P (Signed)
History and Physical:    Madeline [name]   ATF:[id number] DOB: 07-30-1946 DOA: 03/01/2019  Referring MD/provider: Dr Regenia Skeeter PCP: Mosie Lukes, MD   Patient coming from: Home  Chief Complaint: chest pain  History of Present Illness:   Madeline [name] is an 73 y.o. female past medical history significant for hypertension, hyperlipidemia, septal hypertrophy, fibromyalgia and GERD who was in her usual state of health until this morning when she developed substernal chest pressure in bed.  Patient states that she sometimes gets chest pain with radiation to her jaw when she has bad reflux however this felt quite different.  She states the intensity of the discomfort was much more than usual.  There was no radiation of the substernal chest pressure.  She did have some numbness and tingling in fingers 4 and 5 on the left side.  She had no change in her usual DOE.  No diaphoresis nausea vomiting or associated shortness of breath at rest.  She notes the chest discomfort lasted for approximately 3 hours and resolved spontaneously when she got to the ED without need for nitroglycerin.  ED Course:  The patient was treated with aspirin.  Chest pain had resolved by the time she was evaluated.  He was initially noted to have low blood pressures with systolics in the 27C however these responded to fluids.  Patient also complained of intermittent headache which was treated with Tylenol and aspirin with good effect.  EKG was noted to have no acute ST-T wave changes and initial troponin is negative.  Heart score is 5.  ROS:   ROS   Review of Systems: General: No fever, chills, weight changes Skin: No rashes, lesions, wounds Eyes: no discharge, redness, pain HENT: no ear pain, hearing loss, drainage, tinnitus Endocrine: no heat/cold intolerance, no polyuria Respiratory: Positive cough,, shortness of breath, hemoptysis Cardiovascular: No palpitations, chest pain GI: No nausea, vomiting, diarrhea,  constipation GU: No dysuria, increased frequency CNS: No numbness, dizziness, headache Musculoskeletal: No back pain, joint pain Blood/lymphatics: No easy bruising, bleeding Mood/affect: No anxiety/depression    Past Medical History:   Past Medical History:  Diagnosis Date  . Anxiety   . Arthritis 09-14-11   osteoarthritis, osteopenia  . Cataracts, both eyes 09-13-11   not surgical yet  . Cholelithiasis 05/17/2016  . Chronic neck pain 05/29/2016  . Cystitis 2007   sepsis post bladder biopsy, Dr Jeffie Pollock  . Fibromyalgia 09-14-11   Dr Donney Dice, [facility]  . Fractures 09/14/2011   toes-left foot  . GERD (gastroesophageal reflux disease) 09-14-11   tx. Nexium  . H/O foot surgery 12/19/2011  . Hiatal hernia 05/17/2016  . Hyperglycemia 08/23/2016  . Hyperlipidemia   . Hypertension 09-13-11   tx. meds  . 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 Left ventricular hypertrophy 05/15/2017  . Muscle cramping 10/23/2017  . TMJ click 62/37/6283   right side > left  . Varicose vein 09-14-11   bilateral , with tenderness left shin bone near foot    Past Surgical History:   Past Surgical History:  Procedure Laterality Date  . ABDOMINAL HYSTERECTOMY  1977   dysfunctional menses; age 83  . APPENDECTOMY  1963  . COLONOSCOPY  2011   negative  . CYSTOSTOMY W/ BLADDER BIOPSY  04-2006  . FOOT ARTHRODESIS  01/03/12   [facility]-- left hallux MP joint arthrodesis  . HAMMER TOE SURGERY  01/03/12   [facility], Dr Doran Durand  . NASAL  SINUS SURGERY  09-14-11   right side " sinus lift"  . SIGMOIDOSCOPY    . sling procedure  2006   a and p repair  . TONSILLECTOMY    . TOTAL KNEE ARTHROPLASTY  09/21/2011/BIL   Procedure: TOTAL KNEE ARTHROPLASTY;  Surgeon: Gearlean Alf;  Location: [facility];  Service: Orthopedics;  Laterality: Left;  . TOTAL KNEE ARTHROPLASTY  06/25/2012   Procedure: TOTAL KNEE ARTHROPLASTY;  Surgeon: Gearlean Alf, MD;  Location: [facility];  Service:  Orthopedics;  Laterality: Right;    Social History:   Social History   Socioeconomic History  . Marital status: Married    Spouse name: Not on file  . Number of children: 2  . Years of education: Not on file  . Highest education level: Not on file  Occupational History  . Not on file  Social Needs  . Financial resource strain: Not on file  . Food insecurity    Worry: Not on file    Inability: Not on file  . Transportation needs    Medical: Not on file    Non-medical: Not on file  Tobacco Use  . Smoking status: Never Smoker  . Smokeless tobacco: Never Used  Substance and Sexual Activity  . Alcohol use: Not Currently  . Drug use: No  . Sexual activity: Not Currently  Lifestyle  . Physical activity    Days per week: Not on file    Minutes per session: Not on file  . Stress: Not on file  Relationships  . Social Herbalist on phone: Not on file    Gets together: Not on file    Attends religious service: Not on file    Active member of club or organization: Not on file    Attends meetings of clubs or organizations: Not on file    Relationship status: Not on file  . Intimate partner violence    Fear of current or ex partner: Not on file    Emotionally abused: Not on file    Physically abused: Not on file    Forced sexual activity: Not on file  Other Topics Concern  . Not on file  Social History Narrative   Reg exercise 2 x weekly with trainer, no dietary restrictions. Trying to eat heart healthy.       Retired from [employer].    Allergies   Ivp dye [iodinated diagnostic agents] and Codeine  Family history:   Family History  Problem Relation Age of Onset  . Aortic aneurysm Father        femoral aneurysm  . Aortic dissection Father   . Mental illness Brother        paranoid schizophrenia  . Pneumonia Brother        pneumothorax in [place]  . Heart attack Other        both GF;PGM; M uncle  . Stroke Paternal Aunt        aneurysm  . Colon cancer  Paternal Aunt        X 2  . Other Mother        lung tumor which resolved w/o treatment  . Hypertension Mother   . Other Maternal Grandmother        enalraged heart  . Hypertension Maternal Grandmother   . Heart attack Maternal Grandfather        In fifties, died.  . Hypertension Maternal Grandfather   . Heart attack Paternal Grandmother  In forties.  . Hypertension Paternal Grandmother   . Heart attack Paternal Grandfather   . Heart disease Paternal Grandfather   . Hypertension Paternal Grandfather   . Hypertension Daughter   . Supraventricular tachycardia Daughter   . Hypertension Son   . AAA (abdominal aortic aneurysm) Son   . Diabetes Neg Hx     Current Medications:   Prior to Admission medications   Medication Sig Start Date End Date Taking? Authorizing Provider  aspirin EC 81 MG tablet Take 81 mg by mouth at bedtime.    Yes [provider]  Cholecalciferol (VITAMIN D3) 2000 UNITS TABS Take 2,000 Units by mouth 2 (two) times a day.    Yes [provider]  cyclobenzaprine (FLEXERIL) 10 MG tablet Take 20 mg by mouth at bedtime.   Yes [provider]  DULoxetine (CYMBALTA) 60 MG capsule Take 60 mg by mouth daily.   Yes [provider]  fosinopril (MONOPRIL) 10 MG tablet Take 0.5 tablets (5 mg total) by mouth daily. Patient taking differently: Take 5 mg by mouth at bedtime.  03/08/18  Yes Minus Breeding, MD  furosemide (LASIX) 20 MG tablet Take 20 mg by mouth as needed.   Yes [provider]  metoprolol succinate (TOPROL-XL) 50 MG 24 hr tablet Take 1 tablet (50 mg total) by mouth 2 (two) times daily. Take with or immediately following a meal. 03/08/18 03/01/19 Yes Hochrein, Jeneen Rinks, MD  Multiple Vitamin (MULTI-DAY PO) Take 1 tablet by mouth daily.    Yes [provider]  pravastatin (PRAVACHOL) 40 MG tablet Take 1 tablet (40 mg total) by mouth at bedtime. 08/28/18  Yes Mosie Lukes, MD  pyridOXINE (VITAMIN B-6) 100 MG  tablet Take 100 mg by mouth daily.   Yes [provider]  tizanidine (ZANAFLEX) 2 MG capsule Take 4 mg by mouth at bedtime.   Yes [provider]  traMADol (ULTRAM) 50 MG tablet Take 2 tablets by mouth every 6 (six) hours as needed. Not to exceed 6 tablets a day 06/04/15  Yes [provider]    Physical Exam:   Vitals:   03/01/19 1300 03/01/19 1315 03/01/19 1320 03/01/19 1330  BP: (!) 78/50 92/61 92/61  101/61  Pulse: (!) 56 (!) 58 60 (!) 57  Resp: 12 13 16 10   Temp:      TempSrc:      SpO2: 98% 99% 99% 99%     Physical Exam: Blood pressure 101/61, pulse (!) 57, temperature 98.7 F (37.1 C), temperature source Oral, resp. rate 10, SpO2 99 %. Gen: Tired appearing female lying flat in bed in no acute distress appearing somewhat older than stated age. Eyes: Sclerae anicteric. Conjunctiva mildly injected. Chest: Moderately good air entry bilaterally with no adventitious sounds.  CV: Distant, regular, no audible murmurs. Abdomen: NABS, soft, nondistended, nontender. No tenderness to light or deep palpation. No rebound, no guarding. Extremities: No edema.  Skin: Warm and dry. No rashes, lesions or wounds. Neuro: Alert and oriented times 3; grossly nonfocal. Psych: Patient is cooperative, logical and coherent with appropriate mood and affect.  Data Review:    Labs: Basic Metabolic Panel: Recent Labs  Lab 03/01/19 1012  NA 140  K 4.0  CL 105  CO2 24  GLUCOSE 109*  BUN 19  CREATININE 0.77  CALCIUM 9.7   Liver Function Tests: No results for input(s): AST, ALT, ALKPHOS, BILITOT, PROT, ALBUMIN in the last 168 hours. No results for input(s): LIPASE, AMYLASE in the last 168  hours. No results for input(s): AMMONIA in the last 168 hours. CBC: Recent Labs  Lab 03/01/19 1012  WBC 6.1  HGB 12.7  HCT 38.5  MCV 90.2  PLT 212   Cardiac Enzymes: Recent Labs  Lab 03/01/19 1012  TROPONINI <0.03    BNP (last 3 results) No results for input(s):  PROBNP in the last 8760 hours. CBG: No results for input(s): GLUCAP in the last 168 hours.  Urinalysis    Component Value Date/Time   COLORURINE AMBER (A) 07/18/2016 2125   APPEARANCEUR CLOUDY (A) 07/18/2016 2125   LABSPEC 1.025 07/18/2016 2125   PHURINE 5.5 07/18/2016 2125   GLUCOSEU NEGATIVE 07/18/2016 2125   GLUCOSEU NEGATIVE 06/30/2015 0921   HGBUR MODERATE (A) 07/18/2016 2125   HGBUR large 09/10/2009 1611   BILIRUBINUR SMALL (A) 07/18/2016 2125   BILIRUBINUR negative 06/09/2015 1412   KETONESUR >80 (A) 07/18/2016 2125   PROTEINUR 30 (A) 07/18/2016 2125   UROBILINOGEN 0.2 06/30/2015 0921   NITRITE NEGATIVE 07/18/2016 2125   LEUKOCYTESUR SMALL (A) 07/18/2016 2125      Radiographic Studies: Dg Chest 2 View  Result Date: 03/01/2019 CLINICAL DATA:  Onset central chest pain this morning. EXAM: CHEST - 2 VIEW COMPARISON:  CT chest 01/17/2017.  PA and lateral chest 01/16/2017. FINDINGS: The lungs are clear. Heart size is normal. No pneumothorax or pleural effusion. No acute or focal bony abnormality. IMPRESSION: No acute disease. Electronically Signed   By: Inge Rise M.D.   On: 03/01/2019 10:38    EKG: Independently reviewed.  Sinus rhythm at 70.  Normal intervals.  Normal axis.  T wave inversions V1 V2.    Assessment/Plan:   Principal Problem:   Chest pain Active Problems:   Hyperlipidemia   Essential hypertension   LOW BLOOD PRESSURE   Fibromyalgia   Asymmetric septal hypertrophy (HCC)   CHEST PAIN 73 year old female with hypertension, hyperlipidemia presents with chest pain and heart score of 5.  Patient's last stress test was 2 years ago and it was negative. EKGs without any specific changes and initial troponin is negative. Patient is chest discomfort free after treatment with aspirin. Will admit patient to telemetry overnight, continue beta-blocker PRN nitroglycerin and aspirin. Rest echocardiogram with dobutamine as requested for the morning.   FIBROMYALGIA Continue duloxetine Patient is apparently on both cyclobenzaprine as well as tizanidine at home We will continue the tizanidine but will hold the cyclobenzaprine for now Continue as needed tramadol  BLOOD PRESSURE Patient with relatively lower blood pressures on admission now low normal. We will continue patient's metoprolol Hold fosinopril overnight, no evidence for decompensated heart failure Hold as needed Lasix, patient has not taken for a while. These can be restarted upon discharge as warranted.    Other information:   DVT prophylaxis: Lovenox ordered. Code Status: Full code. Family Communication: Patient's family is aware that she is here Disposition Plan: Home Consults called: None Admission status: Observation  The medical decision making is of moderate complexity, therefore this is a level 2 visit.  Dewaine Oats Tublu Angelyn Osterberg [facility]  If 7PM-7AM, please contact night-coverage [email or web address] Password [facility] 03/01/2019, 1:56 PM

## 2019-03-01 NOTE — ED Notes (Signed)
ED TO INPATIENT HANDOFF REPORT  ED Nurse Name and Phone #: Kathlee Nations [phone]  S Name/Age/Gender Madeline [name] 73 y.o. female Room/Bed: 017C/017C  Code Status   Code Status: Prior  Home/SNF/Other Home Patient oriented to: self, place, time and situation Is this baseline? Yes   Triage Complete: Triage complete  Chief Complaint chest & jaw pain  Triage Note [facility] since waking this am. States she is also having bilateral jaw pain. Also endorses HA, denies n/v or sob. States L fingers are tingling   Allergies Allergies  Allergen Reactions  . Ivp Dye [Iodinated Diagnostic Agents]     Rash , hives  . Codeine Itching    Unless something given to counteract itching    Level of Care/Admitting Diagnosis ED Disposition    ED Disposition Condition [facility] Area: [place] [[id number]]  Level of Care: Telemetry Cardiac [103]  I expect the patient will be discharged within 24 hours: Yes  LOW acuity---Tx typically complete <24 hrs---ACUTE conditions typically can be evaluated <24 hours---LABS likely to return to acceptable levels <24 hours---IS near functional baseline---EXPECTED to return to current living arrangement---NOT newly hypoxic: Meets criteria for 5C-Observation unit  Covid Evaluation: Screening Protocol (No Symptoms)  Diagnosis: Chest pain [784696]  Admitting Physician: Vashti Hey [[id number]]  Attending Physician: Vashti Hey [[id number]]  PT Class (Do Not Modify): Observation [104]  PT Acc Code (Do Not Modify): Observation [10022]       B Medical/Surgery History Past Medical History:  Diagnosis Date  . Anxiety   . Arthritis 09-14-11   osteoarthritis, osteopenia  . Cataracts, both eyes 09-13-11   not surgical yet  . Cholelithiasis 05/17/2016  . Chronic neck pain 05/29/2016  . Cystitis 2007   sepsis post bladder biopsy, Dr Jeffie Pollock  . Fibromyalgia 09-14-11   Dr Donney Dice,  [facility]  . Fractures 09/14/2011   toes-left foot  . GERD (gastroesophageal reflux disease) 09-14-11   tx. Nexium  . H/O foot surgery 12/19/2011  . Hiatal hernia 05/17/2016  . Hyperglycemia 08/23/2016  . Hyperlipidemia   . Hypertension 09-13-11   tx. meds  . 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 Left ventricular hypertrophy 05/15/2017  . Muscle cramping 10/23/2017  . TMJ click 27/25/3664   right side > left  . 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 ABDOMINAL HYSTERECTOMY  1977   dysfunctional menses; age 28  . APPENDECTOMY  1963  . COLONOSCOPY  2011   negative  . CYSTOSTOMY W/ BLADDER BIOPSY  04-2006  . FOOT ARTHRODESIS  01/03/12   [facility]-- left hallux MP joint arthrodesis  . HAMMER TOE SURGERY  01/03/12   [facility], Dr Doran Durand  . NASAL SINUS SURGERY  09-14-11   right side " sinus lift"  . SIGMOIDOSCOPY    . sling procedure  2006   a and p repair  . TONSILLECTOMY    . TOTAL KNEE ARTHROPLASTY  09/21/2011/BIL   Procedure: TOTAL KNEE ARTHROPLASTY;  Surgeon: Gearlean Alf;  Location: [facility];  Service: Orthopedics;  Laterality: Left;  . TOTAL KNEE ARTHROPLASTY  06/25/2012   Procedure: TOTAL KNEE ARTHROPLASTY;  Surgeon: Gearlean Alf, MD;  Location: [facility];  Service: Orthopedics;  Laterality: Right;     A IV Location/Drains/Wounds Patient Lines/Drains/Airways Status   Active Line/Drains/Airways  Name:   Placement date:   Placement time:   Site:   Days:   Peripheral IV 03/01/19 Right Wrist   03/01/19    1141    Wrist   less than 1          Intake/Output Last 24 hours  Intake/Output Summary (Last 24 hours) at 03/01/2019 1349 Last data filed at 03/01/2019 1318 Gross per 24 hour  Intake 1000 ml  Output -  Net 1000 ml    Labs/Imaging Results for orders placed or performed during the hospital encounter of 03/01/19 (from the past 48 hour(s))  Basic  metabolic panel     Status: Abnormal   Collection Time: 03/01/19 10:12 AM  Result Value Ref Range   Sodium 140 135 - 145 mmol/L   Potassium 4.0 3.5 - 5.1 mmol/L   Chloride 105 98 - 111 mmol/L   CO2 24 22 - 32 mmol/L   Glucose, Bld 109 (H) 70 - 99 mg/dL   BUN 19 8 - 23 mg/dL   Creatinine, Ser 0.77 0.44 - 1.00 mg/dL   Calcium 9.7 8.9 - 10.3 mg/dL   GFR calc non Af Amer >60 >60 mL/min   GFR calc Af Amer >60 >60 mL/min   Anion gap 11 5 - 15    Comment: Performed at [facility], [place] [address] [place], [place] [address]  CBC     Status: None   Collection Time: 03/01/19 10:12 AM  Result Value Ref Range   WBC 6.1 4.0 - 10.5 K/uL   RBC 4.27 3.87 - 5.11 MIL/uL   Hemoglobin 12.7 12.0 - 15.0 g/dL   HCT 38.5 36.0 - 46.0 %   MCV 90.2 80.0 - 100.0 fL   MCH 29.7 26.0 - 34.0 pg   MCHC 33.0 30.0 - 36.0 g/dL   RDW 13.6 11.5 - 15.5 %   Platelets 212 150 - 400 K/uL   nRBC 0.0 0.0 - 0.2 %    Comment: Performed at [facility], [place] [address] [place], [place] [address]  Troponin I - ONCE - STAT     Status: None   Collection Time: 03/01/19 10:12 AM  Result Value Ref Range   Troponin I <0.03 <0.03 ng/mL    Comment: Performed at [facility] [address] [place], [place] [address]   Dg Chest 2 View  Result Date: 03/01/2019 CLINICAL DATA:  Onset central chest pain this morning. EXAM: CHEST - 2 VIEW COMPARISON:  CT chest 01/17/2017.  PA and lateral chest 01/16/2017. FINDINGS: The lungs are clear. Heart size is normal. No pneumothorax or pleural effusion. No acute or focal bony abnormality. IMPRESSION: No acute disease. Electronically Signed   By: Inge Rise M.D.   On: 03/01/2019 10:38    Pending Labs Unresulted Labs (From admission, onward)    Start     Ordered   03/01/19 1148  Novel Coronavirus,NAA,(SEND-OUT TO REF LAB - TAT 24-48 hrs); Hosp Order  (Asymptomatic Patients Labs)  Once,   STAT    Question:  Rule Out  Answer:  Yes   03/01/19 1147           Vitals/Pain Today's Vitals   03/01/19 1308 03/01/19 1315 03/01/19 1320 03/01/19 1330  BP:  92/61 92/61 101/61  Pulse:  (!) 58 60 (!) 57  Resp:  13 16 10   Temp:      TempSrc:      SpO2:  99% 99% 99%  PainSc: 3  Isolation Precautions No active isolations  Medications Medications  nitroGLYCERIN (NITROSTAT) SL tablet 0.4 mg (has no administration in time range)  sodium chloride 0.9 % bolus 1,000 mL (0 mLs Intravenous Stopped 03/01/19 1318)  acetaminophen (TYLENOL) tablet 1,000 mg (1,000 mg Oral Given 03/01/19 1154)  aspirin chewable tablet 243 mg (243 mg Oral Given 03/01/19 1211)  sodium chloride 0.9 % bolus 500 mL (500 mLs Intravenous New Bag/Given 03/01/19 1318)    Mobility walks Low fall risk   Focused Assessments Cardiac Assessment Handoff:    Lab Results  Component Value Date   TROPONINI <0.03 03/01/2019   Lab Results  Component Value Date   DDIMER 0.57 (H) 01/16/2017   Does the Patient currently have chest pain? Yes     R Recommendations: See Admitting Provider Note  Report given to:   Additional Notes: Pt describes her chest as "discomfort"

## 2019-03-01 NOTE — ED Notes (Signed)
Pt states headache is coming back. Pain 5/10   Chest pain 2/10

## 2019-03-01 NOTE — ED Triage Notes (Signed)
[facility] since waking this am. States she is also having bilateral jaw pain. Also endorses HA, denies n/v or sob. States L fingers are tingling

## 2019-03-01 NOTE — ED Provider Notes (Signed)
[facility] EMERGENCY DEPARTMENT Provider Note   CSN: [id number] Arrival date & time: 03/01/19  0957    History   Chief Complaint Chief Complaint  Patient presents with  . Jaw Pain  . Chest Pain    HPI Madeline [name] is a 73 y.o. female.     HPI  73 year old female presents with chest pain.  Started around 9:30 AM when she awoke.  Has a history of reflux but this does not quite feel like reflux and is worse.  Does not radiate.  However she is also been feeling tingling in her left ring and fifth digit.  She has not had any weakness anywhere.  She is slowly starting to develop worsening bitemporal headache over the past hour or so.  No vomiting.  No sweating but she is breathing heavier.  Has also had some bilateral jaw pain.  Past Medical History:  Diagnosis Date  . Anxiety   . Arthritis 09-14-11   osteoarthritis, osteopenia  . Cataracts, both eyes 09-13-11   not surgical yet  . Cholelithiasis 05/17/2016  . Chronic neck pain 05/29/2016  . Cystitis 2007   sepsis post bladder biopsy, Dr Jeffie Pollock  . Fibromyalgia 09-14-11   Dr Donney Dice, [facility]  . Fractures 09/14/2011   toes-left foot  . GERD (gastroesophageal reflux disease) 09-14-11   tx. Nexium  . H/O foot surgery 12/19/2011  . Hiatal hernia 05/17/2016  . Hyperglycemia 08/23/2016  . Hyperlipidemia   . Hypertension 09-13-11   tx. meds  . 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 Left ventricular hypertrophy 05/15/2017  . Muscle cramping 10/23/2017  . TMJ click 85/46/2703   right side > left  . 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 Chest pain 03/01/2019  . Shoulder pain, bilateral 08/28/2018  . Muscle cramping 10/23/2017  . Asymmetric septal hypertrophy ([place]) 06/09/2017  . Left ventricular hypertrophy 05/15/2017  . Abnormal ECG 02/18/2017  . Pulmonary nodule 02/17/2017  .  SOB (shortness of breath) 02/17/2017  . Syncope 01/31/2017  . Preventative health care 08/23/2016  . Hyperglycemia 08/23/2016  . Aortic atherosclerosis ([place]) 05/29/2016  . Chronic neck pain 05/29/2016  . Gallstone 05/17/2016  . Microscopic hematuria 01/08/2016  . OA (osteoarthritis) of knee 06/25/2012  . 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 Osteopenia 03/05/2008  . Hyperlipidemia 12/21/2007  . Essential hypertension 12/21/2007  . LOW BLOOD PRESSURE 12/21/2007  . Hiatal hernia with gastroesophageal reflux 12/21/2007  . CONSTIPATION, CHRONIC 12/21/2007  . Fibromyalgia 12/21/2007  . Memory loss 12/21/2007    Past Surgical History:  Procedure Laterality Date  . ABDOMINAL HYSTERECTOMY  1977   dysfunctional menses; age 49  . APPENDECTOMY  1963  . COLONOSCOPY  2011   negative  . CYSTOSTOMY W/ BLADDER BIOPSY  04-2006  . FOOT ARTHRODESIS  01/03/12   [facility]-- left hallux MP joint arthrodesis  . HAMMER TOE SURGERY  01/03/12   [facility], Dr Doran Durand  . NASAL SINUS SURGERY  09-14-11   right side " sinus lift"  . SIGMOIDOSCOPY    . sling procedure  2006   a and p repair  . TONSILLECTOMY    . TOTAL KNEE ARTHROPLASTY  09/21/2011/BIL   Procedure: TOTAL KNEE ARTHROPLASTY;  Surgeon: Gearlean Alf;  Location: [facility];  Service: Orthopedics;  Laterality: Left;  . TOTAL KNEE  ARTHROPLASTY  06/25/2012   Procedure: TOTAL KNEE ARTHROPLASTY;  Surgeon: Gearlean Alf, MD;  Location: [facility];  Service: Orthopedics;  Laterality: Right;     OB History   No obstetric history on file.      Home Medications    Prior to Admission medications   Medication Sig Start Date End Date Taking? Authorizing Provider  aspirin EC 81 MG tablet Take 81 mg by mouth at bedtime.    Yes [provider]  Cholecalciferol (VITAMIN D3) 2000 UNITS TABS Take 2,000 Units by mouth 2 (two) times a day.    Yes [provider]  cyclobenzaprine (FLEXERIL) 10 MG tablet Take 20 mg by  mouth at bedtime.   Yes [provider]  DULoxetine (CYMBALTA) 60 MG capsule Take 60 mg by mouth daily.   Yes [provider]  fosinopril (MONOPRIL) 10 MG tablet Take 0.5 tablets (5 mg total) by mouth daily. Patient taking differently: Take 5 mg by mouth at bedtime.  03/08/18  Yes Minus Breeding, MD  furosemide (LASIX) 20 MG tablet Take 20 mg by mouth as needed.   Yes [provider]  metoprolol succinate (TOPROL-XL) 50 MG 24 hr tablet Take 1 tablet (50 mg total) by mouth 2 (two) times daily. Take with or immediately following a meal. 03/08/18 03/01/19 Yes Hochrein, Jeneen Rinks, MD  Multiple Vitamin (MULTI-DAY PO) Take 1 tablet by mouth daily.    Yes [provider]  pravastatin (PRAVACHOL) 40 MG tablet Take 1 tablet (40 mg total) by mouth at bedtime. 08/28/18  Yes Mosie Lukes, MD  pyridOXINE (VITAMIN B-6) 100 MG tablet Take 100 mg by mouth daily.   Yes [provider]  tizanidine (ZANAFLEX) 2 MG capsule Take 4 mg by mouth at bedtime.   Yes [provider]  traMADol (ULTRAM) 50 MG tablet Take 2 tablets by mouth every 6 (six) hours as needed. Not to exceed 6 tablets a day 06/04/15  Yes [provider]    Family History Family History  Problem Relation Age of Onset  . Aortic aneurysm Father        femoral aneurysm  . Aortic dissection Father   . Mental illness Brother        paranoid schizophrenia  . Pneumonia Brother        pneumothorax in [place]  . Heart attack Other        both GF;PGM; M uncle  . Stroke Paternal Aunt        aneurysm  . Colon cancer Paternal Aunt        X 2  . Other Mother        lung tumor which resolved w/o treatment  . Hypertension Mother   . Other Maternal Grandmother        enalraged heart  . Hypertension Maternal Grandmother   . Heart attack Maternal Grandfather        In fifties, died.  . Hypertension Maternal Grandfather   . Heart attack Paternal Grandmother        In forties.  . Hypertension  Paternal Grandmother   . Heart attack Paternal Grandfather   . Heart disease Paternal Grandfather   . Hypertension Paternal Grandfather   . Hypertension Daughter   . Supraventricular tachycardia Daughter   . Hypertension Son   . AAA (abdominal aortic aneurysm) Son   . Diabetes Neg Hx     Social History Social History   Tobacco Use  . Smoking status: Never Smoker  . Smokeless  tobacco: Never Used  Substance Use Topics  . Alcohol use: Not Currently  . Drug use: No     Allergies   Ivp dye [iodinated diagnostic agents] and Codeine   Review of Systems Review of Systems  Constitutional: Negative for diaphoresis.  Respiratory: Positive for shortness of breath.   Cardiovascular: Positive for chest pain.  Gastrointestinal: Negative for abdominal pain, nausea and vomiting.  Musculoskeletal: Negative for back pain.  Neurological: Positive for numbness and headaches. Negative for weakness.  All other systems reviewed and are negative.    Physical Exam Updated Vital Signs BP 129/77 (BP Location: Right Arm)   Pulse 72   Temp 98.2 F (36.8 C) (Oral)   Resp 18   Ht 5\' 8"  (1.727 m)   Wt 85.7 kg   SpO2 100%   BMI 28.74 kg/m   Physical Exam Vitals signs and nursing note reviewed.  Constitutional:      General: She is not in acute distress.    Appearance: She is well-developed. She is not ill-appearing or diaphoretic.  HENT:     Head: Normocephalic and atraumatic.     Right Ear: External ear normal.     Left Ear: External ear normal.     Nose: Nose normal.  Eyes:     General:        Right eye: No discharge.        Left eye: No discharge.  Cardiovascular:     Rate and Rhythm: Normal rate and regular rhythm.     Pulses:          Radial pulses are 1+ on the right side and 1+ on the left side.       Dorsalis pedis pulses are 1+ on the right side and 1+ on the left side.     Heart sounds: Normal heart sounds.  Pulmonary:     Effort: Pulmonary effort is normal.      Breath sounds: Normal breath sounds.  Chest:     Chest wall: No tenderness.  Abdominal:     Palpations: Abdomen is soft.     Tenderness: There is no abdominal tenderness.  Skin:    General: Skin is warm and dry.  Neurological:     Mental Status: She is alert.     Comments: CN 3-12 grossly intact. 5/5 strength in all 4 extremities. Grossly normal sensation.  Psychiatric:        Mood and Affect: Mood is not anxious.      ED Treatments / Results  Labs (all labs ordered are listed, but only abnormal results are displayed) Labs Reviewed  BASIC METABOLIC PANEL - Abnormal; Notable for the following components:      Result Value   Glucose, Bld 109 (*)    All other components within normal limits  NOVEL CORONAVIRUS, NAA (HOSPITAL ORDER, SEND-OUT TO REF LAB)  CBC  TROPONIN I  TROPONIN I  TROPONIN I  TROPONIN I    EKG EKG Interpretation  Date/Time:  Friday March 01 2019 10:04:20 EDT Ventricular Rate:  73 PR Interval:  184 QRS Duration: 90 QT Interval:  426 QTC Calculation: 469 R Axis:   21 Text Interpretation:  Normal sinus rhythm Possible Left atrial enlargement Low voltage QRS Borderline ECG no signifcant change since may 2018 Confirmed by Sherwood Gambler [phone]) on 03/01/2019 11:08:37 AM   Radiology Dg Chest 2 View  Result Date: 03/01/2019 CLINICAL DATA:  Onset central chest pain this morning. EXAM: CHEST - 2 VIEW COMPARISON:  CT  chest 01/17/2017.  PA and lateral chest 01/16/2017. FINDINGS: The lungs are clear. Heart size is normal. No pneumothorax or pleural effusion. No acute or focal bony abnormality. IMPRESSION: No acute disease. Electronically Signed   By: Inge Rise M.D.   On: 03/01/2019 10:38    Procedures Procedures (including critical care time)  Medications Ordered in ED Medications  nitroGLYCERIN (NITROSTAT) SL tablet 0.4 mg (has no administration in time range)  aspirin EC tablet 81 mg (has no administration in time range)  traMADol (ULTRAM) tablet  100 mg (has no administration in time range)  metoprolol succinate (TOPROL-XL) 24 hr tablet 50 mg (50 mg Oral Not Given 03/01/19 1443)  pravastatin (PRAVACHOL) tablet 40 mg (has no administration in time range)  DULoxetine (CYMBALTA) DR capsule 60 mg (60 mg Oral Not Given 03/01/19 1443)  tiZANidine (ZANAFLEX) tablet 4 mg (has no administration in time range)  acetaminophen (TYLENOL) tablet 650 mg (has no administration in time range)  ondansetron (ZOFRAN) injection 4 mg (has no administration in time range)  sodium chloride 0.9 % bolus 1,000 mL (0 mLs Intravenous Stopped 03/01/19 1318)  acetaminophen (TYLENOL) tablet 1,000 mg (1,000 mg Oral Given 03/01/19 1154)  aspirin chewable tablet 243 mg (243 mg Oral Given 03/01/19 1211)  sodium chloride 0.9 % bolus 500 mL (500 mLs Intravenous New Bag/Given 03/01/19 1318)  alum & mag hydroxide-simeth (MAALOX/MYLANTA) 200-200-20 MG/5ML suspension 30 mL (30 mLs Oral Given 03/01/19 1450)     Initial Impression / Assessment and Plan / ED Course  I have reviewed the triage vital signs and the nursing notes.  Pertinent labs & imaging results that were available during my care of the patient were reviewed by me and considered in my medical decision making (see chart for details).        Initial troponin is negative.  My suspicion for PE or dissection is pretty low.  Given her history of hypertension, hyperlipidemia and age, I think she will need admission for ACS rule out.  Transiently had some low blood pressure though came up with fluid.  Admit to hospitalist.  Final Clinical Impressions(s) / ED Diagnoses   Final diagnoses:  Atypical chest pain    ED Discharge Orders    None       Sherwood Gambler, MD 03/01/19 1501

## 2019-03-02 ENCOUNTER — Observation Stay (HOSPITAL_BASED_OUTPATIENT_CLINIC_OR_DEPARTMENT_OTHER): Payer: Medicare Other

## 2019-03-02 DIAGNOSIS — I259 Chronic ischemic heart disease, unspecified: Secondary | ICD-10-CM | POA: Diagnosis not present

## 2019-03-02 DIAGNOSIS — E782 Mixed hyperlipidemia: Secondary | ICD-10-CM | POA: Diagnosis not present

## 2019-03-02 DIAGNOSIS — R079 Chest pain, unspecified: Secondary | ICD-10-CM

## 2019-03-02 DIAGNOSIS — I952 Hypotension due to drugs: Secondary | ICD-10-CM | POA: Diagnosis not present

## 2019-03-02 DIAGNOSIS — K219 Gastro-esophageal reflux disease without esophagitis: Secondary | ICD-10-CM

## 2019-03-02 DIAGNOSIS — I422 Other hypertrophic cardiomyopathy: Secondary | ICD-10-CM | POA: Diagnosis not present

## 2019-03-02 LAB — NM MYOCAR MULTI W/SPECT W/WALL MOTION / EF
Estimated workload: 1 METS
Exercise duration (min): 0 min
Exercise duration (sec): 0 s
MPHR: 147 {beats}/min
Peak HR: 93 {beats}/min
Percent HR: 63 %
Rest HR: 69 {beats}/min

## 2019-03-02 LAB — NOVEL CORONAVIRUS, NAA (HOSP ORDER, SEND-OUT TO REF LAB; TAT 18-24 HRS): SARS-CoV-2, NAA: NOT DETECTED

## 2019-03-02 LAB — ECHOCARDIOGRAM COMPLETE
Height: 68 in
Weight: 3024 oz

## 2019-03-02 MED ORDER — REGADENOSON 0.4 MG/5ML IV SOLN
0.4000 mg | Freq: Once | INTRAVENOUS | Status: DC
  Filled 2019-03-02: qty 5

## 2019-03-02 MED ORDER — METOPROLOL TARTRATE 25 MG PO TABS: 12.5000 mg | ORAL_TABLET | Freq: Two times a day (BID) | ORAL | 1 refills | Status: DC

## 2019-03-02 MED ORDER — TECHNETIUM TC 99M TETROFOSMIN IV KIT
10.0000 | PACK | Freq: Once | INTRAVENOUS | Status: AC | PRN
  Administered 2019-03-02: 10 via INTRAVENOUS

## 2019-03-02 MED ORDER — TECHNETIUM TC 99M TETROFOSMIN IV KIT
30.0000 | PACK | Freq: Once | INTRAVENOUS | Status: AC | PRN
  Administered 2019-03-02: 30 via INTRAVENOUS

## 2019-03-02 MED ORDER — REGADENOSON 0.4 MG/5ML IV SOLN
INTRAVENOUS | Status: AC
  Administered 2019-03-02: 0.4 mg
  Filled 2019-03-02: qty 5

## 2019-03-02 MED ORDER — PANTOPRAZOLE SODIUM 40 MG PO TBEC: 40.0000 mg | DELAYED_RELEASE_TABLET | Freq: Every day | ORAL | 1 refills | Status: AC

## 2019-03-02 NOTE — Progress Notes (Signed)
Pt states she "didn't feel quite right" in her chest-pointing to epigastric area and stated tips of fingers were tingling. BP 150/80's, EKG obtained with no acute changes noted. O2 applied at 2L for comfort and pt given Mylanta per request and PRN order. Pt voiced relief!`. Jessie Foot, RN

## 2019-03-02 NOTE — Discharge Summary (Signed)
Physician Discharge Summary  Madeline [name] JXB:[id number] DOB: [birth year] DOA: 03/01/2019  PCP: Mosie Lukes, MD  Admit date: 03/01/2019 Discharge date: 03/02/2019 Consultations: None Admitted From: home Disposition: home  Discharge Diagnoses:  Principal Problem:   Chest pain Active Problems:   Hyperlipidemia   Essential hypertension   LOW BLOOD PRESSURE   Fibromyalgia   Asymmetric septal hypertrophy ([place])   Brief/Interim Summary: 73 y.o. female past medical history significant for hypertension, hyperlipidemia, septal hypertrophy, fibromyalgia and GERD presented to the ED on June 08/2019 with complaints of substernal chest pressure with radiation to jaw.  Patient states this is similar to her reflux pain but more intense this time and also reported some tingling and numbness to fourth and fifth fingers on the left side.  No associated vomiting or palpitations or syncope reported.  Patient however was noted to be hypotensive on presentation with systolic blood pressure in the 70s and responded to IV fluid bolus.  EKG was unremarkable and troponins were negative.  She was felt to have a heart score of 5 and she was admitted for observation with plan for echo and stress test.  Chest pain: This resolved by the time she was seen by hospitalist team in the ED with no recurrence after admission.  Echocardiogram showed findings of LVH and EF 65% similar to prior echo in 2018.  She underwent Lexiscan-stress test this morning with findings as below:  There was no ST segment deviation noted during stress.  No T wave inversion was noted during stress.  The study is normal. There are no perfusion defects consistent with prior infarct or current ischemia  This is a low risk study.  The left ventricular ejection fraction is normal (55-65%).  Hypotension: This is likely secondary to antihypertensives in the setting of recent intentional weight loss and auto correction of blood pressures.   Patient on fosinopril 10 mg and Toprol-XL 50 mg twice daily at home.  Her systolic blood pressure was in the 80s in the ED.  Blood pressures improved to low 100s with IV fluids.  Her systolic blood pressure appears to be 1 30-1 40 in supine position and 110 on standing.  She likely has some orthostasis.  Patient advised to hold off on fosinopril for now and reduced metoprolol dosage to 12.5 twice daily (to avoid reflex tachycardia) with holding parameters explained.  Patient has a blood pressure monitor at home and she verbalized understanding.  She also uses multiple muscle relaxants including tizanidine and Flexeril which could be contributing as well.  GERD: Could have contributed to chest pain.  Will add PPI to medication regimen   Patient feels good to go home and is medically cleared for discharge.  She will follow-up with her PCP in 1 week and primary cardiologist Dr. Percival Spanish with [facility] in 2 weeks for further recommendations.  Discharge Exam: Vitals:   03/02/19 1542 03/02/19 1545  BP: 116/85 112/82  Pulse: 86 90  Resp:    Temp:    SpO2:     Vitals:   03/02/19 1217 03/02/19 1541 03/02/19 1542 03/02/19 1545  BP: (!) 148/94 135/85 116/85 112/82  Pulse: 84 80 86 90  Resp:      Temp: 98.2 F (36.8 C)     TempSrc: Oral     SpO2: 98%     Weight:      Height:        General: Pt is alert, awake, not in acute distress Cardiovascular: RRR, S1/S2 +, no  rubs, no gallops Respiratory: CTA bilaterally, no wheezing, no rhonchi Abdominal: Soft, NT, ND, bowel sounds + Extremities: no edema, no cyanosis  Discharge Instructions  Discharge Instructions    Call MD for:  difficulty breathing, headache or visual disturbances   Complete by: As directed    Call MD for:  persistant dizziness or light-headedness   Complete by: As directed    Call MD for:  severe uncontrolled pain   Complete by: As directed    Diet - low sodium heart healthy   Complete by: As directed    Increase  activity slowly   Complete by: As directed      Allergies as of 03/02/2019      Reactions   Ivp Dye [iodinated Diagnostic Agents]    Rash , hives   Codeine Itching   Unless something given to counteract itching      Medication List    STOP taking these medications   Flexeril 10 MG tablet Generic drug: cyclobenzaprine   fosinopril 10 MG tablet Commonly known as: MONOPRIL   metoprolol succinate 50 MG 24 hr tablet Commonly known as: TOPROL-XL     TAKE these medications   aspirin EC 81 MG tablet Take 81 mg by mouth at bedtime.   DULoxetine 60 MG capsule Commonly known as: CYMBALTA Take 60 mg by mouth daily.   furosemide 20 MG tablet Commonly known as: LASIX Take 20 mg by mouth as needed.   metoprolol tartrate 25 MG tablet Commonly known as: LOPRESSOR Take 0.5 tablets (12.5 mg total) by mouth 2 (two) times daily. Hold SBP<100, HR <60   MULTI-DAY PO Take 1 tablet by mouth daily.   pantoprazole 40 MG tablet Commonly known as: Protonix Take 1 tablet (40 mg total) by mouth daily.   pravastatin 40 MG tablet Commonly known as: PRAVACHOL Take 1 tablet (40 mg total) by mouth at bedtime.   pyridOXINE 100 MG tablet Commonly known as: VITAMIN B-6 Take 100 mg by mouth daily.   tizanidine 2 MG capsule Commonly known as: ZANAFLEX Take 4 mg by mouth at bedtime.   Ultram 50 MG tablet Generic drug: traMADol Take 2 tablets by mouth every 6 (six) hours as needed. Not to exceed 6 tablets a day   Vitamin D3 50 MCG (2000 UT) Tabs Take 2,000 Units by mouth 2 (two) times a day.       Allergies  Allergen Reactions  . Ivp Dye [Iodinated Diagnostic Agents]     Rash , hives  . Codeine Itching    Unless something given to counteract itching       Discharge Condition: Stable CODE STATUS: Full code Diet recommendation: 2 g sodium diet Recommendations for Outpatient Follow-up:  1. Follow up with PCP in 1week for blood pressure check and further adjustments 2. Please  follow up primary cardiologist Dr. Percival Spanish in 2 weeks     The results of significant diagnostics from this hospitalization (including imaging, microbiology, ancillary and laboratory) are listed below for reference.     Microbiology: Recent Results (from the past 240 hour(s))  Novel Coronavirus,NAA,(SEND-OUT TO REF LAB - TAT 24-48 hrs); Hosp Order     Status: None   Collection Time: 03/01/19 11:48 AM   Specimen: Nasopharyngeal Swab; Respiratory  Result Value Ref Range Status   SARS-CoV-2, NAA NOT DETECTED NOT DETECTED Final    Comment: (NOTE) This test was developed and its performance characteristics determined by [employer]. This test has not been FDA cleared or approved. This test  has been authorized by FDA under an Emergency Use Authorization (EUA). This test is only authorized for the duration of time the declaration that circumstances exist justifying the authorization of the emergency use of in vitro diagnostic tests for detection of SARS-CoV-2 virus and/or diagnosis of COVID-19 infection under section 564(b)(1) of the Act, 21 U.S.C. 789FYB-0(F)(7), unless the authorization is terminated or revoked sooner. When diagnostic testing is negative, the possibility of a false negative result should be considered in the context of a patient's recent exposures and the presence of clinical signs and symptoms consistent with COVID-19. An individual without symptoms of COVID-19 and who is not shedding SARS-CoV-2 virus would expect to have a negative (not detected) result in this assay. Performed  At: [facility] [address] [place], [place] [id number] Rush Farmer MD PO:[id number]    [place]  Final    Comment: Performed at [facility], [place] [address], [place], [place] [address]     Labs: BNP (last 3 results) No results for input(s): BNP in the last 8760 hours. Basic Metabolic Panel: Recent Labs  Lab 03/01/19 1012  NA  140  K 4.0  CL 105  CO2 24  GLUCOSE 109*  BUN 19  CREATININE 0.77  CALCIUM 9.7   Liver Function Tests: No results for input(s): AST, ALT, ALKPHOS, BILITOT, PROT, ALBUMIN in the last 168 hours. No results for input(s): LIPASE, AMYLASE in the last 168 hours. No results for input(s): AMMONIA in the last 168 hours. CBC: Recent Labs  Lab 03/01/19 1012  WBC 6.1  HGB 12.7  HCT 38.5  MCV 90.2  PLT 212   Cardiac Enzymes: Recent Labs  Lab 03/01/19 1012 03/01/19 1930 03/01/19 2029  TROPONINI <0.03 <0.03 <0.03   BNP: Invalid input(s): POCBNP CBG: No results for input(s): GLUCAP in the last 168 hours. D-Dimer No results for input(s): DDIMER in the last 72 hours. Hgb A1c No results for input(s): HGBA1C in the last 72 hours. Lipid Profile No results for input(s): CHOL, HDL, LDLCALC, TRIG, CHOLHDL, LDLDIRECT in the last 72 hours. Thyroid function studies No results for input(s): TSH, T4TOTAL, T3FREE, THYROIDAB in the last 72 hours.  Invalid input(s): FREET3 Anemia work up No results for input(s): VITAMINB12, FOLATE, FERRITIN, TIBC, IRON, RETICCTPCT in the last 72 hours. Urinalysis    Component Value Date/Time   COLORURINE AMBER (A) 07/18/2016 2125   APPEARANCEUR CLOUDY (A) 07/18/2016 2125   LABSPEC 1.025 07/18/2016 2125   PHURINE 5.5 07/18/2016 2125   GLUCOSEU NEGATIVE 07/18/2016 2125   GLUCOSEU NEGATIVE 06/30/2015 0921   HGBUR MODERATE (A) 07/18/2016 2125   HGBUR large 09/10/2009 1611   BILIRUBINUR SMALL (A) 07/18/2016 2125   BILIRUBINUR negative 06/09/2015 1412   KETONESUR >80 (A) 07/18/2016 2125   PROTEINUR 30 (A) 07/18/2016 2125   UROBILINOGEN 0.2 06/30/2015 0921   NITRITE NEGATIVE 07/18/2016 2125   LEUKOCYTESUR SMALL (A) 07/18/2016 2125   Sepsis Labs Invalid input(s): PROCALCITONIN,  WBC,  LACTICIDVEN Microbiology Recent Results (from the past 240 hour(s))  Novel Coronavirus,NAA,(SEND-OUT TO REF LAB - TAT 24-48 hrs); Hosp Order     Status: None   Collection  Time: 03/01/19 11:48 AM   Specimen: Nasopharyngeal Swab; Respiratory  Result Value Ref Range Status   SARS-CoV-2, NAA NOT DETECTED NOT DETECTED Final    Comment: (NOTE) This test was developed and its performance characteristics determined by [employer]. This test has not been FDA cleared or approved. This test has been authorized by FDA under an  Emergency Use Authorization (EUA). This test is only authorized for the duration of time the declaration that circumstances exist justifying the authorization of the emergency use of in vitro diagnostic tests for detection of SARS-CoV-2 virus and/or diagnosis of COVID-19 infection under section 564(b)(1) of the Act, 21 U.S.C. 594VOP-9(Y)(9), unless the authorization is terminated or revoked sooner. When diagnostic testing is negative, the possibility of a false negative result should be considered in the context of a patient's recent exposures and the presence of clinical signs and symptoms consistent with COVID-19. An individual without symptoms of COVID-19 and who is not shedding SARS-CoV-2 virus would expect to have a negative (not detected) result in this assay. Performed  At: [facility] [address] [place], [place] [id number] Rush Farmer MD TR:[id number]    [place]  Final    Comment: Performed at [facility], [place] [address], [place], [place] [address]    Procedures/Studies: Dg Chest 2 View  Result Date: 03/01/2019 CLINICAL DATA:  Onset central chest pain this morning. EXAM: CHEST - 2 VIEW COMPARISON:  CT chest 01/17/2017.  PA and lateral chest 01/16/2017. FINDINGS: The lungs are clear. Heart size is normal. No pneumothorax or pleural effusion. No acute or focal bony abnormality. IMPRESSION: No acute disease. Electronically Signed   By: Inge Rise M.D.   On: 03/01/2019 10:38   Nm Myocar Multi W/spect W/wall Motion / Ef  Result Date: 03/02/2019  There was no ST segment  deviation noted during stress.  No T wave inversion was noted during stress.  The study is normal. There are no perfusion defects consistent with prior infarct or current ischemia  This is a low risk study.  The left ventricular ejection fraction is normal (55-65%).    (Echo, Carotid, EGD, Colonoscopy, ERCP)  Time coordinating discharge: Over 30 minutes  SIGNED:   Guilford Shi, MD  [facility] 03/02/2019, 4:18 PM Pager   If 7PM-7AM, please contact night-coverage [email or web address] Password [email or web address]

## 2019-03-02 NOTE — Progress Notes (Addendum)
   Madeline [name] presented for a 1-day Lexiscan cardiolite today.  No immediate complications.  Stress imaging is pending at this time.  Erma Heritage, PA-C 03/02/2019, 11:21 AM    Stress Test Results showed:    There was no ST segment deviation noted during stress.  No T wave inversion was noted during stress.  The study is normal. There are no perfusion defects consistent with prior infarct or current ischemia  This is a low risk study.  The left ventricular ejection fraction is normal (55-65%).   Called the patient to review results and paged the admitting team to make them aware.   Signed, Erma Heritage, PA-C 03/02/2019, 2:28 PM Pager: [phone]

## 2019-03-02 NOTE — Progress Notes (Signed)
  Echocardiogram 2D Echocardiogram has been performed.  Jennette Dubin 03/02/2019, 3:28 PM

## 2019-03-04 ENCOUNTER — Telehealth: Payer: Self-pay | Admitting: Cardiology

## 2019-03-04 NOTE — Telephone Encounter (Signed)
Pt called to report that she was on the ED over the weekend with chest pain but it was determined not cardiac... she has been having a lot more reflux.. her BP and HR were "low" so she was advised to decrease her Metoprolol to 12.5mg  bid and stopped her fosinopril.Marland Kitchen  She wanted to be sure Dr. Percival Spanish is okay with the changes.    I advised her make the recommended changes but to monitor her symptoms to let us know if she develops any dizziness, sob.... to monitor and record her BP and HR a couple of times and let us know of she has any problems. Pt agreed.

## 2019-03-04 NOTE — Telephone Encounter (Signed)
New Message     Pt c/o medication issue:  1. Name of Medication: Metoprolol and Lisinopril   2. How are you currently taking this medication (dosage and times per day)? 25mg  once a day   3. Are you having a reaction (difficulty breathing--STAT)? No  4. What is your medication issue? Patient was admitted into the hospital and her medication was changed she would like to discuss it with a nurse to make sure the changes were ok.

## 2019-03-07 MED ORDER — METOPROLOL TARTRATE 25 MG PO TABS: 12.5000 mg | ORAL_TABLET | Freq: Two times a day (BID) | ORAL | 1 refills | Status: AC

## 2019-03-07 NOTE — Telephone Encounter (Addendum)
Advised patient, verbalized understanding. Rx sent to [facility] as requested

## 2019-03-07 NOTE — Telephone Encounter (Signed)
OK with the changes.

## 2019-03-12 ENCOUNTER — Other Ambulatory Visit: Payer: Self-pay

## 2019-03-12 ENCOUNTER — Emergency Department (HOSPITAL_COMMUNITY): Payer: Medicare Other

## 2019-03-12 ENCOUNTER — Emergency Department (HOSPITAL_COMMUNITY)
Admission: EM | Admit: 2019-03-12 | Discharge: 2019-03-12 | Disposition: A | Discharge status: Home / Self Care | Payer: Medicare Other | Source: Home / Self Care | Attending: Emergency Medicine | Admitting: Emergency Medicine

## 2019-03-12 DIAGNOSIS — R0789 Other chest pain: Secondary | ICD-10-CM | POA: Insufficient documentation

## 2019-03-12 DIAGNOSIS — I10 Essential (primary) hypertension: Secondary | ICD-10-CM | POA: Insufficient documentation

## 2019-03-12 DIAGNOSIS — Z79899 Other long term (current) drug therapy: Secondary | ICD-10-CM | POA: Insufficient documentation

## 2019-03-12 DIAGNOSIS — I7101 Dissection of thoracic aorta: Secondary | ICD-10-CM | POA: Diagnosis not present

## 2019-03-12 DIAGNOSIS — R079 Chest pain, unspecified: Secondary | ICD-10-CM | POA: Diagnosis not present

## 2019-03-12 DIAGNOSIS — Z7982 Long term (current) use of aspirin: Secondary | ICD-10-CM | POA: Insufficient documentation

## 2019-03-12 LAB — TROPONIN I (HIGH SENSITIVITY)
Troponin I (High Sensitivity): 9 ng/L (ref <18)
Troponin I (High Sensitivity): 9 ng/L (ref <18)

## 2019-03-12 LAB — BASIC METABOLIC PANEL
Anion gap: 11 (ref 5–15)
BUN: 10 mg/dL (ref 8–23)
CO2: 25 mmol/L (ref 22–32)
Calcium: 9.2 mg/dL (ref 8.9–10.3)
Chloride: 98 mmol/L (ref 98–111)
Creatinine, Ser: 1.12 mg/dL — ABNORMAL HIGH (ref 0.44–1.00)
GFR calc Af Amer: 56 mL/min — ABNORMAL LOW (ref >60)
GFR calc non Af Amer: 49 mL/min — ABNORMAL LOW (ref >60)
Glucose, Bld: 138 mg/dL — ABNORMAL HIGH (ref 70–99)
Potassium: 3.9 mmol/L (ref 3.5–5.1)
Sodium: 134 mmol/L — ABNORMAL LOW (ref 135–145)

## 2019-03-12 LAB — CBC
HCT: 36.2 % (ref 36.0–46.0)
Hemoglobin: 11.7 g/dL — ABNORMAL LOW (ref 12.0–15.0)
MCH: 29.3 pg (ref 26.0–34.0)
MCHC: 32.3 g/dL (ref 30.0–36.0)
MCV: 90.5 fL (ref 80.0–100.0)
Platelets: 329 10*3/uL (ref 150–400)
RBC: 4 MIL/uL (ref 3.87–5.11)
RDW: 13.2 % (ref 11.5–15.5)
WBC: 13.3 10*3/uL — ABNORMAL HIGH (ref 4.0–10.5)
nRBC: 0 % (ref 0.0–0.2)

## 2019-03-12 MED ORDER — SODIUM CHLORIDE 0.9% FLUSH
3.0000 mL | Freq: Once | INTRAVENOUS | Status: AC
  Administered 2019-03-12: 3 mL via INTRAVENOUS

## 2019-03-12 NOTE — ED Notes (Signed)
Pt to be discharged.  HR has been consistent in 120's.  Per Dr. Eulis Foster he is ok with discharge

## 2019-03-12 NOTE — ED Triage Notes (Signed)
Pt reports waking up with sharp substernal chest pain-  Pt states the pain has been coming and going but gets to a 10 at times.

## 2019-03-12 NOTE — ED Notes (Signed)
Patient verbalizes understanding of discharge instructions. Opportunity for questioning and answers were provided. Armband removed by staff, pt discharged from ED.  

## 2019-03-12 NOTE — ED Provider Notes (Signed)
[facility] EMERGENCY DEPARTMENT Provider Note   CSN: [id number] Arrival date & time: 03/12/19  1634    History   Chief Complaint Chief Complaint  Patient presents with  . Chest Pain    HPI Madeline [name] is a 73 y.o. female.     HPI   She presents for evaluation of chest pain.  She is very vague about it.  Initially she told nursing that her pain started today however the pain has been present at least several days, and she was previously evaluated for the same pain on 03/01/2019.  It is difficult to find out if she has associated symptoms, but generally she complains only of chest pain.  It is unclear how she got here.  Apparently she came by private vehicle.  She denies fever, chills, cough, shortness of breath, weakness or dizziness.  There are no other known modifying factors.  Past Medical History:  Diagnosis Date  . Anxiety   . Arthritis 09-14-11   osteoarthritis, osteopenia  . Cataracts, both eyes 09-13-11   not surgical yet  . Cholelithiasis 05/17/2016  . Chronic neck pain 05/29/2016  . Cystitis 2007   sepsis post bladder biopsy, Dr Jeffie Pollock  . Fibromyalgia 09-14-11   Dr Donney Dice, [facility]  . Fractures 09/14/2011   toes-left foot  . GERD (gastroesophageal reflux disease) 09-14-11   tx. Nexium  . H/O foot surgery 12/19/2011  . Hiatal hernia 05/17/2016  . Hyperglycemia 08/23/2016  . Hyperlipidemia   . Hypertension 09-13-11   tx. meds  . 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 Left ventricular hypertrophy 05/15/2017  . Muscle cramping 10/23/2017  . TMJ click 25/95/6387   right side > left  . 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 Chest pain 03/01/2019  . Shoulder pain, bilateral 08/28/2018  . Muscle cramping 10/23/2017  . Asymmetric septal hypertrophy ([place]) 06/09/2017  . Left ventricular hypertrophy 05/15/2017  .  Abnormal ECG 02/18/2017  . Pulmonary nodule 02/17/2017  . SOB (shortness of breath) 02/17/2017  . Syncope 01/31/2017  . Preventative health care 08/23/2016  . Hyperglycemia 08/23/2016  . Aortic atherosclerosis ([place]) 05/29/2016  . Chronic neck pain 05/29/2016  . Gallstone 05/17/2016  . Microscopic hematuria 01/08/2016  . OA (osteoarthritis) of knee 06/25/2012  . 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 Osteopenia 03/05/2008  . Hyperlipidemia 12/21/2007  . Essential hypertension 12/21/2007  . LOW BLOOD PRESSURE 12/21/2007  . Hiatal hernia with gastroesophageal reflux 12/21/2007  . CONSTIPATION, CHRONIC 12/21/2007  . Fibromyalgia 12/21/2007  . Memory loss 12/21/2007    Past Surgical History:  Procedure Laterality Date  . ABDOMINAL HYSTERECTOMY  1977   dysfunctional menses; age 62  . APPENDECTOMY  1963  . COLONOSCOPY  2011   negative  . CYSTOSTOMY W/ BLADDER BIOPSY  04-2006  . FOOT ARTHRODESIS  01/03/12   [facility]-- left hallux MP joint arthrodesis  . HAMMER TOE SURGERY  01/03/12   [facility], Dr Doran Durand  . NASAL SINUS SURGERY  09-14-11   right side " sinus lift"  . SIGMOIDOSCOPY    . sling procedure  2006   a and p repair  . TONSILLECTOMY    . TOTAL KNEE ARTHROPLASTY  09/21/2011/BIL   Procedure: TOTAL KNEE ARTHROPLASTY;  Surgeon: Gearlean Alf;  Location: [facility];  Service: Orthopedics;  Laterality: Left;  .  TOTAL KNEE ARTHROPLASTY  06/25/2012   Procedure: TOTAL KNEE ARTHROPLASTY;  Surgeon: Gearlean Alf, MD;  Location: [facility];  Service: Orthopedics;  Laterality: Right;     OB History   No obstetric history on file.      Home Medications    Prior to Admission medications   Medication Sig Start Date End Date Taking? Authorizing Provider  aspirin EC 81 MG tablet Take 81 mg by mouth at bedtime.     [provider]  Cholecalciferol (VITAMIN D3) 2000 UNITS TABS Take 2,000 Units by mouth 2 (two) times a day.     [provider]   DULoxetine (CYMBALTA) 60 MG capsule Take 60 mg by mouth daily.    [provider]  furosemide (LASIX) 20 MG tablet Take 20 mg by mouth as needed.    [provider]  metoprolol tartrate (LOPRESSOR) 25 MG tablet Take 0.5 tablets (12.5 mg total) by mouth 2 (two) times daily. Hold SBP<100, HR <60 03/07/19 03/06/20  Minus Breeding, MD  Multiple Vitamin (MULTI-DAY PO) Take 1 tablet by mouth daily.     [provider]  pantoprazole (PROTONIX) 40 MG tablet Take 1 tablet (40 mg total) by mouth daily. 03/02/19 03/01/20  Guilford Shi, MD  pravastatin (PRAVACHOL) 40 MG tablet Take 1 tablet (40 mg total) by mouth at bedtime. 08/28/18   Mosie Lukes, MD  pyridOXINE (VITAMIN B-6) 100 MG tablet Take 100 mg by mouth daily.    [provider]  tizanidine (ZANAFLEX) 2 MG capsule Take 4 mg by mouth at bedtime.    [provider]  traMADol (ULTRAM) 50 MG tablet Take 2 tablets by mouth every 6 (six) hours as needed. Not to exceed 6 tablets a day 06/04/15   [provider]    Family History Family History  Problem Relation Age of Onset  . Aortic aneurysm Father        femoral aneurysm  . Aortic dissection Father   . Mental illness Brother        paranoid schizophrenia  . Pneumonia Brother        pneumothorax in [place]  . Heart attack Other        both GF;PGM; M uncle  . Stroke Paternal Aunt        aneurysm  . Colon cancer Paternal Aunt        X 2  . Other Mother        lung tumor which resolved w/o treatment  . Hypertension Mother   . Other Maternal Grandmother        enalraged heart  . Hypertension Maternal Grandmother   . Heart attack Maternal Grandfather        In fifties, died.  . Hypertension Maternal Grandfather   . Heart attack Paternal Grandmother        In forties.  . Hypertension Paternal Grandmother   . Heart attack Paternal Grandfather   . Heart disease Paternal Grandfather   . Hypertension Paternal Grandfather   .  Hypertension Daughter   . Supraventricular tachycardia Daughter   . Hypertension Son   . AAA (abdominal aortic aneurysm) Son   . Diabetes Neg Hx     Social History Social History   Tobacco Use  . Smoking status: Never Smoker  . Smokeless tobacco: Never Used  Substance Use Topics  . Alcohol use: Not Currently  . Drug use: No     Allergies   Ivp dye [iodinated diagnostic agents] and Codeine  Review of Systems Review of Systems  All other systems reviewed and are negative.    Physical Exam Updated Vital Signs BP 121/89 (BP Location: Right Arm)   Pulse (!) 123   Temp 98.1 F (36.7 C) (Oral)   SpO2 99%   Physical Exam Vitals signs and nursing note reviewed.  Constitutional:      General: She is not in acute distress.    Appearance: She is well-developed and normal weight. She is not ill-appearing, toxic-appearing or diaphoretic.  HENT:     Head: Normocephalic and atraumatic.  Eyes:     Conjunctiva/sclera: Conjunctivae normal.     Pupils: Pupils are equal, round, and reactive to light.  Neck:     Musculoskeletal: Normal range of motion and neck supple.     Trachea: Phonation normal.  Cardiovascular:     Rate and Rhythm: Normal rate and regular rhythm.  Pulmonary:     Effort: Pulmonary effort is normal.     Breath sounds: Normal breath sounds.  Chest:     Chest wall: No tenderness.  Abdominal:     General: There is no distension.     Palpations: Abdomen is soft.     Tenderness: There is no abdominal tenderness. There is no guarding.  Musculoskeletal: Normal range of motion.  Skin:    General: Skin is warm and dry.  Neurological:     Mental Status: She is alert and oriented to person, place, and time.     Motor: No abnormal muscle tone.     Comments: No dysarthria or aphasia.  Psychiatric:        Behavior: Behavior normal.        Thought Content: Thought content normal.        Judgment: Judgment normal.     Comments: Appears depressed.  She shows poor  eye contact.      ED Treatments / Results  Labs (all labs ordered are listed, but only abnormal results are displayed) Labs Reviewed  BASIC METABOLIC PANEL - Abnormal; Notable for the following components:      Result Value   Sodium 134 (*)    Glucose, Bld 138 (*)    Creatinine, Ser 1.12 (*)    GFR calc non Af Amer 49 (*)    GFR calc Af Amer 56 (*)    All other components within normal limits  CBC - Abnormal; Notable for the following components:   WBC 13.3 (*)    Hemoglobin 11.7 (*)    All other components within normal limits  TROPONIN I (HIGH SENSITIVITY)  TROPONIN I (HIGH SENSITIVITY)    EKG EKG Interpretation  Date/Time:  Tuesday March 12 2019 16:41:00 EDT Ventricular Rate:  122 PR Interval:  166 QRS Duration: 82 QT Interval:  322 QTC Calculation: 458 R Axis:   40 Text Interpretation:  Sinus tachycardia Possible Left atrial enlargement Low voltage QRS Borderline ECG since last tracing no significant change Confirmed by Daleen Bo [id number]) on 03/12/2019 6:03:20 PM   Radiology Dg Chest 2 View  Result Date: 03/12/2019 CLINICAL DATA:  73 year old female with history of central chest pain and shortness of breath for the past 3 weeks. EXAM: CHEST - 2 VIEW COMPARISON:  Chest x-ray 03/01/2019. FINDINGS: Lung volumes are normal. No consolidative airspace disease. No pleural effusions. No pneumothorax. No pulmonary nodule or mass noted. Pulmonary vasculature and the cardiomediastinal silhouette are within normal limits. Aortic atherosclerosis. IMPRESSION: 1.  No radiographic evidence of acute cardiopulmonary disease. 2. Aortic atherosclerosis. Electronically Signed  By: Vinnie Langton M.D.   On: 03/12/2019 18:03    Procedures Procedures (including critical care time)  Medications Ordered in ED Medications  sodium chloride flush (NS) 0.9 % injection 3 mL (has no administration in time range)     Initial Impression / Assessment and Plan / ED Course  I have reviewed  the triage vital signs and the nursing notes.  Pertinent labs & imaging results that were available during my care of the patient were reviewed by me and considered in my medical decision making (see chart for details).  Clinical Course as of Mar 12 1927  Tue Mar 12, 2019  1919 Normal  Troponin I (High Sensitivity) [EW]  1919 Normal except white count high, hemoglobin low  CBC(!) [EW]  1919 Normal except sodium low, glucose high, creatinine high, GFR low  Basic metabolic panel(!) [EW]  2841 No infiltrate or CHF, images reviewed by me  DG Chest 2 View [EW]    Clinical Course User Index [EW] Daleen Bo, MD        Patient Vitals for the past 24 hrs:  BP Temp Temp src Pulse SpO2  03/12/19 1641 121/89 98.1 F (36.7 C) Oral (!) 123 99 %    7:28 PM Reevaluation with update and discussion. After initial assessment and treatment, an updated evaluation reveals no change in clinical status.  Findings discussed with the patient and all questions were answered. Daleen Bo   Medical Decision Making: Nonspecific chest pain presently ongoing and relatively constant.  Today screening with high-sensitivity troponin is negative x2.  It is very unlikely that she is having acute coronary syndrome.  She appears relatively low risk for cardiac problems at this time.  She has had recent evaluation for same 2 weeks ago, with hospitalization and stress testing performed.  There is no indication for further evaluation at this time.  At discharge from recent hospitalization she was advised to use a PPI, and follow-up with cardiology as an outpatient.  CRITICAL CARE-no Performed by: Daleen Bo  Nursing Notes Reviewed/ Care Coordinated Applicable Imaging Reviewed Interpretation of Laboratory Data incorporated into ED treatment  The patient appears reasonably screened and/or stabilized for discharge and I doubt any other medical condition or other [facility] requiring further screening, evaluation, or  treatment in the ED at this time prior to discharge.  Plan: Home Medications-continue usual; Home Treatments-rest, fluids; return here if the recommended treatment, does not improve the symptoms; Recommended follow up-cardiology for a follow-up appointment in 1 week.    Final Clinical Impressions(s) / ED Diagnoses   Final diagnoses:  Nonspecific chest pain    ED Discharge Orders    None       Daleen Bo, MD 03/12/19 1929

## 2019-03-13 ENCOUNTER — Encounter: Payer: Self-pay | Admitting: Internal Medicine

## 2019-03-13 ENCOUNTER — Ambulatory Visit (INDEPENDENT_AMBULATORY_CARE_PROVIDER_SITE_OTHER): Payer: Medicare Other | Admitting: Internal Medicine

## 2019-03-13 VITALS — HR 88 | Wt 189.0 lb

## 2019-03-13 DIAGNOSIS — R0789 Other chest pain: Secondary | ICD-10-CM | POA: Diagnosis not present

## 2019-03-13 DIAGNOSIS — I700 Atherosclerosis of aorta: Secondary | ICD-10-CM | POA: Diagnosis not present

## 2019-03-13 DIAGNOSIS — Z01812 Encounter for preprocedural laboratory examination: Secondary | ICD-10-CM

## 2019-03-13 DIAGNOSIS — I422 Other hypertrophic cardiomyopathy: Secondary | ICD-10-CM

## 2019-03-13 MED ORDER — PREDNISONE 50 MG PO TABS: ORAL_TABLET | ORAL | 0 refills | Status: AC

## 2019-03-13 NOTE — Patient Instructions (Signed)
Medication Instructions:  Continue current medications Use Ultram for pain  If you need a refill on your cardiac medications before your next appointment, please call your pharmacy.   Lab work: BMET - due 1 week prior to CT test If you have labs (blood work) drawn today and your tests are completely normal, you will receive your results only by: Marland Kitchen MyChart Message (if you have MyChart) OR . A paper copy in the mail If you have any lab test that is abnormal or we need to change your treatment, we will call you to review the results.  Testing/Procedures: Dr. Hilty/Dr. Percival Spanish suggest that you have a coronary CT test. This is done at [place] will be called to schedule this. Instructions noted below  Follow-Up with Dr. Percival Spanish after your coronary CT test.   Any Other Special Instructions Will Be Listed Below (If Applicable).  Coronary CT  Please arrive at the [facility] main entrance of [facility] at ____ AM/PM (30-45 minutes prior to test start time)  [facility] [place], [place] [address] [phone]  Proceed to the [facility] Radiology Department (First Floor).  Please follow these instructions carefully (unless otherwise directed):   On the Night Before the Test: . Be sure to Drink plenty of water. . Do not consume any caffeinated/decaffeinated beverages or chocolate 12 hours prior to your test. . Do not take any antihistamines 12 hours prior to your test. . If you take Metformin do not take 24 hours prior to test. . If the patient has contrast allergy: ? Patient will need a prescription for Prednisone and very clear instructions (as follows): 1. Prednisone 50 mg - take 13 hours prior to test 2. Take another Prednisone 50 mg 7 hours prior to test 3. Take another Prednisone 50 mg 1 hour prior to test 4. Take Benadryl 50 mg 1 hour prior to test . Patient must complete all four doses of above prophylactic  medications. . Patient will need a ride after test due to Benadryl.  On the Day of the Test: . Drink plenty of water. Do not drink any water within one hour of the test. . Do not eat any food 4 hours prior to the test. . You may take your regular medications prior to the test.  . Take metoprolol (Lopressor) two hours prior to test. o HOLD Furosemide/Hydrochlorothiazide morning of the test.  After the Test: . Drink plenty of water. . After receiving IV contrast, you may experience a mild flushed feeling. This is normal. . On occasion, you may experience a mild rash up to 24 hours after the test. This is not dangerous. If this occurs, you can take Benadryl 25 mg and increase your fluid intake. . If you experience trouble breathing, this can be serious. If it is severe call 911 IMMEDIATELY. If it is mild, please call our office. . If you take any of these medications: Glipizide/Metformin, Avandament, Glucavance, please do not take 48 hours after completing test.

## 2019-03-13 NOTE — Progress Notes (Signed)
OFFICE NOTE  Chief Complaint:  Chest pain  Primary Care Physician: Mosie Lukes, MD  HPI:  Madeline [name] is a 73 y.o. female with a past medial history significant for recurrent chest pain.  He followed by Dr. Percival Spanish last saw her in the office in February 2020.  She has a past medical history significant for LVH with septal hypertrophy and an MRI of her heart indicated severe basal septal hypertrophy findings.  She also has a history of syncope in the past, hypertension, and stable left common iliac artery aneurysm.  She has had recurrent chest pain symptoms and does have a history of chronic neck pain and was diagnosed with fibromyalgia and anxiety in the past.  Emergency department in June 2020 for what was thought to be atypical chest pain and then discharged.  Cardiac work-up was negative.  Was again seen in the ER for nonspecific chest pain yesterday and was discharged with a negative troponin.  In the interim nuclear stress testing on March 02, 2019 which was a low risk Myoview with normal LVEF and no ischemia.  She describes her chest pain is sharp, located in the area around the xiphoid process which is fairly consistent.  Although the symptoms come and go they tend to be always in the same spot and sometimes are worse with exertion and relieved by rest but other times, at rest.  They do not seem to be worse with taking a deep breath, movement or other activities.  She felt like this might be reflux although to me it seems atypical for that.  Reflux medications have not helped.  I am more concerned this may be a chest wall or neuropathic pain.  Given her history of fibromyalgia.  She is on Cymbalta however and does not seem to have relief from the pain.  She also has a prescription for tramadol but she says she is not taking it because that is "a different type of pain.  In the office today she actually looked quite and comfortable.  She was clutching her chest and particularly the area  over her xiphoid and did seem short of breath although was wearing a mask for COVID-19 restrictions.  PMHx:  Past Medical History:  Diagnosis Date   Anxiety    Arthritis 09-14-11   osteoarthritis, osteopenia   Cataracts, both eyes 09-13-11   not surgical yet   Cholelithiasis 05/17/2016   Chronic neck pain 05/29/2016   Cystitis 2007   sepsis post bladder biopsy, Dr Jeffie Pollock   Fibromyalgia 09-14-11   Dr Donney Dice, [facility]   Fractures 09/14/2011   toes-left foot   GERD (gastroesophageal reflux disease) 09-14-11   tx. Nexium   H/O foot surgery 12/19/2011   Hiatal hernia 05/17/2016   Hyperglycemia 08/23/2016   Hyperlipidemia    Hypertension 09-13-11   tx. 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   bilateral , with tenderness left shin bone near foot    Past Surgical History:  Procedure Laterality Date   ABDOMINAL HYSTERECTOMY  1977   dysfunctional menses; age 68   [facility]   COLONOSCOPY  2011   negative   CYSTOSTOMY W/ BLADDER BIOPSY  04-2006   FOOT ARTHRODESIS  01/03/12   [facility]-- left hallux MP joint arthrodesis   HAMMER TOE  SURGERY  01/03/12   [facility], Dr Doran Durand   NASAL SINUS SURGERY  09-14-11   right side " sinus lift"   SIGMOIDOSCOPY     sling procedure  2006   a and p repair   TONSILLECTOMY     TOTAL KNEE ARTHROPLASTY  09/21/2011/[facility]   Procedure: TOTAL KNEE ARTHROPLASTY;  Surgeon: Gearlean Alf;  Location: [facility];  Service: Orthopedics;  Laterality: Left;   TOTAL KNEE ARTHROPLASTY  06/25/2012   Procedure: TOTAL KNEE ARTHROPLASTY;  Surgeon: Gearlean Alf, MD;  Location: [facility];  Service: Orthopedics;  Laterality: Right;    FAMHx:  Family History  Problem Relation Age of Onset   Aortic aneurysm Father        femoral aneurysm   Aortic  dissection Father    Mental illness Brother        paranoid schizophrenia   Pneumonia Brother        pneumothorax in [facility]   Heart attack Other        both GF;PGM; M uncle   Stroke Paternal Aunt        aneurysm   Colon cancer Paternal Aunt        X 2   Other Mother        lung tumor which resolved w/o treatment   Hypertension Mother    Other Maternal Grandmother        enalraged heart   Hypertension Maternal Grandmother    Heart attack Maternal Grandfather        In fifties, died.   Hypertension Maternal Grandfather    Heart attack Paternal Grandmother        In forties.   Hypertension Paternal Grandmother    Heart attack Paternal Grandfather    Heart disease Paternal Grandfather    Hypertension Paternal Grandfather    Hypertension Daughter    Supraventricular tachycardia Daughter    Hypertension Son    AAA (abdominal aortic aneurysm) Son    Diabetes Neg Hx     SOCHx:   reports that she has never smoked. She has never used smokeless tobacco. She reports previous alcohol use. She reports that she does not use drugs.  ALLERGIES:  Allergies  Allergen Reactions   Ivp Dye [Iodinated Diagnostic Agents]     Rash , hives   Codeine Itching    Unless something given to counteract itching    ROS: Pertinent items noted in HPI and remainder of comprehensive ROS otherwise negative.  HOME MEDS: Current Outpatient Medications on File Prior to Visit  Medication Sig Dispense Refill   aspirin EC 81 MG tablet Take 81 mg by mouth at bedtime.      Cholecalciferol (VITAMIN D3) 2000 UNITS TABS Take 2,000 Units by mouth 2 (two) times a day.      DULoxetine (CYMBALTA) 60 MG capsule Take 60 mg by mouth daily.     furosemide (LASIX) 20 MG tablet Take 20 mg by mouth as needed.     metoprolol tartrate (LOPRESSOR) 25 MG tablet Take 0.5 tablets (12.5 mg total) by mouth 2 (two) times daily. Hold SBP<100, HR <60 90 tablet 1   Multiple Vitamin (MULTI-DAY PO) Take 1  tablet by mouth daily.      pantoprazole (PROTONIX) 40 MG tablet Take 1 tablet (40 mg total) by mouth daily. 30 tablet 1   pravastatin (PRAVACHOL) 40 MG tablet Take 1 tablet (40 mg total) by mouth at bedtime. 90 tablet 1   pyridOXINE (VITAMIN B-6) 100 MG tablet Take  100 mg by mouth daily.     tizanidine (ZANAFLEX) 2 MG capsule Take 4 mg by mouth at bedtime.     traMADol (ULTRAM) 50 MG tablet Take 2 tablets by mouth every 6 (six) hours as needed. Not to exceed 6 tablets a day     No current facility-administered medications on file prior to visit.     LABS/IMAGING: Results for orders placed or performed during the hospital encounter of 03/12/19 (from the past 48 hour(s))  Basic metabolic panel     Status: Abnormal   Collection Time: 03/12/19  5:06 PM  Result Value Ref Range   Sodium 134 (L) 135 - 145 mmol/L   Potassium 3.9 3.5 - 5.1 mmol/L   Chloride 98 98 - 111 mmol/L   CO2 25 22 - 32 mmol/L   Glucose, Bld 138 (H) 70 - 99 mg/dL   BUN 10 8 - 23 mg/dL   Creatinine, Ser 1.12 (H) 0.44 - 1.00 mg/dL   Calcium 9.2 8.9 - 10.3 mg/dL   GFR calc non Af Amer 49 (L) >60 mL/min   GFR calc Af Amer 56 (L) >60 mL/min   Anion gap 11 5 - 15    Comment: Performed at [facility], [place] [address], [place], [place] [address]  CBC     Status: Abnormal   Collection Time: 03/12/19  5:06 PM  Result Value Ref Range   WBC 13.3 (H) 4.0 - 10.5 K/uL   RBC 4.00 3.87 - 5.11 MIL/uL   Hemoglobin 11.7 (L) 12.0 - 15.0 g/dL   HCT 36.2 36.0 - 46.0 %   MCV 90.5 80.0 - 100.0 fL   MCH 29.3 26.0 - 34.0 pg   MCHC 32.3 30.0 - 36.0 g/dL   RDW 13.2 11.5 - 15.5 %   Platelets 329 150 - 400 K/uL   nRBC 0.0 0.0 - 0.2 %    Comment: Performed at [facility], [place] [address] [place], [place] [address]  Troponin I (High Sensitivity)     Status: None   Collection Time: 03/12/19  5:06 PM  Result Value Ref Range   Troponin I (High Sensitivity) 9 <18 ng/L    Comment: (NOTE) Elevated high sensitivity troponin I  (hsTnI) values and significant  changes across serial measurements may suggest ACS but many other  chronic and acute conditions are known to elevate hsTnI results.  Refer to the "Links" section for chest pain algorithms and additional  guidance. Performed at [facility], [place] [address], [place], [place] [address]   Troponin I (High Sensitivity)     Status: None   Collection Time: 03/12/19  6:30 PM  Result Value Ref Range   Troponin I (High Sensitivity) 9 <18 ng/L    Comment: (NOTE) Elevated high sensitivity troponin I (hsTnI) values and significant  changes across serial measurements may suggest ACS but many other  chronic and acute conditions are known to elevate hsTnI results.  Refer to the "Links" section for chest pain algorithms and additional  guidance. Performed at [facility], [place] [address], [place], [place] [address]    Dg Chest 2 View  Result Date: 03/12/2019 CLINICAL DATA:  73 year old female with history of central chest pain and shortness of breath for the past 3 weeks. EXAM: CHEST - 2 VIEW COMPARISON:  Chest x-ray 03/01/2019. FINDINGS: Lung volumes are normal. No consolidative airspace disease. No pleural effusions. No pneumothorax. No pulmonary nodule or mass noted. Pulmonary vasculature and the  cardiomediastinal silhouette are within normal limits. Aortic atherosclerosis. IMPRESSION: 1.  No radiographic evidence of acute cardiopulmonary disease. 2. Aortic atherosclerosis. Electronically Signed   By: Vinnie Langton M.D.   On: 03/12/2019 18:03    LIPID PANEL:    Component Value Date/Time   CHOL 189 02/22/2018 1006   TRIG 120.0 02/22/2018 1006   HDL 64.20 02/22/2018 1006   CHOLHDL 3 02/22/2018 1006   VLDL 24.0 02/22/2018 1006   LDLCALC 101 (H) 02/22/2018 1006     WEIGHTS: Wt Readings from Last 3 Encounters:  03/13/19 189 lb (85.7 kg)  03/01/19 189 lb (85.7 kg)  11/12/18 205 lb (93 kg)    VITALS: Pulse 88    Wt 189 lb (85.7 kg)    SpO2 97%     BMI 28.74 kg/m   EXAM: General appearance: alert and mild distress Neck: no carotid bruit, no JVD and thyroid not enlarged, symmetric, no tenderness/mass/nodules Lungs: clear to auscultation bilaterally Heart: regular rate and rhythm Abdomen: soft, non-tender; bowel sounds normal; no masses,  no organomegaly Extremities: extremities normal, atraumatic, no cyanosis or edema Pulses: 2+ and symmetric Skin: Skin color, texture, turgor normal. No rashes or lesions Neurologic: Grossly normal Psych: Anxious, appears in pain  EKG: Deferred  ASSESSMENT: 1. Atypical chest wall pain - recurrent 2. History of anxiety and fibromyalgia 3. Recent negative stress Myoview  PLAN: 1.   Ms. Brayton El is describing recurrent atypical chest wall pain over the xiphoid process which is sharp and is not improved with therapies.  She has been in the emergency department twice with work-up including troponins and other tests which have been negative.  Her EKG has been unchanged and is nonischemic.  She underwent nuclear stress testing earlier this month which showed no ischemia and normal LV function.  I discussed her case with Dr. Percival Spanish, primary cardiologist, who recommended definitive coronary imaging with CT coronary angiography.  We will go ahead and order that today.  She may need additional beta-blocker as her beta-blocker was recently reduced due to hypotension.  Her blood pressure was difficult to auscultate today and we could not obtain an accurate blood pressure in the office.  She says she was able to get it at home and in the emergency department yesterday was 950 systolic.  Plan follow-up with Dr. Percival Spanish after her CT angiogram.  Pixie Casino, MD, FACC, [place] Director of the [facility] Diplomate of the American Board of Clinical Lipidology Attending Cardiologist  Direct Dial: [phone]   Fax:  [phone]  Website:  [email or web address]   Nadean Corwin Shenicka Sunderlin 03/13/2019, 1:12 PM

## 2019-03-15 ENCOUNTER — Emergency Department (HOSPITAL_COMMUNITY): Payer: Medicare Other

## 2019-03-15 ENCOUNTER — Encounter (HOSPITAL_COMMUNITY): Admission: EM | Disposition: E | Payer: Self-pay | Source: Home / Self Care | Attending: Cardiothoracic Surgery

## 2019-03-15 ENCOUNTER — Emergency Department (HOSPITAL_COMMUNITY): Payer: Medicare Other | Admitting: Anesthesiology

## 2019-03-15 ENCOUNTER — Other Ambulatory Visit: Payer: Self-pay

## 2019-03-15 ENCOUNTER — Ambulatory Visit: Payer: Medicare Other | Admitting: Family

## 2019-03-15 ENCOUNTER — Encounter: Payer: Self-pay | Admitting: Family

## 2019-03-15 ENCOUNTER — Emergency Department (HOSPITAL_BASED_OUTPATIENT_CLINIC_OR_DEPARTMENT_OTHER): Payer: Medicare Other

## 2019-03-15 ENCOUNTER — Telehealth: Payer: Self-pay

## 2019-03-15 ENCOUNTER — Encounter (HOSPITAL_BASED_OUTPATIENT_CLINIC_OR_DEPARTMENT_OTHER): Payer: Self-pay | Admitting: *Deleted

## 2019-03-15 ENCOUNTER — Other Ambulatory Visit (HOSPITAL_BASED_OUTPATIENT_CLINIC_OR_DEPARTMENT_OTHER): Payer: Self-pay

## 2019-03-15 ENCOUNTER — Inpatient Hospital Stay (HOSPITAL_BASED_OUTPATIENT_CLINIC_OR_DEPARTMENT_OTHER)
Admission: EM | Admit: 2019-03-15 | Discharge: 2019-04-20 | DRG: 219 | Disposition: E | Payer: Medicare Other | Attending: Cardiothoracic Surgery | Admitting: Cardiothoracic Surgery

## 2019-03-15 VITALS — BP 107/75 | HR 118 | Temp 98.8°F | Resp 16 | Ht 68.0 in | Wt 176.0 lb

## 2019-03-15 DIAGNOSIS — Z978 Presence of other specified devices: Secondary | ICD-10-CM

## 2019-03-15 DIAGNOSIS — J9601 Acute respiratory failure with hypoxia: Secondary | ICD-10-CM | POA: Diagnosis not present

## 2019-03-15 DIAGNOSIS — J80 Acute respiratory distress syndrome: Secondary | ICD-10-CM | POA: Diagnosis not present

## 2019-03-15 DIAGNOSIS — R000 Tachycardia, unspecified: Secondary | ICD-10-CM

## 2019-03-15 DIAGNOSIS — D62 Acute posthemorrhagic anemia: Secondary | ICD-10-CM | POA: Diagnosis not present

## 2019-03-15 DIAGNOSIS — I639 Cerebral infarction, unspecified: Secondary | ICD-10-CM | POA: Diagnosis not present

## 2019-03-15 DIAGNOSIS — G40901 Epilepsy, unspecified, not intractable, with status epilepticus: Secondary | ICD-10-CM | POA: Diagnosis not present

## 2019-03-15 DIAGNOSIS — Z7982 Long term (current) use of aspirin: Secondary | ICD-10-CM | POA: Diagnosis not present

## 2019-03-15 DIAGNOSIS — I471 Supraventricular tachycardia: Secondary | ICD-10-CM | POA: Diagnosis not present

## 2019-03-15 DIAGNOSIS — G931 Anoxic brain damage, not elsewhere classified: Secondary | ICD-10-CM | POA: Diagnosis not present

## 2019-03-15 DIAGNOSIS — J90 Pleural effusion, not elsewhere classified: Secondary | ICD-10-CM

## 2019-03-15 DIAGNOSIS — J939 Pneumothorax, unspecified: Secondary | ICD-10-CM

## 2019-03-15 DIAGNOSIS — R079 Chest pain, unspecified: Secondary | ICD-10-CM | POA: Diagnosis present

## 2019-03-15 DIAGNOSIS — I4891 Unspecified atrial fibrillation: Secondary | ICD-10-CM | POA: Diagnosis present

## 2019-03-15 DIAGNOSIS — Z96653 Presence of artificial knee joint, bilateral: Secondary | ICD-10-CM | POA: Diagnosis present

## 2019-03-15 DIAGNOSIS — I7101 Dissection of thoracic aorta: Secondary | ICD-10-CM | POA: Diagnosis present

## 2019-03-15 DIAGNOSIS — E162 Hypoglycemia, unspecified: Secondary | ICD-10-CM | POA: Diagnosis not present

## 2019-03-15 DIAGNOSIS — Z9889 Other specified postprocedural states: Secondary | ICD-10-CM

## 2019-03-15 DIAGNOSIS — K219 Gastro-esophageal reflux disease without esophagitis: Secondary | ICD-10-CM | POA: Diagnosis present

## 2019-03-15 DIAGNOSIS — E785 Hyperlipidemia, unspecified: Secondary | ICD-10-CM | POA: Diagnosis present

## 2019-03-15 DIAGNOSIS — R1011 Right upper quadrant pain: Secondary | ICD-10-CM

## 2019-03-15 DIAGNOSIS — I314 Cardiac tamponade: Secondary | ICD-10-CM | POA: Diagnosis not present

## 2019-03-15 DIAGNOSIS — Z7952 Long term (current) use of systemic steroids: Secondary | ICD-10-CM

## 2019-03-15 DIAGNOSIS — Z79899 Other long term (current) drug therapy: Secondary | ICD-10-CM

## 2019-03-15 DIAGNOSIS — Z1159 Encounter for screening for other viral diseases: Secondary | ICD-10-CM | POA: Diagnosis not present

## 2019-03-15 DIAGNOSIS — Z0189 Encounter for other specified special examinations: Secondary | ICD-10-CM

## 2019-03-15 DIAGNOSIS — E872 Acidosis: Secondary | ICD-10-CM | POA: Diagnosis not present

## 2019-03-15 DIAGNOSIS — D689 Coagulation defect, unspecified: Secondary | ICD-10-CM | POA: Diagnosis present

## 2019-03-15 DIAGNOSIS — I313 Pericardial effusion (noninflammatory): Secondary | ICD-10-CM | POA: Diagnosis present

## 2019-03-15 DIAGNOSIS — N179 Acute kidney failure, unspecified: Secondary | ICD-10-CM | POA: Diagnosis not present

## 2019-03-15 DIAGNOSIS — Z22321 Carrier or suspected carrier of Methicillin susceptible Staphylococcus aureus: Secondary | ICD-10-CM

## 2019-03-15 DIAGNOSIS — Z66 Do not resuscitate: Secondary | ICD-10-CM | POA: Diagnosis present

## 2019-03-15 DIAGNOSIS — Z90710 Acquired absence of both cervix and uterus: Secondary | ICD-10-CM | POA: Diagnosis not present

## 2019-03-15 DIAGNOSIS — Z8249 Family history of ischemic heart disease and other diseases of the circulatory system: Secondary | ICD-10-CM

## 2019-03-15 DIAGNOSIS — G9340 Encephalopathy, unspecified: Secondary | ICD-10-CM

## 2019-03-15 DIAGNOSIS — I71019 Dissection of thoracic aorta, unspecified: Secondary | ICD-10-CM | POA: Diagnosis present

## 2019-03-15 DIAGNOSIS — D696 Thrombocytopenia, unspecified: Secondary | ICD-10-CM | POA: Diagnosis not present

## 2019-03-15 DIAGNOSIS — M797 Fibromyalgia: Secondary | ICD-10-CM | POA: Diagnosis present

## 2019-03-15 DIAGNOSIS — K8020 Calculus of gallbladder without cholecystitis without obstruction: Secondary | ICD-10-CM

## 2019-03-15 DIAGNOSIS — E8770 Fluid overload, unspecified: Secondary | ICD-10-CM | POA: Diagnosis not present

## 2019-03-15 DIAGNOSIS — Z09 Encounter for follow-up examination after completed treatment for conditions other than malignant neoplasm: Secondary | ICD-10-CM

## 2019-03-15 DIAGNOSIS — I10 Essential (primary) hypertension: Secondary | ICD-10-CM | POA: Diagnosis present

## 2019-03-15 DIAGNOSIS — R0600 Dyspnea, unspecified: Secondary | ICD-10-CM

## 2019-03-15 DIAGNOSIS — E876 Hypokalemia: Secondary | ICD-10-CM | POA: Diagnosis not present

## 2019-03-15 DIAGNOSIS — R0902 Hypoxemia: Secondary | ICD-10-CM | POA: Diagnosis not present

## 2019-03-15 DIAGNOSIS — R570 Cardiogenic shock: Secondary | ICD-10-CM | POA: Diagnosis not present

## 2019-03-15 DIAGNOSIS — G40A01 Absence epileptic syndrome, not intractable, with status epilepticus: Secondary | ICD-10-CM | POA: Diagnosis present

## 2019-03-15 DIAGNOSIS — R569 Unspecified convulsions: Secondary | ICD-10-CM | POA: Diagnosis not present

## 2019-03-15 HISTORY — PX: REPLACEMENT ASCENDING AORTA: SHX6068

## 2019-03-15 LAB — CBC WITH DIFFERENTIAL/PLATELET
Abs Immature Granulocytes: 0.05 10*3/uL (ref 0.00–0.07)
Basophils Absolute: 0 10*3/uL (ref 0.0–0.1)
Basophils Relative: 0 %
Eosinophils Absolute: 0 10*3/uL (ref 0.0–0.5)
Eosinophils Relative: 0 %
HCT: 36.7 % (ref 36.0–46.0)
Hemoglobin: 11.8 g/dL — ABNORMAL LOW (ref 12.0–15.0)
Immature Granulocytes: 1 %
Lymphocytes Relative: 13 %
Lymphs Abs: 1.4 10*3/uL (ref 0.7–4.0)
MCH: 28.8 pg (ref 26.0–34.0)
MCHC: 32.2 g/dL (ref 30.0–36.0)
MCV: 89.5 fL (ref 80.0–100.0)
Monocytes Absolute: 1 10*3/uL (ref 0.1–1.0)
Monocytes Relative: 9 %
Neutro Abs: 8.4 10*3/uL — ABNORMAL HIGH (ref 1.7–7.7)
Neutrophils Relative %: 77 %
Platelets: 414 10*3/uL — ABNORMAL HIGH (ref 150–400)
RBC: 4.1 MIL/uL (ref 3.87–5.11)
RDW: 13.4 % (ref 11.5–15.5)
WBC: 11 10*3/uL — ABNORMAL HIGH (ref 4.0–10.5)
nRBC: 0 % (ref 0.0–0.2)

## 2019-03-15 LAB — PREPARE RBC (CROSSMATCH)

## 2019-03-15 LAB — COMPREHENSIVE METABOLIC PANEL
ALT: 180 U/L — ABNORMAL HIGH (ref 0–44)
AST: 295 U/L — ABNORMAL HIGH (ref 15–41)
Albumin: 3.4 g/dL — ABNORMAL LOW (ref 3.5–5.0)
Alkaline Phosphatase: 102 U/L (ref 38–126)
Anion gap: 14 (ref 5–15)
BUN: 14 mg/dL (ref 8–23)
CO2: 23 mmol/L (ref 22–32)
Calcium: 8.8 mg/dL — ABNORMAL LOW (ref 8.9–10.3)
Chloride: 95 mmol/L — ABNORMAL LOW (ref 98–111)
Creatinine, Ser: 1.03 mg/dL — ABNORMAL HIGH (ref 0.44–1.00)
GFR calc Af Amer: >60 mL/min (ref >60)
GFR calc non Af Amer: 54 mL/min — ABNORMAL LOW (ref >60)
Glucose, Bld: 118 mg/dL — ABNORMAL HIGH (ref 70–99)
Potassium: 3.2 mmol/L — ABNORMAL LOW (ref 3.5–5.1)
Sodium: 132 mmol/L — ABNORMAL LOW (ref 135–145)
Total Bilirubin: 0.7 mg/dL (ref 0.3–1.2)
Total Protein: 7.7 g/dL (ref 6.5–8.1)

## 2019-03-15 LAB — D-DIMER, QUANTITATIVE: D-Dimer, Quant: 7.23 ug/mL-FEU — ABNORMAL HIGH (ref 0.00–0.50)

## 2019-03-15 LAB — LIPASE, BLOOD: Lipase: 24 U/L (ref 11–51)

## 2019-03-15 LAB — ABO/RH: ABO/RH(D): A POS

## 2019-03-15 LAB — SARS CORONAVIRUS 2 AG (30 MIN TAT): SARS Coronavirus 2 Ag: NEGATIVE

## 2019-03-15 SURGERY — REPLACEMENT, AORTA, ASCENDING
Anesthesia: General | Site: Chest

## 2019-03-15 MED ORDER — POTASSIUM CHLORIDE 2 MEQ/ML IV SOLN
80.0000 meq | INTRAVENOUS | Status: DC
  Filled 2019-03-15 (×2): qty 40

## 2019-03-15 MED ORDER — LIDOCAINE 2% (20 MG/ML) 5 ML SYRINGE
INTRAMUSCULAR | Status: DC | PRN
  Administered 2019-03-15: 80 mg via INTRAVENOUS

## 2019-03-15 MED ORDER — HEPARIN SODIUM (PORCINE) 1000 UNIT/ML IJ SOLN
INTRAMUSCULAR | Status: DC | PRN
  Administered 2019-03-15: 4000 [IU] via INTRAVENOUS
  Administered 2019-03-15: 25000 [IU] via INTRAVENOUS

## 2019-03-15 MED ORDER — HEMOSTATIC AGENTS (NO CHARGE) OPTIME
TOPICAL | Status: DC | PRN
  Administered 2019-03-15 – 2019-03-16 (×6): 1 via TOPICAL
  Administered 2019-03-16: 2 via TOPICAL

## 2019-03-15 MED ORDER — MIDAZOLAM HCL 5 MG/5ML IJ SOLN
INTRAMUSCULAR | Status: DC | PRN
  Administered 2019-03-15 – 2019-03-16 (×2): 2 mg via INTRAVENOUS

## 2019-03-15 MED ORDER — HYDROCORTISONE NA SUCCINATE PF 100 MG IJ SOLR
INTRAMUSCULAR | Status: AC
  Filled 2019-03-15: qty 4

## 2019-03-15 MED ORDER — CHLORHEXIDINE GLUCONATE 0.12 % MT SOLN: 15.0000 mL | Freq: Once | OROMUCOSAL | Status: DC

## 2019-03-15 MED ORDER — BISACODYL 5 MG PO TBEC: 5.0000 mg | DELAYED_RELEASE_TABLET | Freq: Once | ORAL | Status: DC

## 2019-03-15 MED ORDER — FENTANYL CITRATE (PF) 250 MCG/5ML IJ SOLN
INTRAMUSCULAR | Status: AC
  Filled 2019-03-15: qty 5

## 2019-03-15 MED ORDER — PHENYLEPHRINE HCL (PRESSORS) 10 MG/ML IV SOLN
INTRAVENOUS | Status: DC | PRN
  Administered 2019-03-15 (×5): 40 ug via INTRAVENOUS

## 2019-03-15 MED ORDER — NITROGLYCERIN IN D5W 200-5 MCG/ML-% IV SOLN
2.0000 ug/min | INTRAVENOUS | Status: AC
  Administered 2019-03-15: 10 ug/min via INTRAVENOUS
  Filled 2019-03-15: qty 250

## 2019-03-15 MED ORDER — DIPHENHYDRAMINE HCL 25 MG PO CAPS: 50.0000 mg | ORAL_CAPSULE | Freq: Once | ORAL | Status: AC

## 2019-03-15 MED ORDER — ROCURONIUM BROMIDE 10 MG/ML (PF) SYRINGE
PREFILLED_SYRINGE | INTRAVENOUS | Status: DC | PRN
  Administered 2019-03-15 (×2): 50 mg via INTRAVENOUS
  Administered 2019-03-16: 20 mg via INTRAVENOUS
  Administered 2019-03-16: 50 mg via INTRAVENOUS
  Administered 2019-03-16: 30 mg via INTRAVENOUS
  Administered 2019-03-16: 50 mg via INTRAVENOUS

## 2019-03-15 MED ORDER — 0.9 % SODIUM CHLORIDE (POUR BTL) OPTIME
TOPICAL | Status: DC | PRN
  Administered 2019-03-15: 5000 mL

## 2019-03-15 MED ORDER — IOHEXOL 350 MG/ML SOLN
100.0000 mL | Freq: Once | INTRAVENOUS | Status: AC | PRN
  Administered 2019-03-15: 19:00:00 80 mL via INTRAVENOUS

## 2019-03-15 MED ORDER — PROPOFOL 10 MG/ML IV BOLUS
INTRAVENOUS | Status: DC | PRN
  Administered 2019-03-15: 200 mg via INTRAVENOUS
  Administered 2019-03-16: 100 mg via INTRAVENOUS

## 2019-03-15 MED ORDER — DEXMEDETOMIDINE HCL IN NACL 400 MCG/100ML IV SOLN
0.1000 ug/kg/h | INTRAVENOUS | Status: AC
  Administered 2019-03-15: .2 ug/kg/h via INTRAVENOUS
  Filled 2019-03-15 (×2): qty 100

## 2019-03-15 MED ORDER — DOPAMINE-DEXTROSE 3.2-5 MG/ML-% IV SOLN
0.0000 ug/kg/min | INTRAVENOUS | Status: AC
  Administered 2019-03-16: 10 ug/kg/min via INTRAVENOUS
  Filled 2019-03-15: qty 250

## 2019-03-15 MED ORDER — DIPHENHYDRAMINE HCL 50 MG/ML IJ SOLN
50.0000 mg | Freq: Once | INTRAMUSCULAR | Status: AC
  Administered 2019-03-15: 50 mg via INTRAVENOUS
  Filled 2019-03-15: qty 1

## 2019-03-15 MED ORDER — TRANEXAMIC ACID (OHS) PUMP PRIME SOLUTION
2.0000 mg/kg | INTRAVENOUS | Status: DC
  Filled 2019-03-15 (×2): qty 1.6

## 2019-03-15 MED ORDER — ARTIFICIAL TEARS OPHTHALMIC OINT
TOPICAL_OINTMENT | OPHTHALMIC | Status: DC | PRN
  Administered 2019-03-15: 1 via OPHTHALMIC

## 2019-03-15 MED ORDER — HYDROMORPHONE HCL 1 MG/ML IJ SOLN
0.5000 mg | Freq: Once | INTRAMUSCULAR | Status: AC
  Administered 2019-03-15: 0.5 mg via INTRAVENOUS
  Filled 2019-03-15: qty 1

## 2019-03-15 MED ORDER — SODIUM CHLORIDE 0.9 % IV BOLUS
1000.0000 mL | Freq: Once | INTRAVENOUS | Status: AC
  Administered 2019-03-15: 1000 mL via INTRAVENOUS

## 2019-03-15 MED ORDER — TRANEXAMIC ACID 1000 MG/10ML IV SOLN
1.5000 mg/kg/h | INTRAVENOUS | Status: AC
  Administered 2019-03-15: 15 mg/kg/h via INTRAVENOUS
  Filled 2019-03-15 (×2): qty 25

## 2019-03-15 MED ORDER — HYDROCORTISONE NA SUCCINATE PF 250 MG IJ SOLR
200.0000 mg | Freq: Once | INTRAMUSCULAR | Status: AC
  Administered 2019-03-15: 14:00:00 200 mg via INTRAVENOUS
  Filled 2019-03-15: qty 200

## 2019-03-15 MED ORDER — EPINEPHRINE PF 1 MG/ML IJ SOLN
0.0000 ug/min | INTRAVENOUS | Status: DC
  Filled 2019-03-15 (×2): qty 4

## 2019-03-15 MED ORDER — CHLORHEXIDINE GLUCONATE CLOTH 2 % EX PADS: 6.0000 | MEDICATED_PAD | Freq: Once | CUTANEOUS | Status: DC

## 2019-03-15 MED ORDER — FENTANYL CITRATE (PF) 250 MCG/5ML IJ SOLN
INTRAMUSCULAR | Status: DC | PRN
  Administered 2019-03-15: 250 ug via INTRAVENOUS
  Administered 2019-03-15: 150 ug via INTRAVENOUS
  Administered 2019-03-15 (×3): 100 ug via INTRAVENOUS
  Administered 2019-03-15: 250 ug via INTRAVENOUS
  Administered 2019-03-15 – 2019-03-16 (×2): 150 ug via INTRAVENOUS

## 2019-03-15 MED ORDER — SODIUM CHLORIDE 0.9 % IV SOLN
750.0000 mg | INTRAVENOUS | Status: AC
  Administered 2019-03-16: 750 mg via INTRAVENOUS
  Filled 2019-03-15 (×2): qty 750

## 2019-03-15 MED ORDER — PROPOFOL 10 MG/ML IV BOLUS
INTRAVENOUS | Status: AC
  Filled 2019-03-15: qty 20

## 2019-03-15 MED ORDER — TRANEXAMIC ACID (OHS) BOLUS VIA INFUSION
15.0000 mg/kg | INTRAVENOUS | Status: DC
  Filled 2019-03-15 (×2): qty 1197

## 2019-03-15 MED ORDER — PLASMA-LYTE 148 IV SOLN
INTRAVENOUS | Status: AC
  Administered 2019-03-15: 500 mL
  Filled 2019-03-15 (×2): qty 2.5

## 2019-03-15 MED ORDER — LACTATED RINGERS IV SOLN
INTRAVENOUS | Status: DC | PRN
  Administered 2019-03-15 – 2019-03-16 (×3): via INTRAVENOUS

## 2019-03-15 MED ORDER — VANCOMYCIN HCL 10 G IV SOLR
1250.0000 mg | INTRAVENOUS | Status: DC
  Filled 2019-03-15 (×2): qty 1250

## 2019-03-15 MED ORDER — LORAZEPAM 2 MG/ML IJ SOLN
0.5000 mg | Freq: Once | INTRAMUSCULAR | Status: AC
  Administered 2019-03-15: 20:00:00 0.5 mg via INTRAVENOUS
  Filled 2019-03-15: qty 1

## 2019-03-15 MED ORDER — METOPROLOL TARTRATE 12.5 MG HALF TABLET: 12.5000 mg | ORAL_TABLET | Freq: Once | ORAL | Status: DC

## 2019-03-15 MED ORDER — TEMAZEPAM 15 MG PO CAPS: 15.0000 mg | ORAL_CAPSULE | Freq: Once | ORAL | Status: DC | PRN

## 2019-03-15 MED ORDER — IOHEXOL 350 MG/ML SOLN
100.0000 mL | Freq: Once | INTRAVENOUS | Status: AC | PRN
  Administered 2019-03-15: 100 mL via INTRAVENOUS

## 2019-03-15 MED ORDER — SODIUM CHLORIDE 0.9 % IV SOLN
INTRAVENOUS | Status: DC
  Filled 2019-03-15 (×2): qty 30

## 2019-03-15 MED ORDER — SODIUM CHLORIDE (PF) 0.9 % IJ SOLN
OROMUCOSAL | Status: DC | PRN
  Administered 2019-03-15 (×3): 4 mL via TOPICAL

## 2019-03-15 MED ORDER — VANCOMYCIN HCL 1000 MG IV SOLR
INTRAVENOUS | Status: DC | PRN
  Administered 2019-03-15: 1250 mg via INTRAVENOUS

## 2019-03-15 MED ORDER — SODIUM CHLORIDE 0.9% IV SOLUTION: Freq: Once | INTRAVENOUS | Status: DC

## 2019-03-15 MED ORDER — MILRINONE LACTATE IN DEXTROSE 20-5 MG/100ML-% IV SOLN
0.3000 ug/kg/min | INTRAVENOUS | Status: AC
  Administered 2019-03-16: 0.125 ug/kg/min via INTRAVENOUS
  Filled 2019-03-15 (×2): qty 100

## 2019-03-15 MED ORDER — INSULIN REGULAR(HUMAN) IN NACL 100-0.9 UT/100ML-% IV SOLN
INTRAVENOUS | Status: AC
  Administered 2019-03-15: 1 [IU]/h via INTRAVENOUS
  Filled 2019-03-15: qty 100

## 2019-03-15 MED ORDER — SODIUM CHLORIDE 0.9 % IV SOLN
1.5000 g | INTRAVENOUS | Status: AC
  Administered 2019-03-15: 1.5 g via INTRAVENOUS
  Filled 2019-03-15 (×3): qty 1.5

## 2019-03-15 MED ORDER — PHENYLEPHRINE HCL-NACL 20-0.9 MG/250ML-% IV SOLN
30.0000 ug/min | INTRAVENOUS | Status: AC
  Administered 2019-03-15: 100 ug/min via INTRAVENOUS
  Filled 2019-03-15 (×2): qty 250

## 2019-03-15 MED ORDER — SUCCINYLCHOLINE CHLORIDE 20 MG/ML IJ SOLN
INTRAMUSCULAR | Status: DC | PRN
  Administered 2019-03-15: 120 mg via INTRAVENOUS

## 2019-03-15 MED ORDER — NOREPINEPHRINE 4 MG/250ML-% IV SOLN
0.0000 ug/min | INTRAVENOUS | Status: DC
  Filled 2019-03-15 (×2): qty 250

## 2019-03-15 MED ORDER — MIDAZOLAM HCL 2 MG/2ML IJ SOLN
INTRAMUSCULAR | Status: AC
  Filled 2019-03-15: qty 2

## 2019-03-15 MED ORDER — MAGNESIUM SULFATE 50 % IJ SOLN
40.0000 meq | INTRAMUSCULAR | Status: DC
  Filled 2019-03-15: qty 10

## 2019-03-15 SURGICAL SUPPLY — 133 items
ADAPTER CARDIO PERF ANTE/RETRO (ADAPTER) ×3 IMPLANT
ADH SRG 12 PREFL SYR 3 SPRDR (MISCELLANEOUS) ×1
APL SRG 7X2 LUM MLBL SLNT (VASCULAR PRODUCTS) ×2
APPLICATOR TIP COSEAL (VASCULAR PRODUCTS) ×4 IMPLANT
BAG DECANTER FOR FLEXI CONT (MISCELLANEOUS) ×3 IMPLANT
BANDAGE ACE 6X5 VEL STRL LF (GAUZE/BANDAGES/DRESSINGS) ×3 IMPLANT
BANDAGE HEMOSTAT MRDH 4X4 STRL (MISCELLANEOUS) IMPLANT
BLADE CLIPPER SURG (BLADE) IMPLANT
BLADE STERNUM SYSTEM 6 (BLADE) ×3 IMPLANT
BLADE SURG 12 STRL SS (BLADE) ×3 IMPLANT
BLANKET WARM CARDIAC ADLT BAIR (MISCELLANEOUS) ×2 IMPLANT
BNDG HEMOSTAT MRDH 4X4 STRL (MISCELLANEOUS) ×3
CANISTER SUCT 3000ML PPV (MISCELLANEOUS) ×3 IMPLANT
CANN PRFSN 3/8XRT ANG TPR 14 (MISCELLANEOUS) ×1
CANNULA EZ GLIDE AORTIC 21FR (CANNULA) ×2 IMPLANT
CANNULA GUNDRY RCSP 15FR (MISCELLANEOUS) ×5 IMPLANT
CANNULA PRFSN 3/8XRT ANG TPR14 (MISCELLANEOUS) IMPLANT
CANNULA SNGLE STGE 40FR VENOUS (CANNULA) ×2 IMPLANT
CANNULA SUMP PERICARDIAL (CANNULA) ×2 IMPLANT
CANNULA VEN MTL TIP RT (MISCELLANEOUS) ×2
CANNULA VRC MALB SNGL STG 28FR (MISCELLANEOUS) IMPLANT
CATH CPB KIT VANTRIGT (MISCELLANEOUS) ×3 IMPLANT
CATH ROBINSON RED A/P 18FR (CATHETERS) ×9 IMPLANT
CATH THORACIC 28FR RT ANG (CATHETERS) ×4 IMPLANT
CATH THORACIC 36FR RT ANG (CATHETERS) ×3 IMPLANT
CATH/SQUID NICHOLS JEHLE COR (CATHETERS) ×2 IMPLANT
CAUTERY SURG HI TEMP FINE TIP (MISCELLANEOUS) ×2 IMPLANT
CONN 3/8X1/2 ST GISH (MISCELLANEOUS) ×2 IMPLANT
CONN 3/8X3/8 GISH STERILE (MISCELLANEOUS) ×4 IMPLANT
CONN ST 1/4X3/8  BEN (MISCELLANEOUS) ×4
CONN ST 1/4X3/8 BEN (MISCELLANEOUS) IMPLANT
CONNECTOR 1/2X3/8X1/2 3 WAY (MISCELLANEOUS) ×2
CONNECTOR 1/2X3/8X1/2 3WAY (MISCELLANEOUS) IMPLANT
CONT SPEC 4OZ CLIKSEAL STRL BL (MISCELLANEOUS) ×2 IMPLANT
COUNTER NEEDLE 20 DBL MAG RED (NEEDLE) ×2 IMPLANT
DRAIN CHANNEL 32F RND 10.7 FF (WOUND CARE) ×3 IMPLANT
DRAPE CARDIOVASCULAR INCISE (DRAPES) ×2
DRAPE SRG 135X102X78XABS (DRAPES) ×1 IMPLANT
DRSG AQUACEL AG ADV 3.5X14 (GAUZE/BANDAGES/DRESSINGS) ×3 IMPLANT
ELECT BLADE 4.0 EZ CLEAN MEGAD (MISCELLANEOUS) ×3
ELECT BLADE 6.5 EXT (BLADE) ×3 IMPLANT
ELECT CAUTERY BLADE 6.4 (BLADE) ×3 IMPLANT
ELECT REM PT RETURN 9FT ADLT (ELECTROSURGICAL) ×6
ELECTRODE BLDE 4.0 EZ CLN MEGD (MISCELLANEOUS) ×1 IMPLANT
ELECTRODE REM PT RTRN 9FT ADLT (ELECTROSURGICAL) ×2 IMPLANT
FELT TEFLON 1X6 (MISCELLANEOUS) ×6 IMPLANT
FEMORAL VENOUS CANN RAP (CANNULA) ×2 IMPLANT
GAUZE SPONGE 4X4 12PLY STRL (GAUZE/BANDAGES/DRESSINGS) ×4 IMPLANT
GLOVE BIO SURGEON STRL SZ7.5 (GLOVE) ×9 IMPLANT
GLOVE SURG SS PI 7.5 STRL IVOR (GLOVE) ×4 IMPLANT
GOWN STRL REUS W/ TWL LRG LVL3 (GOWN DISPOSABLE) ×4 IMPLANT
GOWN STRL REUS W/TWL LRG LVL3 (GOWN DISPOSABLE) ×16
GRAFT CV 30X8WVN NDL (Graft) IMPLANT
GRAFT HEMASHIELD 8MM (Graft) ×2 IMPLANT
GRAFT WOVEN D/V 28DX30L (Vascular Products) ×2 IMPLANT
GRAFT WOVEN D/V 30DX30L (Vascular Products) ×2 IMPLANT
HANDLE STAPLE ENDO GIA SHORT (STAPLE) ×1
HEMOSTAT POWDER SURGIFOAM 1G (HEMOSTASIS) ×9 IMPLANT
HEMOSTAT SURGICEL 2X14 (HEMOSTASIS) ×3 IMPLANT
HEMOSTAT SURGICEL 2X4 FIBR (HEMOSTASIS) ×2 IMPLANT
INSERT FOGARTY SM (MISCELLANEOUS) ×2 IMPLANT
INSERT FOGARTY XLG (MISCELLANEOUS) ×2 IMPLANT
KIT BASIN OR (CUSTOM PROCEDURE TRAY) ×3 IMPLANT
KIT DILATOR VASC 18G NDL (KITS) ×2 IMPLANT
KIT DRAINAGE VACCUM ASSIST (KITS) ×2 IMPLANT
KIT SUCTION CATH 14FR (SUCTIONS) ×3 IMPLANT
KIT TURNOVER KIT B (KITS) ×3 IMPLANT
KIT VASOVIEW HEMOPRO 2 VH 4000 (KITS) ×3 IMPLANT
LEAD PACING MYOCARDI (MISCELLANEOUS) ×3 IMPLANT
LINE VENT (MISCELLANEOUS) ×2 IMPLANT
LOOP VESSEL SUPERMAXI WHITE (MISCELLANEOUS) ×2 IMPLANT
NS IRRIG 1000ML POUR BTL (IV SOLUTION) ×15 IMPLANT
PACK E OPEN HEART (SUTURE) ×3 IMPLANT
PACK OPEN HEART (CUSTOM PROCEDURE TRAY) ×3 IMPLANT
PAD ARMBOARD 7.5X6 YLW CONV (MISCELLANEOUS) ×6 IMPLANT
PAD ELECT DEFIB RADIOL ZOLL (MISCELLANEOUS) ×3 IMPLANT
POSITIONER HEAD DONUT 9IN (MISCELLANEOUS) ×3 IMPLANT
POWDER SURGICEL 3.0 GRAM (HEMOSTASIS) ×2 IMPLANT
PUNCH AORTIC ROTATE 4.0MM (MISCELLANEOUS) IMPLANT
PUNCH AORTIC ROTATE 4.5MM 8IN (MISCELLANEOUS) IMPLANT
PUNCH AORTIC ROTATE 5MM 8IN (MISCELLANEOUS) IMPLANT
RELOAD TRI 2.0 30 VAS MED SUL (STAPLE) ×2 IMPLANT
SEALANT SURG COSEAL 8ML (VASCULAR PRODUCTS) ×2 IMPLANT
SET CARDIOPLEGIA MPS 5001102 (MISCELLANEOUS) ×2 IMPLANT
SHEATH PINNACLE 5F 10CM (SHEATH) ×2 IMPLANT
SPONGE LAP 18X18 RF (DISPOSABLE) ×8 IMPLANT
SPONGE LAP 4X18 RFD (DISPOSABLE) ×10 IMPLANT
STAPLER ENDO GIA 12 SHRT THIN (STAPLE) IMPLANT
STAPLER ENDO GIA 12MM SHORT (STAPLE) ×2 IMPLANT
STOPCOCK 4 WAY LG BORE MALE ST (IV SETS) ×2 IMPLANT
SURGIFLO W/THROMBIN 8M KIT (HEMOSTASIS) ×9 IMPLANT
SUT BONE WAX W31G (SUTURE) ×3 IMPLANT
SUT MNCRL AB 4-0 PS2 18 (SUTURE) IMPLANT
SUT PROLENE 3 0 RB 1 (SUTURE) ×2 IMPLANT
SUT PROLENE 3 0 SH DA (SUTURE) ×18 IMPLANT
SUT PROLENE 3 0 SH1 36 (SUTURE) ×4 IMPLANT
SUT PROLENE 4 0 RB 1 (SUTURE) ×144
SUT PROLENE 4 0 SH DA (SUTURE) ×67 IMPLANT
SUT PROLENE 4-0 RB1 .5 CRCL 36 (SUTURE) ×1 IMPLANT
SUT PROLENE 5 0 C 1 36 (SUTURE) ×10 IMPLANT
SUT PROLENE 6 0 C 1 30 (SUTURE) ×4 IMPLANT
SUT PROLENE 6 0 CC (SUTURE) ×4 IMPLANT
SUT PROLENE 8 0 BV175 6 (SUTURE) IMPLANT
SUT SILK  1 MH (SUTURE) ×2
SUT SILK 1 MH (SUTURE) IMPLANT
SUT SILK 1 TIES 10X30 (SUTURE) ×2 IMPLANT
SUT SILK 2 0 SH CR/8 (SUTURE) ×2 IMPLANT
SUT SILK 3 0 SH CR/8 (SUTURE) IMPLANT
SUT STEEL 6MS V (SUTURE) ×6 IMPLANT
SUT STEEL SZ 6 DBL 3X14 BALL (SUTURE) ×3 IMPLANT
SUT VIC AB 1 CTX 18 (SUTURE) ×2 IMPLANT
SUT VIC AB 1 CTX 36 (SUTURE) ×6
SUT VIC AB 1 CTX36XBRD ANBCTR (SUTURE) ×2 IMPLANT
SUT VIC AB 2-0 CT1 27 (SUTURE)
SUT VIC AB 2-0 CT1 TAPERPNT 27 (SUTURE) IMPLANT
SUT VIC AB 2-0 CTX 27 (SUTURE) ×2 IMPLANT
SUT VIC AB 3-0 X1 27 (SUTURE) IMPLANT
SYR 10ML KIT SKIN ADHESIVE (MISCELLANEOUS) ×2 IMPLANT
SYSTEM SAHARA CHEST DRAIN ATS (WOUND CARE) ×3 IMPLANT
TAPE CLOTH SURG 4X10 WHT LF (GAUZE/BANDAGES/DRESSINGS) ×2 IMPLANT
TAPE PAPER 2X10 WHT MICROPORE (GAUZE/BANDAGES/DRESSINGS) ×2 IMPLANT
TOWEL GREEN STERILE (TOWEL DISPOSABLE) ×3 IMPLANT
TOWEL GREEN STERILE FF (TOWEL DISPOSABLE) ×3 IMPLANT
TRAY CATH LUMEN 1 20CM STRL (SET/KITS/TRAYS/PACK) ×2 IMPLANT
TRAY FOLEY SLVR 16FR TEMP STAT (SET/KITS/TRAYS/PACK) ×3 IMPLANT
TUBE CONNECTING 12'X1/4 (SUCTIONS) ×1
TUBE CONNECTING 12X1/4 (SUCTIONS) ×1 IMPLANT
TUBING ART PRESS 48 MALE/FEM (TUBING) ×4 IMPLANT
TUBING LAP HI FLOW INSUFFLATIO (TUBING) ×3 IMPLANT
UNDERPAD 30X30 (UNDERPADS AND DIAPERS) ×3 IMPLANT
VRC MALLEABLE SINGLE STG 28FR (MISCELLANEOUS) ×3
WATER STERILE IRR 1000ML POUR (IV SOLUTION) ×6 IMPLANT
YANKAUER SUCT BULB TIP NO VENT (SUCTIONS) ×4 IMPLANT

## 2019-03-15 NOTE — H&P (Signed)
Madeline [name]       Madeline [name],Madeline [name] [id number]             [phone]        Madeline [name] [place] Medical Record #[id number] Date of Birth: 19-Oct-1945  Referring: No ref. provider found Primary Care: Mosie Lukes, MD Primary Cardiologist:James Hochrein, MD  Chief Complaint:   Chest pain Chief Complaint  Patient presents with   Abdominal Pain    History of Present Illness:     Patient examined, images of CTA of chest done today personally reviewed and discussed with patient.  I also reviewed the results of her previous echocardiogram and cardiac stress test.  73 year old female non-smoker with hypertension with persistent epigastric penetrating chest pain for the past few weeks.  She has been to the emergency department for evaluation on several occasions with negative enzymes.  An echocardiogram was performed showing no evidence of valvular heart disease or pericardial effusion, LVH was noted consistent with her history of hypertension.  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  The hematoma extended down towards the root but did not extend into the arch or descending thoracic aorta.  I was asked to see the patient in consultation and after reviewing the CTA at the patient transferred to this hospital for emergency surgery. Patient's father had a aortic dissection 43 to 20 years ago and apparently expired. Patient's had previous surgery including bilateral total knee replacement without anesthetic problems. Cardiac Myoview showed no evidence of coronary ischemia.  Her aortic valve was tricuspid without significant insufficiency.     Current Activity/ Functional Status: Patient lives with her husband and is fairly functional.   Zubrod Score: At the time of surgery this patients most appropriate  activity status/level should be described as: []     0    Normal activity, no symptoms []     1    Restricted in physical strenuous activity but ambulatory, able to do out light work [x]     2    Ambulatory and capable of self care, unable to do work activities, up and about                 more than 50%  Of the time                            []     3    Only limited self care, in bed greater than 50% of waking hours []     4    Completely disabled, no self care, confined to bed or chair []     5    Moribund  Past Medical History:  Diagnosis Date   Anxiety    Arthritis 09-14-11   osteoarthritis, osteopenia   Cataracts, both eyes 09-13-11   not surgical yet   Cholelithiasis 05/17/2016   Chronic neck pain 05/29/2016   Cystitis 2007   sepsis post bladder biopsy, Dr Jeffie Pollock   Fibromyalgia 09-14-11   Dr Donney Dice, [facility]   Fractures 09/14/2011   toes-left foot   GERD (gastroesophageal reflux disease) 09-14-11   tx. Nexium   H/O foot surgery 12/19/2011   Hiatal hernia 05/17/2016   Hyperglycemia 08/23/2016   Hyperlipidemia    Hypertension 09-13-11   tx. 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   bilateral , with tenderness left shin bone near foot    Past Surgical History:  Procedure Laterality Date   ABDOMINAL HYSTERECTOMY  1977   dysfunctional menses; age 78   [place]   COLONOSCOPY  2011   negative   CYSTOSTOMY W/ BLADDER BIOPSY  04-2006   FOOT ARTHRODESIS  01/03/12   [facility]-- left hallux MP joint arthrodesis   HAMMER TOE SURGERY  01/03/12   [facility], Dr Doran Durand   NASAL SINUS SURGERY  09-14-11   right side " sinus lift"   SIGMOIDOSCOPY     sling procedure  2006   a and p repair   TONSILLECTOMY     TOTAL KNEE ARTHROPLASTY  09/21/2011/[facility]   Procedure: TOTAL KNEE  ARTHROPLASTY;  Surgeon: Gearlean Alf;  Location: [facility];  Service: Orthopedics;  Laterality: Left;   TOTAL KNEE ARTHROPLASTY  06/25/2012   Procedure: TOTAL KNEE ARTHROPLASTY;  Surgeon: Gearlean Alf, MD;  Location: [facility];  Service: Orthopedics;  Laterality: Right;    Social History   Tobacco Use  Smoking Status Never Smoker  Smokeless Tobacco Never Used    Social History   Substance and Sexual Activity  Alcohol Use Not Currently     Allergies  Allergen Reactions   Ivp Dye [Iodinated Diagnostic Agents]     Rash , hives   Codeine Itching    Unless something given to counteract itching    Current Facility-Administered Medications  Medication Dose Route Frequency Provider Last Rate Last Dose   0.9 %  sodium chloride infusion (Manually program via Guardrails IV Fluids)   Intravenous Once Valetta Fuller, CRNA       0.9 % irrigation (POUR BTL)    PRN Prescott Gum, Collier Salina, MD   5,000 mL at 02/21/2019 2143   bisacodyl (DULCOLAX) EC tablet 5 mg  5 mg Oral Once Prescott Gum, Collier Salina, MD       cefUROXime (ZINACEF) 750 mg in sodium chloride 0.9 % 100 mL IVPB  750 mg Intravenous To OR Prescott Gum, Collier Salina, MD       [START ON 03/16/2019] chlorhexidine (PERIDEX) 0.12 % solution 15 mL  15 mL Mouth/Throat Once Prescott Gum, Collier Salina, MD       Chlorhexidine Gluconate Cloth 2 % PADS 6 each  6 each Topical Once Prescott Gum, Collier Salina, MD       DOPamine (INTROPIN) 800 mg in dextrose 5 % 250 mL (3.2 mg/mL) infusion  0-10 mcg/kg/min Intravenous To OR Prescott Gum, Collier Salina, MD       EPINEPHrine (ADRENALIN) 4 mg in dextrose 5 % 250 mL (0.016 mg/mL) infusion  0-10 mcg/min Intravenous To OR Prescott Gum, Collier Salina, MD       heparin 30,000 units/NS 1000 mL solution for CELLSAVER   Other To OR Prescott Gum, Collier Salina, MD       hydrocortisone sodium succinate (SOLU-CORTEF) 100 MG injection            magnesium sulfate (IV Push/IM) injection 40 mEq  40 mEq Other To OR Prescott Gum, Collier Salina, MD       Derrill Memo ON 03/16/2019] metoprolol  tartrate (LOPRESSOR) tablet 12.5 mg  12.5 mg Oral Once Prescott Gum, Collier Salina, MD       milrinone (PRIMACOR) 20 MG/100 ML (0.2 mg/mL) infusion  0.3  mcg/kg/min Intravenous To OR Prescott Gum, Collier Salina, MD       [facility] Hold] norepinephrine (LEVOPHED) 4mg  in 245mL premix infusion  0-40 mcg/min Intravenous STAT Prescott Gum, Collier Salina, MD       phenylephrine (NEOSYNEPHRINE) 20-0.9 MG/250ML-% infusion  30-200 mcg/min Intravenous To OR Prescott Gum, Collier Salina, MD       potassium chloride injection 80 mEq  80 mEq Other To OR Prescott Gum, Collier Salina, MD       Surgifoam 1 Gm with 0.9% sodium chloride (4 ml) topical solution    PRN Prescott Gum, Collier Salina, MD   4 mL at 02/27/2019 2200   temazepam (RESTORIL) capsule 15 mg  15 mg Oral Once PRN Ivin Poot, MD       tranexamic acid (CYKLOKAPRON) bolus via infusion - over 30 minutes 1,197 mg  15 mg/kg Intravenous To OR Prescott Gum, Collier Salina, MD       tranexamic acid (CYKLOKAPRON) pump prime solution 160 mg  2 mg/kg Intracatheter To OR Prescott Gum, Collier Salina, MD       vancomycin (VANCOCIN) 1,250 mg in sodium chloride 0.9 % 250 mL IVPB  1,250 mg Intravenous To OR Ivin Poot, MD       Facility-Administered Medications Ordered in Other Encounters  Medication Dose Route Frequency Provider Last Rate Last Dose   fentaNYL (SUBLIMAZE) injection    Anesthesia Intra-op Valetta Fuller, CRNA   150 mcg at 03/16/2019 2201   lidocaine 2% (20 mg/mL) 5 mL syringe    Anesthesia Intra-op Valetta Fuller, CRNA   80 mg at 03/12/2019 2125   midazolam (VERSED) 5 MG/5ML injection    Anesthesia Intra-op Valetta Fuller, CRNA   2 mg at 02/23/2019 2125   propofol (DIPRIVAN) 10 mg/mL bolus/IV push    Anesthesia Intra-op Valetta Fuller, CRNA   200 mg at 03/10/2019 2125   rocuronium bromide 10 mg/mL (PF) syringe   Intravenous Anesthesia Intra-op Valetta Fuller, CRNA   50 mg at 02/27/2019 2138   succinylcholine (ANECTINE) injection    Anesthesia Intra-op Valetta Fuller, CRNA   120 mg at 02/20/2019 2125   vancomycin  (VANCOCIN) 1,000 mg in sodium chloride 0.9 % 250 mL IVPB   Intravenous Continuous PRN Valetta Fuller, CRNA   1,250 mg at 02/21/2019 2120    Medications Prior to Admission  Medication Sig Dispense Refill Last Dose   aspirin EC 81 MG tablet Take 81 mg by mouth at bedtime.       Cholecalciferol (VITAMIN D3) 2000 UNITS TABS Take 2,000 Units by mouth 2 (two) times a day.       DULoxetine (CYMBALTA) 60 MG capsule Take 60 mg by mouth daily.      furosemide (LASIX) 20 MG tablet Take 20 mg by mouth as needed.      metoprolol tartrate (LOPRESSOR) 25 MG tablet Take 0.5 tablets (12.5 mg total) by mouth 2 (two) times daily. Hold SBP<100, HR <60 90 tablet 1    Multiple Vitamin (MULTI-DAY PO) Take 1 tablet by mouth daily.       pantoprazole (PROTONIX) 40 MG tablet Take 1 tablet (40 mg total) by mouth daily. 30 tablet 1    pravastatin (PRAVACHOL) 40 MG tablet Take 1 tablet (40 mg total) by mouth at bedtime. 90 tablet 1    predniSONE (DELTASONE) 50 MG tablet Take 1 tablet 13 hours, 7 hours, and 1 hour prior to CT test 3 tablet 0    pyridOXINE (VITAMIN B-6) 100 MG tablet Take  100 mg by mouth daily.      tizanidine (ZANAFLEX) 2 MG capsule Take 4 mg by mouth at bedtime.      traMADol (ULTRAM) 50 MG tablet Take 2 tablets by mouth every 6 (six) hours as needed. Not to exceed 6 tablets a day       Family History  Problem Relation Age of Onset   Aortic aneurysm Father        femoral aneurysm   Aortic dissection Father    Mental illness Brother        paranoid schizophrenia   Pneumonia Brother        pneumothorax in [facility]   Heart attack Other        both GF;PGM; M uncle   Stroke Paternal Aunt        aneurysm   Colon cancer Paternal Aunt        X 2   Other Mother        lung tumor which resolved w/o treatment   Hypertension Mother    Other Maternal Grandmother        enalraged heart   Hypertension Maternal Grandmother    Heart attack Maternal Grandfather        In fifties,  died.   Hypertension Maternal Grandfather    Heart attack Paternal Grandmother        In forties.   Hypertension Paternal Grandmother    Heart attack Paternal Grandfather    Heart disease Paternal Grandfather    Hypertension Paternal Grandfather    Hypertension Daughter    Supraventricular tachycardia Daughter    Hypertension Son    AAA (abdominal aortic aneurysm) Son    Diabetes Neg Hx      Review of Systems:   ROS      Cardiac Review of Systems: Y or  [    ]= no  Chest Pain [ y   ]  Resting SOB [   ] Exertional SOB  [  y]  Orthopnea [  ]   Pedal Edema [   ]    Palpitations [  ] Syncope  [  ]   Presyncope [   ]  General Review of Systems: [Y] = yes [  ]=no Constitional: recent weight change [  ]; anorexia [ y ]; fatigue Blue.Reese  ]; nausea Blue.Reese  ]; night sweats [  ]; fever [  ]; or chills [  ]                                                               Dental: Last Dentist visit: 1 year  Eye : blurred vision [  ]; diplopia [   ]; vision changes [  ];  Amaurosis fugax[  ]; Resp: cough [  ];  wheezing[  ];  hemoptysis[  ]; shortness of breath[  ]; paroxysmal nocturnal dyspnea[  ]; dyspnea on exertion[  ]; or orthopnea[  ];  GI:  gallstones[  ], vomiting[  ];  dysphagia[  ]; melena[  ];  hematochezia [  ]; heartburn[  ];   Hx of  Colonoscopy[  ]; GU: kidney stones [  ]; hematuria[  ];   dysuria [  ];  nocturia[  ];  history of     obstruction [  ];  urinary frequency [  ]             Skin: rash, swelling[  ];, hair loss[  ];  peripheral edema[  ];  or itching[  ]; Musculosketetal: myalgias[y ];  joint swelling[  ];  joint erythema[  ];  joint pain[  ];  back pain[ y ];  Heme/Lymph: bruising[  ];  bleeding[  ];  anemia[  ];  Neuro: TIA[  ];  headaches[  ];  stroke[  ];  vertigo[  ];  seizures[  ];   paresthesias[  ];  difficulty walking[  ];  Psych:depression[y  ]; anxiety[ y ];  Endocrine: diabetes[  ];  thyroid dysfunction[  ];                 Physical Exam: BP 103/73     Pulse (!) 109    Temp 98.5 F (36.9 C) (Oral)    Resp 19    Ht 5\' 8"  (1.727 m)    Wt 79.8 kg    SpO2 95%    BMI 26.75 kg/m         Exam    General- alert and comfortable but anxious in preop holding area after being transferred from outside medical facility    Neck- no JVD, no cervical adenopathy palpable, no carotid bruit   Lungs- clear without rales, wheezes   Cor- regular rate and rhythm, no murmur , gallop   Abdomen- soft, non-tender   Extremities - warm, non-tender, minimal edema.  Pulses present in all extremities.   Neuro- oriented, appropriate, no focal weakness   Diagnostic Studies & Laboratory data:     Recent Radiology Findings:   Ct Angio Chest Pe W And/or Wo Contrast  Result Date: 03/14/2019 CLINICAL DATA:  Chest pain, elevated D-dimer EXAM: CT ANGIOGRAPHY CHEST WITH CONTRAST TECHNIQUE: Multidetector CT imaging of the chest was performed using the standard protocol during bolus administration of intravenous contrast. Multiplanar CT image reconstructions and MIPs were obtained to evaluate the vascular anatomy. CONTRAST:  18mL OMNIPAQUE IOHEXOL 350 MG/ML SOLN COMPARISON:  01/17/2017 FINDINGS: Cardiovascular: Satisfactory opacification of the vessels. 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 Nidus of dissection appears to be within the superior, medial aspect of the tubular ascending aorta and proximal arch, with dissection extending about the origin of the innominate artery but not involving or occluding the lumen. No evidence of pulmonary embolism. Normal heart size. LAD coronary artery calcifications. Small pericardial effusion. Mediastinum/Nodes: No enlarged mediastinal, hilar, or axillary lymph nodes. Thyroid gland, trachea, and esophagus demonstrate no significant findings. Lungs/Pleura: Trace left pleural effusion associated atelectasis or consolidation. No pleural  effusion or pneumothorax. Upper Abdomen: No acute abnormality.  Cholelithiasis. Musculoskeletal: No chest wall abnormality. No acute or significant osseous findings. Review of the MIP images confirms the above findings. IMPRESSION: 1. Satisfactory opacification of the vessels. 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 Nidus of dissection appears to be within the superior, medial aspect of the tubular ascending aorta and proximal arch, with dissection extending about the origin of the innominate artery but not involving or occluding the lumen. The aortic root and aortic valve are normal in caliber and not obviously involved by the dissection, however the presence of a pericardial effusion is concerning for hemopericardium. 2. Normal  contour and caliber of the distal arch and descending thoracic aorta. No significant atherosclerosis is appreciated. 3.  Coronary artery disease. 4.  Trace left pleural effusion. These results were called by telephone at the time of interpretation on 02/20/2019 at 5:52 pm to Dr. Billy Fischer , who verbally acknowledged these results. Electronically Signed   By: Eddie Candle M.D.   On: 02/26/2019 17:53   US Abdomen Limited  Result Date: 02/22/2019 CLINICAL DATA:  Right upper quadrant pain EXAM: ULTRASOUND ABDOMEN LIMITED RIGHT UPPER QUADRANT COMPARISON:  09/09/2015 FINDINGS: Gallbladder: Cholelithiasis measuring up to 18 mm. No pericholecystic fluid or gallbladder wall thickening. Negative sonographic Murphy sign. Common bile duct: Diameter: 3.6 mm Liver: No focal lesion identified. Within normal limits in parenchymal echogenicity. Portal vein is patent on color Doppler imaging with normal direction of blood flow towards the liver. Other: Incidentally noted is mild increased right renal cortical echogenicity as can be seen with medical renal disease. IMPRESSION: 1.  Cholelithiasis without sonographic evidence of acute cholecystitis. Electronically Signed   By: Kathreen Devoid   On: 02/21/2019 12:30   Ct Angio Chest/abd/pel For Dissection W And/or Wo Contrast  Result Date: 03/19/2019 CLINICAL DATA:  Aortic dissection EXAM: CT ANGIOGRAPHY CHEST, ABDOMEN AND PELVIS TECHNIQUE: Multidetector CT imaging through the chest, abdomen and pelvis was performed using the standard protocol during bolus administration of intravenous contrast. Multiplanar reconstructed images and MIPs were obtained and reviewed to evaluate the vascular anatomy. CONTRAST:  9mL OMNIPAQUE IOHEXOL 350 MG/ML SOLN COMPARISON:  March 15, 2019 FINDINGS: CTA CHEST FINDINGS Cardiovascular: Again noted is acute intramural hematoma involving the ascending aorta. There is an acute appearing penetrating ulcer involving the anterior ascending aorta. The aortic arch is ectatic. There is no evidence for a dissection flap. There is a small volume hemopericardium. There is acute intramural hematoma extending the length of the ascending aorta and terminating at the level of the mid to distal aortic arch. Coronary artery calcifications are noted. The arch vessels remain grossly patent. No PE was identified. Mediastinum/Nodes: No enlarged mediastinal, hilar, or axillary lymph nodes. Thyroid gland, trachea, and esophagus demonstrate no significant findings. Lungs/Pleura: There is a small left-sided pleural effusion. The lungs are otherwise essentially clear. No focal area of consolidation. Musculoskeletal: No chest wall abnormality. No acute or significant osseous findings. Review of the MIP images confirms the above findings. CTA ABDOMEN AND PELVIS FINDINGS VASCULAR Aorta: Normal caliber aorta without aneurysm, dissection, vasculitis or significant stenosis. Celiac: Patent without evidence of aneurysm, dissection, vasculitis or significant stenosis. SMA: Patent without evidence of aneurysm, dissection, vasculitis or significant  stenosis. There is a replaced right hepatic artery. Renals: Both renal arteries are patent without evidence of aneurysm, dissection, vasculitis, fibromuscular dysplasia or significant stenosis. IMA: Patent without evidence of aneurysm, dissection, vasculitis or significant stenosis. Inflow: Patent without evidence of aneurysm, dissection, vasculitis or significant stenosis. Veins: No obvious venous abnormality within the limitations of this arterial phase study. Review of the MIP images confirms the above findings. NON-VASCULAR Hepatobiliary: The liver is unremarkable. There is cholelithiasis without CT evidence of acute cholecystitis. Pancreas: Unremarkable. No pancreatic ductal dilatation or surrounding inflammatory changes. Spleen: A wedge-shaped hypoattenuating area in the spleen is favored to be secondary to contrast bolus timing. Adrenals/Urinary Tract: Adrenal glands are unremarkable. Kidneys are normal, without renal calculi, focal lesion, or hydronephrosis. Bladder is unremarkable. Stomach/Bowel: Stomach is within normal limits. Appendix appears normal. No evidence of bowel wall thickening, distention, or inflammatory changes. Lymphatic: No significant vascular findings are present. No  enlarged abdominal or pelvic lymph nodes. Reproductive: Status post hysterectomy. No adnexal masses. Other: No abdominal wall hernia or abnormality. No abdominopelvic ascites. Musculoskeletal: No fracture is seen. Review of the MIP images confirms the above findings. IMPRESSION: 1. Overall findings concerning for a penetrating ulcer involving the anterior ascending aorta resulting in acute intramural hematoma. There is no evidence for a dissection flap. 2. Small volume hemopericardium. 3. Trace left-sided pleural effusion. 4. No acute intra-abdominal abnormality. 5. Cholelithiasis without CT evidence of acute cholecystitis. These results were called by telephone at the time of interpretation on 03/11/2019 at 7:44 pm to Dr.  Gareth Morgan , who verbally acknowledged these results. Electronically Signed   By: Constance Holster M.D.   On: 02/21/2019 19:56     I have independently reviewed the above radiologic studies and discussed with the patient   Recent Lab Findings: Lab Results  Component Value Date   WBC 11.0 (H) 02/24/2019   HGB 11.8 (L) 03/12/2019   HCT 36.7 03/19/2019   PLT 414 (H) 03/04/2019   GLUCOSE 118 (H) 02/25/2019   CHOL 189 02/22/2018   TRIG 120.0 02/22/2018   HDL 64.20 02/22/2018   LDLCALC 101 (H) 02/22/2018   ALT 180 (H) 02/25/2019   AST 295 (H) 03/04/2019   NA 132 (L) 03/11/2019   K 3.2 (L) 02/19/2019   CL 95 (L) 03/09/2019   CREATININE 1.03 (H) 03/04/2019   BUN 14 02/23/2019   CO2 23 03/03/2019   TSH 2.77 02/22/2018   INR 1.02 06/15/2012   HGBA1C 5.7 02/22/2018      Assessment / Plan:   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 Hypertension Family history positive for aortic dissection GERD    Patient is being prepared for emergency repair of the penetrating ulcer and replacement of the ascending aorta under hypothermic circulatory arrest.  I discussed the procedure detail with the patient including the expected benefits of improved survival and risks of stroke, bleeding, blood transfusion, postoperative organ failure, postop infection, and death.  She understands that without surgery the risk of major mortality morbidity is several fold higher than the risk of surgery.  She agrees to proceed with surgery.  Also discussed the situation with the patient's husband and the waiting area including the major aspects of the surgery, the expected benefits and the associated risks.  @ME1 @ 02/26/2019 10:02 PM

## 2019-03-15 NOTE — ED Notes (Signed)
Patient's husband brought back to the room to be with patient at this time.

## 2019-03-15 NOTE — ED Provider Notes (Signed)
[place] [facility] EMERGENCY DEPARTMENT Provider Note   CSN: [id number] Arrival date & time: 03/14/2019  1113     History   Chief Complaint Chief Complaint  Patient presents with  . Abdominal Pain    HPI Madeline [name] is a 73 y.o. female.   HPI   73yF with CP. Has been ongoing for weeks. Vague pain that comes and goes. Has had previous evaluations for it recently and admitted. Felt that not cardiac. Was following u p with PCP today and noted to be tachycardic. Also c/o sharper pain in upper abdomen that is a little different than the pain she has been having. No fever or chills. No cough. No unusual leg pain or swelling.   Past Medical History:  Diagnosis Date  . Anxiety   . Arthritis 09-14-11   osteoarthritis, osteopenia  . Cataracts, both eyes 09-13-11   not surgical yet  . Cholelithiasis 05/17/2016  . Chronic neck pain 05/29/2016  . Cystitis 2007   sepsis post bladder biopsy, Dr Jeffie Pollock  . Fibromyalgia 09-14-11   Dr Donney Dice, [facility]  . Fractures 09/14/2011   toes-left foot  . GERD (gastroesophageal reflux disease) 09-14-11   tx. Nexium  . H/O foot surgery 12/19/2011  . Hiatal hernia 05/17/2016  . Hyperglycemia 08/23/2016  . Hyperlipidemia   . Hypertension 09-13-11   tx. meds  . 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 Left ventricular hypertrophy 05/15/2017  . Muscle cramping 10/23/2017  . TMJ click 15/40/0867   right side > left  . 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 Chest pain 03/01/2019  . Shoulder pain, bilateral 08/28/2018  . Muscle cramping 10/23/2017  . Asymmetric septal hypertrophy ([place]) 06/09/2017  . Left ventricular hypertrophy 05/15/2017  . Abnormal ECG 02/18/2017  . Pulmonary nodule 02/17/2017  . SOB (shortness of breath) 02/17/2017  . Syncope 01/31/2017  . Preventative health care 08/23/2016  . Hyperglycemia  08/23/2016  . Aortic atherosclerosis ([place]) 05/29/2016  . Chronic neck pain 05/29/2016  . Gallstone 05/17/2016  . Microscopic hematuria 01/08/2016  . OA (osteoarthritis) of knee 06/25/2012  . 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 Osteopenia 03/05/2008  . Hyperlipidemia 12/21/2007  . Essential hypertension 12/21/2007  . LOW BLOOD PRESSURE 12/21/2007  . Hiatal hernia with gastroesophageal reflux 12/21/2007  . CONSTIPATION, CHRONIC 12/21/2007  . Fibromyalgia 12/21/2007  . Memory loss 12/21/2007    Past Surgical History:  Procedure Laterality Date  . ABDOMINAL HYSTERECTOMY  1977   dysfunctional menses; age 41  . APPENDECTOMY  1963  . COLONOSCOPY  2011   negative  . CYSTOSTOMY W/ BLADDER BIOPSY  04-2006  . FOOT ARTHRODESIS  01/03/12   [facility]-- left hallux MP joint arthrodesis  . HAMMER TOE SURGERY  01/03/12   [facility], Dr Doran Durand  . NASAL SINUS SURGERY  09-14-11   right side " sinus lift"  . SIGMOIDOSCOPY    . sling procedure  2006   a and p repair  . TONSILLECTOMY    . TOTAL KNEE ARTHROPLASTY  09/21/2011/BIL   Procedure: TOTAL KNEE ARTHROPLASTY;  Surgeon: Gearlean Alf;  Location: [facility];  Service: Orthopedics;  Laterality: Left;  . TOTAL KNEE ARTHROPLASTY  06/25/2012   Procedure: TOTAL KNEE ARTHROPLASTY;  Surgeon: Gearlean Alf, MD;  Location: [facility];  Service: Orthopedics;  Laterality: Right;  OB History   No obstetric history on file.      Home Medications    Prior to Admission medications   Medication Sig Start Date End Date Taking? Authorizing Provider  aspirin EC 81 MG tablet Take 81 mg by mouth at bedtime.     [provider]  Cholecalciferol (VITAMIN D3) 2000 UNITS TABS Take 2,000 Units by mouth 2 (two) times a day.     [provider]  DULoxetine (CYMBALTA) 60 MG capsule Take 60 mg by mouth daily.    [provider]  furosemide (LASIX) 20 MG tablet Take 20 mg by mouth as needed.    [provider]  metoprolol tartrate (LOPRESSOR) 25 MG tablet Take 0.5 tablets (12.5 mg total) by mouth 2 (two) times daily. Hold SBP<100, HR <60 03/07/19 03/06/20  Minus Breeding, MD  Multiple Vitamin (MULTI-DAY PO) Take 1 tablet by mouth daily.     [provider]  pantoprazole (PROTONIX) 40 MG tablet Take 1 tablet (40 mg total) by mouth daily. 03/02/19 03/01/20  [place] Shi, MD  pravastatin (PRAVACHOL) 40 MG tablet Take 1 tablet (40 mg total) by mouth at bedtime. 08/28/18   Mosie Lukes, MD  predniSONE (DELTASONE) 50 MG tablet Take 1 tablet 13 hours, 7 hours, and 1 hour prior to CT test 03/13/19   Pixie Casino, MD  pyridOXINE (VITAMIN B-6) 100 MG tablet Take 100 mg by mouth daily.    [provider]  tizanidine (ZANAFLEX) 2 MG capsule Take 4 mg by mouth at bedtime.    [provider]  traMADol (ULTRAM) 50 MG tablet Take 2 tablets by mouth every 6 (six) hours as needed. Not to exceed 6 tablets a day 06/04/15   [provider]    Family History Family History  Problem Relation Age of Onset  . Aortic aneurysm Father        femoral aneurysm  . Aortic dissection Father   . Mental illness Brother        paranoid schizophrenia  . Pneumonia Brother        pneumothorax in [place]  . Heart attack Other        both GF;PGM; M uncle  . Stroke Paternal Aunt        aneurysm  . Colon cancer Paternal Aunt        X 2  . Other Mother        lung tumor which resolved w/o treatment  . Hypertension Mother   . Other Maternal Grandmother        enalraged heart  . Hypertension Maternal Grandmother   . Heart attack Maternal Grandfather        In fifties, died.  . Hypertension Maternal Grandfather   . Heart attack Paternal Grandmother        In forties.  . Hypertension Paternal Grandmother   . Heart attack Paternal Grandfather   . Heart disease Paternal Grandfather   . Hypertension Paternal Grandfather   . Hypertension Daughter   . Supraventricular  tachycardia Daughter   . Hypertension Son   . AAA (abdominal aortic aneurysm) Son   . Diabetes Neg Hx     Social History Social History   Tobacco Use  . Smoking status: Never Smoker  . Smokeless tobacco: Never Used  Substance Use Topics  . Alcohol use: Not Currently  . Drug use: No     Allergies   Ivp dye [iodinated diagnostic agents] and Codeine   Review of Systems  Review of Systems  All systems reviewed and negative, other than as noted in HPI.  Physical Exam Updated Vital Signs BP 138/82   Pulse (!) 108   Temp 98.5 F (36.9 C) (Oral)   Resp 12   Ht 5\' 8"  (1.727 m)   Wt 79.8 kg   SpO2 100%   BMI 26.75 kg/m   Physical Exam Vitals signs and nursing note reviewed.  Constitutional:      General: She is not in acute distress.    Appearance: She is well-developed.  HENT:     Head: Normocephalic and atraumatic.  Eyes:     General:        Right eye: No discharge.        Left eye: No discharge.     Conjunctiva/sclera: Conjunctivae normal.  Neck:     Musculoskeletal: Neck supple.  Cardiovascular:     Rate and Rhythm: Regular rhythm. Tachycardia present.     Heart sounds: Normal heart sounds. No murmur. No friction rub. No gallop.   Pulmonary:     Effort: Pulmonary effort is normal. No respiratory distress.     Breath sounds: Normal breath sounds.  Abdominal:     General: There is no distension.     Palpations: Abdomen is soft.     Tenderness: There is abdominal tenderness.     Comments: Mild epigastric tenderness w/o rebound or guarding  Musculoskeletal:        General: No tenderness.  Skin:    General: Skin is warm and dry.  Neurological:     Mental Status: She is alert.  Psychiatric:        Behavior: Behavior normal.        Thought Content: Thought content normal.      ED Treatments / Results  Labs (all labs ordered are listed, but only abnormal results are displayed) Labs Reviewed  COMPREHENSIVE METABOLIC PANEL - Abnormal; Notable for the  following components:      Result Value   Sodium 132 (*)    Potassium 3.2 (*)    Chloride 95 (*)    Glucose, Bld 118 (*)    Creatinine, Ser 1.03 (*)    Calcium 8.8 (*)    Albumin 3.4 (*)    AST 295 (*)    ALT 180 (*)    GFR calc non Af Amer 54 (*)    All other components within normal limits  D-DIMER, QUANTITATIVE (NOT AT [facility]) - Abnormal; Notable for the following components:   D-Dimer, Quant 7.23 (*)    All other components within normal limits  CBC WITH DIFFERENTIAL/PLATELET - Abnormal; Notable for the following components:   WBC 11.0 (*)    Hemoglobin 11.8 (*)    Platelets 414 (*)    Neutro Abs 8.4 (*)    All other components within normal limits  LIPASE, BLOOD    EKG None  Radiology US Abdomen Limited  Result Date: 03/11/2019 CLINICAL DATA:  Right upper quadrant pain EXAM: ULTRASOUND ABDOMEN LIMITED RIGHT UPPER QUADRANT COMPARISON:  09/09/2015 FINDINGS: Gallbladder: Cholelithiasis measuring up to 18 mm. No pericholecystic fluid or gallbladder wall thickening. Negative sonographic Murphy sign. Common bile duct: Diameter: 3.6 mm Liver: No focal lesion identified. Within normal limits in parenchymal echogenicity. Portal vein is patent on color Doppler imaging with normal direction of blood flow towards the liver. Other: Incidentally noted is mild increased right renal cortical echogenicity as can be seen with medical renal disease. IMPRESSION: 1. Cholelithiasis without sonographic evidence of acute  cholecystitis. Electronically Signed   By: Kathreen Devoid   On: 03/03/2019 12:30    Procedures Procedures (including critical care time)  Medications Ordered in ED Medications  diphenhydrAMINE (BENADRYL) capsule 50 mg (has no administration in time range)    Or  diphenhydrAMINE (BENADRYL) injection 50 mg (has no administration in time range)  hydrocortisone sodium succinate (SOLU-CORTEF) 100 MG injection (has no administration in time range)  sodium chloride 0.9 % bolus 1,000 mL  (1,000 mLs Intravenous New Bag/Given 02/28/2019 1359)  hydrocortisone sodium succinate (SOLU-CORTEF) injection 200 mg (200 mg Intravenous Given 03/02/2019 1401)     Initial Impression / Assessment and Plan / ED Course  I have reviewed the triage vital signs and the nursing notes.  Pertinent labs & imaging results that were available during my care of the patient were reviewed by me and considered in my medical decision making (see chart for details).   73yF with several week hs of vague chest pain. Had extensive cardiac w/u at this point. Still sounds atypical for ACS. Resting tachycardia noted and d-dimer significantly elevated. Will CTa. Also some upper abdominal pain that I think may be from separate process. [place] noting gallstones which I think explains it although could potentially be the source of her pain all along. LFTs mildly elevated but afebril, tbili normal, CBD normal caliber and no sonographic signs of cholecystitis. Will be outpt FU with surgery with regards to this. Care signed out to Dr Billy Fischer with CTa pending.   Final Clinical Impressions(s) / ED Diagnoses   Final diagnoses:  Chest pain, unspecified type  Calculus of gallbladder without cholecystitis without obstruction    ED Discharge Orders    None       Virgel Manifold, MD 03/13/2019 [phone]

## 2019-03-15 NOTE — ED Notes (Signed)
Bedside toilet place in room.

## 2019-03-15 NOTE — ED Notes (Signed)
[facility] notified (Tammy) - patient ready for transport 

## 2019-03-15 NOTE — Anesthesia Preprocedure Evaluation (Signed)
Anesthesia Evaluation  Patient identified by MRN, date of birth, ID band  Reviewed: Allergy & Precautions, H&P , Patient's Chart, lab work & pertinent test resultsPreop documentation limited or incomplete due to emergent nature of procedure.  Airway        Dental   Pulmonary neg pulmonary ROS,    Pulmonary exam normal breath sounds clear to auscultation       Cardiovascular hypertension, Pt. on medications Normal cardiovascular exam Rhythm:Regular Rate:Normal  Echo 03/07/2019  1. The left ventricle has hyperdynamic systolic function, with an ejection fraction of >65%. The cavity size was normal. There is mildly increased left ventricular wall thickness. Left ventricular diastolic Doppler parameters are consistent with impaired relaxation. No evidence of left ventricular regional wall motion abnormalities.  2. The right ventricle has normal systolic function. The cavity was normal. There is no increase in right ventricular wall thickness.  3. The mitral valve was not assessed. Mild calcification of the mitral valve leaflet. No evidence of mitral valve stenosis. No mitral regurgitation.  4. The aortic valve is tricuspid. Aortic valve regurgitation is mild by color flow Doppler. No stenosis of the aortic valve.  5. The aortic root is normal in size and structure.  6. The inferior vena cava was normal in size with <50% respiratory variability. PA systolic pressure 25 mmHg.   Neuro/Psych  Headaches, PSYCHIATRIC DISORDERS Anxiety negative neurological ROS  negative psych ROS   GI/Hepatic Neg liver ROS, hiatal hernia, GERD  Medicated and Controlled,  Endo/Other  negative endocrine ROS  Renal/GU negative Renal ROS     Musculoskeletal  (+) Arthritis , Fibromyalgia -  Abdominal   Peds  Hematology negative hematology ROS (+)   Anesthesia Other Findings Upper front cap  Reproductive/Obstetrics negative OB ROS                              Anesthesia Physical  Anesthesia Plan  ASA: IV and emergent  Anesthesia Plan: General   Post-op Pain Management:    Induction: Intravenous  PONV Risk Score and Plan: 3 and Treatment may vary due to age or medical condition  Airway Management Planned: Oral ETT  Additional Equipment: Arterial line, CVP, PA Cath, TEE and Ultrasound Guidance Line Placement  Intra-op Plan: Utilization Of Total Body Hypothermia per surgeon request and Delibrate Circulatory arrest per surgeon request  Post-operative Plan: Post-operative intubation/ventilation  Informed Consent: I have reviewed the patients History and Physical, chart, labs and discussed the procedure including the risks, benefits and alternatives for the proposed anesthesia with the patient or authorized representative who has indicated his/her understanding and acceptance.     Dental advisory given  Plan Discussed with: CRNA  Anesthesia Plan Comments:         Anesthesia Quick Evaluation                                   Anesthesia Evaluation  Patient identified by MRN, date of birth, ID band Patient awake    Reviewed: Allergy & Precautions, H&P , NPO status , Patient's Chart, lab work & pertinent test results  Airway Mallampati: II TM Distance: >3 FB Neck ROM: full    Dental No notable dental hx. (+) Teeth Intact and Dental Advisory Given,    Pulmonary neg pulmonary ROS,  clear to auscultation  Pulmonary exam normal       Cardiovascular Exercise Tolerance:  Good hypertension, On Medications neg cardio ROS - Valvular Problems/Murmursregular Normal    Neuro/Psych  Headaches, Negative Neurological ROS  Negative Psych ROS   GI/Hepatic negative GI ROS, Neg liver ROS, GERD-  Medicated and Controlled,  Endo/Other  Negative Endocrine ROS  Renal/GU negative Renal ROS  Genitourinary negative   Musculoskeletal  (+) Fibromyalgia -  Abdominal    Peds  Hematology negative hematology ROS (+)   Anesthesia Other Findings   Reproductive/Obstetrics negative OB ROS                         Anesthesia Physical Anesthesia Plan  ASA: II  Anesthesia Plan: General   Post-op Pain Management:    Induction: Intravenous  Airway Management Planned: Oral ETT  Additional Equipment:   Intra-op Plan:   Post-operative Plan: Extubation in OR  Informed Consent: I have reviewed the patients History and Physical, chart, labs and discussed the procedure including the risks, benefits and alternatives for the proposed anesthesia with the patient or authorized representative who has indicated his/her understanding and acceptance.   Dental Advisory Given  Plan Discussed with: CRNA and Surgeon  Anesthesia Plan Comments:        Anesthesia Quick Evaluation

## 2019-03-15 NOTE — Telephone Encounter (Signed)
Copied from [employer] [id number]. Topic: Quick Communication - See Telephone Encounter >> Mar 14, 2019  6:33 PM Loma [place] wrote: CRM for notification. See Telephone encounter for: 03/14/19. Pt called and wants to maybe have a virtual with Dr Charlett Blake. She was in the hospital on 6/12 and 13 and then in the ER on yesterday. She is not getting anywhere with results and is in a lot of pain. She thinks that she has gallstones, has been on a keto diet and lost 29 lbs. The pain is making her completely miserable and she needs an answer if it is even a referral. Pls call her as soon  as possible for possible scheduling. This is after-hours call.

## 2019-03-15 NOTE — Telephone Encounter (Signed)
Needs visit please.  

## 2019-03-15 NOTE — Progress Notes (Signed)
Subjective:    Patient ID: Madeline [name], female    DOB: [birth year], 73 y.o.   MRN: [id number]  HPI  Patient is a 73 yr old female who presents today with c/o severe weakness and abdominal discomfort.   Pt reports that she initially developed substernal chest pain on 6/12.  Reports that she had some mild numbness in the left 4th and 5th fingers at that time as well.  Reports that her pain was so severe that she went to the [facility] ED. She was admitted overnight.  Reports that she had a large soft BM at the hospital on the night of the 12th.   CBC was normal at that time. troponins were negative. She had a neg cxr on 6/12. covid was negative on 03/01/19 Had negative stress on 6/13 with normal LVEF. She returned to the ED on 03/12/19 due to chest pain.  She was noted to have a mild elevation of her WBC. Troponin was negative. Also noted to be mildly anemic. CXR was negative that day.   She reports that in the last 3 days she has been staying in bed due to the weakness. Has some mild pain up under her right ribs. She denies chest pain but has an "unsettled" feeling in her stomach since coming home.  She denies nausea. Tolerating water. Yesterday she ate a bowel of oatmeal, 1/2 banana and a bowl of grits.  No vomiting.   She reports that if she takes a real deep breath it hurts but she denies SOB. Denies dysuria, frequency or blood.     Review of Systems   See HPI  Past Medical History:  Diagnosis Date  . Anxiety   . Arthritis 09-14-11   osteoarthritis, osteopenia  . Cataracts, both eyes 09-13-11   not surgical yet  . Cholelithiasis 05/17/2016  . Chronic neck pain 05/29/2016  . Cystitis 2007   sepsis post bladder biopsy, Dr Jeffie Pollock  . Fibromyalgia 09-14-11   Dr Donney Dice, [facility]  . Fractures 09/14/2011   toes-left foot  . GERD (gastroesophageal reflux disease) 09-14-11   tx. Nexium  . H/O foot surgery 12/19/2011  . Hiatal hernia 05/17/2016  . Hyperglycemia 08/23/2016  .  Hyperlipidemia   . Hypertension 09-13-11   tx. meds  . 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 Left ventricular hypertrophy 05/15/2017  . Muscle cramping 10/23/2017  . TMJ click 56/97/9480   right side > left  . 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 Marital status: Married    Spouse name: Not on file  . Number of children: 2  . Years of education: Not on file  . Highest education level: Not on file  Occupational History  . Not on file  Social Needs  . Financial resource strain: Not on file  . Food insecurity    Worry: Not on file    Inability: Not on file  . Transportation needs    Medical: Not on file    Non-medical: Not on file  Tobacco Use  . Smoking status: Never Smoker  . Smokeless tobacco: Never Used  Substance and Sexual Activity  . Alcohol use: Not Currently  . Drug use: No  . Sexual activity: Not Currently  Lifestyle  . Physical activity    Days per week: Not on file    Minutes per session: Not on  file  . Stress: Not on file  Relationships  . Social Herbalist on phone: Not on file    Gets together: Not on file    Attends religious service: Not on file    Active member of club or organization: Not on file    Attends meetings of clubs or organizations: Not on file    Relationship status: Not on file  . Intimate partner violence    Fear of current or ex partner: Not on file    Emotionally abused: Not on file    Physically abused: Not on file    Forced sexual activity: Not on file  Other Topics Concern  . Not on file  Social History Narrative   Reg exercise 2 x weekly with trainer, no dietary restrictions. Trying to eat heart healthy.       Retired from [employer].    Past Surgical History:  Procedure Laterality Date  . ABDOMINAL HYSTERECTOMY  1977   dysfunctional menses; age 38  . APPENDECTOMY  1963  . COLONOSCOPY  2011    negative  . CYSTOSTOMY W/ BLADDER BIOPSY  04-2006  . FOOT ARTHRODESIS  01/03/12    ortho-- left hallux MP joint arthrodesis  . HAMMER TOE SURGERY  01/03/12   [facility], Dr Doran Durand  . NASAL SINUS SURGERY  09-14-11   right side " sinus lift"  . SIGMOIDOSCOPY    . sling procedure  2006   a and p repair  . TONSILLECTOMY    . TOTAL KNEE ARTHROPLASTY  09/21/2011/BIL   Procedure: TOTAL KNEE ARTHROPLASTY;  Surgeon: Gearlean Alf;  Location: [facility];  Service: Orthopedics;  Laterality: Left;  . TOTAL KNEE ARTHROPLASTY  06/25/2012   Procedure: TOTAL KNEE ARTHROPLASTY;  Surgeon: Gearlean Alf, MD;  Location: [facility];  Service: Orthopedics;  Laterality: Right;    Family History  Problem Relation Age of Onset  . Aortic aneurysm Father        femoral aneurysm  . Aortic dissection Father   . Mental illness Brother        paranoid schizophrenia  . Pneumonia Brother        pneumothorax in [place]  . Heart attack Other        both GF;PGM; M uncle  . Stroke Paternal Aunt        aneurysm  . Colon cancer Paternal Aunt        X 2  . Other Mother        lung tumor which resolved w/o treatment  . Hypertension Mother   . Other Maternal Grandmother        enalraged heart  . Hypertension Maternal Grandmother   . Heart attack Maternal Grandfather        In fifties, died.  . Hypertension Maternal Grandfather   . Heart attack Paternal Grandmother        In forties.  . Hypertension Paternal Grandmother   . Heart attack Paternal Grandfather   . Heart disease Paternal Grandfather   . Hypertension Paternal Grandfather   . Hypertension Daughter   . Supraventricular tachycardia Daughter   . Hypertension Son   . AAA (abdominal aortic aneurysm) Son   . Diabetes Neg Hx     Allergies  Allergen Reactions  . Ivp Dye [Iodinated Diagnostic Agents]     Rash , hives  . Codeine Itching    Unless something given to counteract itching    Current Outpatient Medications on File Prior to Visit  Medication Sig Dispense Refill  . aspirin EC 81 MG tablet Take 81 mg by mouth at bedtime.     . Cholecalciferol (VITAMIN D3) 2000 UNITS TABS Take 2,000 Units by mouth 2 (two) times a day.     . DULoxetine (CYMBALTA) 60 MG capsule Take 60 mg by mouth daily.    . furosemide (LASIX) 20 MG tablet Take 20 mg by mouth as needed.    . metoprolol tartrate (LOPRESSOR) 25 MG tablet Take 0.5 tablets (12.5 mg total) by mouth 2 (two) times daily. Hold SBP<100, HR <60 90 tablet 1  . Multiple Vitamin (MULTI-DAY PO) Take 1 tablet by mouth daily.     . pantoprazole (PROTONIX) 40 MG tablet Take 1 tablet (40 mg total) by mouth daily. 30 tablet 1  . pravastatin (PRAVACHOL) 40 MG tablet Take 1 tablet (40 mg total) by mouth at bedtime. 90 tablet 1  . predniSONE (DELTASONE) 50 MG tablet Take 1 tablet 13 hours, 7 hours, and 1 hour prior to CT test 3 tablet 0  . pyridOXINE (VITAMIN B-6) 100 MG tablet Take 100 mg by mouth daily.    . tizanidine (ZANAFLEX) 2 MG capsule Take 4 mg by mouth at bedtime.    . traMADol (ULTRAM) 50 MG tablet Take 2 tablets by mouth every 6 (six) hours as needed. Not to exceed 6 tablets a day     No current facility-administered medications on file prior to visit.     BP 107/75 (BP Location: Left Arm, Patient Position: Sitting, Cuff Size: Small)   Pulse (!) 118   Temp 98.8 F (37.1 C) (Oral)   Resp 16   Ht 5\' 8"  (1.727 m)   Wt 176 lb (79.8 kg)   SpO2 99%   BMI 26.76 kg/m       Objective:   Physical Exam Constitutional:      Appearance: She is well-developed. She is ill-appearing.     Comments: Weak appearing, seated in wheelchair  Cardiovascular:     Rate and Rhythm: Normal rate and regular rhythm.     Heart sounds: Normal heart sounds. No murmur.  Pulmonary:     Effort: Pulmonary effort is normal. No respiratory distress.     Breath sounds: Normal breath sounds. No wheezing.  Abdominal:     Comments: Mild RUQ tenderness to palpation with slight guarding  Psychiatric:         Behavior: Behavior normal.        Thought Content: Thought content normal.        Judgment: Judgment normal.           Assessment & Plan:  RUQ pain-  I am concerned about the possibility of cholecystitis given her mild leukocytosis and right upper quadrant tenderness.  I do not see that she has had any recent LFTs performed.  She will need additional abdominal imaging and follow-up lab work to include CBC and LFTs.  I am also very concerned about her tachycardia.  Could be related to acute cholecystitis but I believe PE also needs to be ruled out.  I also think that she is mildly dehydrated and could benefit from some IV hydration.  I have advised the patient that I would like her evaluated in the emergency department for further evaluation.  Patient is agreeable.  Report given to Dr. Wilson Singer ED physician at the [place] ED.   25 minutes spent with pt today. >50% of this time was spent counseling patient on her work up and  plan of care.

## 2019-03-15 NOTE — ED Provider Notes (Signed)
  Physical Exam  BP (!) 151/83   Pulse 99   Temp 98.5 F (36.9 C) (Oral)   Resp 18   Ht 5\' 8"  (1.727 m)   Wt 79.8 kg   SpO2 97%   BMI 26.75 kg/m   Physical Exam  ED Course/Procedures     .Critical Care Performed by: Gareth Morgan, MD Authorized by: Gareth Morgan, MD   Critical care provider statement:    Critical care time (minutes):  34   Critical care was time spent personally by me on the following activities:  Discussions with consultants, evaluation of patient's response to treatment, examination of patient, ordering and performing treatments and interventions, ordering and review of laboratory studies, ordering and review of radiographic studies, pulse oximetry, re-evaluation of patient's condition, obtaining history from patient or surrogate and review of old charts    MDM  73 year old female with a history of LVH, septal hypertrophy, hypertension, hyperlipidemia family history of aortic dissection in her fa's, evaluation in the emergency department June 23 and follow-up with cardiology on June 24, who presents at the request of her PCP with concern for rule out pulmonary embolus, as well as possible cholecystitis.  Right upper ultrasound showed cholelithiasis without cholecystitis.  Patient has an allergy to IV dye causing rash, and is currently awaiting pretreatment and CT angios PE study.   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  Patient's blood pressures in the 130s, and heart rate around 100.  Discussed with Dr. Nils Pyle of cardiothoracic surgery.  He recommends CTA dissection study prior to transfer.  CT dissection study shows more likely aortic ulcer with hematoma.  Patient to be transferred to [facility] for evaluation by Dr. Prescott Gum and likely surgery tonight..  Patient with blood pressures of 130s, heart rate in low 100s prior to discussion of findings on CT.  Following findings, noted  to have increased blood pressures which were discussed with CT surgery.  Given Ativan.  Transferred to [facility] for further care.   Gareth Morgan, MD 03/16/19 [phone]

## 2019-03-15 NOTE — ED Notes (Signed)
C/o rt upper abd pain onse this am,  Has occurred 2 other times this month

## 2019-03-15 NOTE — Patient Instructions (Signed)
Please proceed to the ER on the first floor for further evaluation.

## 2019-03-15 NOTE — Anesthesia Procedure Notes (Signed)
Central Venous Catheter Insertion Performed by: Nolon Nations, MD, anesthesiologist Start/End06/14/2020 9:32 PM, 03/12/2019 9:47 PM Patient location: Pre-op. Preanesthetic checklist: patient identified, IV checked, site marked, risks and benefits discussed, surgical consent, monitors and equipment checked, pre-op evaluation, timeout performed and anesthesia consent Position: Trendelenburg Lidocaine 1% used for infiltration and patient sedated Hand hygiene performed  and maximum sterile barriers used  Catheter size: 9 Fr PA cath was placed.MAC introducer Swan type:thermodilution Procedure performed using ultrasound guided technique. Ultrasound Notes:anatomy identified, needle tip was noted to be adjacent to the nerve/plexus identified, no ultrasound evidence of intravascular and/or intraneural injection and image(s) printed for medical record Attempts: 1 Following insertion, line sutured and Biopatch. Post procedure assessment: blood return through all ports, free fluid flow and no air  Patient tolerated the procedure well with no immediate complications.

## 2019-03-15 NOTE — ED Notes (Signed)
Pt to be pre-medicated prior to CTA with IV contrast d/t contrast allergy, will plan to scan patient around 1700 to allow for 4 hour pre-medication window per Radiology Protocol

## 2019-03-15 NOTE — Anesthesia Procedure Notes (Signed)
Central Venous Catheter Insertion Performed by: Nolon Nations, MD, anesthesiologist Start/End06/16/2020 9:47 PM, 03/15/2019 9:53 PM Patient location: Pre-op. Preanesthetic checklist: patient identified, IV checked, site marked, risks and benefits discussed, surgical consent, monitors and equipment checked, pre-op evaluation, timeout performed and anesthesia consent Hand hygiene performed  and maximum sterile barriers used  PA cath was placed.Swan type:thermodilution PA Cath depth:42 Procedure performed without using ultrasound guided technique. Attempts: 1 Patient tolerated the procedure well with no immediate complications.

## 2019-03-15 NOTE — ED Notes (Signed)
Patient transported to CT 

## 2019-03-15 NOTE — ED Triage Notes (Signed)
Right upper quadrant pain while being seen by her MD this am. She was told to come here for further evaluation. Burping a lot per pt.

## 2019-03-15 NOTE — Anesthesia Procedure Notes (Addendum)
Procedure Name: Intubation Date/Time: 03/13/2019 9:28 PM Performed by: Valetta Fuller, CRNA Pre-anesthesia Checklist: Patient identified, Emergency Drugs available, Suction available and Patient being monitored Patient Re-evaluated:Patient Re-evaluated prior to induction Oxygen Delivery Method: Circle System Utilized Preoxygenation: Pre-oxygenation with 100% oxygen Induction Type: IV induction, Rapid sequence and Cricoid Pressure applied Laryngoscope Size: Miller and 2 Grade View: Grade I Tube type: Oral Tube size: 8.0 mm Number of attempts: 1 Airway Equipment and Method: Stylet and Oral airway Placement Confirmation: ETT inserted through vocal cords under direct vision,  positive ETCO2 and breath sounds checked- equal and bilateral Secured at: 23 cm Tube secured with: Tape Dental Injury: Teeth and Oropharynx as per pre-operative assessment

## 2019-03-15 NOTE — ED Notes (Signed)
Contacted Dr. Prescott Gum for CT Surgery consult @ [phone]

## 2019-03-15 NOTE — ED Notes (Addendum)
MD updating family about resilts and plan of care.

## 2019-03-16 ENCOUNTER — Inpatient Hospital Stay (HOSPITAL_COMMUNITY): Payer: Medicare Other

## 2019-03-16 DIAGNOSIS — I314 Cardiac tamponade: Secondary | ICD-10-CM

## 2019-03-16 LAB — POCT I-STAT 4, (NA,K, GLUC, HGB,HCT)
Glucose, Bld: 119 mg/dL — ABNORMAL HIGH (ref 70–99)
Glucose, Bld: 191 mg/dL — ABNORMAL HIGH (ref 70–99)
Glucose, Bld: 133 mg/dL — ABNORMAL HIGH (ref 70–99)
Glucose, Bld: 149 mg/dL — ABNORMAL HIGH (ref 70–99)
Glucose, Bld: 151 mg/dL — ABNORMAL HIGH (ref 70–99)
Glucose, Bld: 194 mg/dL — ABNORMAL HIGH (ref 70–99)
Glucose, Bld: 223 mg/dL — ABNORMAL HIGH (ref 70–99)
Glucose, Bld: 178 mg/dL — ABNORMAL HIGH (ref 70–99)
HCT: 25 % — ABNORMAL LOW (ref 36.0–46.0)
HCT: 26 % — ABNORMAL LOW (ref 36.0–46.0)
HCT: 21 % — ABNORMAL LOW (ref 36.0–46.0)
HCT: 24 % — ABNORMAL LOW (ref 36.0–46.0)
HCT: 26 % — ABNORMAL LOW (ref 36.0–46.0)
HCT: 26 % — ABNORMAL LOW (ref 36.0–46.0)
HCT: 32 % — ABNORMAL LOW (ref 36.0–46.0)
HCT: 51 % — ABNORMAL HIGH (ref 36.0–46.0)
Hemoglobin: 8.5 g/dL — ABNORMAL LOW (ref 12.0–15.0)
Hemoglobin: 8.8 g/dL — ABNORMAL LOW (ref 12.0–15.0)
Hemoglobin: 10.9 g/dL — ABNORMAL LOW (ref 12.0–15.0)
Hemoglobin: 7.1 g/dL — ABNORMAL LOW (ref 12.0–15.0)
Hemoglobin: 8.2 g/dL — ABNORMAL LOW (ref 12.0–15.0)
Hemoglobin: 8.8 g/dL — ABNORMAL LOW (ref 12.0–15.0)
Hemoglobin: 8.8 g/dL — ABNORMAL LOW (ref 12.0–15.0)
Hemoglobin: 17.3 g/dL — ABNORMAL HIGH (ref 12.0–15.0)
Potassium: 3.3 mmol/L — ABNORMAL LOW (ref 3.5–5.1)
Potassium: 3.6 mmol/L (ref 3.5–5.1)
Potassium: 3.5 mmol/L (ref 3.5–5.1)
Potassium: 3.6 mmol/L (ref 3.5–5.1)
Potassium: 4.1 mmol/L (ref 3.5–5.1)
Potassium: 4.2 mmol/L (ref 3.5–5.1)
Potassium: <2 mmol/L — CL (ref 3.5–5.1)
Potassium: 4.1 mmol/L (ref 3.5–5.1)
Sodium: 137 mmol/L (ref 135–145)
Sodium: 146 mmol/L — ABNORMAL HIGH (ref 135–145)
Sodium: 135 mmol/L (ref 135–145)
Sodium: 143 mmol/L (ref 135–145)
Sodium: 144 mmol/L (ref 135–145)
Sodium: 144 mmol/L (ref 135–145)
Sodium: 158 mmol/L — ABNORMAL HIGH (ref 135–145)
Sodium: 148 mmol/L — ABNORMAL HIGH (ref 135–145)

## 2019-03-16 LAB — POCT I-STAT 7, (LYTES, BLD GAS, ICA,H+H)
Acid-Base Excess: 1 mmol/L (ref 0.0–2.0)
Acid-Base Excess: 1 mmol/L (ref 0.0–2.0)
Acid-Base Excess: 4 mmol/L — ABNORMAL HIGH (ref 0.0–2.0)
Acid-Base Excess: 1 mmol/L (ref 0.0–2.0)
Acid-base deficit: 12 mmol/L — ABNORMAL HIGH (ref 0.0–2.0)
Acid-base deficit: 4 mmol/L — ABNORMAL HIGH (ref 0.0–2.0)
Acid-base deficit: 2 mmol/L (ref 0.0–2.0)
Acid-base deficit: 4 mmol/L — ABNORMAL HIGH (ref 0.0–2.0)
Acid-base deficit: 7 mmol/L — ABNORMAL HIGH (ref 0.0–2.0)
Acid-base deficit: 20 mmol/L — ABNORMAL HIGH (ref 0.0–2.0)
Acid-base deficit: 7 mmol/L — ABNORMAL HIGH (ref 0.0–2.0)
Acid-base deficit: 13 mmol/L — ABNORMAL HIGH (ref 0.0–2.0)
Acid-base deficit: 11 mmol/L — ABNORMAL HIGH (ref 0.0–2.0)
Acid-base deficit: 1 mmol/L (ref 0.0–2.0)
Bicarbonate: 22.3 mmol/L (ref 20.0–28.0)
Bicarbonate: 16.5 mmol/L — ABNORMAL LOW (ref 20.0–28.0)
Bicarbonate: 18.8 mmol/L — ABNORMAL LOW (ref 20.0–28.0)
Bicarbonate: 19.9 mmol/L — ABNORMAL LOW (ref 20.0–28.0)
Bicarbonate: 24.6 mmol/L (ref 20.0–28.0)
Bicarbonate: 26.1 mmol/L (ref 20.0–28.0)
Bicarbonate: 26.5 mmol/L (ref 20.0–28.0)
Bicarbonate: 29.2 mmol/L — ABNORMAL HIGH (ref 20.0–28.0)
Bicarbonate: 8.7 mmol/L — ABNORMAL LOW (ref 20.0–28.0)
Bicarbonate: 21.1 mmol/L (ref 20.0–28.0)
Bicarbonate: 14.8 mmol/L — ABNORMAL LOW (ref 20.0–28.0)
Bicarbonate: 14.9 mmol/L — ABNORMAL LOW (ref 20.0–28.0)
Bicarbonate: 24.5 mmol/L (ref 20.0–28.0)
Bicarbonate: 24.8 mmol/L (ref 20.0–28.0)
Calcium, Ion: 0.9 mmol/L — ABNORMAL LOW (ref 1.15–1.40)
Calcium, Ion: 0.72 mmol/L — CL (ref 1.15–1.40)
Calcium, Ion: 0.81 mmol/L — CL (ref 1.15–1.40)
Calcium, Ion: 0.9 mmol/L — ABNORMAL LOW (ref 1.15–1.40)
Calcium, Ion: 0.92 mmol/L — ABNORMAL LOW (ref 1.15–1.40)
Calcium, Ion: 0.96 mmol/L — ABNORMAL LOW (ref 1.15–1.40)
Calcium, Ion: 0.99 mmol/L — ABNORMAL LOW (ref 1.15–1.40)
Calcium, Ion: 1.06 mmol/L — ABNORMAL LOW (ref 1.15–1.40)
Calcium, Ion: 1.13 mmol/L — ABNORMAL LOW (ref 1.15–1.40)
Calcium, Ion: 1.55 mmol/L (ref 1.15–1.40)
Calcium, Ion: 1.34 mmol/L (ref 1.15–1.40)
Calcium, Ion: 1.42 mmol/L — ABNORMAL HIGH (ref 1.15–1.40)
Calcium, Ion: 1.47 mmol/L — ABNORMAL HIGH (ref 1.15–1.40)
Calcium, Ion: 1.4 mmol/L (ref 1.15–1.40)
HCT: 26 % — ABNORMAL LOW (ref 36.0–46.0)
HCT: 20 % — ABNORMAL LOW (ref 36.0–46.0)
HCT: 23 % — ABNORMAL LOW (ref 36.0–46.0)
HCT: 23 % — ABNORMAL LOW (ref 36.0–46.0)
HCT: 23 % — ABNORMAL LOW (ref 36.0–46.0)
HCT: 24 % — ABNORMAL LOW (ref 36.0–46.0)
HCT: 26 % — ABNORMAL LOW (ref 36.0–46.0)
HCT: 31 % — ABNORMAL LOW (ref 36.0–46.0)
HCT: 31 % — ABNORMAL LOW (ref 36.0–46.0)
HCT: 56 % — ABNORMAL HIGH (ref 36.0–46.0)
HCT: 51 % — ABNORMAL HIGH (ref 36.0–46.0)
HCT: 63 % — ABNORMAL HIGH (ref 36.0–46.0)
HCT: 52 % — ABNORMAL HIGH (ref 36.0–46.0)
HCT: 51 % — ABNORMAL HIGH (ref 36.0–46.0)
Hemoglobin: 8.8 g/dL — ABNORMAL LOW (ref 12.0–15.0)
Hemoglobin: 10.5 g/dL — ABNORMAL LOW (ref 12.0–15.0)
Hemoglobin: 10.5 g/dL — ABNORMAL LOW (ref 12.0–15.0)
Hemoglobin: 6.8 g/dL — CL (ref 12.0–15.0)
Hemoglobin: 7.8 g/dL — ABNORMAL LOW (ref 12.0–15.0)
Hemoglobin: 7.8 g/dL — ABNORMAL LOW (ref 12.0–15.0)
Hemoglobin: 7.8 g/dL — ABNORMAL LOW (ref 12.0–15.0)
Hemoglobin: 8.2 g/dL — ABNORMAL LOW (ref 12.0–15.0)
Hemoglobin: 8.8 g/dL — ABNORMAL LOW (ref 12.0–15.0)
Hemoglobin: 19 g/dL — ABNORMAL HIGH (ref 12.0–15.0)
Hemoglobin: 17.3 g/dL — ABNORMAL HIGH (ref 12.0–15.0)
Hemoglobin: 21.4 g/dL (ref 12.0–15.0)
Hemoglobin: 17.7 g/dL — ABNORMAL HIGH (ref 12.0–15.0)
Hemoglobin: 17.3 g/dL — ABNORMAL HIGH (ref 12.0–15.0)
O2 Saturation: 97 %
O2 Saturation: 100 %
O2 Saturation: 100 %
O2 Saturation: 100 %
O2 Saturation: 100 %
O2 Saturation: 100 %
O2 Saturation: 100 %
O2 Saturation: 100 %
O2 Saturation: 96 %
O2 Saturation: 97 %
O2 Saturation: 87 %
O2 Saturation: 94 %
O2 Saturation: 96 %
O2 Saturation: 92 %
Patient temperature: 36.1
Patient temperature: 36
Patient temperature: 37.7
Patient temperature: 37.4
Patient temperature: 37.5
Potassium: 3.4 mmol/L — ABNORMAL LOW (ref 3.5–5.1)
Potassium: 2 mmol/L — CL (ref 3.5–5.1)
Potassium: 2.7 mmol/L — CL (ref 3.5–5.1)
Potassium: 2.8 mmol/L — ABNORMAL LOW (ref 3.5–5.1)
Potassium: 3.5 mmol/L (ref 3.5–5.1)
Potassium: 3.6 mmol/L (ref 3.5–5.1)
Potassium: 4.1 mmol/L (ref 3.5–5.1)
Potassium: 4.2 mmol/L (ref 3.5–5.1)
Potassium: 4.6 mmol/L (ref 3.5–5.1)
Potassium: 3.8 mmol/L (ref 3.5–5.1)
Potassium: 2.6 mmol/L — CL (ref 3.5–5.1)
Potassium: 4.1 mmol/L (ref 3.5–5.1)
Potassium: 3.7 mmol/L (ref 3.5–5.1)
Potassium: 3.9 mmol/L (ref 3.5–5.1)
Sodium: 145 mmol/L (ref 135–145)
Sodium: 139 mmol/L (ref 135–145)
Sodium: 139 mmol/L (ref 135–145)
Sodium: 139 mmol/L (ref 135–145)
Sodium: 141 mmol/L (ref 135–145)
Sodium: 141 mmol/L (ref 135–145)
Sodium: 146 mmol/L — ABNORMAL HIGH (ref 135–145)
Sodium: 150 mmol/L — ABNORMAL HIGH (ref 135–145)
Sodium: 154 mmol/L — ABNORMAL HIGH (ref 135–145)
Sodium: 147 mmol/L — ABNORMAL HIGH (ref 135–145)
Sodium: 148 mmol/L — ABNORMAL HIGH (ref 135–145)
Sodium: 151 mmol/L — ABNORMAL HIGH (ref 135–145)
Sodium: 150 mmol/L — ABNORMAL HIGH (ref 135–145)
Sodium: 149 mmol/L — ABNORMAL HIGH (ref 135–145)
TCO2: 24 mmol/L (ref 22–32)
TCO2: 10 mmol/L — ABNORMAL LOW (ref 22–32)
TCO2: 18 mmol/L — ABNORMAL LOW (ref 22–32)
TCO2: 20 mmol/L — ABNORMAL LOW (ref 22–32)
TCO2: 21 mmol/L — ABNORMAL LOW (ref 22–32)
TCO2: 26 mmol/L (ref 22–32)
TCO2: 27 mmol/L (ref 22–32)
TCO2: 28 mmol/L (ref 22–32)
TCO2: 31 mmol/L (ref 22–32)
TCO2: 23 mmol/L (ref 22–32)
TCO2: 16 mmol/L — ABNORMAL LOW (ref 22–32)
TCO2: 16 mmol/L — ABNORMAL LOW (ref 22–32)
TCO2: 26 mmol/L (ref 22–32)
TCO2: 26 mmol/L (ref 22–32)
pCO2 arterial: 46.1 mmHg (ref 32.0–48.0)
pCO2 arterial: 30.7 mmHg — ABNORMAL LOW (ref 32.0–48.0)
pCO2 arterial: 31.8 mmHg — ABNORMAL LOW (ref 32.0–48.0)
pCO2 arterial: 32.3 mmHg (ref 32.0–48.0)
pCO2 arterial: 34.9 mmHg (ref 32.0–48.0)
pCO2 arterial: 35.5 mmHg (ref 32.0–48.0)
pCO2 arterial: 53.8 mmHg — ABNORMAL HIGH (ref 32.0–48.0)
pCO2 arterial: 62.9 mmHg — ABNORMAL HIGH (ref 32.0–48.0)
pCO2 arterial: 68.9 mmHg (ref 32.0–48.0)
pCO2 arterial: 48.3 mmHg — ABNORMAL HIGH (ref 32.0–48.0)
pCO2 arterial: 32.5 mmHg (ref 32.0–48.0)
pCO2 arterial: 41.1 mmHg (ref 32.0–48.0)
pCO2 arterial: 44.7 mmHg (ref 32.0–48.0)
pCO2 arterial: 35.6 mmHg (ref 32.0–48.0)
pH, Arterial: 7.292 — ABNORMAL LOW (ref 7.350–7.450)
pH, Arterial: 7.04 — CL (ref 7.350–7.450)
pH, Arterial: 7.096 — CL (ref 7.350–7.450)
pH, Arterial: 7.227 — ABNORMAL LOW (ref 7.350–7.450)
pH, Arterial: 7.235 — ABNORMAL LOW (ref 7.350–7.450)
pH, Arterial: 7.335 — ABNORMAL LOW (ref 7.350–7.450)
pH, Arterial: 7.404 (ref 7.350–7.450)
pH, Arterial: 7.449 (ref 7.350–7.450)
pH, Arterial: 7.543 — ABNORMAL HIGH (ref 7.350–7.450)
pH, Arterial: 7.243 — ABNORMAL LOW (ref 7.350–7.450)
pH, Arterial: 7.166 — CL (ref 7.350–7.450)
pH, Arterial: 7.27 — ABNORMAL LOW (ref 7.350–7.450)
pH, Arterial: 7.349 — ABNORMAL LOW (ref 7.350–7.450)
pH, Arterial: 7.453 — ABNORMAL HIGH (ref 7.350–7.450)
pO2, Arterial: 104 mmHg (ref 83.0–108.0)
pO2, Arterial: 120 mmHg — ABNORMAL HIGH (ref 83.0–108.0)
pO2, Arterial: 223 mmHg — ABNORMAL HIGH (ref 83.0–108.0)
pO2, Arterial: 235 mmHg — ABNORMAL HIGH (ref 83.0–108.0)
pO2, Arterial: 248 mmHg — ABNORMAL HIGH (ref 83.0–108.0)
pO2, Arterial: 342 mmHg — ABNORMAL HIGH (ref 83.0–108.0)
pO2, Arterial: 369 mmHg — ABNORMAL HIGH (ref 83.0–108.0)
pO2, Arterial: 380 mmHg — ABNORMAL HIGH (ref 83.0–108.0)
pO2, Arterial: 424 mmHg — ABNORMAL HIGH (ref 83.0–108.0)
pO2, Arterial: 99 mmHg (ref 83.0–108.0)
pO2, Arterial: 67 mmHg — ABNORMAL LOW (ref 83.0–108.0)
pO2, Arterial: 80 mmHg — ABNORMAL LOW (ref 83.0–108.0)
pO2, Arterial: 91 mmHg (ref 83.0–108.0)
pO2, Arterial: 61 mmHg — ABNORMAL LOW (ref 83.0–108.0)

## 2019-03-16 LAB — GLUCOSE, CAPILLARY
Glucose-Capillary: 154 mg/dL — ABNORMAL HIGH (ref 70–99)
Glucose-Capillary: 138 mg/dL — ABNORMAL HIGH (ref 70–99)
Glucose-Capillary: 123 mg/dL — ABNORMAL HIGH (ref 70–99)
Glucose-Capillary: 142 mg/dL — ABNORMAL HIGH (ref 70–99)
Glucose-Capillary: 115 mg/dL — ABNORMAL HIGH (ref 70–99)
Glucose-Capillary: 117 mg/dL — ABNORMAL HIGH (ref 70–99)
Glucose-Capillary: 117 mg/dL — ABNORMAL HIGH (ref 70–99)
Glucose-Capillary: 127 mg/dL — ABNORMAL HIGH (ref 70–99)
Glucose-Capillary: 118 mg/dL — ABNORMAL HIGH (ref 70–99)
Glucose-Capillary: 156 mg/dL — ABNORMAL HIGH (ref 70–99)
Glucose-Capillary: 142 mg/dL — ABNORMAL HIGH (ref 70–99)

## 2019-03-16 LAB — BASIC METABOLIC PANEL
Anion gap: 13 (ref 5–15)
Anion gap: 15 (ref 5–15)
BUN: 10 mg/dL (ref 8–23)
BUN: 13 mg/dL (ref 8–23)
CO2: 20 mmol/L — ABNORMAL LOW (ref 22–32)
CO2: 23 mmol/L (ref 22–32)
Calcium: 10.5 mg/dL — ABNORMAL HIGH (ref 8.9–10.3)
Calcium: 10.3 mg/dL (ref 8.9–10.3)
Chloride: 114 mmol/L — ABNORMAL HIGH (ref 98–111)
Chloride: 113 mmol/L — ABNORMAL HIGH (ref 98–111)
Creatinine, Ser: 0.95 mg/dL (ref 0.44–1.00)
Creatinine, Ser: 1.43 mg/dL — ABNORMAL HIGH (ref 0.44–1.00)
GFR calc Af Amer: >60 mL/min (ref >60)
GFR calc Af Amer: 42 mL/min — ABNORMAL LOW (ref >60)
GFR calc non Af Amer: 59 mL/min — ABNORMAL LOW (ref >60)
GFR calc non Af Amer: 36 mL/min — ABNORMAL LOW (ref >60)
Glucose, Bld: 192 mg/dL — ABNORMAL HIGH (ref 70–99)
Glucose, Bld: 136 mg/dL — ABNORMAL HIGH (ref 70–99)
Potassium: 4.4 mmol/L (ref 3.5–5.1)
Potassium: 3.5 mmol/L (ref 3.5–5.1)
Sodium: 147 mmol/L — ABNORMAL HIGH (ref 135–145)
Sodium: 151 mmol/L — ABNORMAL HIGH (ref 135–145)

## 2019-03-16 LAB — COOXEMETRY PANEL
Carboxyhemoglobin: 0.7 % (ref 0.5–1.5)
Carboxyhemoglobin: 0.7 % (ref 0.5–1.5)
Carboxyhemoglobin: 1.2 % (ref 0.5–1.5)
Methemoglobin: 0.8 % (ref 0.0–1.5)
Methemoglobin: 0.5 % (ref 0.0–1.5)
Methemoglobin: 0.9 % (ref 0.0–1.5)
O2 Saturation: 54.2 %
O2 Saturation: 65 %
O2 Saturation: 64.4 %
Total hemoglobin: 23.1 g/dL (ref 12.0–16.0)
Total hemoglobin: 19.2 g/dL — ABNORMAL HIGH (ref 12.0–16.0)
Total hemoglobin: 19.3 g/dL — ABNORMAL HIGH (ref 12.0–16.0)

## 2019-03-16 LAB — ECHOCARDIOGRAM LIMITED
Height: 68 in
Weight: 2814.83 oz

## 2019-03-16 LAB — CBC
HCT: 57.1 % — ABNORMAL HIGH (ref 36.0–46.0)
HCT: 55.3 % — ABNORMAL HIGH (ref 36.0–46.0)
Hemoglobin: 19.1 g/dL — ABNORMAL HIGH (ref 12.0–15.0)
Hemoglobin: 19.6 g/dL — ABNORMAL HIGH (ref 12.0–15.0)
MCH: 29.3 pg (ref 26.0–34.0)
MCH: 29.8 pg (ref 26.0–34.0)
MCHC: 33.5 g/dL (ref 30.0–36.0)
MCHC: 35.4 g/dL (ref 30.0–36.0)
MCV: 87.6 fL (ref 80.0–100.0)
MCV: 84 fL (ref 80.0–100.0)
Platelets: 55 10*3/uL — ABNORMAL LOW (ref 150–400)
Platelets: 61 10*3/uL — ABNORMAL LOW (ref 150–400)
RBC: 6.52 MIL/uL — ABNORMAL HIGH (ref 3.87–5.11)
RBC: 6.58 MIL/uL — ABNORMAL HIGH (ref 3.87–5.11)
RDW: 13.6 % (ref 11.5–15.5)
RDW: 14.6 % (ref 11.5–15.5)
WBC: 16.8 10*3/uL — ABNORMAL HIGH (ref 4.0–10.5)
WBC: 19.1 10*3/uL — ABNORMAL HIGH (ref 4.0–10.5)
nRBC: 0.3 % — ABNORMAL HIGH (ref 0.0–0.2)
nRBC: 0 % (ref 0.0–0.2)

## 2019-03-16 LAB — BPAM FFP: BLOOD PRODUCT EXPIRATION DATE: 202007022359

## 2019-03-16 LAB — POCT I-STAT, CHEM 8
BUN: 15 mg/dL (ref 8–23)
Calcium, Ion: 1.48 mmol/L — ABNORMAL HIGH (ref 1.15–1.40)
Chloride: 110 mmol/L (ref 98–111)
Creatinine, Ser: 1.1 mg/dL — ABNORMAL HIGH (ref 0.44–1.00)
Glucose, Bld: 132 mg/dL — ABNORMAL HIGH (ref 70–99)
HCT: 52 % — ABNORMAL HIGH (ref 36.0–46.0)
Hemoglobin: 17.7 g/dL — ABNORMAL HIGH (ref 12.0–15.0)
Potassium: 3.4 mmol/L — ABNORMAL LOW (ref 3.5–5.1)
Sodium: 149 mmol/L — ABNORMAL HIGH (ref 135–145)
TCO2: 24 mmol/L (ref 22–32)

## 2019-03-16 LAB — PROTIME-INR
INR: 1.4 — ABNORMAL HIGH (ref 0.8–1.2)
Prothrombin Time: 17.3 seconds — ABNORMAL HIGH (ref 11.4–15.2)

## 2019-03-16 LAB — PREPARE PLATELET PHERESIS: Unit division: 0

## 2019-03-16 LAB — BPAM PLATELET PHERESIS
Blood Product Expiration Date: 202006280929
ISSUE DATE / TIME: 202006270937
Unit Type and Rh: 6200

## 2019-03-16 LAB — PREPARE RBC (CROSSMATCH)

## 2019-03-16 LAB — FIBRINOGEN: Fibrinogen: 369 mg/dL (ref 210–475)

## 2019-03-16 LAB — APTT: aPTT: 64 seconds — ABNORMAL HIGH (ref 24–36)

## 2019-03-16 LAB — HEMOGLOBIN AND HEMATOCRIT, BLOOD
HCT: 25.7 % — ABNORMAL LOW (ref 36.0–46.0)
Hemoglobin: 8.4 g/dL — ABNORMAL LOW (ref 12.0–15.0)

## 2019-03-16 LAB — MAGNESIUM
Magnesium: 1.8 mg/dL (ref 1.7–2.4)
Magnesium: 3.4 mg/dL — ABNORMAL HIGH (ref 1.7–2.4)

## 2019-03-16 LAB — PLATELET COUNT: Platelets: 55 10*3/uL — ABNORMAL LOW (ref 150–400)

## 2019-03-16 MED ORDER — METOPROLOL TARTRATE 12.5 MG HALF TABLET: 12.5000 mg | ORAL_TABLET | Freq: Two times a day (BID) | ORAL | Status: DC

## 2019-03-16 MED ORDER — EPINEPHRINE PF 1 MG/ML IJ SOLN
INTRAMUSCULAR | Status: AC
  Filled 2019-03-16: qty 1

## 2019-03-16 MED ORDER — ALBUMIN HUMAN 5 % IV SOLN
INTRAVENOUS | Status: DC | PRN
  Administered 2019-03-16 (×4): via INTRAVENOUS

## 2019-03-16 MED ORDER — SODIUM BICARBONATE 8.4 % IV SOLN
INTRAVENOUS | Status: AC
  Filled 2019-03-16: qty 50

## 2019-03-16 MED ORDER — PANTOPRAZOLE SODIUM 40 MG PO TBEC: 40.0000 mg | DELAYED_RELEASE_TABLET | Freq: Every day | ORAL | Status: DC

## 2019-03-16 MED ORDER — ROCURONIUM BROMIDE 10 MG/ML (PF) SYRINGE
PREFILLED_SYRINGE | INTRAVENOUS | Status: AC
  Filled 2019-03-16: qty 30

## 2019-03-16 MED ORDER — LACTATED RINGERS IV SOLN
INTRAVENOUS | Status: DC
  Administered 2019-03-20: 06:00:00 via INTRAVENOUS

## 2019-03-16 MED ORDER — SODIUM BICARBONATE 8.4 % IV SOLN
25.0000 meq | Freq: Once | INTRAVENOUS | Status: AC
  Administered 2019-03-16: 12:00:00 25 meq via INTRAVENOUS

## 2019-03-16 MED ORDER — CALCIUM CHLORIDE 10 % IV SOLN
INTRAVENOUS | Status: AC
  Filled 2019-03-16: qty 30

## 2019-03-16 MED ORDER — PROTAMINE SULFATE 10 MG/ML IV SOLN
INTRAVENOUS | Status: AC
  Filled 2019-03-16: qty 5

## 2019-03-16 MED ORDER — ROCURONIUM BROMIDE 10 MG/ML (PF) SYRINGE
PREFILLED_SYRINGE | INTRAVENOUS | Status: AC
  Filled 2019-03-16: qty 10

## 2019-03-16 MED ORDER — SODIUM BICARBONATE 8.4 % IV SOLN
INTRAVENOUS | Status: DC | PRN
  Administered 2019-03-16: 100 meq via INTRAVENOUS
  Administered 2019-03-16: 50 meq via INTRAVENOUS

## 2019-03-16 MED ORDER — BISACODYL 5 MG PO TBEC
10.0000 mg | DELAYED_RELEASE_TABLET | Freq: Every day | ORAL | Status: DC
  Administered 2019-03-19: 10 mg via ORAL
  Filled 2019-03-16: qty 2

## 2019-03-16 MED ORDER — COAGULATION FACTOR VIIA RECOMB 1 MG IV SOLR
90.0000 ug/kg | Freq: Once | INTRAVENOUS | Status: AC
  Administered 2019-03-16: 7000 ug via INTRAVENOUS
  Filled 2019-03-16: qty 5

## 2019-03-16 MED ORDER — METOPROLOL TARTRATE 25 MG/10 ML ORAL SUSPENSION: 12.5000 mg | Freq: Two times a day (BID) | ORAL | Status: DC

## 2019-03-16 MED ORDER — LACTATED RINGERS IV SOLN: 500.0000 mL | Freq: Once | INTRAVENOUS | Status: DC | PRN

## 2019-03-16 MED ORDER — DOCUSATE SODIUM 100 MG PO CAPS: 200.0000 mg | ORAL_CAPSULE | Freq: Every day | ORAL | Status: DC

## 2019-03-16 MED ORDER — CHLORHEXIDINE GLUCONATE CLOTH 2 % EX PADS
6.0000 | MEDICATED_PAD | Freq: Every day | CUTANEOUS | Status: DC
  Administered 2019-03-16 – 2019-03-22 (×6): 6 via TOPICAL

## 2019-03-16 MED ORDER — SODIUM CHLORIDE 0.9% FLUSH
3.0000 mL | Freq: Two times a day (BID) | INTRAVENOUS | Status: DC
  Administered 2019-03-17 – 2019-03-22 (×8): 3 mL via INTRAVENOUS

## 2019-03-16 MED ORDER — VASOPRESSIN 20 UNIT/ML IV SOLN
INTRAVENOUS | Status: DC | PRN
  Administered 2019-03-16: .03 [IU]/min via INTRAVENOUS

## 2019-03-16 MED ORDER — INSULIN REGULAR BOLUS VIA INFUSION
0.0000 [IU] | Freq: Three times a day (TID) | INTRAVENOUS | Status: DC
  Filled 2019-03-16: qty 10

## 2019-03-16 MED ORDER — SODIUM CHLORIDE 0.9 % IV SOLN
1.5000 g | Freq: Two times a day (BID) | INTRAVENOUS | Status: DC
  Administered 2019-03-16 – 2019-03-17 (×2): 1.5 g via INTRAVENOUS
  Filled 2019-03-16 (×3): qty 1.5

## 2019-03-16 MED ORDER — FUROSEMIDE 10 MG/ML IJ SOLN
80.0000 mg | Freq: Once | INTRAMUSCULAR | Status: AC
  Administered 2019-03-16: 80 mg via INTRAVENOUS

## 2019-03-16 MED ORDER — ALBUMIN HUMAN 5 % IV SOLN
250.0000 mL | INTRAVENOUS | Status: AC | PRN
  Administered 2019-03-16: 12.5 g via INTRAVENOUS

## 2019-03-16 MED ORDER — MIDAZOLAM HCL 2 MG/2ML IJ SOLN
INTRAMUSCULAR | Status: AC
  Filled 2019-03-16: qty 2

## 2019-03-16 MED ORDER — COAGULATION FACTOR VIIA RECOMB 1 MG IV SOLR
45.0000 ug/kg | INTRAVENOUS | Status: AC
  Administered 2019-03-16: 4000 ug via INTRAVENOUS
  Filled 2019-03-16: qty 4

## 2019-03-16 MED ORDER — BISACODYL 10 MG RE SUPP
10.0000 mg | Freq: Every day | RECTAL | Status: DC
  Administered 2019-03-17 – 2019-03-22 (×4): 10 mg via RECTAL
  Filled 2019-03-16 (×4): qty 1

## 2019-03-16 MED ORDER — OXYCODONE HCL 5 MG PO TABS: 5.0000 mg | ORAL_TABLET | ORAL | Status: DC | PRN

## 2019-03-16 MED ORDER — EPINEPHRINE 1 MG/10ML IJ SOSY
PREFILLED_SYRINGE | INTRAMUSCULAR | Status: AC
  Filled 2019-03-16: qty 20

## 2019-03-16 MED ORDER — DOPAMINE-DEXTROSE 3.2-5 MG/ML-% IV SOLN
3.0000 ug/kg/min | INTRAVENOUS | Status: DC
  Administered 2019-03-16: 3 ug/kg/min via INTRAVENOUS

## 2019-03-16 MED ORDER — PHENYLEPHRINE HCL (PRESSORS) 10 MG/ML IV SOLN
INTRAVENOUS | Status: AC
  Filled 2019-03-16: qty 1

## 2019-03-16 MED ORDER — POTASSIUM CHLORIDE 10 MEQ/50ML IV SOLN: 10.0000 meq | INTRAVENOUS | Status: AC

## 2019-03-16 MED ORDER — ASPIRIN 81 MG PO CHEW
324.0000 mg | CHEWABLE_TABLET | Freq: Every day | ORAL | Status: DC
  Administered 2019-03-17 – 2019-03-19 (×3): 324 mg
  Filled 2019-03-16 (×3): qty 4

## 2019-03-16 MED ORDER — EPINEPHRINE PF 1 MG/ML IJ SOLN
3.0000 ug/min | INTRAVENOUS | Status: DC
  Administered 2019-03-17: 4 ug/min via INTRAVENOUS
  Administered 2019-03-18 – 2019-03-22 (×6): 3 ug/min via INTRAVENOUS
  Filled 2019-03-16 (×7): qty 4

## 2019-03-16 MED ORDER — FUROSEMIDE 10 MG/ML IJ SOLN
INTRAMUSCULAR | Status: AC
  Filled 2019-03-16: qty 8

## 2019-03-16 MED ORDER — PROTAMINE SULFATE 10 MG/ML IV SOLN
INTRAVENOUS | Status: DC | PRN
  Administered 2019-03-16: 50 mg via INTRAVENOUS
  Administered 2019-03-16: 30 mg via INTRAVENOUS
  Administered 2019-03-16: 20 mg via INTRAVENOUS
  Administered 2019-03-16: 50 mg via INTRAVENOUS
  Administered 2019-03-16: 20 mg via INTRAVENOUS
  Administered 2019-03-16 (×2): 30 mg via INTRAVENOUS
  Administered 2019-03-16: 20 mg via INTRAVENOUS

## 2019-03-16 MED ORDER — PHENYLEPHRINE HCL-NACL 20-0.9 MG/250ML-% IV SOLN: 0.0000 ug/min | INTRAVENOUS | Status: DC

## 2019-03-16 MED ORDER — ACETAMINOPHEN 160 MG/5ML PO SOLN
1000.0000 mg | Freq: Four times a day (QID) | ORAL | Status: AC
  Administered 2019-03-17 – 2019-03-21 (×18): 1000 mg
  Filled 2019-03-16 (×18): qty 40.6

## 2019-03-16 MED ORDER — ACETAMINOPHEN 500 MG PO TABS: 1000.0000 mg | ORAL_TABLET | Freq: Four times a day (QID) | ORAL | Status: DC

## 2019-03-16 MED ORDER — SODIUM BICARBONATE 8.4 % IV SOLN
50.0000 meq | Freq: Once | INTRAVENOUS | Status: AC
  Administered 2019-03-16: 16:00:00 50 meq via INTRAVENOUS

## 2019-03-16 MED ORDER — SODIUM CHLORIDE 0.9 % IV SOLN
10.0000 mL/h | Freq: Once | INTRAVENOUS | Status: AC
  Administered 2019-03-16: 10 mL/h via INTRAVENOUS

## 2019-03-16 MED ORDER — SUCCINYLCHOLINE CHLORIDE 200 MG/10ML IV SOSY
PREFILLED_SYRINGE | INTRAVENOUS | Status: AC
  Filled 2019-03-16: qty 20

## 2019-03-16 MED ORDER — SODIUM CHLORIDE 0.9 % IV SOLN
INTRAVENOUS | Status: DC
  Administered 2019-03-16 – 2019-03-22 (×5): via INTRAVENOUS

## 2019-03-16 MED ORDER — SODIUM BICARBONATE 8.4 % IV SOLN
INTRAVENOUS | Status: AC
  Administered 2019-03-16: 50 meq
  Filled 2019-03-16: qty 100

## 2019-03-16 MED ORDER — MAGNESIUM SULFATE 2 GM/50ML IV SOLN
INTRAVENOUS | Status: AC
  Filled 2019-03-16: qty 50

## 2019-03-16 MED ORDER — SODIUM CHLORIDE 0.9% FLUSH: 3.0000 mL | INTRAVENOUS | Status: DC | PRN

## 2019-03-16 MED ORDER — CHLORHEXIDINE GLUCONATE 0.12 % MT SOLN
15.0000 mL | OROMUCOSAL | Status: AC
  Administered 2019-03-16: 13:00:00 15 mL via OROMUCOSAL

## 2019-03-16 MED ORDER — ESMOLOL HCL 100 MG/10ML IV SOLN
INTRAVENOUS | Status: AC
  Filled 2019-03-16: qty 10

## 2019-03-16 MED ORDER — HYDROCORTISONE NA SUCCINATE PF 100 MG IJ SOLR
INTRAMUSCULAR | Status: DC | PRN
  Administered 2019-03-16: 125 mg via INTRAVENOUS

## 2019-03-16 MED ORDER — NOREPINEPHRINE BITARTRATE 1 MG/ML IV SOLN
INTRAVENOUS | Status: DC | PRN
  Administered 2019-03-16: 2 ug/min via INTRAVENOUS

## 2019-03-16 MED ORDER — VASOPRESSIN 20 UNIT/ML IV SOLN
INTRAVENOUS | Status: AC
  Filled 2019-03-16: qty 2

## 2019-03-16 MED ORDER — ACETAMINOPHEN 160 MG/5ML PO SOLN
650.0000 mg | Freq: Once | ORAL | Status: AC
  Administered 2019-03-16: 650 mg
  Filled 2019-03-16: qty 20.3

## 2019-03-16 MED ORDER — SODIUM CHLORIDE 0.9 % IV SOLN: 250.0000 mL | INTRAVENOUS | Status: DC

## 2019-03-16 MED ORDER — MORPHINE SULFATE (PF) 2 MG/ML IV SOLN
1.0000 mg | INTRAVENOUS | Status: DC | PRN
  Administered 2019-03-17: 2 mg via INTRAVENOUS
  Administered 2019-03-17 (×2): 1 mg via INTRAVENOUS
  Administered 2019-03-18 (×2): 2 mg via INTRAVENOUS
  Administered 2019-03-18 (×2): 1 mg via INTRAVENOUS
  Administered 2019-03-18 (×2): 2 mg via INTRAVENOUS
  Filled 2019-03-16 (×9): qty 1

## 2019-03-16 MED ORDER — COAGULATION FACTOR VIIA RECOMB 1 MG IV SOLR: 45.0000 ug/kg | INTRAVENOUS | Status: DC

## 2019-03-16 MED ORDER — ETOMIDATE 2 MG/ML IV SOLN
INTRAVENOUS | Status: AC
  Filled 2019-03-16: qty 10

## 2019-03-16 MED ORDER — INSULIN REGULAR(HUMAN) IN NACL 100-0.9 UT/100ML-% IV SOLN
INTRAVENOUS | Status: DC
  Administered 2019-03-16: 23:00:00 4 [IU]/h via INTRAVENOUS
  Filled 2019-03-16: qty 100

## 2019-03-16 MED ORDER — POTASSIUM CHLORIDE 10 MEQ/50ML IV SOLN
10.0000 meq | INTRAVENOUS | Status: DC
  Administered 2019-03-16: 10 meq via INTRAVENOUS
  Filled 2019-03-16 (×4): qty 50

## 2019-03-16 MED ORDER — VASOPRESSIN 20 UNIT/ML IV SOLN
0.0300 [IU]/min | INTRAVENOUS | Status: DC
  Filled 2019-03-16: qty 2

## 2019-03-16 MED ORDER — TRANEXAMIC ACID 1000 MG/10ML IV SOLN
1.5000 mg/kg/h | INTRAVENOUS | Status: DC
  Filled 2019-03-16: qty 25

## 2019-03-16 MED ORDER — DEXMEDETOMIDINE HCL IN NACL 200 MCG/50ML IV SOLN
INTRAVENOUS | Status: AC
  Filled 2019-03-16: qty 50

## 2019-03-16 MED ORDER — NITROGLYCERIN IN D5W 200-5 MCG/ML-% IV SOLN: 0.0000 ug/min | INTRAVENOUS | Status: DC

## 2019-03-16 MED ORDER — CHLORHEXIDINE GLUCONATE 0.12% ORAL RINSE (MEDLINE KIT)
15.0000 mL | Freq: Two times a day (BID) | OROMUCOSAL | Status: DC
  Administered 2019-03-16 – 2019-03-22 (×13): 15 mL via OROMUCOSAL

## 2019-03-16 MED ORDER — SODIUM BICARBONATE 8.4 % IV SOLN
100.0000 meq | Freq: Once | INTRAVENOUS | Status: AC
  Administered 2019-03-16: 14:00:00 100 meq via INTRAVENOUS

## 2019-03-16 MED ORDER — ORAL CARE MOUTH RINSE
15.0000 mL | OROMUCOSAL | Status: DC
  Administered 2019-03-16 – 2019-03-23 (×63): 15 mL via OROMUCOSAL

## 2019-03-16 MED ORDER — TRAMADOL HCL 50 MG PO TABS: 50.0000 mg | ORAL_TABLET | ORAL | Status: DC | PRN

## 2019-03-16 MED ORDER — SODIUM CHLORIDE 0.45 % IV SOLN: INTRAVENOUS | Status: DC | PRN

## 2019-03-16 MED ORDER — SODIUM CHLORIDE 0.9 % IV SOLN
1.0000 g | Freq: Once | INTRAVENOUS | Status: DC
  Filled 2019-03-16: qty 10

## 2019-03-16 MED ORDER — MIDAZOLAM HCL 2 MG/2ML IJ SOLN
2.0000 mg | INTRAMUSCULAR | Status: DC | PRN
  Administered 2019-03-17: 1 mg via INTRAVENOUS
  Filled 2019-03-16: qty 2

## 2019-03-16 MED ORDER — SODIUM CHLORIDE (PF) 0.9 % IJ SOLN
INTRAMUSCULAR | Status: AC
  Filled 2019-03-16: qty 20

## 2019-03-16 MED ORDER — DEXMEDETOMIDINE HCL IN NACL 200 MCG/50ML IV SOLN
0.0000 ug/kg/h | INTRAVENOUS | Status: DC
  Administered 2019-03-16 – 2019-03-17 (×7): 0.6 ug/kg/h via INTRAVENOUS
  Filled 2019-03-16 (×7): qty 50

## 2019-03-16 MED ORDER — ACETAMINOPHEN 650 MG RE SUPP: 650.0000 mg | Freq: Once | RECTAL | Status: AC

## 2019-03-16 MED ORDER — MAGNESIUM SULFATE 4 GM/100ML IV SOLN
4.0000 g | Freq: Once | INTRAVENOUS | Status: AC
  Administered 2019-03-16: 4 g via INTRAVENOUS
  Filled 2019-03-16: qty 100

## 2019-03-16 MED ORDER — EPINEPHRINE 1 MG/10ML IJ SOSY
PREFILLED_SYRINGE | INTRAMUSCULAR | Status: DC | PRN
  Administered 2019-03-16 (×2): 1 via INTRAVENOUS

## 2019-03-16 MED ORDER — CALCIUM CHLORIDE 10 % IV SOLN
INTRAVENOUS | Status: DC | PRN
  Administered 2019-03-16 (×4): 200 mg via INTRAVENOUS
  Administered 2019-03-16: 250 mg via INTRAVENOUS
  Administered 2019-03-16 (×2): 200 mg via INTRAVENOUS
  Administered 2019-03-16: 400 mg via INTRAVENOUS
  Administered 2019-03-16 (×4): 200 mg via INTRAVENOUS
  Administered 2019-03-16: 250 mg via INTRAVENOUS
  Administered 2019-03-16 (×2): 200 mg via INTRAVENOUS
  Administered 2019-03-16: 500 mg via INTRAVENOUS
  Administered 2019-03-16: 200 mg via INTRAVENOUS
  Administered 2019-03-16: 250 mg via INTRAVENOUS
  Administered 2019-03-16: 400 mg via INTRAVENOUS
  Administered 2019-03-16: 250 mg via INTRAVENOUS
  Administered 2019-03-16 (×2): 400 mg via INTRAVENOUS
  Administered 2019-03-16: 200 mg via INTRAVENOUS
  Administered 2019-03-16: 500 mg via INTRAVENOUS
  Administered 2019-03-16: 400 mg via INTRAVENOUS
  Administered 2019-03-16: 200 mg via INTRAVENOUS

## 2019-03-16 MED ORDER — LACTATED RINGERS IV SOLN
INTRAVENOUS | Status: DC
  Administered 2019-03-19: 08:00:00 via INTRAVENOUS

## 2019-03-16 MED ORDER — VASOPRESSIN 20 UNIT/ML IV SOLN
INTRAVENOUS | Status: DC | PRN
  Administered 2019-03-16: 20 [IU] via INTRAVENOUS

## 2019-03-16 MED ORDER — FAMOTIDINE IN NACL 20-0.9 MG/50ML-% IV SOLN
20.0000 mg | Freq: Two times a day (BID) | INTRAVENOUS | Status: AC
  Administered 2019-03-16 (×2): 20 mg via INTRAVENOUS
  Filled 2019-03-16: qty 50

## 2019-03-16 MED ORDER — POTASSIUM CHLORIDE 10 MEQ/50ML IV SOLN
10.0000 meq | INTRAVENOUS | Status: AC
  Administered 2019-03-16 (×3): 10 meq via INTRAVENOUS

## 2019-03-16 MED ORDER — ONDANSETRON HCL 4 MG/2ML IJ SOLN: 4.0000 mg | Freq: Four times a day (QID) | INTRAMUSCULAR | Status: DC | PRN

## 2019-03-16 MED ORDER — VANCOMYCIN HCL IN DEXTROSE 1-5 GM/200ML-% IV SOLN
1000.0000 mg | Freq: Once | INTRAVENOUS | Status: AC
  Administered 2019-03-16: 1000 mg via INTRAVENOUS
  Filled 2019-03-16: qty 200

## 2019-03-16 MED ORDER — METOPROLOL TARTRATE 5 MG/5ML IV SOLN
2.5000 mg | INTRAVENOUS | Status: DC | PRN
  Administered 2019-03-18 – 2019-03-19 (×6): 2.5 mg via INTRAVENOUS
  Filled 2019-03-16 (×4): qty 5

## 2019-03-16 MED ORDER — MILRINONE LACTATE IN DEXTROSE 20-5 MG/100ML-% IV SOLN
0.3000 ug/kg/min | INTRAVENOUS | Status: DC
  Administered 2019-03-16: 0.3 ug/kg/min via INTRAVENOUS
  Filled 2019-03-16: qty 100

## 2019-03-16 MED ORDER — ARTIFICIAL TEARS OPHTHALMIC OINT
TOPICAL_OINTMENT | OPHTHALMIC | Status: AC
  Filled 2019-03-16: qty 17.5

## 2019-03-16 MED ORDER — PROTAMINE SULFATE 10 MG/ML IV SOLN
INTRAVENOUS | Status: AC
  Filled 2019-03-16: qty 25

## 2019-03-16 MED ORDER — SODIUM BICARBONATE 4.2 % IV SOLN
25.0000 meq | Freq: Once | INTRAVENOUS | Status: DC
  Filled 2019-03-16: qty 50

## 2019-03-16 MED ORDER — EPINEPHRINE PF 1 MG/ML IJ SOLN
INTRAVENOUS | Status: DC | PRN
  Administered 2019-03-16: 2 ug/min via INTRAVENOUS

## 2019-03-16 MED ORDER — ASPIRIN EC 325 MG PO TBEC: 325.0000 mg | DELAYED_RELEASE_TABLET | Freq: Every day | ORAL | Status: DC

## 2019-03-16 MED ORDER — CALCIUM CHLORIDE 10 % IV SOLN
1.0000 g | Freq: Once | INTRAVENOUS | Status: AC
  Administered 2019-03-16: 15:00:00 1 g via INTRAVENOUS

## 2019-03-16 MED ORDER — NOREPINEPHRINE 4 MG/250ML-% IV SOLN
0.0000 ug/min | INTRAVENOUS | Status: DC
  Administered 2019-03-17: 5 ug/min via INTRAVENOUS
  Administered 2019-03-17: 8 ug/min via INTRAVENOUS
  Filled 2019-03-16 (×2): qty 250

## 2019-03-16 MED ORDER — SODIUM CHLORIDE 0.9 % IV SOLN
INTRAVENOUS | Status: DC | PRN
  Administered 2019-03-16: 07:00:00 via INTRAVENOUS

## 2019-03-16 NOTE — Transfer of Care (Signed)
Immediate Anesthesia Transfer of Care Note  Patient: Madeline [name]  Procedure(s) Performed: REPLACEMENT OF ASCENDING AORTA USING HEMASHIELD PLATINUM 28MM and 30MM VASCULAR GRAFT (N/A Chest)  Patient Location: SICU  Anesthesia Type:General  Level of Consciousness: sedated and Patient remains intubated per anesthesia plan  Airway & Oxygen Therapy: Patient remains intubated per anesthesia plan and Patient placed on Ventilator (see vital sign flow sheet for setting)  Post-op Assessment: Report given to RN and Post -op Vital signs reviewed and stable  Post vital signs: Reviewed and stable  Last Vitals:  Vitals Value Taken Time  BP 90/64 03/16/19 1111  Temp 35.9 C 03/16/19 1114  Pulse 114 03/16/19 1109  Resp 12 03/16/19 1114  SpO2 92 % 03/16/19 1109  Vitals shown include unvalidated device data.  Last Pain:  Vitals:   02/26/2019 1721  TempSrc:   PainSc: 1          Complications: No apparent anesthesia complications

## 2019-03-16 NOTE — Progress Notes (Signed)
*  PRELIMINARY RESULTS* Echocardiogram 2D Echocardiogram LIMITED has been performed.  Madeline [name], Madeline [name] 03/16/2019, 2:55 PM

## 2019-03-16 NOTE — Anesthesia Postprocedure Evaluation (Signed)
Anesthesia Post Note  Patient: Madeline [name]  Procedure(s) Performed: REPLACEMENT OF ASCENDING AORTA USING HEMASHIELD PLATINUM 28MM and 30MM VASCULAR GRAFT (N/A Chest)     Patient location during evaluation: SICU Anesthesia Type: General Level of consciousness: sedated Pain management: pain level controlled Vital Signs Assessment: post-procedure vital signs reviewed and stable Respiratory status: patient remains intubated per anesthesia plan Cardiovascular status: tachycardic Postop Assessment: no apparent nausea or vomiting Anesthetic complications: no    Last Vitals:  Vitals:   03/16/19 2000 03/16/19 2010  BP: (!) 74/62   Pulse: (!) 112 (!) 111  Resp: (!) 24 (!) 23  Temp: 37.4 C 37.4 C  SpO2: 92% 92%    Last Pain:  Vitals:   02/26/2019 1721  TempSrc:   PainSc: 1                  Ryan P Ellender

## 2019-03-16 NOTE — Anesthesia Procedure Notes (Signed)
Arterial Line Insertion Start/End06/07/2019 9:20 PM, 03/15/2019 9:26 PM Performed by: Laretta Alstrom, CRNA, CRNA  Patient sedated Left, radial was placed Catheter size: 20 G Hand hygiene performed  and maximum sterile barriers used   Attempts: 1 Procedure performed using ultrasound guided technique. Ultrasound Notes:anatomy identified Following insertion, dressing applied and Biopatch. Post procedure assessment: normal

## 2019-03-16 NOTE — Brief Op Note (Signed)
03/13/2019 - 03/16/2019  11:35 AM  PATIENT:  Madeline [name]  73 y.o. female  PRE-OPERATIVE DIAGNOSIS:  AORTIC DISSECTION  POST-OPERATIVE DIAGNOSIS:  AORTIC DISSECTION  PROCEDURE:  Procedure(s): REPLACEMENT OF ASCENDING AORTA USING HEMASHIELD PLATINUM 28MM and 30MM VASCULAR GRAFT (N/A) Hemiarch replacement  SURGEON: Surgeon(s) and Role:    Ivin Poot, MD - Primary  PHYSICIAN ASSISTANT:   ASSISTANTS: [facility]   ANESTHESIA:   general  EBL:  850 cc   BLOOD ADMINISTERED:3 units PLTS  DRAINS: anterior mediastinal and bilateral pleural   LOCAL MEDICATIONS USED:  NONE  SPECIMEN:  No Specimen and Excision  DISPOSITION OF SPECIMEN:  PATHOLOGY  COUNTS:  YES  TOURNIQUET:  * No tourniquets in log *  DICTATION: .Dragon Dictation  PLAN OF CARE: Admit to inpatient   PATIENT DISPOSITION:  ICU - intubated and critically ill.   Delay start of Pharmacological VTE agent (>24hrs) due to surgical blood loss or risk of bleeding: yes

## 2019-03-17 ENCOUNTER — Inpatient Hospital Stay: Payer: Self-pay

## 2019-03-17 ENCOUNTER — Inpatient Hospital Stay (HOSPITAL_COMMUNITY): Payer: Medicare Other

## 2019-03-17 LAB — BPAM CRYOPRECIPITATE
Blood Product Expiration Date: 202006271010
Blood Product Expiration Date: 202006271145
ISSUE DATE / TIME: 202006270436
ISSUE DATE / TIME: 202006270615
Unit Type and Rh: 6200
Unit Type and Rh: 6200

## 2019-03-17 LAB — POCT I-STAT 7, (LYTES, BLD GAS, ICA,H+H)
Acid-Base Excess: 1 mmol/L (ref 0.0–2.0)
Acid-Base Excess: 1 mmol/L (ref 0.0–2.0)
Acid-Base Excess: 2 mmol/L (ref 0.0–2.0)
Bicarbonate: 23.4 mmol/L (ref 20.0–28.0)
Bicarbonate: 23.7 mmol/L (ref 20.0–28.0)
Bicarbonate: 23.5 mmol/L (ref 20.0–28.0)
Calcium, Ion: 1.36 mmol/L (ref 1.15–1.40)
Calcium, Ion: 1.36 mmol/L (ref 1.15–1.40)
Calcium, Ion: 1.25 mmol/L (ref 1.15–1.40)
HCT: 50 % — ABNORMAL HIGH (ref 36.0–46.0)
HCT: 48 % — ABNORMAL HIGH (ref 36.0–46.0)
HCT: 41 % (ref 36.0–46.0)
Hemoglobin: 17 g/dL — ABNORMAL HIGH (ref 12.0–15.0)
Hemoglobin: 16.3 g/dL — ABNORMAL HIGH (ref 12.0–15.0)
Hemoglobin: 13.9 g/dL (ref 12.0–15.0)
O2 Saturation: 90 %
O2 Saturation: 94 %
O2 Saturation: 97 %
Patient temperature: 37.8
Patient temperature: 37.7
Patient temperature: 37.3
Potassium: 3.9 mmol/L (ref 3.5–5.1)
Potassium: 3.8 mmol/L (ref 3.5–5.1)
Potassium: 3.7 mmol/L (ref 3.5–5.1)
Sodium: 148 mmol/L — ABNORMAL HIGH (ref 135–145)
Sodium: 147 mmol/L — ABNORMAL HIGH (ref 135–145)
Sodium: 147 mmol/L — ABNORMAL HIGH (ref 135–145)
TCO2: 24 mmol/L (ref 22–32)
TCO2: 25 mmol/L (ref 22–32)
TCO2: 24 mmol/L (ref 22–32)
pCO2 arterial: 31.7 mmHg — ABNORMAL LOW (ref 32.0–48.0)
pCO2 arterial: 32.4 mmHg (ref 32.0–48.0)
pCO2 arterial: 29.5 mmHg — ABNORMAL LOW (ref 32.0–48.0)
pH, Arterial: 7.478 — ABNORMAL HIGH (ref 7.350–7.450)
pH, Arterial: 7.475 — ABNORMAL HIGH (ref 7.350–7.450)
pH, Arterial: 7.512 — ABNORMAL HIGH (ref 7.350–7.450)
pO2, Arterial: 56 mmHg — ABNORMAL LOW (ref 83.0–108.0)
pO2, Arterial: 69 mmHg — ABNORMAL LOW (ref 83.0–108.0)
pO2, Arterial: 80 mmHg — ABNORMAL LOW (ref 83.0–108.0)

## 2019-03-17 LAB — COMPREHENSIVE METABOLIC PANEL
ALT: 53 U/L — ABNORMAL HIGH (ref 0–44)
AST: 116 U/L — ABNORMAL HIGH (ref 15–41)
Albumin: 2.9 g/dL — ABNORMAL LOW (ref 3.5–5.0)
Alkaline Phosphatase: 30 U/L — ABNORMAL LOW (ref 38–126)
Anion gap: 9 (ref 5–15)
BUN: 18 mg/dL (ref 8–23)
CO2: 23 mmol/L (ref 22–32)
Calcium: 9.6 mg/dL (ref 8.9–10.3)
Chloride: 113 mmol/L — ABNORMAL HIGH (ref 98–111)
Creatinine, Ser: 1.88 mg/dL — ABNORMAL HIGH (ref 0.44–1.00)
GFR calc Af Amer: 30 mL/min — ABNORMAL LOW (ref >60)
GFR calc non Af Amer: 26 mL/min — ABNORMAL LOW (ref >60)
Glucose, Bld: 123 mg/dL — ABNORMAL HIGH (ref 70–99)
Potassium: 3.9 mmol/L (ref 3.5–5.1)
Sodium: 145 mmol/L (ref 135–145)
Total Bilirubin: 5.2 mg/dL — ABNORMAL HIGH (ref 0.3–1.2)
Total Protein: 3.9 g/dL — ABNORMAL LOW (ref 6.5–8.1)

## 2019-03-17 LAB — BPAM PLATELET PHERESIS
Blood Product Expiration Date: 202006282359
Blood Product Expiration Date: 202006282359
ISSUE DATE / TIME: 202006270405
ISSUE DATE / TIME: 202006270405
Unit Type and Rh: 6200
Unit Type and Rh: 6200

## 2019-03-17 LAB — CBC
HCT: 51.2 % — ABNORMAL HIGH (ref 36.0–46.0)
HCT: 40.4 % (ref 36.0–46.0)
Hemoglobin: 18.4 g/dL — ABNORMAL HIGH (ref 12.0–15.0)
Hemoglobin: 14.3 g/dL (ref 12.0–15.0)
MCH: 29.4 pg (ref 26.0–34.0)
MCH: 29.2 pg (ref 26.0–34.0)
MCHC: 35.9 g/dL (ref 30.0–36.0)
MCHC: 35.4 g/dL (ref 30.0–36.0)
MCV: 81.8 fL (ref 80.0–100.0)
MCV: 82.6 fL (ref 80.0–100.0)
Platelets: 60 10*3/uL — ABNORMAL LOW (ref 150–400)
Platelets: 33 10*3/uL — ABNORMAL LOW (ref 150–400)
RBC: 6.26 MIL/uL — ABNORMAL HIGH (ref 3.87–5.11)
RBC: 4.89 MIL/uL (ref 3.87–5.11)
RDW: 14.4 % (ref 11.5–15.5)
RDW: 14.7 % (ref 11.5–15.5)
WBC: 20.9 10*3/uL — ABNORMAL HIGH (ref 4.0–10.5)
WBC: 19.2 10*3/uL — ABNORMAL HIGH (ref 4.0–10.5)
nRBC: 0.1 % (ref 0.0–0.2)
nRBC: 0.3 % — ABNORMAL HIGH (ref 0.0–0.2)

## 2019-03-17 LAB — PREPARE CRYOPRECIPITATE
Unit division: 0
Unit division: 0

## 2019-03-17 LAB — COOXEMETRY PANEL
Carboxyhemoglobin: 0.5 % (ref 0.5–1.5)
Carboxyhemoglobin: 0.6 % (ref 0.5–1.5)
Carboxyhemoglobin: 0.6 % (ref 0.5–1.5)
Methemoglobin: 0.6 % (ref 0.0–1.5)
Methemoglobin: 0.6 % (ref 0.0–1.5)
Methemoglobin: 0.7 % (ref 0.0–1.5)
O2 Saturation: 63.4 %
O2 Saturation: 66.1 %
O2 Saturation: 78.8 %
Total hemoglobin: 17.9 g/dL — ABNORMAL HIGH (ref 12.0–16.0)
Total hemoglobin: 16.4 g/dL — ABNORMAL HIGH (ref 12.0–16.0)
Total hemoglobin: 14.9 g/dL (ref 12.0–16.0)

## 2019-03-17 LAB — GLUCOSE, CAPILLARY
Glucose-Capillary: 105 mg/dL — ABNORMAL HIGH (ref 70–99)
Glucose-Capillary: 107 mg/dL — ABNORMAL HIGH (ref 70–99)
Glucose-Capillary: 108 mg/dL — ABNORMAL HIGH (ref 70–99)
Glucose-Capillary: 109 mg/dL — ABNORMAL HIGH (ref 70–99)
Glucose-Capillary: 111 mg/dL — ABNORMAL HIGH (ref 70–99)
Glucose-Capillary: 111 mg/dL — ABNORMAL HIGH (ref 70–99)
Glucose-Capillary: 114 mg/dL — ABNORMAL HIGH (ref 70–99)
Glucose-Capillary: 116 mg/dL — ABNORMAL HIGH (ref 70–99)
Glucose-Capillary: 117 mg/dL — ABNORMAL HIGH (ref 70–99)
Glucose-Capillary: 117 mg/dL — ABNORMAL HIGH (ref 70–99)
Glucose-Capillary: 122 mg/dL — ABNORMAL HIGH (ref 70–99)
Glucose-Capillary: 123 mg/dL — ABNORMAL HIGH (ref 70–99)
Glucose-Capillary: 126 mg/dL — ABNORMAL HIGH (ref 70–99)
Glucose-Capillary: 138 mg/dL — ABNORMAL HIGH (ref 70–99)
Glucose-Capillary: 162 mg/dL — ABNORMAL HIGH (ref 70–99)
Glucose-Capillary: 174 mg/dL — ABNORMAL HIGH (ref 70–99)
Glucose-Capillary: 87 mg/dL (ref 70–99)
Glucose-Capillary: 97 mg/dL (ref 70–99)

## 2019-03-17 LAB — BASIC METABOLIC PANEL
Anion gap: 14 (ref 5–15)
BUN: 26 mg/dL — ABNORMAL HIGH (ref 8–23)
CO2: 23 mmol/L (ref 22–32)
Calcium: 8.8 mg/dL — ABNORMAL LOW (ref 8.9–10.3)
Chloride: 109 mmol/L (ref 98–111)
Creatinine, Ser: 2.58 mg/dL — ABNORMAL HIGH (ref 0.44–1.00)
GFR calc Af Amer: 21 mL/min — ABNORMAL LOW (ref >60)
GFR calc non Af Amer: 18 mL/min — ABNORMAL LOW (ref >60)
Glucose, Bld: 211 mg/dL — ABNORMAL HIGH (ref 70–99)
Potassium: 3.9 mmol/L (ref 3.5–5.1)
Sodium: 146 mmol/L — ABNORMAL HIGH (ref 135–145)

## 2019-03-17 LAB — POCT I-STAT 4, (NA,K, GLUC, HGB,HCT)
Glucose, Bld: 118 mg/dL — ABNORMAL HIGH (ref 70–99)
HCT: 43 % (ref 36.0–46.0)
Hemoglobin: 14.6 g/dL (ref 12.0–15.0)
Potassium: 3.7 mmol/L (ref 3.5–5.1)
Sodium: 149 mmol/L — ABNORMAL HIGH (ref 135–145)

## 2019-03-17 LAB — PREPARE FRESH FROZEN PLASMA
Unit division: 0
Unit division: 0

## 2019-03-17 LAB — PREPARE PLATELET PHERESIS
Unit division: 0
Unit division: 0

## 2019-03-17 LAB — BPAM FFP
Blood Product Expiration Date: 202007012359
Blood Product Expiration Date: 202007012359
ISSUE DATE / TIME: 202006270402
ISSUE DATE / TIME: 202006270402
Unit Type and Rh: 600
Unit Type and Rh: 6200

## 2019-03-17 LAB — MAGNESIUM
Magnesium: 2.7 mg/dL — ABNORMAL HIGH (ref 1.7–2.4)
Magnesium: 2.6 mg/dL — ABNORMAL HIGH (ref 1.7–2.4)

## 2019-03-17 LAB — ECHO INTRAOPERATIVE TEE
Height: 68 in
Weight: 2814.83 oz

## 2019-03-17 MED ORDER — INSULIN DETEMIR 100 UNIT/ML ~~LOC~~ SOLN
12.0000 [IU] | Freq: Every day | SUBCUTANEOUS | Status: DC
  Filled 2019-03-17: qty 0.12

## 2019-03-17 MED ORDER — FUROSEMIDE 10 MG/ML IJ SOLN
100.0000 mg | Freq: Once | INTRAVENOUS | Status: AC
  Administered 2019-03-17: 100 mg via INTRAVENOUS
  Filled 2019-03-17: qty 10

## 2019-03-17 MED ORDER — SODIUM CHLORIDE 0.9 % IV SOLN
2.0000 g | Freq: Every day | INTRAVENOUS | Status: DC
  Administered 2019-03-17 – 2019-03-20 (×4): 2 g via INTRAVENOUS
  Filled 2019-03-17 (×2): qty 2
  Filled 2019-03-17: qty 20
  Filled 2019-03-17: qty 2

## 2019-03-17 MED ORDER — AMIODARONE HCL IN DEXTROSE 360-4.14 MG/200ML-% IV SOLN
30.0000 mg/h | INTRAVENOUS | Status: DC
  Administered 2019-03-17: 30 mg/h via INTRAVENOUS

## 2019-03-17 MED ORDER — LEVETIRACETAM IN NACL 1000 MG/100ML IV SOLN
1000.0000 mg | Freq: Two times a day (BID) | INTRAVENOUS | Status: DC
  Administered 2019-03-17 – 2019-03-22 (×11): 1000 mg via INTRAVENOUS
  Filled 2019-03-17 (×11): qty 100

## 2019-03-17 MED ORDER — LEVETIRACETAM IN NACL 500 MG/100ML IV SOLN
500.0000 mg | Freq: Two times a day (BID) | INTRAVENOUS | Status: DC
  Administered 2019-03-17: 500 mg via INTRAVENOUS
  Filled 2019-03-17: qty 100

## 2019-03-17 MED ORDER — ALBUMIN HUMAN 5 % IV SOLN
25.0000 g | Freq: Four times a day (QID) | INTRAVENOUS | Status: AC
  Administered 2019-03-17 – 2019-03-18 (×3): 25 g via INTRAVENOUS
  Filled 2019-03-17: qty 250
  Filled 2019-03-17: qty 500
  Filled 2019-03-17 (×3): qty 250

## 2019-03-17 MED ORDER — AMIODARONE HCL IN DEXTROSE 360-4.14 MG/200ML-% IV SOLN
60.0000 mg/h | INTRAVENOUS | Status: AC
  Administered 2019-03-17: 23:00:00 60 mg/h via INTRAVENOUS
  Filled 2019-03-17 (×2): qty 200

## 2019-03-17 MED ORDER — SODIUM CHLORIDE 0.9 % IV SOLN
1.0000 g | INTRAVENOUS | Status: DC
  Filled 2019-03-17: qty 10

## 2019-03-17 MED ORDER — INSULIN DETEMIR 100 UNIT/ML ~~LOC~~ SOLN
15.0000 [IU] | Freq: Two times a day (BID) | SUBCUTANEOUS | Status: DC
  Administered 2019-03-17: 15 [IU] via SUBCUTANEOUS
  Filled 2019-03-17 (×2): qty 0.15

## 2019-03-17 MED ORDER — PANTOPRAZOLE SODIUM 40 MG IV SOLR
40.0000 mg | INTRAVENOUS | Status: DC
  Administered 2019-03-17 – 2019-03-22 (×6): 40 mg via INTRAVENOUS
  Filled 2019-03-17 (×6): qty 40

## 2019-03-17 MED ORDER — CISATRACURIUM BOLUS VIA INFUSION
5.0000 mg | Freq: Three times a day (TID) | INTRAVENOUS | Status: DC | PRN
  Filled 2019-03-17: qty 5

## 2019-03-17 MED ORDER — INSULIN ASPART 100 UNIT/ML ~~LOC~~ SOLN: 1.0000 [IU] | SUBCUTANEOUS | Status: DC

## 2019-03-17 MED ORDER — AMIODARONE IV BOLUS ONLY 150 MG/100ML
150.0000 mg | Freq: Once | INTRAVENOUS | Status: AC
  Administered 2019-03-17: 150 mg via INTRAVENOUS

## 2019-03-17 MED ORDER — DEXMEDETOMIDINE HCL IN NACL 400 MCG/100ML IV SOLN
0.0000 ug/kg/h | INTRAVENOUS | Status: DC
  Administered 2019-03-17 – 2019-03-18 (×3): 0.7 ug/kg/h via INTRAVENOUS
  Filled 2019-03-17 (×3): qty 100

## 2019-03-17 MED ORDER — AMIODARONE IV BOLUS ONLY 150 MG/100ML: 150.0000 mg | Freq: Once | INTRAVENOUS | Status: DC

## 2019-03-17 MED ORDER — AMIODARONE HCL IN DEXTROSE 360-4.14 MG/200ML-% IV SOLN
60.0000 mg/h | INTRAVENOUS | Status: AC
  Administered 2019-03-17 (×2): 60 mg/h via INTRAVENOUS
  Filled 2019-03-17 (×3): qty 200

## 2019-03-17 MED ORDER — LEVETIRACETAM IN NACL 1000 MG/100ML IV SOLN: 1000.0000 mg | Freq: Two times a day (BID) | INTRAVENOUS | Status: DC

## 2019-03-17 MED ORDER — CISATRACURIUM BESYLATE (PF) 10 MG/5ML IV SOLN
5.0000 mg | Freq: Three times a day (TID) | INTRAVENOUS | Status: DC | PRN
  Filled 2019-03-17: qty 2.5

## 2019-03-17 MED ORDER — NOREPINEPHRINE 16 MG/250ML-% IV SOLN
2.0000 ug/min | INTRAVENOUS | Status: DC
  Administered 2019-03-17: 8 ug/min via INTRAVENOUS
  Administered 2019-03-19: 01:00:00 10 ug/min via INTRAVENOUS
  Administered 2019-03-20: 8 ug/min via INTRAVENOUS
  Administered 2019-03-22: 6 ug/min via INTRAVENOUS
  Administered 2019-03-22: 15 ug/min via INTRAVENOUS
  Administered 2019-03-23: 50 ug/min via INTRAVENOUS
  Filled 2019-03-17 (×8): qty 250

## 2019-03-17 MED ORDER — ALBUMIN HUMAN 25 % IV SOLN
12.5000 g | Freq: Once | INTRAVENOUS | Status: AC
  Administered 2019-03-17: 12.5 g via INTRAVENOUS
  Filled 2019-03-17: qty 50

## 2019-03-17 MED ORDER — DEXAMETHASONE SODIUM PHOSPHATE 10 MG/ML IJ SOLN
10.0000 mg | Freq: Once | INTRAMUSCULAR | Status: AC
  Administered 2019-03-17: 10 mg via INTRAVENOUS
  Filled 2019-03-17: qty 1

## 2019-03-17 MED ORDER — AMIODARONE LOAD VIA INFUSION
150.0000 mg | Freq: Once | INTRAVENOUS | Status: AC
  Administered 2019-03-17: 150 mg via INTRAVENOUS
  Filled 2019-03-17: qty 83.34

## 2019-03-17 MED ORDER — INSULIN ASPART 100 UNIT/ML ~~LOC~~ SOLN
0.0000 [IU] | SUBCUTANEOUS | Status: DC
  Administered 2019-03-17: 16:00:00 2 [IU] via SUBCUTANEOUS
  Administered 2019-03-17: 4 [IU] via SUBCUTANEOUS
  Administered 2019-03-18 (×2): 2 [IU] via SUBCUTANEOUS
  Administered 2019-03-18: 4 [IU] via SUBCUTANEOUS
  Administered 2019-03-18 – 2019-03-23 (×3): 2 [IU] via SUBCUTANEOUS

## 2019-03-17 MED ORDER — CISATRACURIUM BESYLATE (PF) 10 MG/5ML IV SOLN
5.0000 mg | Freq: Three times a day (TID) | INTRAVENOUS | Status: DC | PRN
  Administered 2019-03-17: 17:00:00 5 mg via INTRAVENOUS
  Filled 2019-03-17 (×2): qty 2.5

## 2019-03-17 MED ORDER — FUROSEMIDE 10 MG/ML IJ SOLN
15.0000 mg/h | INTRAVENOUS | Status: DC
  Administered 2019-03-17 – 2019-03-19 (×3): 10 mg/h via INTRAVENOUS
  Administered 2019-03-20 – 2019-03-22 (×4): 15 mg/h via INTRAVENOUS
  Filled 2019-03-17 (×5): qty 25
  Filled 2019-03-17: qty 21
  Filled 2019-03-17 (×2): qty 25
  Filled 2019-03-17: qty 20
  Filled 2019-03-17 (×2): qty 25

## 2019-03-17 MED ORDER — AMIODARONE HCL IN DEXTROSE 360-4.14 MG/200ML-% IV SOLN: 60.0000 mg/h | INTRAVENOUS | Status: DC

## 2019-03-17 MED ORDER — MILRINONE LACTATE IN DEXTROSE 20-5 MG/100ML-% IV SOLN
0.3000 ug/kg/min | INTRAVENOUS | Status: DC
  Administered 2019-03-17: 0.3 ug/kg/min via INTRAVENOUS
  Filled 2019-03-17 (×2): qty 100

## 2019-03-17 MED ORDER — AMIODARONE HCL IN DEXTROSE 360-4.14 MG/200ML-% IV SOLN: 30.0000 mg/h | INTRAVENOUS | Status: DC

## 2019-03-17 MED ORDER — AMIODARONE IV BOLUS ONLY 150 MG/100ML
150.0000 mg | Freq: Once | INTRAVENOUS | Status: AC
  Administered 2019-03-17: 19:00:00 150 mg via INTRAVENOUS

## 2019-03-17 MED ORDER — LEVALBUTEROL HCL 0.63 MG/3ML IN NEBU
0.6300 mg | INHALATION_SOLUTION | Freq: Four times a day (QID) | RESPIRATORY_TRACT | Status: DC
  Administered 2019-03-17 – 2019-03-20 (×16): 0.63 mg via RESPIRATORY_TRACT
  Filled 2019-03-17 (×16): qty 3

## 2019-03-17 MED ORDER — MILRINONE LACTATE IN DEXTROSE 20-5 MG/100ML-% IV SOLN
0.2500 ug/kg/min | INTRAVENOUS | Status: DC
  Administered 2019-03-17 – 2019-03-20 (×5): 0.25 ug/kg/min via INTRAVENOUS
  Filled 2019-03-17 (×4): qty 100

## 2019-03-17 NOTE — Progress Notes (Signed)
Spoke with Judson Roch, RN concerning order for PICC. Order placed for a DL. Plans to move forward and place PICC.

## 2019-03-17 NOTE — Progress Notes (Signed)
CT surgery p.m. Rounds  Patient remains unresponsive on ventilator with some tachypnea and hypertension response to repositioning PO2 improving but still has 100% FiO2 requirement Postoperative rapid atrial fibrillation now on IV amiodarone protocol Urine output 20 cc/h on Lasix drip Evening coox 78%

## 2019-03-17 NOTE — Progress Notes (Signed)
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  Surrounding intramural hematoma of all the major vessels. Patient's hemodynamics have slowly improved over the past 12 hours.  Mixed venous saturation now over 60%.  She remains on low-dose inotropes.  Postoperative amiodarone started for SVT.  Bleeding has been controlled since leaving the operating room with 40-50 cc chest tube output per hour.  Hematocrit hemoglobin stable.    Chest x-ray this a.m. shows improved edema with improved acid-base status over the past 12 hours.  Patient still requires 8 cm PEEP and FiO2 100%  Renal function remains depressed with oliguria despite dosing with Lasix.  Creatinine 1.8 this morning Neuro status with minimal responsiveness or reaction to pain.  Patient still on medium dose Precedex.  Facial twitching present, pupils small and equal, IV Keppra started for possible postoperative seizure activity.   Objective: Vital signs in last 24 hours: Temp:  [99.1 F (37.3 C)-100.2 F (37.9 C)] 99.1 F (37.3 C) (06/28 1215) Pulse Rate:  [104-170] 113 (06/28 1215) Cardiac Rhythm: Sinus tachycardia (06/28 0800) Resp:  [16-26] 19 (06/28 1215) BP: (63-114)/(36-71) 114/52 (06/28 1200) SpO2:  [86 %-96 %] 96 % (06/28 1215) Arterial Line BP: (73-146)/(40-76) 109/51 (06/28 1215) FiO2 (%):  [100 %] 100 % (06/28 1138) Weight:  [97.8 kg] 97.8 kg (06/28 0530)  Hemodynamic parameters for last 24 hours: PAP: (20-45)/(9-27) 23/15 CVP:  [10 mmHg-31 mmHg] 16 mmHg CO:  [1 L/min-3.5 L/min] 3.2 L/min CI:  [0.5 L/min/m2-1.8 L/min/m2] 1.7 L/min/m2  Intake/Output from previous day: 06/27 0701 - 06/28 0700 In: 21921.2 [I.V.:9382.1; AOZHY:86578; IV Piggyback:1757.1] Out: 1434  [Urine:595; Chest Tube:839] Intake/Output this shift: Total I/O In: 438.6 [I.V.:298.2; IV Piggyback:140.4] Out: 96 [Urine:46; Chest Tube:50]  Sedated on ventilator Lungs clear Chest tubes with minimal output Heart rhythm regular without murmur Abdomen soft Extremities warm No response to stimulus  Lab Results: Recent Labs    03/16/19 1718  03/17/19 0200 03/17/19 0422  WBC 19.1*  --  20.9*  --   HGB 19.6*   < > 18.4* 16.3*  HCT 55.3*   < > 51.2* 48.0*  PLT 61*  --  60*  --    < > = values in this interval not displayed.   BMET:  Recent Labs    03/16/19 1718 03/16/19 1729  03/17/19 0200 03/17/19 0422  NA 151* 149*   < > 145 147*  K 3.5 3.4*   < > 3.9 3.8  CL 113* 110  --  113*  --   CO2 23  --   --  23  --   GLUCOSE 136* 132*  --  123*  --   BUN 13 15  --  18  --   CREATININE 1.43* 1.10*  --  1.88*  --   CALCIUM 10.3  --   --  9.6  --    < > = values in this interval not displayed.    PT/INR:  Recent Labs    03/16/19 1321  LABPROT 17.3*  INR 1.4*   ABG    Component Value Date/Time   PHART 7.475 (H) 03/17/2019 0422   HCO3 23.7 03/17/2019 0422   TCO2 25 03/17/2019 0422   ACIDBASEDEF 1.0 03/16/2019 1851   O2SAT 66.1 03/17/2019 1000   CBG (last 3)  Recent Labs    03/17/19 1044 03/17/19 1145 03/17/19 1251  GLUCAP 114* 122* 123*    Assessment/Plan: S/P Procedure(s) (LRB): REPLACEMENT OF ASCENDING AORTA USING HEMASHIELD PLATINUM 28MM and 30MM VASCULAR GRAFT (N/A) Status post emergency repair of aortic dissection with large intramural hematoma of the aorta and arch from a penetrating ulcer.  Patient with multiorgan dysfunction which should improve with time but neuro status is concerning and will continue to monitor.   LOS: 2 days    Madeline [name] 03/17/2019

## 2019-03-17 NOTE — Progress Notes (Signed)
Spoke with Judson Roch, RN concerning PICC placement. Dr Prescott Gum spoke with husband concerning need for a PICC. The patient has adequate IV access, but it was asked if we could hold this off until the morning. Plan will be to place in am.

## 2019-03-17 NOTE — Progress Notes (Signed)
Telephoned husband to get consent for PICC placement. Husband became upset and stated he wasn't aware of her condition this morning. The last thing he heard about his wife was yesterday. She had a procedure done and the husband said, "I don't know if she's alive, Sir." I reassured the husband that I would inform the patient's nurse and see if Dr. Prescott Gum would speak to him, as the husband refused to consent until he had further understanding.

## 2019-03-17 NOTE — Progress Notes (Signed)
Consent for PICC placement obtained from patient's husband via phone. Verified with second RN [facility]. Consent placed in patient chart.   Alma Friendly RN

## 2019-03-18 ENCOUNTER — Inpatient Hospital Stay (HOSPITAL_COMMUNITY): Payer: Medicare Other

## 2019-03-18 ENCOUNTER — Encounter (HOSPITAL_COMMUNITY): Payer: Self-pay | Admitting: Cardiothoracic Surgery

## 2019-03-18 DIAGNOSIS — G40901 Epilepsy, unspecified, not intractable, with status epilepticus: Secondary | ICD-10-CM

## 2019-03-18 LAB — POCT I-STAT 7, (LYTES, BLD GAS, ICA,H+H)
Acid-Base Excess: 2 mmol/L (ref 0.0–2.0)
Acid-Base Excess: 3 mmol/L — ABNORMAL HIGH (ref 0.0–2.0)
Acid-base deficit: 2 mmol/L (ref 0.0–2.0)
Bicarbonate: 22.9 mmol/L (ref 20.0–28.0)
Bicarbonate: 23.5 mmol/L (ref 20.0–28.0)
Bicarbonate: 25.4 mmol/L (ref 20.0–28.0)
Calcium, Ion: 1.14 mmol/L — ABNORMAL LOW (ref 1.15–1.40)
Calcium, Ion: 1.19 mmol/L (ref 1.15–1.40)
Calcium, Ion: 1.22 mmol/L (ref 1.15–1.40)
HCT: 35 % — ABNORMAL LOW (ref 36.0–46.0)
HCT: 35 % — ABNORMAL LOW (ref 36.0–46.0)
HCT: 36 % (ref 36.0–46.0)
Hemoglobin: 11.9 g/dL — ABNORMAL LOW (ref 12.0–15.0)
Hemoglobin: 11.9 g/dL — ABNORMAL LOW (ref 12.0–15.0)
Hemoglobin: 12.2 g/dL (ref 12.0–15.0)
O2 Saturation: 96 %
O2 Saturation: 97 %
O2 Saturation: 98 %
Patient temperature: 36.2
Patient temperature: 36.9
Patient temperature: 37
Potassium: 3.5 mmol/L (ref 3.5–5.1)
Potassium: 3.5 mmol/L (ref 3.5–5.1)
Potassium: 3.8 mmol/L (ref 3.5–5.1)
Sodium: 143 mmol/L (ref 135–145)
Sodium: 145 mmol/L (ref 135–145)
Sodium: 146 mmol/L — ABNORMAL HIGH (ref 135–145)
TCO2: 24 mmol/L (ref 22–32)
TCO2: 24 mmol/L (ref 22–32)
TCO2: 26 mmol/L (ref 22–32)
pCO2 arterial: 28.1 mmHg — ABNORMAL LOW (ref 32.0–48.0)
pCO2 arterial: 31 mmHg — ABNORMAL LOW (ref 32.0–48.0)
pCO2 arterial: 35.8 mmHg (ref 32.0–48.0)
pH, Arterial: 7.411 (ref 7.350–7.450)
pH, Arterial: 7.521 — ABNORMAL HIGH (ref 7.350–7.450)
pH, Arterial: 7.531 — ABNORMAL HIGH (ref 7.350–7.450)
pO2, Arterial: 74 mmHg — ABNORMAL LOW (ref 83.0–108.0)
pO2, Arterial: 82 mmHg — ABNORMAL LOW (ref 83.0–108.0)
pO2, Arterial: 90 mmHg (ref 83.0–108.0)

## 2019-03-18 LAB — PREPARE FRESH FROZEN PLASMA
Unit division: 0
Unit division: 0
Unit division: 0
Unit division: 0

## 2019-03-18 LAB — GLUCOSE, CAPILLARY
Glucose-Capillary: 147 mg/dL — ABNORMAL HIGH (ref 70–99)
Glucose-Capillary: 138 mg/dL — ABNORMAL HIGH (ref 70–99)
Glucose-Capillary: 117 mg/dL — ABNORMAL HIGH (ref 70–99)
Glucose-Capillary: 111 mg/dL — ABNORMAL HIGH (ref 70–99)
Glucose-Capillary: 103 mg/dL — ABNORMAL HIGH (ref 70–99)
Glucose-Capillary: 124 mg/dL — ABNORMAL HIGH (ref 70–99)

## 2019-03-18 LAB — PREPARE PLATELET PHERESIS: Unit division: 0

## 2019-03-18 LAB — CBC
HCT: 36.2 % (ref 36.0–46.0)
Hemoglobin: 12.7 g/dL (ref 12.0–15.0)
MCH: 29.2 pg (ref 26.0–34.0)
MCHC: 35.1 g/dL (ref 30.0–36.0)
MCV: 83.2 fL (ref 80.0–100.0)
Platelets: 28 10*3/uL — CL (ref 150–400)
RBC: 4.35 MIL/uL (ref 3.87–5.11)
RDW: 14.7 % (ref 11.5–15.5)
WBC: 16.6 10*3/uL — ABNORMAL HIGH (ref 4.0–10.5)
nRBC: 0.4 % — ABNORMAL HIGH (ref 0.0–0.2)

## 2019-03-18 LAB — COOXEMETRY PANEL
Carboxyhemoglobin: 0.6 % (ref 0.5–1.5)
Carboxyhemoglobin: 0.7 % (ref 0.5–1.5)
Methemoglobin: 0.9 % (ref 0.0–1.5)
Methemoglobin: 0.8 % (ref 0.0–1.5)
O2 Saturation: 79.8 %
O2 Saturation: 78.6 %
Total hemoglobin: 12.6 g/dL (ref 12.0–16.0)
Total hemoglobin: 12.6 g/dL (ref 12.0–16.0)

## 2019-03-18 LAB — SURGICAL PCR SCREEN
MRSA, PCR: NEGATIVE
Staphylococcus aureus: POSITIVE — AB

## 2019-03-18 LAB — BPAM FFP
Blood Product Expiration Date: 202007022359
Blood Product Expiration Date: 202007022359
Blood Product Expiration Date: 202007022359
Blood Product Expiration Date: 202007022359
ISSUE DATE / TIME: 202006270855
ISSUE DATE / TIME: 202006270855
ISSUE DATE / TIME: 202006270855
ISSUE DATE / TIME: 202006280940
Unit Type and Rh: 600
Unit Type and Rh: 6200
Unit Type and Rh: 6200
Unit Type and Rh: 6200

## 2019-03-18 LAB — COMPREHENSIVE METABOLIC PANEL
ALT: 51 U/L — ABNORMAL HIGH (ref 0–44)
AST: 104 U/L — ABNORMAL HIGH (ref 15–41)
Albumin: 2.5 g/dL — ABNORMAL LOW (ref 3.5–5.0)
Alkaline Phosphatase: 40 U/L (ref 38–126)
Anion gap: 11 (ref 5–15)
BUN: 31 mg/dL — ABNORMAL HIGH (ref 8–23)
CO2: 24 mmol/L (ref 22–32)
Calcium: 8.5 mg/dL — ABNORMAL LOW (ref 8.9–10.3)
Chloride: 110 mmol/L (ref 98–111)
Creatinine, Ser: 3.12 mg/dL — ABNORMAL HIGH (ref 0.44–1.00)
GFR calc Af Amer: 16 mL/min — ABNORMAL LOW (ref >60)
GFR calc non Af Amer: 14 mL/min — ABNORMAL LOW (ref >60)
Glucose, Bld: 153 mg/dL — ABNORMAL HIGH (ref 70–99)
Potassium: 3.6 mmol/L (ref 3.5–5.1)
Sodium: 145 mmol/L (ref 135–145)
Total Bilirubin: 3.6 mg/dL — ABNORMAL HIGH (ref 0.3–1.2)
Total Protein: 3.6 g/dL — ABNORMAL LOW (ref 6.5–8.1)

## 2019-03-18 LAB — BPAM PLATELET PHERESIS
Blood Product Expiration Date: 202006302359
ISSUE DATE / TIME: 202006270858
Unit Type and Rh: 6200

## 2019-03-18 MED ORDER — MIDAZOLAM BOLUS VIA INFUSION
10.0000 mg | Freq: Once | INTRAVENOUS | Status: AC
  Administered 2019-03-18: 12:00:00 10 mg via INTRAVENOUS

## 2019-03-18 MED ORDER — MUPIROCIN CALCIUM 2 % EX CREA
TOPICAL_CREAM | Freq: Two times a day (BID) | CUTANEOUS | Status: DC
  Administered 2019-03-18 – 2019-03-22 (×10): via TOPICAL
  Filled 2019-03-18 (×2): qty 15

## 2019-03-18 MED ORDER — LORAZEPAM 2 MG/ML IJ SOLN
2.0000 mg | Freq: Once | INTRAMUSCULAR | Status: AC
  Administered 2019-03-18: 10:00:00 2 mg via INTRAVENOUS

## 2019-03-18 MED ORDER — AMIODARONE HCL IN DEXTROSE 360-4.14 MG/200ML-% IV SOLN
30.0000 mg/h | INTRAVENOUS | Status: DC
  Administered 2019-03-18: 19:00:00 60 mg/h via INTRAVENOUS
  Administered 2019-03-19: 22:00:00 30 mg/h via INTRAVENOUS
  Administered 2019-03-19 – 2019-03-21 (×10): 60 mg/h via INTRAVENOUS
  Administered 2019-03-22: 30 mg/h via INTRAVENOUS
  Administered 2019-03-22: 60 mg/h via INTRAVENOUS
  Filled 2019-03-18 (×16): qty 200

## 2019-03-18 MED ORDER — PROPOFOL 1000 MG/100ML IV EMUL
33.0000 ug/kg/min | INTRAVENOUS | Status: DC
  Administered 2019-03-18 – 2019-03-19 (×4): 33 ug/kg/min via INTRAVENOUS
  Administered 2019-03-19 – 2019-03-21 (×5): 25 ug/kg/min via INTRAVENOUS
  Filled 2019-03-18 (×7): qty 100
  Filled 2019-03-18: qty 200

## 2019-03-18 MED ORDER — MIDAZOLAM BOLUS VIA INFUSION
10.0000 mg | Freq: Once | INTRAVENOUS | Status: AC
  Administered 2019-03-18: 12:00:00 10 mg via INTRAVENOUS
  Filled 2019-03-18: qty 10

## 2019-03-18 MED ORDER — MIDAZOLAM BOLUS VIA INFUSION
10.0000 mg | Freq: Once | INTRAVENOUS | Status: AC
  Administered 2019-03-18: 10 mg via INTRAVENOUS

## 2019-03-18 MED ORDER — PROPOFOL BOLUS VIA INFUSION
1.0000 mg/kg | Freq: Once | INTRAVENOUS | Status: AC
  Administered 2019-03-18: 78.4 mg via INTRAVENOUS
  Filled 2019-03-18: qty 79

## 2019-03-18 MED ORDER — MIDAZOLAM BOLUS VIA INFUSION
10.0000 mg | Freq: Once | INTRAVENOUS | Status: AC
  Administered 2019-03-18: 10 mg via INTRAVENOUS
  Filled 2019-03-18: qty 10

## 2019-03-18 MED ORDER — MIDAZOLAM HCL-SODIUM CHLORIDE 100-0.9 MG/100ML-% IV SOLN: 100.0000 mg | INTRAVENOUS | Status: DC

## 2019-03-18 MED ORDER — SODIUM CHLORIDE 0.9 % IV SOLN
30.0000 mg/h | INTRAVENOUS | Status: DC
  Administered 2019-03-18 – 2019-03-20 (×7): 60 mg/h via INTRAVENOUS
  Filled 2019-03-18 (×8): qty 100

## 2019-03-18 MED ORDER — AMIODARONE HCL IN DEXTROSE 360-4.14 MG/200ML-% IV SOLN
60.0000 mg/h | INTRAVENOUS | Status: AC
  Administered 2019-03-18: 60 mg/h via INTRAVENOUS
  Filled 2019-03-18: qty 200

## 2019-03-18 MED ORDER — VALPROATE SODIUM 500 MG/5ML IV SOLN
500.0000 mg | Freq: Three times a day (TID) | INTRAVENOUS | Status: DC
  Administered 2019-03-18 – 2019-03-23 (×14): 500 mg via INTRAVENOUS
  Filled 2019-03-18 (×16): qty 5

## 2019-03-18 MED ORDER — SODIUM CHLORIDE 0.9 % IV SOLN
INTRAVENOUS | Status: AC
  Administered 2019-03-18: 16:00:00 via INTRAVENOUS

## 2019-03-18 MED ORDER — LORAZEPAM 2 MG/ML IJ SOLN
INTRAMUSCULAR | Status: AC
  Filled 2019-03-18: qty 1

## 2019-03-18 MED ORDER — MIDAZOLAM 50MG/50ML (1MG/ML) PREMIX INFUSION
24.0000 mg/h | INTRAVENOUS | Status: DC
  Administered 2019-03-18: 8 mg/h via INTRAVENOUS
  Administered 2019-03-18: 12:00:00 16 mg/h via INTRAVENOUS
  Filled 2019-03-18 (×2): qty 50

## 2019-03-18 MED ORDER — LORAZEPAM 2 MG/ML IJ SOLN
2.0000 mg | Freq: Once | INTRAMUSCULAR | Status: AC
  Administered 2019-03-18: 2 mg via INTRAVENOUS
  Filled 2019-03-18: qty 1

## 2019-03-18 MED ORDER — POTASSIUM CHLORIDE 10 MEQ/50ML IV SOLN
10.0000 meq | Freq: Once | INTRAVENOUS | Status: AC
  Administered 2019-03-18: 10 meq via INTRAVENOUS

## 2019-03-18 MED ORDER — SODIUM CHLORIDE 0.9 % IV SOLN
50.0000 mg/h | INTRAVENOUS | Status: DC
  Administered 2019-03-18: 32 mg/h via INTRAVENOUS
  Filled 2019-03-18 (×3): qty 20

## 2019-03-18 MED ORDER — VALPROATE SODIUM 500 MG/5ML IV SOLN
2000.0000 mg | Freq: Once | INTRAVENOUS | Status: AC
  Administered 2019-03-18: 2000 mg via INTRAVENOUS
  Filled 2019-03-18: qty 20

## 2019-03-18 MED ORDER — AMIODARONE HCL IN DEXTROSE 360-4.14 MG/200ML-% IV SOLN
60.0000 mg/h | INTRAVENOUS | Status: DC
  Administered 2019-03-18: 60 mg/h via INTRAVENOUS

## 2019-03-18 MED ORDER — METOLAZONE 5 MG PO TABS
10.0000 mg | ORAL_TABLET | Freq: Every day | ORAL | Status: DC
  Administered 2019-03-18: 10 mg via ORAL
  Filled 2019-03-18: qty 2

## 2019-03-18 MED ORDER — VALPROATE SODIUM 500 MG/5ML IV SOLN: 2000.0000 mg | Freq: Once | INTRAVENOUS | Status: DC

## 2019-03-18 MED FILL — Sodium Chloride IV Soln 0.9%: INTRAVENOUS | Qty: 9000 | Status: AC

## 2019-03-18 MED FILL — Albumin, Human Inj 5%: INTRAVENOUS | Qty: 250 | Status: AC

## 2019-03-18 MED FILL — Heparin Sodium (Porcine) Inj 1000 Unit/ML: INTRAMUSCULAR | Qty: 30 | Status: AC

## 2019-03-18 MED FILL — Mannitol IV Soln 20%: INTRAVENOUS | Qty: 500 | Status: AC

## 2019-03-18 MED FILL — Potassium Chloride Inj 2 mEq/ML: INTRAVENOUS | Qty: 40 | Status: AC

## 2019-03-18 MED FILL — Sodium Bicarbonate IV Soln 8.4%: INTRAVENOUS | Qty: 350 | Status: AC

## 2019-03-18 MED FILL — Magnesium Sulfate Inj 50%: INTRAMUSCULAR | Qty: 10 | Status: AC

## 2019-03-18 MED FILL — Heparin Sodium (Porcine) Inj 1000 Unit/ML: INTRAMUSCULAR | Qty: 80 | Status: AC

## 2019-03-18 MED FILL — Sodium Chloride IV Soln 0.9%: INTRAVENOUS | Qty: 6000 | Status: AC

## 2019-03-18 MED FILL — Calcium Chloride Inj 10%: INTRAVENOUS | Qty: 20 | Status: AC

## 2019-03-18 NOTE — Progress Notes (Signed)
Patient's EEG reviewed.  GPEDs appear better than before but still present. Recommended increasing propofol which also might in turn require increased pressors. Discussed with Dr. Servando Snare over the phone.  In Dr. Everrett Coombe opinion, going for the heart on pressors would be detrimental to the patient from the aortic surgery/repair standpoint and is not indicated. I would leave her on the current doses of the sedatives and on the continuous EEG. We will reassess her in the morning and come up with further strategies. She probably has anoxic brain injury, and is too unstable at this time to be taken in for an MRI to assess the extent of hypoxic/anoxic brain injury radiographically. Neurology will continue to follow with you. -- Amie Portland, MD [facility] Pager: [phone] If 7pm to 7am, please call on call as listed on AMION.

## 2019-03-18 NOTE — Progress Notes (Signed)
After Versed loading dose of 10 mg and infusion rate started at 8 mg/hr, EEG is improved, with decreased number of spike-wave complexes per page; also with lower amplitude and spikes are now more blunted.   Administering second 10 mg load of Versed and increasing rate to 16 mg/hr.   10 additional minutes of ICU time.   Electronically signed: Dr. Kerney Elbe

## 2019-03-18 NOTE — Progress Notes (Signed)
Initial Nutrition Assessment  RD working remotely.  DOCUMENTATION CODES:   Not applicable  INTERVENTION:   Tube feeding recommendations: - Vital 1.5 @ 35 ml/hr (840 ml/day) - Pro-stat 60 ml BID  Tube feeding regimen provides 1660 kcal, 117 grams of protein, and 642 ml of H2O (100% of needs).  NUTRITION DIAGNOSIS:   Increased nutrient needs related to post-op healing as evidenced by estimated needs.  GOAL:   Patient will meet greater than or equal to 90% of their needs  MONITOR:   I & O's, Labs, Weight trends, Vent status, Skin  REASON FOR ASSESSMENT:   Ventilator    ASSESSMENT:   73 year old female who presented to the ED on 6/26 with RUQ pain. PMH of LVH, septal hypertrophy, HTN, HLD. US showing cholelithiasis without cholecystitis. 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  6/26 - s/p emergent repair of penetrating ulcer and replacement of the ascending aorta under hypothermic circulatory arrest  Pt remains intubated post-op. Neurology consulted due to new onset seizures.  OGT in place. Currently to low intermittent suction.  Weight up 45 lbs since admit. Pt is net positive 22 L. Will utilize admission weight of 79.8 kg as EDW. This correlates with a BMI of 26.76.  Reviewed RN edema assessment. Pt with moderate pitting generalized edema, mild to moderate pitting edema to BUE, and moderate pitting edema to BLE.  RD will leave TF recommendations.  Patient is currently intubated on ventilator support MV: 12.6 L/min Temp (24hrs), Avg:98.8 F (37.1 C), Min:98.1 F (36.7 C), Max:99.5 F (37.5 C) BP: 102/46 MAP: 60  Drips: Precedex: 14 ml/hr Epinephrine: 11.2 ml/hr Levophed: 2.8 ml/hr Lasix: 10 ml/hr Amiodarone: 33.3 ml/hr Milrinone: 7.3 ml/hr NS: 10 ml/hr LR: 10 ml/hr  Medications reviewed and include: Dulcolax, Colace, SSI q 4 hours, Protonix, IV abx, IV Depacon, IV Keppra  Labs  reviewed: BUN 31, creatinine 3.12, elevated LFTs, platelets 28 CBG's: 123-174 x 24 hours  UOP: 320 ml x 24 hours CT: 280 ml x 24 hours I/O's: +22.4 L since admit  NUTRITION - FOCUSED PHYSICAL EXAM:  Unable to complete at this time. RD working remotely.  Diet Order:   Diet Order            Diet NPO time specified  Diet effective now              EDUCATION NEEDS:   No education needs have been identified at this time  Skin:  Skin Assessment: Skin Integrity Issues: Incisions: closed incisions to chest and right leg  Last BM:  no documented BM  Height:   Ht Readings from Last 1 Encounters:  03/11/2019 5\' 8"  (1.727 m)    Weight:   Wt Readings from Last 1 Encounters:  03/18/19 100.2 kg   EDW: 79.8  Ideal Body Weight:  63.6 kg  BMI:  Body mass index is 33.59 kg/m.  Estimated Nutritional Needs:   Kcal:  1660  Protein:  105-120 grams  Fluid:  per MD    Gaynell Face, MS, RD, LDN Inpatient Clinical Dietitian Pager: [phone] Weekend/After Hours: [phone]

## 2019-03-18 NOTE — Progress Notes (Addendum)
EEG official read for tracings prior to loading dose of VPA and initiation of Versed: This is an abnormal electroencephalogram secondary to rhythmic GPEDs at a frequency of 2/second.  Can not rule out subclinical seizure activity.    Patient re-evaluated at the bedside. After third Versed loading dose of 10 mg and infusion rate increase to 24 mg/hr, EEG is improved, but electrographic discharges not resolved.   -- Administering 4th loading dose of 10 mg IV Versed and increasing rate to 32 mg/hr.  -- Continue Keppra and valproic acid scheduled dosing  5 additional minutes of ICU time.  Electronically signed: Dr. Kerney Elbe

## 2019-03-18 NOTE — Op Note (Signed)
NAME: Madeline, [name] MEDICAL RECORD QI:[id number] ACCOUNT [id number] DATE OF BIRTH:1946-03-10 FACILITY: [facility] LOCATION: [facility] PHYSICIAN: VAN TRIGT III, MD  OPERATIVE REPORT  DATE OF PROCEDURE:  03/13/2019  OPERATION: 1.  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 2.  Placement of right femoral A line for blood pressure monitoring.  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  SURGEON:  Len Childs, MD  ASSISTANT:  Enid Cutter, PA-C  ANESTHESIA:  General by Dr. Jenny Reichmann _Gemeron___   CLINICAL NOTE:  The patient is a 73 year old female who has had chest pain for the past 1-2 weeks.  She has been seen in the emergency departments for evaluation more than once as well as a cardiology office.  Her stress test had been negative and her  cardiac enzymes have been negative.  She presented to the urgent care center on the evening of surgery with persistent and more severe chest pain and had a CT scan which showed a Stanford type A ascending aortic dissection with intramural hematoma of the  wall of the ascending aorta and aortic arch.  She was transferred to this hospital for emergency cardiac surgery.  Prior to surgery, I examined the patient in the preop holding area and reviewed the results of her CT scan.  The patient was familiar with aortic dissection since one of her parents had had it several years ago.  I discussed the expected benefits of  surgery including improved survival and the associated risks including stroke, bleeding, blood transfusion, postoperative infection, postoperative organ failure, and  death.  She demonstrated her understanding and agreed to proceed with surgery as  planned.  OPERATIVE FINDINGS: 1.  Evidence of a well-established type A aortic dissection involving the ascending aorta and arch with the origin at a penetrating ulcer at the distal ascending aorta extending into the lesser curvature of the arch.  There was well-organized thrombus in  parts of the dissection. 2.  No significant aortic insufficiency noted on intraoperative TEE. 3.  Severe coagulopathy with prolonged coagulation parameters requiring large volume transfusion and administration of clotting factors.  DESCRIPTION OF PROCEDURE:  The patient was brought directly from preop holding in the operating room and placed supine on the operating table.  General anesthesia was induced.  She remained stable.  A transesophageal echo showed the evidence of the type  A aortic dissection.  The patient was prepped and draped as a sterile field and a proper time-out was performed.  An incision was made after a femoral A line was placed in the right femoral artery for blood pressure monitoring.  The incision was placed  in the right axillary fold for exposure of the axillary artery for axillary arterial inflow cannulation for bypass.  The axillary artery was a good vessel.  It was encircled with vessel loops, and after heparin, an 8 mm graft was sewn end-to-side with  running 5-0 Prolene.  The graft was clamped and placed aside.  A sternotomy was then performed.  There was bloody pericardial effusion.  The ascending aorta had evidence of bleeding and hematoma.  It was adherent to the main pulmonary artery.  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  The right atrium and  cardiac chambers were pushed inferiorly and difficult to expose because of the enlarged ascending aorta with the hematoma.  The pericardium was opened and suspended.  Pursestrings were placed in the right atrium.  Heparin was administered for   cardiopulmonary bypass, and the axillary artery graft was connected to the arterial inflow circuit.  Attempt at cannula in the right atrium extending into the IVC was not successful due to the anatomic alteration of her cardiac structures from the large  aneurysm.  The pursestring was then tied off, and a 23-25 [place] venous cannula from the groin was placed under echo guidance into the right atrium to provide venous drainage for bypass.  The patient was then placed on cardiopulmonary bypass.  A left  ventricular vent was placed to decompress the left ventricle.  A pursestring in the right atrium was placed for a retrograde coronary sinus cardioplegia catheter.  The patient was slowly cooled down to an ultimate temperature of 22 degrees.  The aorta was dissected from the surrounding adherent mediastinal structures.  A vessel loop was placed around the innominate artery.  A cardioplegia cannula was placed into the coronary sinus for retrograde cardioplegic arrest.  The aorta was dissected  off the pulmonary artery, and there was some bleeding from the probable aortic penetrating ulcer located in that area.  At this point, the patient had been cooled to 32 degrees, and the aortic clamp was placed just beneath the innominate artery.  The patient was given cardioplegia 1.5 L through the coronary sinus, and topical ice was applied.  The aorta was transected  beneath the cross-clamp, encountering the large false lumen, which had been filled with intramural hematoma.  The aorta was dissected off the pulmonary artery down to the sinotubular junction where it was transected.  There was significant edema and  hemorrhage into the epicardial fat at the RV and LV outflow tract areas which made exposure of the aorta and the root difficult.  The aortic valve was inspected and found to be trileaflet and appeared to be intact.  The coronary sinuses were not involved with the dissection.  The aortic hematoma was  removed, and the wall of the aorta above the sinotubular junction was reconstructed with Teflon felt and BioGlue.  The diameter of this aorta was measured, and a 28 mm Hemashield platinum graft was selected and sewn end-to-end  with a running Prolene followed by interrupted pledgeted Prolene sutures for hemostasis.  LV remained vented, and the operative field was insufflated with carbon dioxide.  At this point, the patient had been cooled to 22 degrees, and preparations for circulatory arrest were prepared by the anesthesiologist.  The perfusionist emptied blood to the cardiotomy reservoir.  The patient was placed in steep Trendelenburg, and the  aortic cross-clamp was removed.  The aortic hematoma extended into the arch.  The aorta was transected just beneath the innominate artery, which had been clamped, and antegrade cerebral perfusion was started at 500 mm per hour monitored by cerebral oximeter.  The aorta was a double lumen, and the hematoma was removed.  It was apparent there was a penetrating ulcer at the distal ascending aorta which extended into the lesser curve of the arch.  The aorta was debrided, and a second graft of the 30 mm Hemashield  platinum woven graft was then used to construct a hemiarch repair with a beveled edge of the graft corresponding to the orientation of the inferior arch.  There was evidence of good antegrade perfusion with drainage  of the oxygenated blood from the pump  through the left carotid and left subclavian ostial openings.  This was controlled with a sump sucker.  The end-to-end anastomosis of the beveled graft to the hemiarch was constructed with a running Prolene, followed by interrupted pledgeted Prolene  sutures for hemostasis.  This was difficult because of the damaged quality of the aorta from the hematoma.  After the 2 grafts had been constructed, they were beveled and cut to the appropriate length and angulation, sewn together in an end-to-end  anastomosis  with running 4-0 Prolene.  A light layer of CoSeal was used for coverage of the suture lines.  The false lumen of the arch was also reapproximated with Teflon felt and BioGlue prior to the end-to-end anastomosis to the 30 mm graft.  The patient was then placed back on full body perfusion with a clamp and removed from the innominate artery.  There was sluggish filling of the aortic reconstruction and evidence of malperfusion from the axillary artery inflow into the aortic arch false lumen.   Because of evidence of malperfusion from axillary artery perfusion, we immediately placed an arterial cannula in the ascending  graft around pledgeted tourniquet.  A 20-French aortic cannula and the inflow arterial circuit from the pump were then directed for  antegrade flow, and this immediately restored normal flow to the ascending aorta and arch, pressurizing the true lumen  The patient was then rewarmed over an extensive period of time.  The platelet count had been checked and was low at 50,000.  Temporary pacing wires were placed, and the cardioplegia cannula and LV vent were removed.  The lungs were expanded, and the  ventilator was resumed.  The patient was started on low-dose inotropes.  After being properly rewarmed, was weaned from cardiopulmonary bypass.  Echocardiogram showed satisfactory biventricular function.  Protamine was administered, but there was still  significant bleeding from coagulopathy from needle holes in the suture lines as well as the dissection of the mediastinum.  This required FFP, platelets and some cryoprecipitate without improvement.  The patient received a dose of NovoSeven after  separation from cardiopulmonary bypass and decannulation, which improved the coagulopathy.  Additional pledgeted sutures were needed at the aortic repair for persistent bleeding.  After 2 hours of work to provide adequate hemostasis after separation from  cardiopulmonary bypass, the patient was felt adequate  for chest closure.  Bilateral pleural and mediastinal drains were placed.  The pericardium was closed over the aortic graft.  The sternum was closed with interrupted wire.  The patient remained  stable.  The pectoralis fascia, subcutaneous layer and skin were closed.  The axillary artery had been ligated and divided and that incision closed in a standard fashion.  The patient then returned from the operating room to the ICU in critical condition  but with stable hemodynamics on inotropic support.  Total cardiopulmonary bypass time was 400 minutes.  LN/NUANCE  D:03/17/2019 T:03/17/2019 JOB:[id number]

## 2019-03-18 NOTE — Progress Notes (Signed)
Patient re-evaluated at the bedside.Versed at 60 mg/hr. EEG with continued GPEDs, somewhat decreased in frequency but still notresolved.   --ContinueVersed gtt at60mg /hr. -- Continue Keppra and valproic acid scheduled dosing -- Given lack of continued improvement of the EEG with subsequent dose escalations after initial dose escalation of Versed, will need to start propofol gtt.  -- Discussed with Dr. Prescott Gum, who agrees with the plan. Precedex will need to be discontinued as propofol is started. RN aware.  -- Propofol loading dose is 1 mg/kg, followed by infusion rate of 2 mg/kg/hr (33 mcg/kg/min) -- Pressors PRN hypotension with propofol. SBP goal per Dr. Prescott Gum is > 100. -- Will discuss with patient's husband in the AM per request by Cardiothoracic Surgery team.   Electronically signed: Dr. Kerney Elbe

## 2019-03-18 NOTE — Progress Notes (Signed)
EEG Completed; Results Pending; notified Dr Cheral Marker.

## 2019-03-18 NOTE — Procedures (Signed)
ELECTROENCEPHALOGRAM REPORT   Patient: Madeline [name]       Room #: 2Y81Y EEG No. ID: [id number] Age: 73 y.o.        Sex: female Referring Physician: Prescott Gum Report Date:  03/18/2019        Interpreting Physician: Alexis Goodell  History: [name] is an 73 y.o. female with aortic dissection  Medications:  ASA, Rocephin, Insulin, Keppra, Zaroxolyn, Versed, Depacon, Precedex, Adrenalin, Lasix, Levophed, Primacor  Conditions of Recording:  This is a 21 channel routine scalp EEG performed with bipolar and monopolar montages arranged in accordance to the international 10/20 system of electrode placement. One channel was dedicated to EKG recording.  The patient is in the intubated and sedated state.  Description:  The background activity is discontinuous.  There are periods of attenuation alternating with generalized bursts of high voltage polyspike activity.   This polyspike activity occurs at a frequency of 2/second.  This discontinuous activity persists throughout the recording.   Hyperventilation and intermittent photic stimulation were not performed.  IMPRESSION: This is an abnormal electroencephalogram secondary to rhythmic GPEDs at a frequency of 2/second.  Can not rule out subclinical seizure activity.     Alexis Goodell, MD Neurology [phone] 03/18/2019, 11:04 AM

## 2019-03-18 NOTE — Progress Notes (Signed)
EVENING ROUNDS NOTE :     Madeline [name].[address]       [place], [address]             [phone]                 3 Days Post-Op Procedure(s) (LRB): REPLACEMENT OF ASCENDING AORTA USING HEMASHIELD PLATINUM 28MM and 30MM VASCULAR GRAFT (N/A)  Total Length of Stay:  LOS: 3 days  BP (!) 78/43   Pulse 87   Temp (!) 97.2 F (36.2 C)   Resp 15   Ht 5\' 8"  (1.727 m)   Wt 100.2 kg   SpO2 96%   BMI 33.59 kg/m   .Intake/Output      06/28 0701 - 06/29 0700 06/29 0701 - 06/30 0700   I.V. (mL/kg) 2332 (23.3) 1754.5 (17.5)   Blood     Other 240    NG/GT 160    IV Piggyback 1352.6 263.3   Total Intake(mL/kg) 4084.5 (40.8) 2017.8 (20.1)   Urine (mL/kg/hr) 320 (0.1) 185 (0.2)   Emesis/NG output 0    Chest Tube 280 170   Total Output 600 355   Net +3484.5 +1662.8          . sodium chloride 10 mL/hr at 03/18/19 1500  . sodium chloride    . sodium chloride 10 mL/hr at 03/18/19 1600  . amiodarone 60 mg/hr (03/18/19 1600)  . cefTRIAXone (ROCEPHIN)  IV Stopped (03/18/19 1017)  . dexmedetomidine (PRECEDEX) IV infusion 0.7 mcg/kg/hr (03/18/19 1500)  . epinephrine 3 mcg/min (03/18/19 1500)  . furosemide (LASIX) infusion 10 mg/hr (03/18/19 1500)  . lactated ringers 10 mL/hr at 03/18/19 1500  . lactated ringers Stopped (03/16/19 1646)  . levETIRAcetam Stopped (03/18/19 1040)  . midazolam (VERSED) infusion 60 mg/hr (03/18/19 1803)  . milrinone 0.25 mcg/kg/min (03/18/19 1500)  . norepinephrine (LEVOPHED) Adult infusion 6 mcg/min (03/18/19 1500)  . valproate sodium 500 mg (03/18/19 1754)     Lab Results  Component Value Date   WBC 16.6 (H) 03/18/2019   HGB 11.9 (L) 03/18/2019   HCT 35.0 (L) 03/18/2019   PLT 28 (LL) 03/18/2019   GLUCOSE 153 (H) 03/18/2019   CHOL 189 02/22/2018   TRIG 120.0 02/22/2018   HDL 64.20 02/22/2018   LDLCALC 101 (H) 02/22/2018   ALT 51 (H) 03/18/2019   AST 104 (H) 03/18/2019   NA 143 03/18/2019   K 3.8 03/18/2019   CL 110 03/18/2019   CREATININE 3.12 (H) 03/18/2019   BUN 31 (H) 03/18/2019   CO2 24 03/18/2019   TSH 2.77 02/22/2018   INR 1.4 (H) 03/16/2019   HGBA1C 5.7 02/22/2018   Sedated on vent Acute renal failure EEG- seizure activity getting versed for rx     Grace Isaac MD  Beeper [phone] Office [phone] 03/18/2019 6:07 PM

## 2019-03-18 NOTE — Telephone Encounter (Signed)
Appt w/ Melissa.

## 2019-03-18 NOTE — Progress Notes (Signed)
Patient re-evaluated at the bedside.AfterfifthVersed loading dose of 10 mg and infusion rateincrease to40mg /hr, EEG was improved,but electrographicdischarges notresolved. An additional bolus of Versed 10 mg was administered with rate increased to 50 mg/hr. Reassessement of EEG revealed continued GPEDs.   --Administering10 mgIVVersed and increasing rate to 60mg /hr. -- Continue Keppra and valproic acid scheduled dosing -- Given lack of continued improvement of the EEG with subsequent dose escalations after initial dose escalation of Versed, may need to initiate propofol if next EEG check does not show resolution of GPEDs  10 minutes of ICU time.   Electronically signed: Dr. Kerney Elbe

## 2019-03-18 NOTE — Progress Notes (Signed)
CRITICAL VALUE ALERT  Critical Value: platelets 28; minimal CT output; other labs results given to MD, ABG and COOX read to MD as well. Minimal urine output overnight. MD updated on overall pt's conditon  Date & Time Notied:  03/18/2019  Provider Notified:Dr Lucianne Lei trigt  Orders Received/Actions taken: give potassium IV X1. No other orders received. Will continue no monitor pt closely.

## 2019-03-18 NOTE — Progress Notes (Signed)
Loading dose 10mg  Versed given and drip started at 8mg /hr.

## 2019-03-18 NOTE — Progress Notes (Signed)
3 Days Post-Op Procedure(s) (LRB): REPLACEMENT OF ASCENDING AORTA USING HEMASHIELD PLATINUM 28MM and 30MM VASCULAR GRAFT (N/A) Subjective: Patient sedated on ventilator Patient had some facial twitching and EEG was ordered. This showed seizure activity. Neurology, Dr. Cheral Marker, he is now actively treating the patient for seizure disorder. Her hemodynamics have been stable and have tolerated high-dose Versed Pulmonary function somewhat improved and FiO2 has been weaned down to 80% Urine output remains at 20 cc/h with creatinine now 3.1 Minimal chest tube output  Objective: Vital signs in last 24 hours: Temp:  [97.2 F (36.2 C)-98.8 F (37.1 C)] 97.3 F (36.3 C) (06/29 1800) Pulse Rate:  [83-155] 111 (06/29 1800) Cardiac Rhythm: Normal sinus rhythm;Sinus tachycardia;Atrial fibrillation (06/29 0800) Resp:  [14-26] 16 (06/29 1800) BP: (67-147)/(37-75) 88/66 (06/29 1800) SpO2:  [91 %-98 %] 97 % (06/29 1800) Arterial Line BP: (77-160)/(39-78) 115/64 (06/29 1800) FiO2 (%):  [80 %-100 %] 80 % (06/29 1600) Weight:  [100.2 kg] 100.2 kg (06/29 0230)  Hemodynamic parameters for last 24 hours: PAP: (21-37)/(14-21) 24/19 CVP:  [8 mmHg-21 mmHg] 13 mmHg CO:  [5.7 L/min-5.9 L/min] 5.9 L/min CI:  [2.9 L/min/m2-3 L/min/m2] 3 L/min/m2  Intake/Output from previous day: 06/28 0701 - 06/29 0700 In: 4084.5 [I.V.:2332; NG/GT:160; IV Piggyback:1352.6] Out: 600 [Urine:320; Chest Tube:280] Intake/Output this shift: Total I/O In: 2145.6 [I.V.:1882.2; IV Piggyback:263.3] Out: 375 [Urine:205; Chest Tube:170]  Exam Sedated on vent Pupils approximately 3 mm and react to light No response to stimulus Lungs with scattered rhonchi Sinus tachycardia without murmur Abdomen soft Extremities edematous but warm  Lab Results: Recent Labs    03/17/19 1852  03/18/19 0419 03/18/19 0424 03/18/19 1646  WBC 19.2*  --  16.6*  --   --   HGB 14.3   < > 12.7 11.9* 11.9*  HCT 40.4   < > 36.2 35.0* 35.0*  PLT  33*  --  28*  --   --    < > = values in this interval not displayed.   BMET:  Recent Labs    03/17/19 1852  03/18/19 0419 03/18/19 0424 03/18/19 1646  NA 146*   < > 145 145 143  K 3.9   < > 3.6 3.5 3.8  CL 109  --  110  --   --   CO2 23  --  24  --   --   GLUCOSE 211*  --  153*  --   --   BUN 26*  --  31*  --   --   CREATININE 2.58*  --  3.12*  --   --   CALCIUM 8.8*  --  8.5*  --   --    < > = values in this interval not displayed.    PT/INR:  Recent Labs    03/16/19 1321  LABPROT 17.3*  INR 1.4*   ABG    Component Value Date/Time   PHART 7.411 03/18/2019 1646   HCO3 22.9 03/18/2019 1646   TCO2 24 03/18/2019 1646   ACIDBASEDEF 2.0 03/18/2019 1646   O2SAT 78.6 03/18/2019 1650   CBG (last 3)  Recent Labs    03/18/19 0743 03/18/19 1215 03/18/19 1539  GLUCAP 138* 117* 111*    Assessment/Plan: S/P Procedure(s) (LRB): REPLACEMENT OF ASCENDING AORTA USING HEMASHIELD PLATINUM 28MM and 30MM VASCULAR GRAFT (N/A) Postoperative seizure disorder Postoperative acute renal insufficiency Postoperative acute respiratory insufficiency, improving Postop atrial fibrillation, controlled with amiodarone I have discussed the patient's condition with her daughter and husband and updated them  on the plan of care.  They would appreciate speaking with the neurologist and are concerned about her prognosis from perioperative stroke   Tharon Aquas Trigt III 03/18/2019

## 2019-03-18 NOTE — Progress Notes (Signed)
Spoke with primary RN about PICC placement and per RN Dr. Prescott Gum would like [place] to hold off placement of PICC Line at this time. We will reassess in am.

## 2019-03-18 NOTE — Progress Notes (Signed)
Patient re-evaluated at the bedside. AfterfourthVersed loading dose of 10 mg and infusion rateincrease to 32mg /hr, EEG is improved,but electrographic discharges not resolved. Appearance now most consistent with GPEDs.   -- Administering5thloading dose of 10 mg IV Versed and increasing rate to 40mg /hr.  -- Continue Keppra and valproic acid scheduled dosing  5additional minutes of ICU time.

## 2019-03-18 NOTE — Consult Note (Addendum)
NEURO HOSPITALIST CONSULT NOTE   Requestig physician: Dr. Prescott Gum  Reason for Consult: Status epilepticus  History obtained from:  Chart     HPI:                                                                                                                                          Madeline [name] is an 73 y.o. female with HTN and a family history of aortic dissection who presented to [facility] with persistent epigastric penetrating chest pain for the past few weeks. 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 She underwent emergent cardiothoracic surgical procedure with replacement of ascending aorta using a Hemashield vascular graft. 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 On follow up exam 6/28 while patient was sedated on Precedex, facial twitching was noted. The patient was started on Keppra 1000 mg BID at 10 PM on Sunday night. Twitching continued and EEG was ordered for the AM, which showed electrographic status epilepticus. Neurology was called to the bedside STAT to evaluate.   Most recent CT head available in Epic from 2009 revealed no abnormality.   There is no history of seizures listed in Epic. She is not on an anticonvulsant.   Past Medical History:  Diagnosis Date  . Anxiety   . Arthritis 09-14-11   osteoarthritis, osteopenia  . Cataracts, both eyes 09-13-11   not surgical yet  . Cholelithiasis 05/17/2016  . Chronic neck pain 05/29/2016  . Cystitis 2007   sepsis post bladder biopsy, Dr Jeffie Pollock  . Fibromyalgia 09-14-11   Dr Donney Dice, [facility]  . Fractures 09/14/2011   toes-left foot  . GERD (gastroesophageal reflux disease) 09-14-11   tx. Nexium  . H/O foot surgery 12/19/2011  . Hiatal hernia 05/17/2016  . Hyperglycemia 08/23/2016  . Hyperlipidemia   . Hypertension 09-13-11   tx. meds  . 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 Left ventricular hypertrophy 05/15/2017  . Muscle cramping 10/23/2017  . TMJ click 44/11/4740   right side > left  . 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 ABDOMINAL HYSTERECTOMY  1977   dysfunctional menses; age 37  . APPENDECTOMY  1963  . COLONOSCOPY  2011   negative  . CYSTOSTOMY W/ BLADDER BIOPSY  04-2006  . FOOT ARTHRODESIS  01/03/12   [facility]-- left hallux MP joint arthrodesis  . HAMMER TOE SURGERY  01/03/12   [facility], Dr Doran Durand  . NASAL SINUS SURGERY  09-14-11   right side " sinus lift"  . REPLACEMENT ASCENDING AORTA N/A 03/08/2019  Procedure: REPLACEMENT OF ASCENDING AORTA USING HEMASHIELD PLATINUM 28MM and 30MM VASCULAR GRAFT;  Surgeon: Ivin Poot, MD;  Location: [facility];  Service: Open Heart Surgery;  Laterality: N/A;  . SIGMOIDOSCOPY    . sling procedure  2006   a and p repair  . TONSILLECTOMY    . TOTAL KNEE ARTHROPLASTY  09/21/2011/B[facility]   Procedure: TOTAL KNEE ARTHROPLASTY;  Surgeon: Gearlean Alf;  Location: [facility];  Service: Orthopedics;  Laterality: Left;  . TOTAL KNEE ARTHROPLASTY  06/25/2012   Procedure: TOTAL KNEE ARTHROPLASTY;  Surgeon: Gearlean Alf, MD;  Location: [facility];  Service: Orthopedics;  Laterality: Right;    Family History  Problem Relation Age of Onset  . Aortic aneurysm Father        femoral aneurysm  . Aortic dissection Father   . Mental illness Brother        paranoid schizophrenia  . Pneumonia Brother        pneumothorax in [place]  . Heart attack Other        both GF;PGM; M uncle  . Stroke Paternal Aunt        aneurysm  . Colon cancer Paternal Aunt        X 2  . Other Mother        lung tumor which resolved w/o treatment  . Hypertension Mother   . Other Maternal Grandmother        enalraged heart  . Hypertension Maternal Grandmother   . Heart attack Maternal Grandfather        In fifties, died.   . Hypertension Maternal Grandfather   . Heart attack Paternal Grandmother        In forties.  . Hypertension Paternal Grandmother   . Heart attack Paternal Grandfather   . Heart disease Paternal Grandfather   . Hypertension Paternal Grandfather   . Hypertension Daughter   . Supraventricular tachycardia Daughter   . Hypertension Son   . AAA (abdominal aortic aneurysm) Son   . Diabetes Neg Hx               Social History:  reports that she has never smoked. She has never used smokeless tobacco. She reports previous alcohol use. She reports that she does not use drugs.  Allergies  Allergen Reactions  . Ivp Dye [Iodinated Diagnostic Agents]     Rash , hives  . Codeine Itching    Unless something given to counteract itching    MEDICATIONS:                                                                                                                     Scheduled: . sodium chloride   Intravenous Once  . acetaminophen (TYLENOL) oral liquid 160 mg/5 mL  1,000 mg Per Tube Q6H  . aspirin EC  325 mg Oral Daily   Or  . aspirin  324 mg Per Tube Daily  . bisacodyl  10 mg Oral Daily   Or  . bisacodyl  10 mg Rectal Daily  . chlorhexidine gluconate (MEDLINE KIT)  15 mL Mouth Rinse BID  . Chlorhexidine Gluconate Cloth  6 each Topical Q0600  . docusate sodium  200 mg Oral Daily  . insulin aspart  0-24 Units Subcutaneous Q4H  . levalbuterol  0.63 mg Nebulization Q6H  . mouth rinse  15 mL Mouth Rinse 10 times per day  . metolazone  10 mg Oral Daily  . mupirocin cream   Topical BID  . pantoprazole (PROTONIX) IV  40 mg Intravenous Q24H  . sodium chloride flush  3 mL Intravenous Q12H   Continuous: . sodium chloride 10 mL/hr at 03/18/19 0900  . sodium chloride    . sodium chloride 10 mL/hr at 03/17/19 0955  . amiodarone    . amiodarone    . cefTRIAXone (ROCEPHIN)  IV 2 g (03/18/19 0946)  . dexmedetomidine (PRECEDEX) IV infusion 0.7 mcg/kg/hr (03/18/19 0900)  . epinephrine 3 mcg/min  (03/18/19 0900)  . furosemide (LASIX) infusion 10 mg/hr (03/18/19 0900)  . lactated ringers 10 mL/hr at 03/18/19 0947  . lactated ringers Stopped (03/16/19 1646)  . levETIRAcetam Stopped (03/17/19 2231)  . milrinone 0.25 mcg/kg/min (03/18/19 0900)  . norepinephrine (LEVOPHED) Adult infusion 3 mcg/min (03/18/19 0900)  . valproate sodium    . valproate sodium       ROS:                                                                                                                                       Unable to obtain due to unresponsiveness.    Blood pressure 97/60, pulse 97, temperature 98.1 F (36.7 C), resp. rate (!) 24, height '5\' 8"'$  (1.727 m), weight 100.2 kg, SpO2 95 %.   General Examination:                                                                                                       Physical Exam  HEENT-  [place]/AT    Cardiovascular- Dressing in place to chest  Lungs- Intubated Extremities- Diffuse edema noted   Neurological Examination Mental Status: Sedated on Precedex. No eye opening, purposeful limb movement or posturing to any stimulus. Does not respond to voice. No attempts to communicate.  Cranial Nerves: II: Pupils 1.5 mm and unreactive bilaterally. Does not avert gaze towards or away from visual stimuli.  III,IV, VI: Eyes disconjugate. No doll's eye reflex V,VII: Face flaccidly symmetric.  VIII: No response to voice IX,X: Intubated XI: Unable to assess  XII: Intubated - unable to assess Motor/Sensory: Flaccid upper and lower extremities. No asymmetry. No movement of upper extremities to noxious. Only movement of BLE to noxious is briskly upgoing toes.  Deep Tendon Reflexes: Low amplitude brisk reflexes to brachioradialis bilaterally. Toes briskly upgoing bilaterally  Cerebellar/Gait: Unable to assess Other: Lower facial twitching noted intermittently.    Lab Results: Basic Metabolic Panel: Recent Labs  Lab 03/16/19 1105  03/16/19 1718 03/16/19 1729   03/17/19 0200  03/17/19 1046 03/17/19 1302 03/17/19 1852 03/17/19 2357 03/18/19 0419 03/18/19 0424  NA 147*   < > 151* 149*   < > 145   < > 149* 147* 146* 146* 145 145  K 4.4   < > 3.5 3.4*   < > 3.9   < > 3.7 3.7 3.9 3.5 3.6 3.5  CL 114*  --  113* 110  --  113*  --   --   --  109  --  110  --   CO2 20*  --  23  --   --  23  --   --   --  23  --  24  --   GLUCOSE 192*   < > 136* 132*  --  123*  --  118*  --  211*  --  153*  --   BUN 10  --  13 15  --  18  --   --   --  26*  --  31*  --   CREATININE 0.95  --  1.43* 1.10*  --  1.88*  --   --   --  2.58*  --  3.12*  --   CALCIUM 10.5*  --  10.3  --   --  9.6  --   --   --  8.8*  --  8.5*  --   MG 1.8  --  3.4*  --   --  2.7*  --   --   --  2.6*  --   --   --    < > = values in this interval not displayed.    CBC: Recent Labs  Lab 03/02/2019 1200  03/16/19 1105  03/16/19 1718  03/17/19 0200  03/17/19 1302 03/17/19 1852 03/17/19 2357 03/18/19 0419 03/18/19 0424  WBC 11.0*  --  16.8*  --  19.1*  --  20.9*  --   --  19.2*  --  16.6*  --   NEUTROABS 8.4*  --   --   --   --   --   --   --   --   --   --   --   --   HGB 11.8*   < > 19.1*   < > 19.6*   < > 18.4*   < > 13.9 14.3 12.2 12.7 11.9*  HCT 36.7   < > 57.1*   < > 55.3*   < > 51.2*   < > 41.0 40.4 36.0 36.2 35.0*  MCV 89.5  --  87.6  --  84.0  --  81.8  --   --  82.6  --  83.2  --   PLT 414*   < > 55*  --  61*  --  60*  --   --  33*  --  28*  --    < > = values in this interval not displayed.    Cardiac Enzymes: No results for input(s): CKTOTAL, CKMB, CKMBINDEX, TROPONINI in the last  168 hours.  Lipid Panel: No results for input(s): CHOL, TRIG, HDL, CHOLHDL, VLDL, LDLCALC in the last 168 hours.  Imaging: Dg Chest 1 View  Result Date: 03/16/2019 CLINICAL DATA:  Status post median sternotomy.  Hypoxia. EXAM: CHEST  1 VIEW COMPARISON:  March 12, 2019 chest radiograph and March 15, 2019 chest CT FINDINGS: Endotracheal tube tip is 6.2 cm above the carina. Swan-Ganz catheter tip  is in the main pulmonary outflow tract. There is a mediastinal drain as well as bilateral chest tubes. No evident pneumothorax. There is a left pleural effusion with left base atelectasis. There is also atelectatic change in the right mid and lower lung zones. There is trace interstitial edema. There is borderline cardiac enlargement. The aorta is somewhat ill-defined which is likely due to edema secondary to the recent surgery. No bone lesions are appreciable. There are skin staples overlying the right axillary region. IMPRESSION: Tube and catheter positions as described without evident pneumothorax. Left pleural effusion with left base atelectasis. Mild edema with patchy atelectasis on the right. No frank consolidation. Aortic contour less than optimally visualized, likely due to edema from recent surgery. Heart mildly prominent. Electronically Signed   By: Lowella Grip III M.D.   On: 03/16/2019 11:06   Dg Chest Port 1 View  Result Date: 03/18/2019 CLINICAL DATA:  Intubation.  Chest tube. EXAM: PORTABLE CHEST 1 VIEW COMPARISON:  03/17/2019. FINDINGS: Endotracheal tube, NG tube, mediastinal drainage tube, Swan-Ganz catheter, bilateral chest tubes in stable position. No pneumothorax. Prior CABG. Stable cardiomegaly. Progressive bibasilar atelectasis/infiltrates, particularly on the left. Consolidation left lower lobe noted. Small left pleural effusion noted on today's exam. IMPRESSION: 1. Lines and tubes including bilateral chest tubes in stable position. No pneumothorax. 2. Progressive bibasilar atelectasis and infiltrates particularly prominent in the left base. Consolidation left lower lobe is noted. Small left pleural effusion noted on today's exam. 3.  Prior CABG.  Stable cardiomegaly. Electronically Signed   By: Marcello Moores  Register   On: 03/18/2019 06:56   Dg Chest Port 1 View  Result Date: 03/17/2019 CLINICAL DATA:  73 year old female with hypoxia. EXAM: PORTABLE CHEST 1 VIEW COMPARISON:  Chest  radiograph dated 03/16/2019 FINDINGS: Similar positioning of support lines and tubes. Slight interval improvement of pulmonary edema since the earlier radiograph. Relatively similar appearance of left lung base and right perihilar densities. IMPRESSION: Slight interval improvement of pulmonary edema since the earlier radiograph. Electronically Signed   By: Anner Crete M.D.   On: 03/17/2019 01:09   Dg Chest Port 1 View  Result Date: 03/16/2019 CLINICAL DATA:  Pneumothorax EXAM: PORTABLE CHEST 1 VIEW COMPARISON:  Portable exam 1431 hours compared to 1119 hours FINDINGS: Tip of endotracheal tube projects 1.7 cm above carina. Nasogastric tube extends into stomach. RIGHT jugular Swan-Ganz catheter with tip over proximal RIGHT pulmonary artery. Mediastinal drain and BILATERAL thoracostomy tubes present. Epicardial pacing wires present. Enlargement of cardiac silhouette with vascular congestion. Improved perihilar edema. Persistent LEFT pleural effusion and bibasilar atelectasis. No pneumothorax. IMPRESSION: Improved pulmonary edema with persistent LEFT pleural effusion and bibasilar atelectasis. Line and tube positions as above. Electronically Signed   By: Lavonia Dana M.D.   On: 03/16/2019 14:56   [place] Ekg Site Rite  Result Date: 03/17/2019 If Site Rite image not attached, placement could not be confirmed due to current cardiac rhythm.   Assessment: 73 year old female in status epilepticus 1. Was started on Keppra 1000 mg IV BID this weekend 2. EEG shows electrographic status epilepticus with abnormal discharges  in all leads. Exam reveals subtle twitching.   3. 2 mg IV Ativan STAT x 2 without effect on the EEG 4. BP is soft at 97/60 5. DDx for new onset seizure with status epilepticus is broad, including toxic/metabolic/infectious and perioperative stroke. Possible EtOH or benzodiazepine withdrawal should also be considered.   Recommendations: 1. Converting spot EEG to LTM EEG 2. Valproic acid STAT  IV load 2000 mg (20 mg/kg) 3. Continue valproic acid at 5 mg/kg TID IV 4. Continue Keppra at 1000 mg IV BID 5. Have discussed starting Versed gtt with the patient's surgical team. Dr. Prescott Gum has cleared this. She has pressors which can be used if BP drops further. Will bolus with 0.2 mg/kg followed by a rate of 0.1 mg/kg/hr followed by re-evaluation of EEG.   80 minutes spent in the emergent neurological evaluation and management of this critically ill patient.   Electronically signed: Dr. Kerney Elbe 03/18/2019, 9:45 AM

## 2019-03-18 NOTE — Progress Notes (Signed)
After second Versed loading dose of 10 mg and infusion rate increase to 16 mg/hr, EEG is improved, but electrographic seizure activity not resolved. Her valproic acid load has been completed. She has received her 10 AM dose of Keppra.   Administering third 10 mg load of Versed and increasing rate to 24 mg/hr.   5 additional minutes of ICU time.   Electronically signed: Dr. Kerney Elbe

## 2019-03-18 NOTE — Progress Notes (Signed)
Dr. Prescott Gum made aware of facial twitching.  Order received for EEG

## 2019-03-18 NOTE — Progress Notes (Signed)
LTM set up by [place] - Neuro notified of LTM EEG running.

## 2019-03-18 NOTE — Progress Notes (Signed)
[facility] DAILY ICU PROGRESS NOTE                   [address]            [place] [address]          [phone]   3 Days Post-Op Procedure(s) (LRB): REPLACEMENT OF ASCENDING AORTA USING HEMASHIELD PLATINUM 28MM and 30MM VASCULAR GRAFT (N/A)  Total Length of Stay:  LOS: 3 days   Subjective:  Remains sedated on vent... Having twitching around her mouth.  Nursing states she believes she suffered a full seizure earlier this morning.  Objective: Vital signs in last 24 hours: Temp:  [98.2 F (36.8 C)-99.9 F (37.7 C)] 98.2 F (36.8 C) (06/29 0600) Pulse Rate:  [65-181] 100 (06/29 0600) Cardiac Rhythm: Normal sinus rhythm (06/29 0600) Resp:  [16-39] 21 (06/29 0600) BP: (73-127)/(37-91) 96/61 (06/29 0600) SpO2:  [91 %-98 %] 96 % (06/29 0600) Arterial Line BP: (73-184)/(39-92) 126/60 (06/29 0600) FiO2 (%):  [100 %] 100 % (06/29 0350) Weight:  [100.2 kg] 100.2 kg (06/29 0230)  Filed Weights   03/19/2019 1119 03/17/19 0530 03/18/19 0230  Weight: 79.8 kg 97.8 kg 100.2 kg    Weight change: 2.4 kg   Hemodynamic parameters for last 24 hours: PAP: (19-31)/(12-21) 27/16 CVP:  [9 mmHg-19 mmHg] 15 mmHg CO:  [2.8 L/min-5.9 L/min] 5.9 L/min CI:  [1.5 L/min/m2-3 L/min/m2] 3 L/min/m2  Intake/Output from previous day: 06/28 0701 - 06/29 0700 In: 3916.1 [I.V.:2179; NG/GT:160; IV Piggyback:1337.2] Out: 600 [Urine:320; Chest Tube:280]  Current Meds: Scheduled Meds: . sodium chloride   Intravenous Once  . acetaminophen (TYLENOL) oral liquid 160 mg/5 mL  1,000 mg Per Tube Q6H  . aspirin EC  325 mg Oral Daily   Or  . aspirin  324 mg Per Tube Daily  . bisacodyl  10 mg Oral Daily   Or  . bisacodyl  10 mg Rectal Daily  . chlorhexidine gluconate (MEDLINE KIT)  15 mL Mouth Rinse BID  . Chlorhexidine Gluconate Cloth  6 each Topical Q0600  . docusate sodium  200 mg Oral Daily  . insulin aspart  0-24 Units Subcutaneous Q4H  . levalbuterol  0.63 mg Nebulization Q6H  . mouth  rinse  15 mL Mouth Rinse 10 times per day  . pantoprazole (PROTONIX) IV  40 mg Intravenous Q24H  . sodium chloride flush  3 mL Intravenous Q12H   Continuous Infusions: . sodium chloride Stopped (03/18/19 0518)  . sodium chloride    . sodium chloride 10 mL/hr at 03/17/19 0955  . amiodarone 60 mg/hr (03/18/19 0600)  . cefTRIAXone (ROCEPHIN)  IV Stopped (03/17/19 1210)  . dexmedetomidine (PRECEDEX) IV infusion 0.7 mcg/kg/hr (03/18/19 0600)  . epinephrine 3 mcg/min (03/18/19 0600)  . furosemide (LASIX) infusion 10 mg/hr (03/18/19 0600)  . lactated ringers    . lactated ringers Stopped (03/16/19 1646)  . levETIRAcetam Stopped (03/17/19 2231)  . milrinone 0.25 mcg/kg/min (03/18/19 0600)  . norepinephrine (LEVOPHED) Adult infusion 4 mcg/min (03/18/19 0600)   PRN Meds:.sodium chloride, cisatracurium (NIMBEX) injection, metoprolol tartrate, midazolam, morphine injection, ondansetron (ZOFRAN) IV, oxyCODONE, sodium chloride flush, traMADol  General appearance: sedated on vent Heart: regular rate and rhythm Lungs: diminished breath sounds bibasilar Abdomen: soft, non-tender; bowel sounds normal; no masses,  no organomegaly Extremities: edema 1-2+ pitting edema Wound: aquacel in place  Lab Results: CBC: Recent Labs    03/17/19 1852  03/18/19 0419 03/18/19 0424  WBC 19.2*  --  16.6*  --  HGB 14.3   < > 12.7 11.9*  HCT 40.4   < > 36.2 35.0*  PLT 33*  --  28*  --    < > = values in this interval not displayed.   BMET:  Recent Labs    03/17/19 1852  03/18/19 0419 03/18/19 0424  NA 146*   < > 145 145  K 3.9   < > 3.6 3.5  CL 109  --  110  --   CO2 23  --  24  --   GLUCOSE 211*  --  153*  --   BUN 26*  --  31*  --   CREATININE 2.58*  --  3.12*  --   CALCIUM 8.8*  --  8.5*  --    < > = values in this interval not displayed.    CMET: Lab Results  Component Value Date   WBC 16.6 (H) 03/18/2019   HGB 11.9 (L) 03/18/2019   HCT 35.0 (L) 03/18/2019   PLT 28 (LL) 03/18/2019    GLUCOSE 153 (H) 03/18/2019   CHOL 189 02/22/2018   TRIG 120.0 02/22/2018   HDL 64.20 02/22/2018   LDLCALC 101 (H) 02/22/2018   ALT 51 (H) 03/18/2019   AST 104 (H) 03/18/2019   NA 145 03/18/2019   K 3.5 03/18/2019   CL 110 03/18/2019   CREATININE 3.12 (H) 03/18/2019   BUN 31 (H) 03/18/2019   CO2 24 03/18/2019   TSH 2.77 02/22/2018   INR 1.4 (H) 03/16/2019   HGBA1C 5.7 02/22/2018      PT/INR:  Recent Labs    03/16/19 1321  LABPROT 17.3*  INR 1.4*   Radiology: Dg Chest Port 1 View  Result Date: 03/18/2019 CLINICAL DATA:  Intubation.  Chest tube. EXAM: PORTABLE CHEST 1 VIEW COMPARISON:  03/17/2019. FINDINGS: Endotracheal tube, NG tube, mediastinal drainage tube, Swan-Ganz catheter, bilateral chest tubes in stable position. No pneumothorax. Prior CABG. Stable cardiomegaly. Progressive bibasilar atelectasis/infiltrates, particularly on the left. Consolidation left lower lobe noted. Small left pleural effusion noted on today's exam. IMPRESSION: 1. Lines and tubes including bilateral chest tubes in stable position. No pneumothorax. 2. Progressive bibasilar atelectasis and infiltrates particularly prominent in the left base. Consolidation left lower lobe is noted. Small left pleural effusion noted on today's exam. 3.  Prior CABG.  Stable cardiomegaly. Electronically Signed   By: Marcello Moores  Register   On: 03/18/2019 06:56   [place] Ekg Site Rite  Result Date: 03/17/2019 If Site Rite image not attached, placement could not be confirmed due to current cardiac rhythm.    Assessment/Plan: S/P Procedure(s) (LRB): REPLACEMENT OF ASCENDING AORTA USING HEMASHIELD PLATINUM 28MM and 30MM VASCULAR GRAFT (N/A)  1. CV- NSR, on Amiodarone, Epi, Milrinone, Levophed drips- wean as able 2. Pulm- sedated on vent, wean as tolerated, CXR shows left pleural effusion, consolidation of LLL 3. Renal- AKI- creatinine up to 3.12, + volume overload, K is 3.6.Marland Kitchen may benefit from diuretics, U/O was 320 yesterday,   possibly nephrology consult if creatinine continues to increase with decrease urinary output 4. Expected post operative blood loss anemia, Hgb stable at 11.9 5. Thrombocytopenia- 28, no active signs of bleeding, not currently on lovenox 6. Neuro- facial twitching, per nursing this is worse at times, with pupillary reaction on IV Keppra, and Nimbex... may need Neurology consult 7. Dispo- patient remains critically ill, on multiple drips, creatinine up to 3.16 with decreased U/O, Plt count is 28 monitor, Hgb stable    Zula Hovsepian 03/18/2019 7:55  AM

## 2019-03-19 ENCOUNTER — Inpatient Hospital Stay (HOSPITAL_COMMUNITY): Payer: Medicare Other

## 2019-03-19 LAB — TYPE AND SCREEN
ABO/RH(D): A POS
Antibody Screen: NEGATIVE
Unit division: 0
Unit division: 0
Unit division: 0
Unit division: 0
Unit division: 0
Unit division: 0
Unit division: 0
Unit division: 0
Unit division: 0
Unit division: 0
Unit division: 0
Unit division: 0
Unit division: 0
Unit division: 0
Unit division: 0
Unit division: 0
Unit division: 0
Unit division: 0
Unit division: 0
Unit division: 0
Unit division: 0
Unit division: 0
Unit division: 0
Unit division: 0

## 2019-03-19 LAB — BASIC METABOLIC PANEL
Anion gap: 14 (ref 5–15)
BUN: 45 mg/dL — ABNORMAL HIGH (ref 8–23)
CO2: 22 mmol/L (ref 22–32)
Calcium: 7.5 mg/dL — ABNORMAL LOW (ref 8.9–10.3)
Chloride: 106 mmol/L (ref 98–111)
Creatinine, Ser: 3.91 mg/dL — ABNORMAL HIGH (ref 0.44–1.00)
GFR calc Af Amer: 12 mL/min — ABNORMAL LOW (ref >60)
GFR calc non Af Amer: 11 mL/min — ABNORMAL LOW (ref >60)
Glucose, Bld: 121 mg/dL — ABNORMAL HIGH (ref 70–99)
Potassium: 4 mmol/L (ref 3.5–5.1)
Sodium: 142 mmol/L (ref 135–145)

## 2019-03-19 LAB — POCT I-STAT 7, (LYTES, BLD GAS, ICA,H+H)
Acid-base deficit: 4 mmol/L — ABNORMAL HIGH (ref 0.0–2.0)
Acid-base deficit: 6 mmol/L — ABNORMAL HIGH (ref 0.0–2.0)
Acid-base deficit: 5 mmol/L — ABNORMAL HIGH (ref 0.0–2.0)
Bicarbonate: 21.5 mmol/L (ref 20.0–28.0)
Bicarbonate: 20.4 mmol/L (ref 20.0–28.0)
Bicarbonate: 21.9 mmol/L (ref 20.0–28.0)
Calcium, Ion: 1.09 mmol/L — ABNORMAL LOW (ref 1.15–1.40)
Calcium, Ion: 1.03 mmol/L — ABNORMAL LOW (ref 1.15–1.40)
Calcium, Ion: 1.05 mmol/L — ABNORMAL LOW (ref 1.15–1.40)
HCT: 36 % (ref 36.0–46.0)
HCT: 34 % — ABNORMAL LOW (ref 36.0–46.0)
HCT: 37 % (ref 36.0–46.0)
Hemoglobin: 12.2 g/dL (ref 12.0–15.0)
Hemoglobin: 11.6 g/dL — ABNORMAL LOW (ref 12.0–15.0)
Hemoglobin: 12.6 g/dL (ref 12.0–15.0)
O2 Saturation: 93 %
O2 Saturation: 90 %
O2 Saturation: 90 %
Patient temperature: 37
Patient temperature: 36.2
Patient temperature: 36
Potassium: 3.8 mmol/L (ref 3.5–5.1)
Potassium: 3.5 mmol/L (ref 3.5–5.1)
Potassium: 3.7 mmol/L (ref 3.5–5.1)
Sodium: 143 mmol/L (ref 135–145)
Sodium: 142 mmol/L (ref 135–145)
Sodium: 142 mmol/L (ref 135–145)
TCO2: 23 mmol/L (ref 22–32)
TCO2: 22 mmol/L (ref 22–32)
TCO2: 23 mmol/L (ref 22–32)
pCO2 arterial: 38.6 mmHg (ref 32.0–48.0)
pCO2 arterial: 40.8 mmHg (ref 32.0–48.0)
pCO2 arterial: 44.5 mmHg (ref 32.0–48.0)
pH, Arterial: 7.355 (ref 7.350–7.450)
pH, Arterial: 7.302 — ABNORMAL LOW (ref 7.350–7.450)
pH, Arterial: 7.295 — ABNORMAL LOW (ref 7.350–7.450)
pO2, Arterial: 71 mmHg — ABNORMAL LOW (ref 83.0–108.0)
pO2, Arterial: 62 mmHg — ABNORMAL LOW (ref 83.0–108.0)
pO2, Arterial: 62 mmHg — ABNORMAL LOW (ref 83.0–108.0)

## 2019-03-19 LAB — TRIGLYCERIDES: Triglycerides: 131 mg/dL (ref <150)

## 2019-03-19 LAB — CBC
HCT: 36.9 % (ref 36.0–46.0)
HCT: 34.9 % — ABNORMAL LOW (ref 36.0–46.0)
Hemoglobin: 12.8 g/dL (ref 12.0–15.0)
Hemoglobin: 11.7 g/dL — ABNORMAL LOW (ref 12.0–15.0)
MCH: 29.1 pg (ref 26.0–34.0)
MCH: 29.2 pg (ref 26.0–34.0)
MCHC: 34.7 g/dL (ref 30.0–36.0)
MCHC: 33.5 g/dL (ref 30.0–36.0)
MCV: 83.9 fL (ref 80.0–100.0)
MCV: 87 fL (ref 80.0–100.0)
Platelets: 17 10*3/uL — CL (ref 150–400)
Platelets: 46 10*3/uL — ABNORMAL LOW (ref 150–400)
RBC: 4.4 MIL/uL (ref 3.87–5.11)
RBC: 4.01 MIL/uL (ref 3.87–5.11)
RDW: 15.5 % (ref 11.5–15.5)
RDW: 16.5 % — ABNORMAL HIGH (ref 11.5–15.5)
WBC: 15.4 10*3/uL — ABNORMAL HIGH (ref 4.0–10.5)
WBC: 13.2 10*3/uL — ABNORMAL HIGH (ref 4.0–10.5)
nRBC: 0.7 % — ABNORMAL HIGH (ref 0.0–0.2)
nRBC: 1.2 % — ABNORMAL HIGH (ref 0.0–0.2)

## 2019-03-19 LAB — BPAM RBC
Blood Product Expiration Date: 202007132359
Blood Product Expiration Date: 202007132359
Blood Product Expiration Date: 202007252359
Blood Product Expiration Date: 202007252359
Blood Product Expiration Date: 202007252359
Blood Product Expiration Date: 202007252359
Blood Product Expiration Date: 202007252359
Blood Product Expiration Date: 202007262359
Blood Product Expiration Date: 202007262359
Blood Product Expiration Date: 202007262359
Blood Product Expiration Date: 202007262359
Blood Product Expiration Date: 202007262359
Blood Product Expiration Date: 202007262359
Blood Product Expiration Date: 202007262359
Blood Product Expiration Date: 202007262359
Blood Product Expiration Date: 202007262359
Blood Product Expiration Date: 202007262359
Blood Product Expiration Date: 202007262359
Blood Product Expiration Date: 202007262359
Blood Product Expiration Date: 202007262359
Blood Product Expiration Date: 202007262359
Blood Product Expiration Date: 202007262359
Blood Product Expiration Date: 202007262359
Blood Product Expiration Date: 202007272359
ISSUE DATE / TIME: 202006262222
ISSUE DATE / TIME: 202006262222
ISSUE DATE / TIME: 202006262222
ISSUE DATE / TIME: 202006262222
ISSUE DATE / TIME: 202006270321
ISSUE DATE / TIME: 202006270321
ISSUE DATE / TIME: 202006270321
ISSUE DATE / TIME: 202006270321
ISSUE DATE / TIME: 202006270530
ISSUE DATE / TIME: 202006270530
ISSUE DATE / TIME: 202006270530
ISSUE DATE / TIME: 202006270530
ISSUE DATE / TIME: 202006270550
ISSUE DATE / TIME: 202006270550
ISSUE DATE / TIME: 202006270550
ISSUE DATE / TIME: 202006270550
ISSUE DATE / TIME: 202006270925
ISSUE DATE / TIME: 202006270925
ISSUE DATE / TIME: 202006270925
ISSUE DATE / TIME: 202006270925
Unit Type and Rh: 6200
Unit Type and Rh: 6200
Unit Type and Rh: 6200
Unit Type and Rh: 6200
Unit Type and Rh: 6200
Unit Type and Rh: 6200
Unit Type and Rh: 6200
Unit Type and Rh: 6200
Unit Type and Rh: 6200
Unit Type and Rh: 6200
Unit Type and Rh: 6200
Unit Type and Rh: 6200
Unit Type and Rh: 6200
Unit Type and Rh: 6200
Unit Type and Rh: 6200
Unit Type and Rh: 6200
Unit Type and Rh: 6200
Unit Type and Rh: 6200
Unit Type and Rh: 6200
Unit Type and Rh: 6200
Unit Type and Rh: 6200
Unit Type and Rh: 6200
Unit Type and Rh: 6200
Unit Type and Rh: 6200

## 2019-03-19 LAB — COMPREHENSIVE METABOLIC PANEL
ALT: 92 U/L — ABNORMAL HIGH (ref 0–44)
AST: 183 U/L — ABNORMAL HIGH (ref 15–41)
Albumin: 2.4 g/dL — ABNORMAL LOW (ref 3.5–5.0)
Alkaline Phosphatase: 51 U/L (ref 38–126)
Anion gap: 14 (ref 5–15)
BUN: 42 mg/dL — ABNORMAL HIGH (ref 8–23)
CO2: 21 mmol/L — ABNORMAL LOW (ref 22–32)
Calcium: 7.6 mg/dL — ABNORMAL LOW (ref 8.9–10.3)
Chloride: 107 mmol/L (ref 98–111)
Creatinine, Ser: 3.69 mg/dL — ABNORMAL HIGH (ref 0.44–1.00)
GFR calc Af Amer: 13 mL/min — ABNORMAL LOW (ref >60)
GFR calc non Af Amer: 12 mL/min — ABNORMAL LOW (ref >60)
Glucose, Bld: 128 mg/dL — ABNORMAL HIGH (ref 70–99)
Potassium: 3.9 mmol/L (ref 3.5–5.1)
Sodium: 142 mmol/L (ref 135–145)
Total Bilirubin: 2 mg/dL — ABNORMAL HIGH (ref 0.3–1.2)
Total Protein: 3.7 g/dL — ABNORMAL LOW (ref 6.5–8.1)

## 2019-03-19 LAB — COOXEMETRY PANEL
Carboxyhemoglobin: 0.7 % (ref 0.5–1.5)
Carboxyhemoglobin: 0.6 % (ref 0.5–1.5)
Methemoglobin: 0.9 % (ref 0.0–1.5)
Methemoglobin: 1 % (ref 0.0–1.5)
O2 Saturation: 66.3 %
O2 Saturation: 81.4 %
Total hemoglobin: 11.9 g/dL — ABNORMAL LOW (ref 12.0–16.0)
Total hemoglobin: 11.7 g/dL — ABNORMAL LOW (ref 12.0–16.0)

## 2019-03-19 LAB — CULTURE, RESPIRATORY W GRAM STAIN
Culture: NO GROWTH
Special Requests: NORMAL

## 2019-03-19 LAB — GLUCOSE, CAPILLARY
Glucose-Capillary: 123 mg/dL — ABNORMAL HIGH (ref 70–99)
Glucose-Capillary: 91 mg/dL (ref 70–99)
Glucose-Capillary: 77 mg/dL (ref 70–99)
Glucose-Capillary: 67 mg/dL — ABNORMAL LOW (ref 70–99)
Glucose-Capillary: 78 mg/dL (ref 70–99)
Glucose-Capillary: 115 mg/dL — ABNORMAL HIGH (ref 70–99)

## 2019-03-19 MED ORDER — DEXTROSE 5 % IV SOLN: INTRAVENOUS | Status: DC

## 2019-03-19 MED ORDER — METOLAZONE 5 MG PO TABS
10.0000 mg | ORAL_TABLET | Freq: Once | ORAL | Status: AC
  Administered 2019-03-19: 10 mg via ORAL
  Filled 2019-03-19: qty 2

## 2019-03-19 MED ORDER — SODIUM BICARBONATE 8.4 % IV SOLN
50.0000 meq | Freq: Once | INTRAVENOUS | Status: AC
  Administered 2019-03-19: 50 meq via INTRAVENOUS
  Filled 2019-03-19: qty 50

## 2019-03-19 MED ORDER — SODIUM BICARBONATE 8.4 % IV SOLN
INTRAVENOUS | Status: AC
  Administered 2019-03-19: 18:00:00 25 meq via INTRAVENOUS
  Filled 2019-03-19: qty 50

## 2019-03-19 MED ORDER — SODIUM BICARBONATE 8.4 % IV SOLN
25.0000 meq | Freq: Once | INTRAVENOUS | Status: AC
  Administered 2019-03-19: 18:00:00 25 meq via INTRAVENOUS

## 2019-03-19 MED ORDER — DEXTROSE 10 % IV SOLN
INTRAVENOUS | Status: DC
  Administered 2019-03-19 – 2019-03-22 (×4): via INTRAVENOUS

## 2019-03-19 MED ADMIN — Sodium Chloride IV Soln 0.9%: INTRAVENOUS | @ 22:00:00

## 2019-03-19 NOTE — Progress Notes (Signed)
LTM maintenance completed; no skin breakdown was seen.

## 2019-03-19 NOTE — Progress Notes (Signed)
RT note: patient does not meet SBT criteria for this AM.  Tolerating current ventilator settings well.  Will continue to monitor.

## 2019-03-19 NOTE — Progress Notes (Signed)
Assisted tele visit to patient with daughter and family member.  Avalene Sealy, Aliene Beams, RN

## 2019-03-19 NOTE — Procedures (Signed)
  Electroencephalogram report- LTM with VIDEO   Beginning time: 03/18/19 at 26 Ending time:  03/19/19 at 0730 CPT/type : 95720  Day of study: day 1    Technical Description: The EEG was performed using standard setting per the guidelines of American Clinical Neurophysiology Society (ACNS).    A minimum of 21 electrodes were placed on scalp according to the International 10-20 or/and 10-10 Systems. Supplemental electrodes were placed as needed. Single EKG electrode was also used to detect cardiac arrhythmia. Patient's behavior was continuously recorded on video simultaneously with EEG. A minimum of 16 channels were used for data display. Each epoch of study was reviewed manually daily and as needed using standard referential and bipolar montages. Computerized quantitative EEG analysis (such as compressed spectral array analysis, trending, automated spike & seizure detection) were used as indicated.    Spike detection: ON  Seizure detection: ON   This   continuous  EEG monitoring with simultaneous video monitoring was performed for this patient with  NCSE to ensure resolution of seizures.  Day 1 : This recording begins background activities marked by nonreactive attenuated slowing with superimposed continuous to 2.5 cps broadly distributed spike and wave discharges maximum negativity left frontocentral cortex consistent with nonconvulsive status epilepticus.  With medication adjustment as recording continuous background activities evolve into suppressed nonreactive background activities with superimposed predominantly left frontocentral sharp waves and spike and wave discharges was brought negative field at times to given appearance of generalized spike and wave discharges.  At times such broad epileptiform discharges appear in short 3 to 5 seconds along evolving trains concerning for brief electrographic seizures.  Clinical interpretation: This day 1 of was abnormal and consistent with severe  encephalopathy of nonspecific etiologies.  Nonconvulsive status epilepticus largely resolved however background activities continue to demonstrate frequent broad spike and wave discharges maximal in the left frontocentral cortex.  Brief electrographic seizures cannot be excluded.  Please see above discussion.  Continuous monitoring is recommended to ensure resolution of seizures and improvement of background activities.  Clinical correlation is advised.

## 2019-03-19 NOTE — Progress Notes (Signed)
Patient ID: Madeline [name], female   DOB: 1946/01/03, 73 y.o.   MRN: [id number] [facility] Evening Rounds:  Hemodynamically stable in sinus with frequent premature beats.  Epi 3, NE 5, milrinone 0.25  Remains unresponsive on vent on versed and propofol for persistent seizure activity.  Urine output ok on lasix drip.  BMET    Component Value Date/Time   NA 142 03/19/2019 1815   NA 143 08/24/2017 1457   K 4.0 03/19/2019 1815   CL 106 03/19/2019 1815   CO2 22 03/19/2019 1815   GLUCOSE 121 (H) 03/19/2019 1815   BUN 45 (H) 03/19/2019 1815   BUN 14 08/24/2017 1457   CREATININE 3.91 (H) 03/19/2019 1815   CALCIUM 7.5 (L) 03/19/2019 1815   GFRNONAA 11 (L) 03/19/2019 1815   GFRAA 12 (L) 03/19/2019 1815   CBC    Component Value Date/Time   WBC 15.4 (H) 03/19/2019 0354   RBC 4.40 03/19/2019 0354   HGB 11.6 (L) 03/19/2019 1656   HCT 34.0 (L) 03/19/2019 1656   PLT 17 (LL) 03/19/2019 0354   MCV 83.9 03/19/2019 0354   MCH 29.1 03/19/2019 0354   MCHC 34.7 03/19/2019 0354   RDW 15.5 03/19/2019 0354   LYMPHSABS 1.4 02/24/2019 1200   MONOABS 1.0 03/18/2019 1200   EOSABS 0.0 03/05/2019 1200   BASOSABS 0.0 02/25/2019 1200

## 2019-03-19 NOTE — Progress Notes (Signed)
Fredrik Cove, RN spoke with patient's nurse Apolonio Schneiders regarding PICC order.  Per Apolonio Schneiders, she will discuss with MD when rounds are made and have ordered canceled if still on hold.  She will notify IV team if MD does want PICC done today.  Carolee Rota, RN VAST

## 2019-03-19 NOTE — Progress Notes (Addendum)
Subjective: Sedated with Versed and propofol.   Objective: Current vital signs: BP (!) 101/50   Pulse 93   Temp 99.1 F (37.3 C)   Resp 16   Ht _0  (1.727 m)   Wt 105 kg   SpO2 94%   BMI 35.20 kg/m  Vital signs in last 24 hours: Temp:  [95.9 F (35.5 C)-99.1 F (37.3 C)] 99.1 F (37.3 C) (06/30 0700) Pulse Rate:  [79-149] 93 (06/30 0700) Resp:  [13-24] 16 (06/30 0700) BP: (67-147)/(43-75) 101/50 (06/30 0501) SpO2:  [93 %-98 %] 94 % (06/30 0700) Arterial Line BP: (93-160)/(46-78) 104/46 (06/30 0700) FiO2 (%):  [60 %-90 %] 60 % (06/30 0501) Weight:  [105 kg] 105 kg (06/30 0500)  Intake/Output from previous day: 06/29 0701 - 06/30 0700 In: 4783.1 [I.V.:4314.3; IV Piggyback:468.8] Out: 898 [Urine:538; Chest Tube:360] Intake/Output this shift: No intake/output data recorded. Nutritional status:  Diet Order            Diet NPO time specified  Diet effective now              Neurologic Exam: Ment: Sedated with Versed and propofol. Does not arouse to light tactile stimuli or auditory stimuli.  CN: Eyes closed. Face flaccidly symmetric.  Motor: No spontaneous movement.   Lab Results: Results for orders placed or performed during the hospital encounter of 03/03/2019 (from the past 48 hour(s))  Glucose, capillary     Status: Abnormal   Collection Time: 03/17/19  7:33 AM  Result Value Ref Range   Glucose-Capillary 116 (H) 70 - 99 mg/dL  Glucose, capillary     Status: Abnormal   Collection Time: 03/17/19  8:39 AM  Result Value Ref Range   Glucose-Capillary 117 (H) 70 - 99 mg/dL  Glucose, capillary     Status: Abnormal   Collection Time: 03/17/19  9:33 AM  Result Value Ref Range   Glucose-Capillary 109 (H) 70 - 99 mg/dL  .Cooxemetry Panel (carboxy, met, total hgb, O2 sat)     Status: Abnormal   Collection Time: 03/17/19 10:00 AM  Result Value Ref Range   Total hemoglobin 16.4 (H) 12.0 - 16.0 g/dL   O2 Saturation 66.1 %   Carboxyhemoglobin 0.6 0.5 - 1.5 %    Methemoglobin 0.6 0.0 - 1.5 %  Glucose, capillary     Status: Abnormal   Collection Time: 03/17/19 10:44 AM  Result Value Ref Range   Glucose-Capillary 114 (H) 70 - 99 mg/dL  I-STAT 4, (NA,K, GLUC, HGB,HCT)     Status: Abnormal   Collection Time: 03/17/19 10:46 AM  Result Value Ref Range   Sodium 149 (H) 135 - 145 mmol/L   Potassium 3.7 3.5 - 5.1 mmol/L   Glucose, Bld 118 (H) 70 - 99 mg/dL   HCT 43.0 36.0 - 46.0 %   Hemoglobin 14.6 12.0 - 15.0 g/dL  Culture, respiratory (non-expectorated)     Status: None (Preliminary result)   Collection Time: 03/17/19 11:37 AM   Specimen: Tracheal Aspirate; Respiratory  Result Value Ref Range   Specimen Description TRACHEAL ASPIRATE    Special Requests Normal    Gram Stain      FEW WBC PRESENT, PREDOMINANTLY MONONUCLEAR NO ORGANISMS SEEN    Culture      NO GROWTH < 24 HOURS Performed at [facility], [address] [place], [place] [address]    Report Status PENDING   Glucose, capillary     Status: Abnormal   Collection Time: 03/17/19 11:45 AM  Result Value Ref Range   Glucose-Capillary 122 (H) 70 - 99 mg/dL  Glucose, capillary     Status: Abnormal   Collection Time: 03/17/19 12:51 PM  Result Value Ref Range   Glucose-Capillary 123 (H) 70 - 99 mg/dL  I-STAT 7, (LYTES, BLD GAS, ICA, H+H)     Status: Abnormal   Collection Time: 03/17/19  1:02 PM  Result Value Ref Range   pH, Arterial 7.512 (H) 7.350 - 7.450   pCO2 arterial 29.5 (L) 32.0 - 48.0 mmHg   pO2, Arterial 80.0 (L) 83.0 - 108.0 mmHg   Bicarbonate 23.5 20.0 - 28.0 mmol/L   TCO2 24 22 - 32 mmol/L   O2 Saturation 97.0 %   Acid-Base Excess 2.0 0.0 - 2.0 mmol/L   Sodium 147 (H) 135 - 145 mmol/L   Potassium 3.7 3.5 - 5.1 mmol/L   Calcium, Ion 1.25 1.15 - 1.40 mmol/L   HCT 41.0 36.0 - 46.0 %   Hemoglobin 13.9 12.0 - 15.0 g/dL   Patient temperature 37.3 C    Collection site ARTERIAL LINE    Drawn by Nurse    Sample type ARTERIAL   Glucose, capillary     Status: Abnormal    Collection Time: 03/17/19  1:50 PM  Result Value Ref Range   Glucose-Capillary 126 (H) 70 - 99 mg/dL  Glucose, capillary     Status: Abnormal   Collection Time: 03/17/19  2:57 PM  Result Value Ref Range   Glucose-Capillary 138 (H) 70 - 99 mg/dL  Basic metabolic panel     Status: Abnormal   Collection Time: 03/17/19  6:52 PM  Result Value Ref Range   Sodium 146 (H) 135 - 145 mmol/L   Potassium 3.9 3.5 - 5.1 mmol/L   Chloride 109 98 - 111 mmol/L   CO2 23 22 - 32 mmol/L   Glucose, Bld 211 (H) 70 - 99 mg/dL   BUN 26 (H) 8 - 23 mg/dL   Creatinine, Ser 2.58 (H) 0.44 - 1.00 mg/dL   Calcium 8.8 (L) 8.9 - 10.3 mg/dL   GFR calc non Af Amer 18 (L) >60 mL/min   GFR calc Af Amer 21 (L) >60 mL/min   Anion gap 14 5 - 15    Comment: Performed at [facility], [place] [address], [place], [place] [address]  Magnesium     Status: Abnormal   Collection Time: 03/17/19  6:52 PM  Result Value Ref Range   Magnesium 2.6 (H) 1.7 - 2.4 mg/dL    Comment: Performed at [facility] [address] [place], [place] [address]  CBC     Status: Abnormal   Collection Time: 03/17/19  6:52 PM  Result Value Ref Range   WBC 19.2 (H) 4.0 - 10.5 K/uL   RBC 4.89 3.87 - 5.11 MIL/uL   Hemoglobin 14.3 12.0 - 15.0 g/dL   HCT 40.4 36.0 - 46.0 %   MCV 82.6 80.0 - 100.0 fL   MCH 29.2 26.0 - 34.0 pg   MCHC 35.4 30.0 - 36.0 g/dL   RDW 14.7 11.5 - 15.5 %   Platelets 33 (L) 150 - 400 K/uL    Comment: REPEATED TO VERIFY Immature Platelet Fraction may be clinically indicated, consider ordering this additional test [id number]    nRBC 0.3 (H) 0.0 - 0.2 %    Comment: Performed at [facility], [place] [address] [place], [place] [address]  .Cooxemetry Panel (carboxy, met, total hgb, O2 sat)  Status: None   Collection Time: 03/17/19  7:00 PM  Result Value Ref Range   Total hemoglobin 14.9 12.0 - 16.0 g/dL   O2 Saturation 78.8 %   Carboxyhemoglobin 0.6 0.5 - 1.5 %   Methemoglobin 0.7 0.0 - 1.5 %  Glucose,  capillary     Status: Abnormal   Collection Time: 03/17/19  8:04 PM  Result Value Ref Range   Glucose-Capillary 174 (H) 70 - 99 mg/dL  Glucose, capillary     Status: Abnormal   Collection Time: 03/17/19 11:53 PM  Result Value Ref Range   Glucose-Capillary 162 (H) 70 - 99 mg/dL  I-STAT 7, (LYTES, BLD GAS, ICA, H+H)     Status: Abnormal   Collection Time: 03/17/19 11:57 PM  Result Value Ref Range   pH, Arterial 7.531 (H) 7.350 - 7.450   pCO2 arterial 28.1 (L) 32.0 - 48.0 mmHg   pO2, Arterial 82.0 (L) 83.0 - 108.0 mmHg   Bicarbonate 23.5 20.0 - 28.0 mmol/L   TCO2 24 22 - 32 mmol/L   O2 Saturation 97.0 %   Acid-Base Excess 2.0 0.0 - 2.0 mmol/L   Sodium 146 (H) 135 - 145 mmol/L   Potassium 3.5 3.5 - 5.1 mmol/L   Calcium, Ion 1.19 1.15 - 1.40 mmol/L   HCT 36.0 36.0 - 46.0 %   Hemoglobin 12.2 12.0 - 15.0 g/dL   Patient temperature 37.0 C    Sample type ARTERIAL   Comprehensive metabolic panel     Status: Abnormal   Collection Time: 03/18/19  4:19 AM  Result Value Ref Range   Sodium 145 135 - 145 mmol/L   Potassium 3.6 3.5 - 5.1 mmol/L   Chloride 110 98 - 111 mmol/L   CO2 24 22 - 32 mmol/L   Glucose, Bld 153 (H) 70 - 99 mg/dL   BUN 31 (H) 8 - 23 mg/dL   Creatinine, Ser 3.12 (H) 0.44 - 1.00 mg/dL   Calcium 8.5 (L) 8.9 - 10.3 mg/dL   Total Protein 3.6 (L) 6.5 - 8.1 g/dL   Albumin 2.5 (L) 3.5 - 5.0 g/dL   AST 104 (H) 15 - 41 U/L   ALT 51 (H) 0 - 44 U/L   Alkaline Phosphatase 40 38 - 126 U/L   Total Bilirubin 3.6 (H) 0.3 - 1.2 mg/dL   GFR calc non Af Amer 14 (L) >60 mL/min   GFR calc Af Amer 16 (L) >60 mL/min   Anion gap 11 5 - 15    Comment: Performed at [facility], [place] [address] [place], [place] [address]  CBC     Status: Abnormal   Collection Time: 03/18/19  4:19 AM  Result Value Ref Range   WBC 16.6 (H) 4.0 - 10.5 K/uL   RBC 4.35 3.87 - 5.11 MIL/uL   Hemoglobin 12.7 12.0 - 15.0 g/dL   HCT 36.2 36.0 - 46.0 %   MCV 83.2 80.0 - 100.0 fL   MCH 29.2 26.0 - 34.0 pg    MCHC 35.1 30.0 - 36.0 g/dL   RDW 14.7 11.5 - 15.5 %   Platelets 28 (LL) 150 - 400 K/uL    Comment: REPEATED TO VERIFY Immature Platelet Fraction may be clinically indicated, consider ordering this additional test [id number] CONSISTENT WITH PREVIOUS RESULT THIS CRITICAL RESULT HAS VERIFIED AND BEEN CALLED TO PAOLA HENRITUEC RN BY ZACH ROBINSON ON 06 29 2020 AT 0444, AND HAS BEEN READ BACK.     nRBC 0.4 (H) 0.0 - 0.2 %  Comment: Performed at [facility], [place] [address], [place], [place] [address]  .Cooxemetry Panel (carboxy, met, total hgb, O2 sat)     Status: None   Collection Time: 03/18/19  4:20 AM  Result Value Ref Range   Total hemoglobin 12.6 12.0 - 16.0 g/dL   O2 Saturation 79.8 %   Carboxyhemoglobin 0.6 0.5 - 1.5 %   Methemoglobin 0.9 0.0 - 1.5 %  Glucose, capillary     Status: Abnormal   Collection Time: 03/18/19  4:20 AM  Result Value Ref Range   Glucose-Capillary 147 (H) 70 - 99 mg/dL  I-STAT 7, (LYTES, BLD GAS, ICA, H+H)     Status: Abnormal   Collection Time: 03/18/19  4:24 AM  Result Value Ref Range   pH, Arterial 7.521 (H) 7.350 - 7.450   pCO2 arterial 31.0 (L) 32.0 - 48.0 mmHg   pO2, Arterial 90.0 83.0 - 108.0 mmHg   Bicarbonate 25.4 20.0 - 28.0 mmol/L   TCO2 26 22 - 32 mmol/L   O2 Saturation 98.0 %   Acid-Base Excess 3.0 (H) 0.0 - 2.0 mmol/L   Sodium 145 135 - 145 mmol/L   Potassium 3.5 3.5 - 5.1 mmol/L   Calcium, Ion 1.22 1.15 - 1.40 mmol/L   HCT 35.0 (L) 36.0 - 46.0 %   Hemoglobin 11.9 (L) 12.0 - 15.0 g/dL   Patient temperature 36.9 C    Sample type ARTERIAL   Surgical pcr screen     Status: Abnormal   Collection Time: 03/18/19  5:09 AM   Specimen: Nasal Mucosa; Nasal Swab  Result Value Ref Range   MRSA, PCR NEGATIVE NEGATIVE   Staphylococcus aureus POSITIVE (A) NEGATIVE    Comment: (NOTE) The Xpert SA Assay (FDA approved for NASAL specimens in patients 61 years of age and older), is one component of a comprehensive surveillance program. It  is not intended to diagnose infection nor to guide or monitor treatment. Performed at [facility], [place] [address] [place], [place] [address]   Glucose, capillary     Status: Abnormal   Collection Time: 03/18/19  7:43 AM  Result Value Ref Range   Glucose-Capillary 138 (H) 70 - 99 mg/dL  Glucose, capillary     Status: Abnormal   Collection Time: 03/18/19 12:15 PM  Result Value Ref Range   Glucose-Capillary 117 (H) 70 - 99 mg/dL  Glucose, capillary     Status: Abnormal   Collection Time: 03/18/19  3:39 PM  Result Value Ref Range   Glucose-Capillary 111 (H) 70 - 99 mg/dL  I-STAT 7, (LYTES, BLD GAS, ICA, H+H)     Status: Abnormal   Collection Time: 03/18/19  4:46 PM  Result Value Ref Range   pH, Arterial 7.411 7.350 - 7.450   pCO2 arterial 35.8 32.0 - 48.0 mmHg   pO2, Arterial 74.0 (L) 83.0 - 108.0 mmHg   Bicarbonate 22.9 20.0 - 28.0 mmol/L   TCO2 24 22 - 32 mmol/L   O2 Saturation 96.0 %   Acid-base deficit 2.0 0.0 - 2.0 mmol/L   Sodium 143 135 - 145 mmol/L   Potassium 3.8 3.5 - 5.1 mmol/L   Calcium, Ion 1.14 (L) 1.15 - 1.40 mmol/L   HCT 35.0 (L) 36.0 - 46.0 %   Hemoglobin 11.9 (L) 12.0 - 15.0 g/dL   Patient temperature 36.2 C    Collection site ARTERIAL LINE    Drawn by Operator    Sample type ARTERIAL   .Cooxemetry Panel (carboxy, met,  total hgb, O2 sat)     Status: None   Collection Time: 03/18/19  4:50 PM  Result Value Ref Range   Total hemoglobin 12.6 12.0 - 16.0 g/dL   O2 Saturation 78.6 %   Carboxyhemoglobin 0.7 0.5 - 1.5 %   Methemoglobin 0.8 0.0 - 1.5 %  Glucose, capillary     Status: Abnormal   Collection Time: 03/18/19  8:12 PM  Result Value Ref Range   Glucose-Capillary 103 (H) 70 - 99 mg/dL  Triglycerides     Status: None   Collection Time: 03/18/19 11:10 PM  Result Value Ref Range   Triglycerides 131 <150 mg/dL    Comment: Performed at [facility], [place] [address] [place], [place] [address]  Glucose, capillary     Status: Abnormal    Collection Time: 03/18/19 11:13 PM  Result Value Ref Range   Glucose-Capillary 124 (H) 70 - 99 mg/dL  Comprehensive metabolic panel     Status: Abnormal   Collection Time: 03/19/19  3:54 AM  Result Value Ref Range   Sodium 142 135 - 145 mmol/L   Potassium 3.9 3.5 - 5.1 mmol/L   Chloride 107 98 - 111 mmol/L   CO2 21 (L) 22 - 32 mmol/L   Glucose, Bld 128 (H) 70 - 99 mg/dL   BUN 42 (H) 8 - 23 mg/dL   Creatinine, Ser 3.69 (H) 0.44 - 1.00 mg/dL   Calcium 7.6 (L) 8.9 - 10.3 mg/dL   Total Protein 3.7 (L) 6.5 - 8.1 g/dL   Albumin 2.4 (L) 3.5 - 5.0 g/dL   AST 183 (H) 15 - 41 U/L   ALT 92 (H) 0 - 44 U/L   Alkaline Phosphatase 51 38 - 126 U/L   Total Bilirubin 2.0 (H) 0.3 - 1.2 mg/dL   GFR calc non Af Amer 12 (L) >60 mL/min   GFR calc Af Amer 13 (L) >60 mL/min   Anion gap 14 5 - 15    Comment: Performed at [facility], [place] [address] [place], [place] [address]  CBC     Status: Abnormal   Collection Time: 03/19/19  3:54 AM  Result Value Ref Range   WBC 15.4 (H) 4.0 - 10.5 K/uL   RBC 4.40 3.87 - 5.11 MIL/uL   Hemoglobin 12.8 12.0 - 15.0 g/dL   HCT 36.9 36.0 - 46.0 %   MCV 83.9 80.0 - 100.0 fL   MCH 29.1 26.0 - 34.0 pg   MCHC 34.7 30.0 - 36.0 g/dL   RDW 15.5 11.5 - 15.5 %   Platelets 17 (LL) 150 - 400 K/uL    Comment: REPEATED TO VERIFY Immature Platelet Fraction may be clinically indicated, consider ordering this additional test [id number] CRITICAL VALUE NOTED.  VALUE IS CONSISTENT WITH PREVIOUSLY REPORTED AND CALLED VALUE.    nRBC 0.7 (H) 0.0 - 0.2 %    Comment: Performed at [facility], [place] [address], [place], [place] [address]  Glucose, capillary     Status: Abnormal   Collection Time: 03/19/19  3:58 AM  Result Value Ref Range   Glucose-Capillary 123 (H) 70 - 99 mg/dL  .Cooxemetry Panel (carboxy, met, total hgb, O2 sat)     Status: Abnormal   Collection Time: 03/19/19  4:05 AM  Result Value Ref Range   Total hemoglobin 11.9 (L) 12.0 - 16.0 g/dL   O2 Saturation  66.3 %   Carboxyhemoglobin 0.7 0.5 - 1.5 %   Methemoglobin 0.9 0.0 -  1.5 %  I-STAT 7, (LYTES, BLD GAS, ICA, H+H)     Status: Abnormal   Collection Time: 03/19/19  5:50 AM  Result Value Ref Range   pH, Arterial 7.355 7.350 - 7.450   pCO2 arterial 38.6 32.0 - 48.0 mmHg   pO2, Arterial 71.0 (L) 83.0 - 108.0 mmHg   Bicarbonate 21.5 20.0 - 28.0 mmol/L   TCO2 23 22 - 32 mmol/L   O2 Saturation 93.0 %   Acid-base deficit 4.0 (H) 0.0 - 2.0 mmol/L   Sodium 143 135 - 145 mmol/L   Potassium 3.8 3.5 - 5.1 mmol/L   Calcium, Ion 1.09 (L) 1.15 - 1.40 mmol/L   HCT 36.0 36.0 - 46.0 %   Hemoglobin 12.2 12.0 - 15.0 g/dL   Patient temperature 37.0 C    Sample type ARTERIAL     Recent Results (from the past 240 hour(s))  SARS Coronavirus 2 (Hosp order,Performed in [facility] lab via [employer] ID)     Status: None   Collection Time: 03/08/2019  6:04 PM   Specimen: Dry Nasal Swab ([employer] ID Now)  Result Value Ref Range Status   SARS Coronavirus 2 ([employer] ID Now) NEGATIVE NEGATIVE Final    Comment: (NOTE) Interpretive Result Comment(s): COVID 19 Positive SARS CoV 2 target nucleic acids are DETECTED. The SARS CoV 2 RNA is generally detectable in upper and lower respiratory specimens during the acute phase of infection.  Positive results are indicative of active infection with SARS CoV 2.  Clinical correlation with patient history and other diagnostic information is necessary to determine patient infection status.  Positive results do not rule out bacterial infection or coinfection with other viruses. The expected result is Negative. COVID 19 Negative SARS CoV 2 target nucleic acids are NOT DETECTED. The SARS CoV 2 RNA is generally detectable in upper and lower respiratory specimens during the acute phase of infection.  Negative results do not preclude SARS CoV 2 infection, do not rule out coinfections with other pathogens, and should not be used as the sole basis for treatment or other patient  management decisions.  Negative results must be combined with clinical  observations, patient history, and epidemiological information. The expected result is Negative. Invalid Presence or absence of SARS CoV 2 nucleic acids cannot be determined. Repeat testing was performed on the submitted specimen and repeated Invalid results were obtained.  If clinically indicated, additional testing on a new specimen with an alternate test methodology [phone]) is advised.  The SARS CoV 2 RNA is generally detectable in upper and lower respiratory specimens during the acute phase of infection. The expected result is Negative. Fact Sheet for Patients:  GolfingFamily.no Fact Sheet for Healthcare Providers: https://www.hernandez-brewer.com/ This test is not yet approved or cleared by the [place] FDA and has been authorized for detection and/or diagnosis of SARS CoV 2 by FDA under an Emergency Use Authorization (EUA).  This EUA will remain in effect (meaning this test can be used) for the duration of the COVID19 d eclaration under Section 564(b)(1) of the Act, 21 U.S.C. section [phone] 3(b)(1), unless the authorization is terminated or revoked sooner. Performed at [facility], [address] [place], [place] [address]   Culture, respiratory (non-expectorated)     Status: None (Preliminary result)   Collection Time: 03/17/19 11:37 AM   Specimen: Tracheal Aspirate; Respiratory  Result Value Ref Range Status   Specimen Description TRACHEAL ASPIRATE  Final   Special Requests Normal  Final   Gram  Stain   Final    FEW WBC PRESENT, PREDOMINANTLY MONONUCLEAR NO ORGANISMS SEEN    Culture   Final    NO GROWTH < 24 HOURS Performed at [facility] [address] [place], [place] [address]    Report Status PENDING  Incomplete  Surgical pcr screen     Status: Abnormal   Collection Time: 03/18/19  5:09 AM   Specimen: Nasal Mucosa; Nasal Swab   Result Value Ref Range Status   MRSA, PCR NEGATIVE NEGATIVE Final   Staphylococcus aureus POSITIVE (A) NEGATIVE Final    Comment: (NOTE) The Xpert SA Assay (FDA approved for NASAL specimens in patients 108 years of age and older), is one component of a comprehensive surveillance program. It is not intended to diagnose infection nor to guide or monitor treatment. Performed at [facility], [place] [address] [place], [place] [address]     Lipid Panel Recent Labs    03/18/19 2310  TRIG 131    Studies/Results: Dg Chest Port 1 View  Result Date: 03/19/2019 CLINICAL DATA:  Status post aortic dissection repair EXAM: PORTABLE CHEST 1 VIEW COMPARISON:  03/18/2019 FINDINGS: Cardiac shadow is stable. Postoperative changes are again seen. Endotracheal tube is seen in satisfactory position. Gastric catheter remains in the distal esophagus and should be advanced several cm. Swan-Ganz catheter, mediastinal drain and bilateral thoracostomy catheters are again seen and stable. No evidence of pneumothorax is noted. Bibasilar infiltrative changes are noted increased from the prior exam. No bony abnormality is seen. IMPRESSION: Tubes and lines as described above. The gastric catheter remains in the distal esophagus and could be advanced several cm. Bibasilar infiltrative changes increased from the prior exam. Electronically Signed   By: Inez Catalina M.D.   On: 03/19/2019 07:23   Dg Chest Port 1 View  Result Date: 03/18/2019 CLINICAL DATA:  Intubation.  Chest tube. EXAM: PORTABLE CHEST 1 VIEW COMPARISON:  03/17/2019. FINDINGS: Endotracheal tube, NG tube, mediastinal drainage tube, Swan-Ganz catheter, bilateral chest tubes in stable position. No pneumothorax. Prior CABG. Stable cardiomegaly. Progressive bibasilar atelectasis/infiltrates, particularly on the left. Consolidation left lower lobe noted. Small left pleural effusion noted on today's exam. IMPRESSION: 1. Lines and tubes including bilateral chest  tubes in stable position. No pneumothorax. 2. Progressive bibasilar atelectasis and infiltrates particularly prominent in the left base. Consolidation left lower lobe is noted. Small left pleural effusion noted on today's exam. 3.  Prior CABG.  Stable cardiomegaly. Electronically Signed   By: Marcello Moores  Register   On: 03/18/2019 06:56   [place] Ekg Site Rite  Result Date: 03/17/2019 If Site Rite image not attached, placement could not be confirmed due to current cardiac rhythm.   Medications:  Scheduled: . sodium chloride   Intravenous Once  . acetaminophen (TYLENOL) oral liquid 160 mg/5 mL  1,000 mg Per Tube Q6H  . aspirin EC  325 mg Oral Daily   Or  . aspirin  324 mg Per Tube Daily  . bisacodyl  10 mg Oral Daily   Or  . bisacodyl  10 mg Rectal Daily  . chlorhexidine gluconate (MEDLINE KIT)  15 mL Mouth Rinse BID  . Chlorhexidine Gluconate Cloth  6 each Topical Q0600  . docusate sodium  200 mg Oral Daily  . insulin aspart  0-24 Units Subcutaneous Q4H  . levalbuterol  0.63 mg Nebulization Q6H  . mouth rinse  15 mL Mouth Rinse 10 times per day  . metolazone  10 mg Oral Daily  . mupirocin  cream   Topical BID  . pantoprazole (PROTONIX) IV  40 mg Intravenous Q24H  . sodium chloride flush  3 mL Intravenous Q12H   Continuous: . sodium chloride Stopped (03/18/19 1754)  . sodium chloride    . sodium chloride 10 mL/hr at 03/19/19 0700  . amiodarone 60 mg/hr (03/19/19 0725)  . cefTRIAXone (ROCEPHIN)  IV Stopped (03/18/19 1017)  . dexmedetomidine (PRECEDEX) IV infusion Stopped (03/18/19 1908)  . epinephrine 3 mcg/min (03/19/19 0700)  . furosemide (LASIX) infusion 10 mg/hr (03/19/19 0700)  . lactated ringers 10 mL/hr at 03/19/19 0700  . lactated ringers Stopped (03/16/19 1646)  . levETIRAcetam Stopped (03/18/19 2307)  . midazolam (VERSED) infusion 60 mg/hr (03/19/19 0700)  . milrinone 0.25 mcg/kg/min (03/19/19 0700)  . norepinephrine (LEVOPHED) Adult infusion 10 mcg/min (03/19/19 0700)  .  propofol (DIPRIVAN) infusion 33 mcg/kg/min (03/19/19 0726)  . valproate sodium Stopped (03/19/19 1610)    Assessment: 73 year old female in status epilepticus, converted to GPEDs with Keppra, valproic acid and Versed gtt 1. DDx for new onset seizure with status epilepticus is broad, including toxic/metabolic/infectious, diffuse anoxic brain injury and perioperative stroke.  2. Given refractory nature of the GPEDs currently seen on EEG, they are likely due to diffuse injury of the cerebral cortices and/or thalami. DDx therefore narrows with toxic/metabolic/infectious unlikely to account for the findings and anoxic brain injury or perioperative stroke being more likely.    Recommendations: 1. ContinueVersed gtt at49m/hr and propofol at 33 mcg/kg/min. 2. Will not be able to further escalate the propofol rate on pressors as this would be detrimental to the patient from the aortic surgery/repair standpoint and is not indicated. This decision is based on discussion between Dr. ARory Percyand Dr. GServando Snareovernight. 3. Continue Keppra and valproic acid scheduled dosing 4. MRI brain would be best to assess for possible anoxic brain injury. However, she is not able to tolerate MRI currently per Cardiothoracic Surgery team. Will obtain CT head.  5. I have discussed her neurological condition with her husband by telephone this morning.    40 minutes of critical care time spent in the evaluation and management of the patient. Time spent included telephone discussion with her husband.   Addendum: CT head completed. Images reveal cortical and subcortical hypodensity, most conspicuous in the right hemisphere and generally in a watershed distribution. There is underlying periventricular and deep white matter hypodensity. Findings are concerning for hypoperfusion ischemia involving the right hemisphere. There is no definite evidence of left hemispheric involvement.   Patient's husband was updated by  telephone regarding the CT findings. All questions were answered.    LOS: 4 days   _0  signed: Dr. EKerney Elbe6/30/2020  7:30 AM

## 2019-03-19 NOTE — Progress Notes (Addendum)
T[facility] DAILY ICU PROGRESS NOTE                   [address]            [place] [address]          [phone]   4 Days Post-Op Procedure(s) (LRB): REPLACEMENT OF ASCENDING AORTA USING HEMASHIELD PLATINUM 28MM and 30MM VASCULAR GRAFT (N/A)  Total Length of Stay:  LOS: 4 days   Subjective: Remains on vent, sedated  Objective: Vital signs in last 24 hours: Temp:  [95.9 F (35.5 C)-99.1 F (37.3 C)] 99.1 F (37.3 C) (06/30 0700) Pulse Rate:  [79-149] 93 (06/30 0751) Cardiac Rhythm: Normal sinus rhythm (06/30 0400) Resp:  [13-24] 16 (06/30 0751) BP: (67-147)/(43-68) 103/43 (06/30 0751) SpO2:  [93 %-98 %] 94 % (06/30 0800) Arterial Line BP: (93-147)/(46-71) 104/46 (06/30 0700) FiO2 (%):  [60 %-90 %] 70 % (06/30 0752) Weight:  [105 kg] 105 kg (06/30 0500)  Filed Weights   03/17/19 0530 03/18/19 0230 03/19/19 0500  Weight: 97.8 kg 100.2 kg 105 kg    Weight change: 4.8 kg   Hemodynamic parameters for last 24 hours: PAP: (23-37)/(12-21) 26/16 CVP:  [8 mmHg-21 mmHg] 12 mmHg  Intake/Output from previous day: 06/29 0701 - 06/30 0700 In: 4783.1 [I.V.:4314.3; IV Piggyback:468.8] Out: 898 [Urine:538; Chest Tube:360]  Intake/Output this shift: No intake/output data recorded.  Current Meds: Scheduled Meds: . sodium chloride   Intravenous Once  . acetaminophen (TYLENOL) oral liquid 160 mg/5 mL  1,000 mg Per Tube Q6H  . aspirin EC  325 mg Oral Daily   Or  . aspirin  324 mg Per Tube Daily  . bisacodyl  10 mg Oral Daily   Or  . bisacodyl  10 mg Rectal Daily  . chlorhexidine gluconate (MEDLINE KIT)  15 mL Mouth Rinse BID  . Chlorhexidine Gluconate Cloth  6 each Topical Q0600  . docusate sodium  200 mg Oral Daily  . insulin aspart  0-24 Units Subcutaneous Q4H  . levalbuterol  0.63 mg Nebulization Q6H  . mouth rinse  15 mL Mouth Rinse 10 times per day  . metolazone  10 mg Oral Once  . mupirocin cream   Topical BID  . pantoprazole (PROTONIX) IV  40 mg  Intravenous Q24H  . sodium chloride flush  3 mL Intravenous Q12H   Continuous Infusions: . sodium chloride Stopped (03/18/19 1754)  . sodium chloride    . sodium chloride 10 mL/hr at 03/19/19 0700  . amiodarone 60 mg/hr (03/19/19 0725)  . cefTRIAXone (ROCEPHIN)  IV Stopped (03/18/19 1017)  . dexmedetomidine (PRECEDEX) IV infusion Stopped (03/18/19 1908)  . epinephrine 3 mcg/min (03/19/19 0700)  . furosemide (LASIX) infusion 10 mg/hr (03/19/19 0700)  . lactated ringers 10 mL/hr at 03/19/19 0737  . lactated ringers Stopped (03/16/19 1646)  . levETIRAcetam Stopped (03/18/19 2307)  . midazolam (VERSED) infusion 60 mg/hr (03/19/19 0700)  . milrinone 0.25 mcg/kg/min (03/19/19 0700)  . norepinephrine (LEVOPHED) Adult infusion 10 mcg/min (03/19/19 0700)  . propofol (DIPRIVAN) infusion 33 mcg/kg/min (03/19/19 0726)  . valproate sodium Stopped (03/19/19 0629)   PRN Meds:.sodium chloride, cisatracurium (NIMBEX) injection, metoprolol tartrate, midazolam, morphine injection, ondansetron (ZOFRAN) IV, oxyCODONE, sodium chloride flush, traMADol  General: She remains sedated on vent Heart: IRRR IRRR Lungs: Coarse breath sounds bilaterally Abdomen: Soft, sporadic bowel sounds Extremities: Diffuse upper extremity edema Wound: Aquacel removed and wound is clean and dry. Right axillary wound with staples intact and wound is  clean and dry  Lab Results: CBC: Recent Labs    03/18/19 0419  03/19/19 0354 03/19/19 0550  WBC 16.6*  --  15.4*  --   HGB 12.7   < > 12.8 12.2  HCT 36.2   < > 36.9 36.0  PLT 28*  --  17*  --    < > = values in this interval not displayed.   BMET:  Recent Labs    03/18/19 0419  03/19/19 0354 03/19/19 0550  NA 145   < > 142 143  K 3.6   < > 3.9 3.8  CL 110  --  107  --   CO2 24  --  21*  --   GLUCOSE 153*  --  128*  --   BUN 31*  --  42*  --   CREATININE 3.12*  --  3.69*  --   CALCIUM 8.5*  --  7.6*  --    < > = values in this interval not displayed.    CMET:  Lab Results  Component Value Date   WBC 15.4 (H) 03/19/2019   HGB 12.2 03/19/2019   HCT 36.0 03/19/2019   PLT 17 (LL) 03/19/2019   GLUCOSE 128 (H) 03/19/2019   CHOL 189 02/22/2018   TRIG 131 03/18/2019   HDL 64.20 02/22/2018   LDLCALC 101 (H) 02/22/2018   ALT 92 (H) 03/19/2019   AST 183 (H) 03/19/2019   NA 143 03/19/2019   K 3.8 03/19/2019   CL 107 03/19/2019   CREATININE 3.69 (H) 03/19/2019   BUN 42 (H) 03/19/2019   CO2 21 (L) 03/19/2019   TSH 2.77 02/22/2018   INR 1.4 (H) 03/16/2019   HGBA1C 5.7 02/22/2018      PT/INR:  Recent Labs    03/16/19 1321  LABPROT 17.3*  INR 1.4*   Radiology: Dg Chest Port 1 View  Result Date: 03/19/2019 CLINICAL DATA:  Status post aortic dissection repair EXAM: PORTABLE CHEST 1 VIEW COMPARISON:  03/18/2019 FINDINGS: Cardiac shadow is stable. Postoperative changes are again seen. Endotracheal tube is seen in satisfactory position. Gastric catheter remains in the distal esophagus and should be advanced several cm. Swan-Ganz catheter, mediastinal drain and bilateral thoracostomy catheters are again seen and stable. No evidence of pneumothorax is noted. Bibasilar infiltrative changes are noted increased from the prior exam. No bony abnormality is seen. IMPRESSION: Tubes and lines as described above. The gastric catheter remains in the distal esophagus and could be advanced several cm. Bibasilar infiltrative changes increased from the prior exam. Electronically Signed   By: Inez Catalina M.D.   On: 03/19/2019 07:23    Assessment/Plan: S/P Procedure(s) (LRB): REPLACEMENT OF ASCENDING AORTA USING HEMASHIELD PLATINUM 28MM and 30MM VASCULAR GRAFT (N/A) 1. CV-a fib mostly controlled but with intermittent RVR at times. On Amiodarone, Epinephrine, Milrinone, and nor epinephrine drips. Co ox this am decreased to 66.3 2. Pulmonary- Blood gas results noted.FIO2 gradually being weaned down (70% this am). Chest tube with 360 ml output last 24 hours (190 ml last  12 hours). CXR this am shows no pneumothorax, bibasilar infiltrative changes increase from previous exam. Gastric catheter in esophagus and could be advanced. 3. Neurology-Per neurology, GPED improved but still present. It though she has an anoxic brain injury. Continuous EEG . CT of head without contrast ordered this am to further evaluate. On Keppra 4. Acute renal failure-creatinine slightly increased to 3.69 this am. Urine output around 20 cc/ hr. On Lasix drip for volume overload and 10  mg of Zaroxolyn also ordered for today. 5. Thrombocytopenia-platelets 17,000. Not on Heparin or heparin like products but is on ecasa 325.Marland Kitchen She is to receive platelets this am. 6. ID-WBC slightly decreased to 15,400. On Ceftriaxone. Respiratory culture showed no growth 2 days   Donielle Liston Alba PA-C 03/19/2019 8:28 AM   Patient now receiving extreme sedation per neurology for seizure disorder Urine output sl improved Norepi increased due to hi dose sedation Head CT shows Prob R side ischemic stroke Will continue current care an recheck head CT in 2 days Current condition, results of CT d/w family  patient examined and medical record reviewed,agree with above note. Tharon Aquas Trigt III 03/19/2019

## 2019-03-19 NOTE — Progress Notes (Signed)
Patient was transported to CT & back to 2H22 without any complications.

## 2019-03-20 ENCOUNTER — Encounter (HOSPITAL_COMMUNITY): Payer: Self-pay

## 2019-03-20 ENCOUNTER — Inpatient Hospital Stay (HOSPITAL_COMMUNITY): Payer: Medicare Other

## 2019-03-20 DIAGNOSIS — J9601 Acute respiratory failure with hypoxia: Secondary | ICD-10-CM

## 2019-03-20 DIAGNOSIS — I7101 Dissection of thoracic aorta: Principal | ICD-10-CM

## 2019-03-20 DIAGNOSIS — G40901 Epilepsy, unspecified, not intractable, with status epilepticus: Secondary | ICD-10-CM

## 2019-03-20 DIAGNOSIS — G931 Anoxic brain damage, not elsewhere classified: Secondary | ICD-10-CM

## 2019-03-20 DIAGNOSIS — N179 Acute kidney failure, unspecified: Secondary | ICD-10-CM

## 2019-03-20 DIAGNOSIS — G9340 Encephalopathy, unspecified: Secondary | ICD-10-CM

## 2019-03-20 DIAGNOSIS — R0902 Hypoxemia: Secondary | ICD-10-CM

## 2019-03-20 LAB — COMPREHENSIVE METABOLIC PANEL
ALT: 101 U/L — ABNORMAL HIGH (ref 0–44)
AST: 191 U/L — ABNORMAL HIGH (ref 15–41)
Albumin: 2.2 g/dL — ABNORMAL LOW (ref 3.5–5.0)
Alkaline Phosphatase: 69 U/L (ref 38–126)
Anion gap: 15 (ref 5–15)
BUN: 50 mg/dL — ABNORMAL HIGH (ref 8–23)
CO2: 22 mmol/L (ref 22–32)
Calcium: 7.5 mg/dL — ABNORMAL LOW (ref 8.9–10.3)
Chloride: 105 mmol/L (ref 98–111)
Creatinine, Ser: 4.12 mg/dL — ABNORMAL HIGH (ref 0.44–1.00)
GFR calc Af Amer: 12 mL/min — ABNORMAL LOW (ref >60)
GFR calc non Af Amer: 10 mL/min — ABNORMAL LOW (ref >60)
Glucose, Bld: 107 mg/dL — ABNORMAL HIGH (ref 70–99)
Potassium: 4.1 mmol/L (ref 3.5–5.1)
Sodium: 142 mmol/L (ref 135–145)
Total Bilirubin: 1.3 mg/dL — ABNORMAL HIGH (ref 0.3–1.2)
Total Protein: 3.9 g/dL — ABNORMAL LOW (ref 6.5–8.1)

## 2019-03-20 LAB — POCT I-STAT 7, (LYTES, BLD GAS, ICA,H+H)
Acid-base deficit: 5 mmol/L — ABNORMAL HIGH (ref 0.0–2.0)
Acid-base deficit: 4 mmol/L — ABNORMAL HIGH (ref 0.0–2.0)
Acid-base deficit: 5 mmol/L — ABNORMAL HIGH (ref 0.0–2.0)
Bicarbonate: 23 mmol/L (ref 20.0–28.0)
Bicarbonate: 22.6 mmol/L (ref 20.0–28.0)
Bicarbonate: 22.8 mmol/L (ref 20.0–28.0)
Calcium, Ion: 1.07 mmol/L — ABNORMAL LOW (ref 1.15–1.40)
Calcium, Ion: 1.06 mmol/L — ABNORMAL LOW (ref 1.15–1.40)
Calcium, Ion: 1.01 mmol/L — ABNORMAL LOW (ref 1.15–1.40)
HCT: 37 % (ref 36.0–46.0)
HCT: 37 % (ref 36.0–46.0)
HCT: 40 % (ref 36.0–46.0)
Hemoglobin: 12.6 g/dL (ref 12.0–15.0)
Hemoglobin: 12.6 g/dL (ref 12.0–15.0)
Hemoglobin: 13.6 g/dL (ref 12.0–15.0)
O2 Saturation: 88 %
O2 Saturation: 84 %
O2 Saturation: 86 %
Patient temperature: 36.9
Potassium: 3.9 mmol/L (ref 3.5–5.1)
Potassium: 3.8 mmol/L (ref 3.5–5.1)
Potassium: 3.5 mmol/L (ref 3.5–5.1)
Sodium: 141 mmol/L (ref 135–145)
Sodium: 140 mmol/L (ref 135–145)
Sodium: 140 mmol/L (ref 135–145)
TCO2: 25 mmol/L (ref 22–32)
TCO2: 24 mmol/L (ref 22–32)
TCO2: 24 mmol/L (ref 22–32)
pCO2 arterial: 52 mmHg — ABNORMAL HIGH (ref 32.0–48.0)
pCO2 arterial: 47.1 mmHg (ref 32.0–48.0)
pCO2 arterial: 51.9 mmHg — ABNORMAL HIGH (ref 32.0–48.0)
pH, Arterial: 7.252 — ABNORMAL LOW (ref 7.350–7.450)
pH, Arterial: 7.289 — ABNORMAL LOW (ref 7.350–7.450)
pH, Arterial: 7.252 — ABNORMAL LOW (ref 7.350–7.450)
pO2, Arterial: 63 mmHg — ABNORMAL LOW (ref 83.0–108.0)
pO2, Arterial: 55 mmHg — ABNORMAL LOW (ref 83.0–108.0)
pO2, Arterial: 61 mmHg — ABNORMAL LOW (ref 83.0–108.0)

## 2019-03-20 LAB — PATHOLOGIST SMEAR REVIEW

## 2019-03-20 LAB — GLUCOSE, CAPILLARY
Glucose-Capillary: 100 mg/dL — ABNORMAL HIGH (ref 70–99)
Glucose-Capillary: 104 mg/dL — ABNORMAL HIGH (ref 70–99)
Glucose-Capillary: 105 mg/dL — ABNORMAL HIGH (ref 70–99)
Glucose-Capillary: 106 mg/dL — ABNORMAL HIGH (ref 70–99)
Glucose-Capillary: 114 mg/dL — ABNORMAL HIGH (ref 70–99)
Glucose-Capillary: 117 mg/dL — ABNORMAL HIGH (ref 70–99)
Glucose-Capillary: 93 mg/dL (ref 70–99)

## 2019-03-20 LAB — PREPARE PLATELET PHERESIS: Unit division: 0

## 2019-03-20 LAB — CBC
HCT: 35.1 % — ABNORMAL LOW (ref 36.0–46.0)
Hemoglobin: 11.9 g/dL — ABNORMAL LOW (ref 12.0–15.0)
MCH: 29.3 pg (ref 26.0–34.0)
MCHC: 33.9 g/dL (ref 30.0–36.0)
MCV: 86.5 fL (ref 80.0–100.0)
Platelets: 46 10*3/uL — ABNORMAL LOW (ref 150–400)
RBC: 4.06 MIL/uL (ref 3.87–5.11)
RDW: 16.9 % — ABNORMAL HIGH (ref 11.5–15.5)
WBC: 10.7 10*3/uL — ABNORMAL HIGH (ref 4.0–10.5)
nRBC: 2.8 % — ABNORMAL HIGH (ref 0.0–0.2)

## 2019-03-20 LAB — COOXEMETRY PANEL
Carboxyhemoglobin: 0.5 % (ref 0.5–1.5)
Carboxyhemoglobin: 0.6 % (ref 0.5–1.5)
Carboxyhemoglobin: 1.2 % (ref 0.5–1.5)
Methemoglobin: 1.1 % (ref 0.0–1.5)
Methemoglobin: 0.9 % (ref 0.0–1.5)
Methemoglobin: 1 % (ref 0.0–1.5)
O2 Saturation: 74.6 %
O2 Saturation: 78.5 %
O2 Saturation: 79.8 %
Total hemoglobin: 11.6 g/dL — ABNORMAL LOW (ref 12.0–16.0)
Total hemoglobin: 11.2 g/dL — ABNORMAL LOW (ref 12.0–16.0)
Total hemoglobin: 6.9 g/dL — CL (ref 12.0–16.0)

## 2019-03-20 LAB — BASIC METABOLIC PANEL
Anion gap: 18 — ABNORMAL HIGH (ref 5–15)
BUN: 54 mg/dL — ABNORMAL HIGH (ref 8–23)
CO2: 21 mmol/L — ABNORMAL LOW (ref 22–32)
Calcium: 7.3 mg/dL — ABNORMAL LOW (ref 8.9–10.3)
Chloride: 102 mmol/L (ref 98–111)
Creatinine, Ser: 4.12 mg/dL — ABNORMAL HIGH (ref 0.44–1.00)
GFR calc Af Amer: 12 mL/min — ABNORMAL LOW (ref >60)
GFR calc non Af Amer: 10 mL/min — ABNORMAL LOW (ref >60)
Glucose, Bld: 118 mg/dL — ABNORMAL HIGH (ref 70–99)
Potassium: 3.6 mmol/L (ref 3.5–5.1)
Sodium: 141 mmol/L (ref 135–145)

## 2019-03-20 LAB — PROCALCITONIN: Procalcitonin: 2.9 ng/mL

## 2019-03-20 LAB — BPAM PLATELET PHERESIS
Blood Product Expiration Date: 202007010900
ISSUE DATE / TIME: 202006300930
Unit Type and Rh: 6200

## 2019-03-20 LAB — VALPROIC ACID LEVEL: Valproic Acid Lvl: 66 ug/mL (ref 50.0–100.0)

## 2019-03-20 MED ORDER — MILRINONE LACTATE IN DEXTROSE 20-5 MG/100ML-% IV SOLN
0.1250 ug/kg/min | INTRAVENOUS | Status: DC
  Administered 2019-03-20 – 2019-03-22 (×3): 0.125 ug/kg/min via INTRAVENOUS
  Filled 2019-03-20 (×3): qty 100

## 2019-03-20 MED ORDER — VANCOMYCIN HCL 10 G IV SOLR
2000.0000 mg | Freq: Once | INTRAVENOUS | Status: AC
  Administered 2019-03-20: 2000 mg via INTRAVENOUS
  Filled 2019-03-20: qty 2000

## 2019-03-20 MED ORDER — DOCUSATE SODIUM 50 MG/5ML PO LIQD
200.0000 mg | Freq: Every day | ORAL | Status: DC
  Administered 2019-03-20 – 2019-03-22 (×3): 200 mg
  Filled 2019-03-20 (×3): qty 20

## 2019-03-20 MED ORDER — SODIUM BICARBONATE 8.4 % IV SOLN
50.0000 meq | Freq: Once | INTRAVENOUS | Status: AC
  Administered 2019-03-20: 50 meq via INTRAVENOUS
  Filled 2019-03-20: qty 50

## 2019-03-20 MED ORDER — VANCOMYCIN VARIABLE DOSE PER UNSTABLE RENAL FUNCTION (PHARMACIST DOSING): Status: DC

## 2019-03-20 MED ORDER — VITAL 1.5 CAL PO LIQD
1000.0000 mL | ORAL | Status: DC
  Administered 2019-03-20: 1000 mL
  Filled 2019-03-20: qty 1000

## 2019-03-20 MED ORDER — SODIUM CHLORIDE 0.9 % IV SOLN
1.0000 g | INTRAVENOUS | Status: AC
  Administered 2019-03-20 – 2019-03-22 (×3): 1 g via INTRAVENOUS
  Filled 2019-03-20 (×3): qty 1

## 2019-03-20 NOTE — Progress Notes (Addendum)
T[facility] DAILY ICU PROGRESS NOTE                   [address]            [place] [address]          [phone]   5 Days Post-Op Procedure(s) (LRB): REPLACEMENT OF ASCENDING AORTA USING HEMASHIELD PLATINUM 28MM and 30MM VASCULAR GRAFT (N/A)  Total Length of Stay:  LOS: 5 days   Subjective: Remains on vent, sedated  Objective: Vital signs in last 24 hours: Temp:  [96.8 F (36 C)-99.3 F (37.4 C)] 98.2 F (36.8 C) (07/01 0700) Pulse Rate:  [85-111] 111 (07/01 0700) Cardiac Rhythm: Normal sinus rhythm;Atrial fibrillation;Atrial flutter;Sinus tachycardia (07/01 0400) Resp:  [16] 16 (07/01 0440) BP: (62-105)/(38-54) 84/38 (07/01 0440) SpO2:  [85 %-96 %] 91 % (07/01 0700) Arterial Line BP: (91-122)/(32-49) 114/39 (07/01 0700) FiO2 (%):  [70 %-100 %] 100 % (07/01 0608) Weight:  [108.4 kg] 108.4 kg (07/01 0500)  Filed Weights   03/18/19 0230 03/19/19 0500 03/20/19 0500  Weight: 100.2 kg 105 kg 108.4 kg    Weight change: 3.4 kg   Hemodynamic parameters for last 24 hours: PAP: (23-32)/(15-27) 27/20 CVP:  [11 mmHg-21 mmHg] 16 mmHg CO:  [5.9 L/min] 5.9 L/min CI:  [3 L/min/m2-3.1 L/min/m2] 3 L/min/m2  Intake/Output from previous day: 06/30 0701 - 07/01 0700 In: 4900.6 [I.V.:3872.4; Blood:560; NG/GT:70; IV Piggyback:398.1] Out: 8756 [Urine:1085; Chest Tube:280]  Intake/Output this shift: No intake/output data recorded.  Current Meds: Scheduled Meds: . sodium chloride   Intravenous Once  . acetaminophen (TYLENOL) oral liquid 160 mg/5 mL  1,000 mg Per Tube Q6H  . bisacodyl  10 mg Oral Daily   Or  . bisacodyl  10 mg Rectal Daily  . chlorhexidine gluconate (MEDLINE KIT)  15 mL Mouth Rinse BID  . Chlorhexidine Gluconate Cloth  6 each Topical Q0600  . docusate sodium  200 mg Oral Daily  . insulin aspart  0-24 Units Subcutaneous Q4H  . levalbuterol  0.63 mg Nebulization Q6H  . mouth rinse  15 mL Mouth Rinse 10 times per day  . mupirocin cream   Topical BID   . pantoprazole (PROTONIX) IV  40 mg Intravenous Q24H  . sodium chloride flush  3 mL Intravenous Q12H   Continuous Infusions: . sodium chloride Stopped (03/18/19 1754)  . sodium chloride    . sodium chloride Stopped (03/20/19 4332)  . amiodarone 30 mg/hr (03/20/19 0700)  . cefTRIAXone (ROCEPHIN)  IV Stopped (03/19/19 1146)  . dextrose 20 mL/hr at 03/20/19 0700  . epinephrine 3 mcg/min (03/20/19 0700)  . furosemide (LASIX) infusion 10 mg/hr (03/20/19 0700)  . lactated ringers Stopped (03/20/19 0541)  . lactated ringers 10 mL/hr at 03/20/19 0700  . levETIRAcetam Stopped (03/19/19 2236)  . midazolam (VERSED) infusion 60 mg/hr (03/20/19 0700)  . milrinone 0.25 mcg/kg/min (03/20/19 0700)  . norepinephrine (LEVOPHED) Adult infusion 8 mcg/min (03/20/19 0700)  . propofol (DIPRIVAN) infusion 25 mcg/kg/min (03/20/19 0700)  . valproate sodium 55 mL/hr at 03/20/19 0700   PRN Meds:.sodium chloride, metoprolol tartrate, midazolam, morphine injection, ondansetron (ZOFRAN) IV, oxyCODONE, sodium chloride flush, traMADol  General: She remains sedated on vent Heart: Tachycardic Lungs: Coarse breath sounds bilaterally Abdomen: Soft, sporadic bowel sounds Extremities: Diffuse upper extremity edema. Minor mottling of both tips of toes Wound: Sternal wound is clean and dry. Right axillary wound with staples intact and wound is clean and dry  Lab Results: CBC: Recent Labs  03/19/19 1815  03/20/19 0420 03/20/19 0548  WBC 13.2*  --  10.7*  --   HGB 11.7*   < > 11.9* 12.6  HCT 34.9*   < > 35.1* 37.0  PLT 46*  --  46*  --    < > = values in this interval not displayed.   BMET:  Recent Labs    03/19/19 1815  03/20/19 0420 03/20/19 0548  NA 142   < > 142 141  K 4.0   < > 4.1 3.9  CL 106  --  105  --   CO2 22  --  22  --   GLUCOSE 121*  --  107*  --   BUN 45*  --  50*  --   CREATININE 3.91*  --  4.12*  --   CALCIUM 7.5*  --  7.5*  --    < > = values in this interval not displayed.     CMET: Lab Results  Component Value Date   WBC 10.7 (H) 03/20/2019   HGB 12.6 03/20/2019   HCT 37.0 03/20/2019   PLT 46 (L) 03/20/2019   GLUCOSE 107 (H) 03/20/2019   CHOL 189 02/22/2018   TRIG 131 03/18/2019   HDL 64.20 02/22/2018   LDLCALC 101 (H) 02/22/2018   ALT 101 (H) 03/20/2019   AST 191 (H) 03/20/2019   NA 141 03/20/2019   K 3.9 03/20/2019   CL 105 03/20/2019   CREATININE 4.12 (H) 03/20/2019   BUN 50 (H) 03/20/2019   CO2 22 03/20/2019   TSH 2.77 02/22/2018   INR 1.4 (H) 03/16/2019   HGBA1C 5.7 02/22/2018    PT/INR:  No results for input(s): LABPROT, INR in the last 72 hours. Radiology: Ct Head Wo Contrast  Result Date: 03/19/2019 CLINICAL DATA:  Focal neuro deficit, anoxic brain injury, stroke suspected EXAM: CT HEAD WITHOUT CONTRAST TECHNIQUE: Contiguous axial images were obtained from the base of the skull through the vertex without intravenous contrast. COMPARISON:  MR brain, 02/04/2017 FINDINGS: Brain: Evaluation of the brain parenchyma is somewhat limited by overlying monitoring lead streak artifact. There is cortical and subcortical hypodensity, most conspicuous in the right hemisphere and generally in a watershed distribution (series 3, image 18, 23). There is underlying periventricular and deep white matter hypodensity. Vascular: No hyperdense vessel or unexpected calcification. Skull: Normal. Negative for fracture or focal lesion. Sinuses/Orbits: No acute finding. Other: None. IMPRESSION: Evaluation of the brain parenchyma is somewhat limited by overlying monitoring lead streak artifact. There is cortical and subcortical hypodensity, most conspicuous in the right hemisphere and generally in a watershed distribution (series 3, image 18, 23). There is underlying periventricular and deep white matter hypodensity. Findings are concerning for hypoperfusion ischemia involving the right hemisphere. There is no definite evidence of left hemispheric involvement. MRI may be used to  more sensitively evaluate for acute diffusion restricting infarction if desired. Electronically Signed   By: Eddie Candle M.D.   On: 03/19/2019 11:13    Assessment/Plan: S/P Procedure(s) (LRB): REPLACEMENT OF ASCENDING AORTA USING HEMASHIELD PLATINUM 28MM and 30MM VASCULAR GRAFT (N/A) 1. CV-Previous a fib;converted to SR yesterday. ST in the low 100's this am. On Amiodarone, Epinephrine at 3, Milrinone at 0.125, and Nor epinephrine drips. Co ox this am increased to 73.3 (and Milrinone was decreased yesterday). As discussed with Dr. Prescott Gum, will increase Nor epi to 15 and decrease Versed by half to help with hypotension. 2. Pulmonary- Most lood gas results noted (PH=7.225,PCO2 53, PO2 65) .  Around 3-4 am, peep up to 10 and rate increased to 22. FIO2 100% this am. CCM to management vent. Chest tubes with 322 ml output last 24 hours (140 ml last 12 hours). CXR this am appears to show patient very rotated to the right and bibasilar opacities 3. Neurology-Per neurology, continuous EEG . CT of head done previously showed watershed distribution and concern for hypoperfusion ischemia involving the right hemisphere. On Keppra and Valproate 4. Acute renal failure-creatinine slightly decreased to 4.02  this am. Urine output around 80 cc/ hr. On Lasix drip for volume overload at 15 ml/hr 5. Thrombocytopenia-platelets slightly decreased to 41,000 and she did receive platelets previously. Not on Heparin or heparin like products but is on ecasa 325..  6. ID-WBC slightly decreased to 10,400. On Vancomycin and Cefepime.  Respiratory culture showed no growth 2 days 7. Elevated transaminases-AST 222, ALT 105 likely related to shock 8. Bactroban as Staph Aureus on surgical screen 9. CBGs 106/104/105. -She previously had persistent hypoglycemia yesterday so put on D5 W which has helped 10. Neurologic status to be further determined. Hopefully, lighten sedation and may need follow up CT head.  Johnnetta Holstine Liston Alba  PA-C 03/20/2019 7:46 AM

## 2019-03-20 NOTE — Plan of Care (Signed)
  Problem: Education: °Goal: Knowledge of General Education information will improve °Description: Including pain rating scale, medication(s)/side effects and non-pharmacologic comfort measures °Outcome: Not Progressing °  °Problem: Health Behavior/Discharge Planning: °Goal: Ability to manage health-related needs will improve °Outcome: Not Progressing °  °Problem: Clinical Measurements: °Goal: Ability to maintain clinical measurements within normal limits will improve °Outcome: Not Progressing °Goal: Will remain free from infection °Outcome: Not Progressing °Goal: Diagnostic test results will improve °Outcome: Not Progressing °Goal: Respiratory complications will improve °Outcome: Not Progressing °Goal: Cardiovascular complication will be avoided °Outcome: Not Progressing °  °Problem: Activity: °Goal: Risk for activity intolerance will decrease °Outcome: Not Progressing °  °Problem: Nutrition: °Goal: Adequate nutrition will be maintained °Outcome: Not Progressing °  °Problem: Coping: °Goal: Level of anxiety will decrease °Outcome: Not Progressing °  °Problem: Elimination: °Goal: Will not experience complications related to bowel motility °Outcome: Not Progressing °Goal: Will not experience complications related to urinary retention °Outcome: Not Progressing °  °Problem: Pain Managment: °Goal: General experience of comfort will improve °Outcome: Not Progressing °  °Problem: Safety: °Goal: Ability to remain free from injury will improve °Outcome: Not Progressing °  °Problem: Skin Integrity: °Goal: Risk for impaired skin integrity will decrease °Outcome: Not Progressing °  °Problem: Education: °Goal: Will demonstrate proper wound care and an understanding of methods to prevent future damage °Outcome: Not Progressing °Goal: Knowledge of disease or condition will improve °Outcome: Not Progressing °Goal: Knowledge of the prescribed therapeutic regimen will improve °Outcome: Not Progressing °Goal: Individualized  Educational Video(s) °Outcome: Not Progressing °  °Problem: Activity: °Goal: Risk for activity intolerance will decrease °Outcome: Not Progressing °  °Problem: Cardiac: °Goal: Will achieve and/or maintain hemodynamic stability °Outcome: Not Progressing °  °Problem: Clinical Measurements: °Goal: Postoperative complications will be avoided or minimized °Outcome: Not Progressing °  °Problem: Respiratory: °Goal: Respiratory status will improve °Outcome: Not Progressing °  °Problem: Skin Integrity: °Goal: Wound healing without signs and symptoms of infection °Outcome: Not Progressing °Goal: Risk for impaired skin integrity will decrease °Outcome: Not Progressing °  °Problem: Urinary Elimination: °Goal: Ability to achieve and maintain adequate renal perfusion and functioning will improve °Outcome: Not Progressing °  °

## 2019-03-20 NOTE — Procedures (Signed)
  Electroencephalogram report- LTM with VIDEO   Beginning time: 03/19/19 at 0730 Ending time:  7/1//20 at 0730 CPT/type : 95720  Day of study: day 2   Technical Description: The EEG was performed using standard setting per the guidelines of American Clinical Neurophysiology Society (ACNS).    A minimum of 21 electrodes were placed on scalp according to the International 10-20 or/and 10-10 Systems. Supplemental electrodes were placed as needed. Single EKG electrode was also used to detect cardiac arrhythmia. Patient's behavior was continuously recorded on video simultaneously with EEG. A minimum of 16 channels were used for data display. Each epoch of study was reviewed manually daily and as needed using standard referential and bipolar montages. Computerized quantitative EEG analysis (such as compressed spectral array analysis, trending, automated spike & seizure detection) were used as indicated.    Spike detection: ON  Seizure detection: ON   This  continuous  EEG monitoring with simultaneous video monitoring was performed for this patient with  NCSE to ensure resolution of seizures.  Day 1 : This recording begins background activities marked by nonreactive attenuated slowing with superimposed continuous to 2.5 cps broadly distributed spike and wave discharges maximum negativity left frontocentral cortex consistent with nonconvulsive status epilepticus.  With medication adjustment as recording continuous background activities evolve into suppressed nonreactive background activities with superimposed predominantly left frontocentral sharp waves and spike and wave discharges was brought negative field at times to given appearance of generalized spike and wave discharges.  At times such broad epileptiform discharges appear in short 3 to 5 seconds along evolving trains concerning for brief electrographic seizures.  Day 2: Burst suppression pattern was achieved during second half of the recording.   At this point ratio 3/7 sec respectively.  Left anterior frontal sharp waves and spikes present within burst of cerebral activities.  No seizures.  Clinical interpretation: This day 2 of continuous video EEG monitoring demonstrated resolution of seizures.  Burst suppression pattern was achieved.  Left anterior frontal sharp waves and spikes still present and suggestive of cortical irritability in that region.  Continuous monitoring is recommended to ensure stability and resolution of seizures.  Clinical correlation is advised.

## 2019-03-20 NOTE — Progress Notes (Signed)
Dr. Prescott Gum at bedside to assess pt. MD increased lasix gtt rate to 15mg /hr and amiodarone gtt to 60mg /hr. MD also stated to leave OGT to continuous suction at this time. MD stated to d/c PICC placement order at this time.

## 2019-03-20 NOTE — Progress Notes (Signed)
EEG LTM maintenance complete. No skin breakdown. Continue to monitor

## 2019-03-20 NOTE — Progress Notes (Signed)
CT surgery pm rounds  Back in nsr Better urine output, creat plateau at 4.1 Plan to DC sedation in am Called family and reviewed the patrients condition and plan of care  P Prescott Gum MD

## 2019-03-20 NOTE — Progress Notes (Signed)
Subjective: Sedated with Versed and propofol.  Objective: Current vital signs: BP (!) 84/38   Pulse (!) 111   Temp 98.2 F (36.8 C)   Resp 16   Ht _0  (1.727 m)   Wt 108.4 kg   SpO2 91%   BMI 36.34 kg/m  Vital signs in last 24 hours: Temp:  [96.8 F (36 C)-99.3 F (37.4 C)] 98.2 F (36.8 C) (07/01 0700) Pulse Rate:  [85-111] 111 (07/01 0700) Resp:  [16] 16 (07/01 0440) BP: (62-105)/(38-54) 84/38 (07/01 0440) SpO2:  [85 %-96 %] 91 % (07/01 0700) Arterial Line BP: (91-122)/(32-49) 114/39 (07/01 0700) FiO2 (%):  [70 %-100 %] 100 % (07/01 0608) Weight:  [108.4 kg] 108.4 kg (07/01 0500)  Intake/Output from previous day: 06/30 0701 - 07/01 0700 In: 4900.6 [I.V.:3872.4; Blood:560; NG/GT:70; IV Piggyback:398.1] Out: 1365 [Urine:1085; Chest Tube:280] Intake/Output this shift: No intake/output data recorded. Nutritional status:  Diet Order            Diet NPO time specified  Diet effective now              Neurologic Exam: Sedated on Versed at rate of 60 and propofol at 25 Ment: No responses to any external stimuli.  CN: Pupils 2.5 mm and sluggishly reactive. No blink to threat. No oculocephalic reflex. Face flaccidly symmetric (sedated) Motor/Sensory: Flaccid tone x 4 with no movement to noxious stimuli (sedated).  Reflexes: Areflexic x 4 (sedated)  Lab Results: Results for orders placed or performed during the hospital encounter of 02/21/2019 (from the past 48 hour(s))  Glucose, capillary     Status: Abnormal   Collection Time: 03/18/19  7:43 AM  Result Value Ref Range   Glucose-Capillary 138 (H) 70 - 99 mg/dL  Glucose, capillary     Status: Abnormal   Collection Time: 03/18/19 12:15 PM  Result Value Ref Range   Glucose-Capillary 117 (H) 70 - 99 mg/dL  Glucose, capillary     Status: Abnormal   Collection Time: 03/18/19  3:39 PM  Result Value Ref Range   Glucose-Capillary 111 (H) 70 - 99 mg/dL  I-STAT 7, (LYTES, BLD GAS, ICA, H+H)     Status: Abnormal    Collection Time: 03/18/19  4:46 PM  Result Value Ref Range   pH, Arterial 7.411 7.350 - 7.450   pCO2 arterial 35.8 32.0 - 48.0 mmHg   pO2, Arterial 74.0 (L) 83.0 - 108.0 mmHg   Bicarbonate 22.9 20.0 - 28.0 mmol/L   TCO2 24 22 - 32 mmol/L   O2 Saturation 96.0 %   Acid-base deficit 2.0 0.0 - 2.0 mmol/L   Sodium 143 135 - 145 mmol/L   Potassium 3.8 3.5 - 5.1 mmol/L   Calcium, Ion 1.14 (L) 1.15 - 1.40 mmol/L   HCT 35.0 (L) 36.0 - 46.0 %   Hemoglobin 11.9 (L) 12.0 - 15.0 g/dL   Patient temperature 36.2 C    Collection site ARTERIAL LINE    Drawn by Operator    Sample type ARTERIAL   .Cooxemetry Panel (carboxy, met, total hgb, O2 sat)     Status: None   Collection Time: 03/18/19  4:50 PM  Result Value Ref Range   Total hemoglobin 12.6 12.0 - 16.0 g/dL   O2 Saturation 78.6 %   Carboxyhemoglobin 0.7 0.5 - 1.5 %   Methemoglobin 0.8 0.0 - 1.5 %  Glucose, capillary     Status: Abnormal   Collection Time: 03/18/19  8:12 PM  Result Value Ref Range   Glucose-Capillary  103 (H) 70 - 99 mg/dL  Triglycerides     Status: None   Collection Time: 03/18/19 11:10 PM  Result Value Ref Range   Triglycerides 131 <150 mg/dL    Comment: Performed at [facility] [address], [place], [place] [address]  Glucose, capillary     Status: Abnormal   Collection Time: 03/18/19 11:13 PM  Result Value Ref Range   Glucose-Capillary 124 (H) 70 - 99 mg/dL  Comprehensive metabolic panel     Status: Abnormal   Collection Time: 03/19/19  3:54 AM  Result Value Ref Range   Sodium 142 135 - 145 mmol/L   Potassium 3.9 3.5 - 5.1 mmol/L   Chloride 107 98 - 111 mmol/L   CO2 21 (L) 22 - 32 mmol/L   Glucose, Bld 128 (H) 70 - 99 mg/dL   BUN 42 (H) 8 - 23 mg/dL   Creatinine, Ser 3.69 (H) 0.44 - 1.00 mg/dL   Calcium 7.6 (L) 8.9 - 10.3 mg/dL   Total Protein 3.7 (L) 6.5 - 8.1 g/dL   Albumin 2.4 (L) 3.5 - 5.0 g/dL   AST 183 (H) 15 - 41 U/L   ALT 92 (H) 0 - 44 U/L   Alkaline Phosphatase 51 38 - 126 U/L   Total  Bilirubin 2.0 (H) 0.3 - 1.2 mg/dL   GFR calc non Af Amer 12 (L) >60 mL/min   GFR calc Af Amer 13 (L) >60 mL/min   Anion gap 14 5 - 15    Comment: Performed at [facility], [place] [address], [place], [place] [address]  CBC     Status: Abnormal   Collection Time: 03/19/19  3:54 AM  Result Value Ref Range   WBC 15.4 (H) 4.0 - 10.5 K/uL   RBC 4.40 3.87 - 5.11 MIL/uL   Hemoglobin 12.8 12.0 - 15.0 g/dL   HCT 36.9 36.0 - 46.0 %   MCV 83.9 80.0 - 100.0 fL   MCH 29.1 26.0 - 34.0 pg   MCHC 34.7 30.0 - 36.0 g/dL   RDW 15.5 11.5 - 15.5 %   Platelets 17 (LL) 150 - 400 K/uL    Comment: REPEATED TO VERIFY Immature Platelet Fraction may be clinically indicated, consider ordering this additional test [id number] CRITICAL VALUE NOTED.  VALUE IS CONSISTENT WITH PREVIOUSLY REPORTED AND CALLED VALUE.    nRBC 0.7 (H) 0.0 - 0.2 %    Comment: Performed at [facility], [place] [address], [place], [place] [address]  Glucose, capillary     Status: Abnormal   Collection Time: 03/19/19  3:58 AM  Result Value Ref Range   Glucose-Capillary 123 (H) 70 - 99 mg/dL  .Cooxemetry Panel (carboxy, met, total hgb, O2 sat)     Status: Abnormal   Collection Time: 03/19/19  4:05 AM  Result Value Ref Range   Total hemoglobin 11.9 (L) 12.0 - 16.0 g/dL   O2 Saturation 66.3 %   Carboxyhemoglobin 0.7 0.5 - 1.5 %   Methemoglobin 0.9 0.0 - 1.5 %  I-STAT 7, (LYTES, BLD GAS, ICA, H+H)     Status: Abnormal   Collection Time: 03/19/19  5:50 AM  Result Value Ref Range   pH, Arterial 7.355 7.350 - 7.450   pCO2 arterial 38.6 32.0 - 48.0 mmHg   pO2, Arterial 71.0 (L) 83.0 - 108.0 mmHg   Bicarbonate 21.5 20.0 - 28.0 mmol/L   TCO2 23 22 - 32 mmol/L   O2 Saturation 93.0 %  Acid-base deficit 4.0 (H) 0.0 - 2.0 mmol/L   Sodium 143 135 - 145 mmol/L   Potassium 3.8 3.5 - 5.1 mmol/L   Calcium, Ion 1.09 (L) 1.15 - 1.40 mmol/L   HCT 36.0 36.0 - 46.0 %   Hemoglobin 12.2 12.0 - 15.0 g/dL   Patient temperature 37.0 C    Sample  type ARTERIAL   Prepare Pheresed Platelets     Status: None (Preliminary result)   Collection Time: 03/19/19  7:55 AM  Result Value Ref Range   Unit Number [id number]    Blood Component Type PLTP LR1 PAS    Unit division 00    Status of Unit ISSUED    Transfusion Status      OK TO TRANSFUSE Performed at [facility], [address], [place], [place] [address]   Glucose, capillary     Status: None   Collection Time: 03/19/19  7:55 AM  Result Value Ref Range   Glucose-Capillary 91 70 - 99 mg/dL  Glucose, capillary     Status: None   Collection Time: 03/19/19 11:26 AM  Result Value Ref Range   Glucose-Capillary 77 70 - 99 mg/dL  Glucose, capillary     Status: Abnormal   Collection Time: 03/19/19  1:49 PM  Result Value Ref Range   Glucose-Capillary 67 (L) 70 - 99 mg/dL  Glucose, capillary     Status: None   Collection Time: 03/19/19  3:52 PM  Result Value Ref Range   Glucose-Capillary 78 70 - 99 mg/dL  .Cooxemetry Panel (carboxy, met, total hgb, O2 sat)     Status: Abnormal   Collection Time: 03/19/19  4:40 PM  Result Value Ref Range   Total hemoglobin 11.7 (L) 12.0 - 16.0 g/dL   O2 Saturation 81.4 %   Carboxyhemoglobin 0.6 0.5 - 1.5 %   Methemoglobin 1.0 0.0 - 1.5 %  I-STAT 7, (LYTES, BLD GAS, ICA, H+H)     Status: Abnormal   Collection Time: 03/19/19  4:56 PM  Result Value Ref Range   pH, Arterial 7.302 (L) 7.350 - 7.450   pCO2 arterial 40.8 32.0 - 48.0 mmHg   pO2, Arterial 62.0 (L) 83.0 - 108.0 mmHg   Bicarbonate 20.4 20.0 - 28.0 mmol/L   TCO2 22 22 - 32 mmol/L   O2 Saturation 90.0 %   Acid-base deficit 6.0 (H) 0.0 - 2.0 mmol/L   Sodium 142 135 - 145 mmol/L   Potassium 3.5 3.5 - 5.1 mmol/L   Calcium, Ion 1.03 (L) 1.15 - 1.40 mmol/L   HCT 34.0 (L) 36.0 - 46.0 %   Hemoglobin 11.6 (L) 12.0 - 15.0 g/dL   Patient temperature 36.2 C    Sample type ARTERIAL   CBC     Status: Abnormal   Collection Time: 03/19/19  6:15 PM  Result Value Ref Range   WBC 13.2 (H)  4.0 - 10.5 K/uL   RBC 4.01 3.87 - 5.11 MIL/uL   Hemoglobin 11.7 (L) 12.0 - 15.0 g/dL   HCT 34.9 (L) 36.0 - 46.0 %   MCV 87.0 80.0 - 100.0 fL   MCH 29.2 26.0 - 34.0 pg   MCHC 33.5 30.0 - 36.0 g/dL   RDW 16.5 (H) 11.5 - 15.5 %   Platelets 46 (L) 150 - 400 K/uL    Comment: REPEATED TO VERIFY PLATELET COUNT CONFIRMED BY SMEAR SPECIMEN CHECKED FOR CLOTS Immature Platelet Fraction may be clinically indicated, consider ordering this additional test [id number]    nRBC 1.2 (  H) 0.0 - 0.2 %    Comment: Performed at [facility], [place] [address] [place], [place] [address]  Basic metabolic panel     Status: Abnormal   Collection Time: 03/19/19  6:15 PM  Result Value Ref Range   Sodium 142 135 - 145 mmol/L   Potassium 4.0 3.5 - 5.1 mmol/L   Chloride 106 98 - 111 mmol/L   CO2 22 22 - 32 mmol/L   Glucose, Bld 121 (H) 70 - 99 mg/dL   BUN 45 (H) 8 - 23 mg/dL   Creatinine, Ser 3.91 (H) 0.44 - 1.00 mg/dL   Calcium 7.5 (L) 8.9 - 10.3 mg/dL   GFR calc non Af Amer 11 (L) >60 mL/min   GFR calc Af Amer 12 (L) >60 mL/min   Anion gap 14 5 - 15    Comment: Performed at [facility] [address], [place], [place] [address]  Glucose, capillary     Status: Abnormal   Collection Time: 03/19/19  7:58 PM  Result Value Ref Range   Glucose-Capillary 115 (H) 70 - 99 mg/dL  I-STAT 7, (LYTES, BLD GAS, ICA, H+H)     Status: Abnormal   Collection Time: 03/19/19  8:01 PM  Result Value Ref Range   pH, Arterial 7.295 (L) 7.350 - 7.450   pCO2 arterial 44.5 32.0 - 48.0 mmHg   pO2, Arterial 62.0 (L) 83.0 - 108.0 mmHg   Bicarbonate 21.9 20.0 - 28.0 mmol/L   TCO2 23 22 - 32 mmol/L   O2 Saturation 90.0 %   Acid-base deficit 5.0 (H) 0.0 - 2.0 mmol/L   Sodium 142 135 - 145 mmol/L   Potassium 3.7 3.5 - 5.1 mmol/L   Calcium, Ion 1.05 (L) 1.15 - 1.40 mmol/L   HCT 37.0 36.0 - 46.0 %   Hemoglobin 12.6 12.0 - 15.0 g/dL   Patient temperature 36.0 C    Sample type ARTERIAL   Glucose, capillary     Status:  Abnormal   Collection Time: 03/20/19 12:36 AM  Result Value Ref Range   Glucose-Capillary 117 (H) 70 - 99 mg/dL  Comprehensive metabolic panel     Status: Abnormal   Collection Time: 03/20/19  4:20 AM  Result Value Ref Range   Sodium 142 135 - 145 mmol/L   Potassium 4.1 3.5 - 5.1 mmol/L   Chloride 105 98 - 111 mmol/L   CO2 22 22 - 32 mmol/L   Glucose, Bld 107 (H) 70 - 99 mg/dL   BUN 50 (H) 8 - 23 mg/dL   Creatinine, Ser 4.12 (H) 0.44 - 1.00 mg/dL   Calcium 7.5 (L) 8.9 - 10.3 mg/dL   Total Protein 3.9 (L) 6.5 - 8.1 g/dL   Albumin 2.2 (L) 3.5 - 5.0 g/dL   AST 191 (H) 15 - 41 U/L   ALT 101 (H) 0 - 44 U/L   Alkaline Phosphatase 69 38 - 126 U/L   Total Bilirubin 1.3 (H) 0.3 - 1.2 mg/dL   GFR calc non Af Amer 10 (L) >60 mL/min   GFR calc Af Amer 12 (L) >60 mL/min   Anion gap 15 5 - 15    Comment: Performed at [facility], [place] [address], [place] [address]  CBC     Status: Abnormal   Collection Time: 03/20/19  4:20 AM  Result Value Ref Range   WBC 10.7 (H) 4.0 - 10.5 K/uL   RBC 4.06 3.87 - 5.11 MIL/uL  Hemoglobin 11.9 (L) 12.0 - 15.0 g/dL   HCT 35.1 (L) 36.0 - 46.0 %   MCV 86.5 80.0 - 100.0 fL   MCH 29.3 26.0 - 34.0 pg   MCHC 33.9 30.0 - 36.0 g/dL   RDW 16.9 (H) 11.5 - 15.5 %   Platelets 46 (L) 150 - 400 K/uL    Comment: REPEATED TO VERIFY Immature Platelet Fraction may be clinically indicated, consider ordering this additional test [id number] CONSISTENT WITH PREVIOUS RESULT    nRBC 2.8 (H) 0.0 - 0.2 %    Comment: Performed at [facility], [place] [address], [place], [place] [address]  Glucose, capillary     Status: None   Collection Time: 03/20/19  4:24 AM  Result Value Ref Range   Glucose-Capillary 93 70 - 99 mg/dL  I-STAT 7, (LYTES, BLD GAS, ICA, H+H)     Status: Abnormal   Collection Time: 03/20/19  5:48 AM  Result Value Ref Range   pH, Arterial 7.252 (L) 7.350 - 7.450   pCO2 arterial 52.0 (H) 32.0 - 48.0 mmHg   pO2, Arterial 63.0 (L) 83.0 -  108.0 mmHg   Bicarbonate 23.0 20.0 - 28.0 mmol/L   TCO2 25 22 - 32 mmol/L   O2 Saturation 88.0 %   Acid-base deficit 5.0 (H) 0.0 - 2.0 mmol/L   Sodium 141 135 - 145 mmol/L   Potassium 3.9 3.5 - 5.1 mmol/L   Calcium, Ion 1.07 (L) 1.15 - 1.40 mmol/L   HCT 37.0 36.0 - 46.0 %   Hemoglobin 12.6 12.0 - 15.0 g/dL   Patient temperature 36.9 C    Sample type ARTERIAL   .Cooxemetry Panel (carboxy, met, total hgb, O2 sat)     Status: Abnormal   Collection Time: 03/20/19  5:59 AM  Result Value Ref Range   Total hemoglobin 11.6 (L) 12.0 - 16.0 g/dL   O2 Saturation 74.6 %   Carboxyhemoglobin 0.5 0.5 - 1.5 %   Methemoglobin 1.1 0.0 - 1.5 %    Recent Results (from the past 240 hour(s))  SARS Coronavirus 2 (Hosp order,Performed in [facility] lab via [employer] ID)     Status: None   Collection Time: 02/20/2019  6:04 PM   Specimen: Dry Nasal Swab ([employer] ID Now)  Result Value Ref Range Status   SARS Coronavirus 2 ([employer] ID Now) NEGATIVE NEGATIVE Final    Comment: (NOTE) Interpretive Result Comment(s): COVID 19 Positive SARS CoV 2 target nucleic acids are DETECTED. The SARS CoV 2 RNA is generally detectable in upper and lower respiratory specimens during the acute phase of infection.  Positive results are indicative of active infection with SARS CoV 2.  Clinical correlation with patient history and other diagnostic information is necessary to determine patient infection status.  Positive results do not rule out bacterial infection or coinfection with other viruses. The expected result is Negative. COVID 19 Negative SARS CoV 2 target nucleic acids are NOT DETECTED. The SARS CoV 2 RNA is generally detectable in upper and lower respiratory specimens during the acute phase of infection.  Negative results do not preclude SARS CoV 2 infection, do not rule out coinfections with other pathogens, and should not be used as the sole basis for treatment or other patient management decisions.   Negative results must be combined with clinical  observations, patient history, and epidemiological information. The expected result is Negative. Invalid Presence or absence of SARS CoV 2 nucleic acids cannot be determined. Repeat testing was performed on the submitted  specimen and repeated Invalid results were obtained.  If clinically indicated, additional testing on a new specimen with an alternate test methodology [phone]) is advised.  The SARS CoV 2 RNA is generally detectable in upper and lower respiratory specimens during the acute phase of infection. The expected result is Negative. Fact Sheet for Patients:  GolfingFamily.no Fact Sheet for Healthcare Providers: https://www.hernandez-brewer.com/ This test is not yet approved or cleared by the [place] FDA and has been authorized for detection and/or diagnosis of SARS CoV 2 by FDA under an Emergency Use Authorization (EUA).  This EUA will remain in effect (meaning this test can be used) for the duration of the COVID19 d eclaration under Section 564(b)(1) of the Act, 21 U.S.C. section [phone] 3(b)(1), unless the authorization is terminated or revoked sooner. Performed at [facility], [address] [place], [place] [address]   Culture, respiratory (non-expectorated)     Status: None   Collection Time: 03/17/19 11:37 AM   Specimen: Tracheal Aspirate; Respiratory  Result Value Ref Range Status   Specimen Description TRACHEAL ASPIRATE  Final   Special Requests Normal  Final   Gram Stain   Final    FEW WBC PRESENT, PREDOMINANTLY MONONUCLEAR NO ORGANISMS SEEN    Culture   Final    NO GROWTH 2 DAYS Performed at [facility], [address], [place], [place] [address]    Report Status 03/19/2019 FINAL  Final  Surgical pcr screen     Status: Abnormal   Collection Time: 03/18/19  5:09 AM   Specimen: Nasal Mucosa; Nasal Swab  Result Value Ref Range Status   MRSA, PCR  NEGATIVE NEGATIVE Final   Staphylococcus aureus POSITIVE (A) NEGATIVE Final    Comment: (NOTE) The Xpert SA Assay (FDA approved for NASAL specimens in patients 74 years of age and older), is one component of a comprehensive surveillance program. It is not intended to diagnose infection nor to guide or monitor treatment. Performed at [facility], [place] [address] [place], [place] [address]     Lipid Panel Recent Labs    03/18/19 2310  TRIG 131    Studies/Results: Ct Head Wo Contrast  Result Date: 03/19/2019 CLINICAL DATA:  Focal neuro deficit, anoxic brain injury, stroke suspected EXAM: CT HEAD WITHOUT CONTRAST TECHNIQUE: Contiguous axial images were obtained from the base of the skull through the vertex without intravenous contrast. COMPARISON:  MR brain, 02/04/2017 FINDINGS: Brain: Evaluation of the brain parenchyma is somewhat limited by overlying monitoring lead streak artifact. There is cortical and subcortical hypodensity, most conspicuous in the right hemisphere and generally in a watershed distribution (series 3, image 18, 23). There is underlying periventricular and deep white matter hypodensity. Vascular: No hyperdense vessel or unexpected calcification. Skull: Normal. Negative for fracture or focal lesion. Sinuses/Orbits: No acute finding. Other: None. IMPRESSION: Evaluation of the brain parenchyma is somewhat limited by overlying monitoring lead streak artifact. There is cortical and subcortical hypodensity, most conspicuous in the right hemisphere and generally in a watershed distribution (series 3, image 18, 23). There is underlying periventricular and deep white matter hypodensity. Findings are concerning for hypoperfusion ischemia involving the right hemisphere. There is no definite evidence of left hemispheric involvement. MRI may be used to more sensitively evaluate for acute diffusion restricting infarction if desired. Electronically Signed   By: Eddie Candle M.D.   On:  03/19/2019 11:13   Dg Chest Port 1 View  Result Date: 03/19/2019 CLINICAL DATA:  Status post  aortic dissection repair EXAM: PORTABLE CHEST 1 VIEW COMPARISON:  03/18/2019 FINDINGS: Cardiac shadow is stable. Postoperative changes are again seen. Endotracheal tube is seen in satisfactory position. Gastric catheter remains in the distal esophagus and should be advanced several cm. Swan-Ganz catheter, mediastinal drain and bilateral thoracostomy catheters are again seen and stable. No evidence of pneumothorax is noted. Bibasilar infiltrative changes are noted increased from the prior exam. No bony abnormality is seen. IMPRESSION: Tubes and lines as described above. The gastric catheter remains in the distal esophagus and could be advanced several cm. Bibasilar infiltrative changes increased from the prior exam. Electronically Signed   By: Inez Catalina M.D.   On: 03/19/2019 07:23    Medications:  Scheduled: . sodium chloride   Intravenous Once  . acetaminophen (TYLENOL) oral liquid 160 mg/5 mL  1,000 mg Per Tube Q6H  . bisacodyl  10 mg Oral Daily   Or  . bisacodyl  10 mg Rectal Daily  . chlorhexidine gluconate (MEDLINE KIT)  15 mL Mouth Rinse BID  . Chlorhexidine Gluconate Cloth  6 each Topical Q0600  . docusate sodium  200 mg Oral Daily  . insulin aspart  0-24 Units Subcutaneous Q4H  . levalbuterol  0.63 mg Nebulization Q6H  . mouth rinse  15 mL Mouth Rinse 10 times per day  . mupirocin cream   Topical BID  . pantoprazole (PROTONIX) IV  40 mg Intravenous Q24H  . sodium chloride flush  3 mL Intravenous Q12H   Continuous: . sodium chloride Stopped (03/18/19 1754)  . sodium chloride    . sodium chloride Stopped (03/20/19 6803)  . amiodarone 30 mg/hr (03/20/19 0700)  . cefTRIAXone (ROCEPHIN)  IV Stopped (03/19/19 1146)  . dextrose 20 mL/hr at 03/20/19 0700  . epinephrine 3 mcg/min (03/20/19 0700)  . furosemide (LASIX) infusion 10 mg/hr (03/20/19 0700)  . lactated ringers Stopped (03/20/19  0541)  . lactated ringers 10 mL/hr at 03/20/19 0700  . levETIRAcetam Stopped (03/19/19 2236)  . midazolam (VERSED) infusion 60 mg/hr (03/20/19 0700)  . milrinone 0.25 mcg/kg/min (03/20/19 0700)  . norepinephrine (LEVOPHED) Adult infusion 8 mcg/min (03/20/19 0700)  . propofol (DIPRIVAN) infusion 25 mcg/kg/min (03/20/19 0700)  . valproate sodium 55 mL/hr at 03/20/19 0700    LTM EEG report, 6/30: Beginning time: 03/18/19 at 0912 Ending time:  03/19/19 at 0730 Day 1 : This recording begins background activities marked by nonreactive attenuated slowing with superimposed continuous to 2.5 cps broadly distributed spike and wave discharges maximum negativity left frontocentral cortex consistent with nonconvulsive status epilepticus.  With medication adjustment as recording continuous background activities evolve into suppressed nonreactive background activities with superimposed predominantly left frontocentral sharp waves and spike and wave discharges was brought negative field at times to given appearance of generalized spike and wave discharges.  At times such broad epileptiform discharges appear in short 3 to 5 seconds along evolving trains concerning for brief electrographic seizures.  Clinical interpretation: This day 1 of was abnormal and consistent with severe encephalopathy of nonspecific etiologies.  Nonconvulsive status epilepticus largely resolved however background activities continue to demonstrate frequent broad spike and wave discharges maximal in the left frontocentral cortex.  Brief electrographic seizures cannot be excluded.    Assessment:73 year old female in status epilepticus, converted to GPEDs with Keppra, valproic acid and Versed gtt 1.New onset seizures with status epilepticus. Most likely secondary to either anoxic brain injury, perioperative stroke or a combination. CT head performed yesterday revealed cortical and subcortical hypodensity, most conspicuous in the  right hemisphere  and generally in a watershed distribution. 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 Most likely was due to hypoperfusion versus in situ thrombosis or distal embolization from the aortic arch occurring during the procedure.  2. Given refractory nature of the GPEDs seen yesterday on EEG, they are likely due to diffuse injury of the cerebral cortices and/or thalami.    3. EEG tracings today reveal burst suppression pattern with resolution of GPEDs. This is an improvement relative to the prior EEG findings from yesterday.      Recommendations: 1. ContinueVersedgtt at36m/hr and propofol at 33 mcg/kg/min. 2. Will not be able to further escalate the propofol rate on pressors as this would be detrimental to the patient from the aortic surgery/repair standpoint and is not indicated.  3. Continue Keppra and valproic acid scheduled dosing. Will obtain valproic acid level 4. She is not able to tolerate MRI currently per Cardiothoracic Surgery team.  5. Continue LTM.  6. Plan for tomorrow is to repeat exam with Versed and propofol held.   35 minutes of critical care time spent in the evaluation and management of the patient. Time spent included telephone discussion with her husband.     LOS: 5 days   _0  signed: Dr. EKerney Elbe7/09/2018  7:30 AM

## 2019-03-20 NOTE — Progress Notes (Signed)
Pharmacy Antibiotic Note  Madeline [name] is a 73 y.o. female admitted on 02/25/2019 with concern for HCAP. Had been on Ceftriaxone, In AKI  Plan: DC Ceftriaxone Cefepime 1 g q24h Vanc 2 g x 1 F/u renal fx vs renal plans for further dosing 3 day duration per CCM - f/u need for longer  Height: 5\' 8"  (172.7 cm) Weight: 238 lb 15.7 oz (108.4 kg) IBW/kg (Calculated) : 63.9  Temp (24hrs), Avg:98.2 F (36.8 C), Min:96.8 F (36 C), Max:99 F (37.2 C)  Recent Labs  Lab 03/17/19 1852 03/18/19 0419 03/19/19 0354 03/19/19 1815 03/20/19 0420 03/20/19 1630  WBC 19.2* 16.6* 15.4* 13.2* 10.7*  --   CREATININE 2.58* 3.12* 3.69* 3.91* 4.12* 4.12*    Estimated Creatinine Clearance: 15.7 mL/min (A) (by C-G formula based on SCr of 4.12 mg/dL (H)).    Allergies  Allergen Reactions  . Ivp Dye [Iodinated Diagnostic Agents]     Rash , hives  . Codeine Itching    Unless something given to counteract itching   Levester Fresh, PharmD, BCPS, BCCCP Clinical Pharmacist [phone]  Please check AMION for all [facility] numbers  03/20/2019 5:19 PM

## 2019-03-20 NOTE — Consult Note (Signed)
NAME:  Madeline [name], MRN:  [id number], DOB:  [birth year], LOS: 5 ADMISSION DATE:  03/12/2019, CONSULTATION DATE:  03/20/2019 REFERRING MD:  Dr. 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 S/p emergent repair with cardio thoracic surgery on 6/26. Post-op course c/b multiorgan dysfunction and new seizure activity and EEG consistent with severe encephalopathy. Pt unresponsive on Versed, propofol, Keppra, and valproic acid.  History of present illness   [name] with a  history of HTN, HLD with intermittent epigastric chest pain for the past few weeks. Multiple ED visits with normal cardiac enzymes and Echo showing LVH with normal valves and no pericardial effusions. 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 She underwent emergent surgical replacement of ascending aorta using a Hemashield vascular graft. 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  Post-operative course was initially complicated by multiorgan dysfunction including atrial fibrillation with RVR, acute renal failure and acute respiratory failure, requiring mechanical ventilation and sedation. On 6/28, while on Precedex, patient was noted to have facial twitching concerning for seizure activity that was unresponsive to [place]. EEG was ordered and showed electrographic status epilepticus, which persisted after giving Ativan. Neurology was consulted and are managing patient on Versed and propofol, with scheduled Keppra, and valproic acid.  Family history significant for Aortic dissection in father (deceased). No history of seizures in pt or family.  Past Medical History    Past Medical History:  Diagnosis Date  . Anxiety   . Arthritis 09-14-11   osteoarthritis, osteopenia  . Cataracts, both eyes 09-13-11   not surgical yet  . Cholelithiasis 05/17/2016  . Chronic neck pain 05/29/2016  . Cystitis 2007   sepsis post bladder biopsy, Dr Jeffie Pollock  . Fibromyalgia 09-14-11   Dr Donney Dice, [facility]  . Fractures 09/14/2011   toes-left foot  . GERD (gastroesophageal reflux disease) 09-14-11   tx. Nexium  . H/O foot surgery 12/19/2011  . Hiatal hernia 05/17/2016  . Hyperglycemia 08/23/2016  . Hyperlipidemia   . Hypertension 09-13-11   tx. meds  . 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 Left ventricular hypertrophy 05/15/2017  . Muscle cramping 10/23/2017  . TMJ click 16/96/7893   right side > left  . 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 EF >65%. Mild aortic regurgitation. No pericardial effusion.  6/26 - CTA Chest/Abd/ Pelv: Penetrating ulver involving anterior ascending aorta with acute intraluminal hematoma. Small volume hemopericardium. Trace L-sided pleural effusion.  6/30 CT Head: Cortical and subcortical hypodensity in R hemisphere, watershed distribution. Periventricular and deep white matter hypodensity. Findings concerning for hypoperfusion ischemia involving R hemisphere.   6/30 LTM EEG: Abnormal and consistent with severe encephalopathy of nonspecific etiologies.  Nonconvulsive status epilepticus largely resolved however background activities continue to demonstrate frequent broad spike and wave  discharges maximal in the left  frontocentral cortex   Micro Data:  6/26 - SARD CoV-2: Negative 6/28 - Respiratory Culture: NGTD 6/29 - Nasal swab: Staph aureus Positive  Antimicrobials:  Cephtriaxone (6/28- )  Interim history/subjective:  Patient sedated, on mechanical ventilation. Unresponsive. Per nursing, unable to decrease norepinephrine drip <16 today.   Objective   Blood pressure (!) 108/41, pulse (!) 103, temperature 97.7 F (36.5 C), resp. rate 18, height 5\' 8"  (1.727 m), weight 108.4 kg, SpO2 90 %. PAP: (20-32)/(14-27) 20/14 CVP:  [11 mmHg-21 mmHg] 15 mmHg CO:  [5.9 L/min] 5.9 L/min CI:  [3 L/min/m2-3.1 L/min/m2] 3 L/min/m2  Vent Mode: PRVC FiO2 (%):  [70 %-100 %] 100 % Set Rate:  [16 bmp-18 bmp] 18 bmp Vt Set:  [510 mL] 510 mL PEEP:  [5 cmH20-8 cmH20] 8 cmH20 Plateau Pressure:  [18 cmH20-23 cmH20] 21 cmH20   Intake/Output Summary (Last 24 hours) at 03/20/2019 1046 Last data filed at 03/20/2019 1000 Gross per 24 hour  Intake 5518.39 ml  Output 1600 ml  Net 3918.39 ml   Filed Weights   03/18/19 0230 03/19/19 0500 03/20/19 0500  Weight: 100.2 kg 105 kg 108.4 kg    Examination: General: Sedated on venilator HENT: MMM Lungs: Coarse breath sounds anteriorly Cardiovascular: Irregular rhythm, normal rate. No m/r/g. Bounding carotid pulses. Chest tube in place on L chest, no drainage. Dressing c/d/i. Abdomen: Soft, non-distended. Hypoactive bowel sounds.  Extremities: 2+ BUE edema. Neuro: Unresponsive to verbal or noxious stimuli. Flaccid tone throughout. GU: Foley in place draining amber-colored urine  Resolved Hospital Problem list   N/A  Assessment & Plan:   Neuro: Status Epilepticus - 6/30 LTM EEG showing severe encephalopathy, mostly resolved status epilepticus, and background L frontocentral spike discharges - Neuro following, recs appreciated - Versed at 60 mg/hr - Propofol at 33 mcg/kg/min - Continue scheduled Keppra 100mg  and valproic acid 500mg  - LTM EEG  Acute Encephalopathy:  2/2 status epilepticus and hypoperfusion ischemia - 6/30 CT Head with watershed distribution c/f hypoperfusion ischemia - Seizure management as above  Cardiovascular: Aortic dissection s/p repair 6/26 - Cardiothoracic surgery following, recs appreciated - Metoprolol PRN  Shock: Likely combined cardiogenic and distributive - Epinephrine at 3 mcg/min - Milrinone at 0.25 mcg/kg/min - Norepinephrine drip - Pressors currently at max for CV safety  Atrial Fibrilation w/ RVR, post-operative - rates to 100s-110s intermittently - continue amiodarone 60mg   Respiratory: Acute Hypoxic Respiratory Failure - ABG: pH 7.289, pCO2 47, pO2 55 - Chest tube w/ 280 out over 24hrs - Persistently poor oxygenation despite FiO2 at 100%  - IV Lasix 15mg /hr and PRN metolazone 10mg   Renal: Acute Renal Failure - Worsening renal function, Cr 4.12<3.91<3.69 - On Lasix and s/p metolazone as above - Poor UOP relative to fluid intake and diuretics - May benefit from Nephrology consult and CRRT consideration  Heme: Thrombocytopenia - s/p platelet transfusion 6/30 - Plt stable at 46 s/p transfusion - CBC Daily  Infectious Disease:  Staph aureus colonization - Bactroban BID  Leukocytosis - WBC 10.7< 13.2 - Respiratory Cx NGTD - Ceftriaxone (6/28- )  GI: Transaminitis likely 2/2 shock and venous congestion, worsening. -  AST 191, ALT 101, AlkPhos 69, TBili 1.3 - CTM  Endocrine: Hypoglycemia - BG 100>105>93 - Continue D10W - CTM   Best practice:  Diet: Tube feeds Pain/Anxiety/Delirium protocol (if indicated): N/A VAP protocol (if indicated):  Per protocol DVT prophylaxis: SCDs GI prophylaxis: Protonix Glucose control: SSI Mobility: Bedrest (sedated) Code Status: Full Family Communication:  Will reach out to daughter Disposition: ICU  Labs   CBC: Recent Labs  Lab 03/09/2019 1200  03/17/19 1852  03/18/19 0419  03/19/19 0354  03/19/19 1815 03/19/19 2001 03/20/19 0420 03/20/19  0548 03/20/19 0801  WBC 11.0*   < > 19.2*  --  16.6*  --  15.4*  --  13.2*  --  10.7*  --   --   NEUTROABS 8.4*  --   --   --   --   --   --   --   --   --   --   --   --   HGB 11.8*   < > 14.3   < > 12.7   < > 12.8   < > 11.7* 12.6 11.9* 12.6 12.6  HCT 36.7   < > 40.4   < > 36.2   < > 36.9   < > 34.9* 37.0 35.1* 37.0 37.0  MCV 89.5   < > 82.6  --  83.2  --  83.9  --  87.0  --  86.5  --   --   PLT 414*   < > 33*  --  28*  --  17*  --  46*  --  46*  --   --    < > = values in this interval not displayed.    Basic Metabolic Panel: Recent Labs  Lab 03/16/19 1105  03/16/19 1718  03/17/19 0200  03/17/19 1852  03/18/19 0419  03/19/19 0354  03/19/19 1815 03/19/19 2001 03/20/19 0420 03/20/19 0548 03/20/19 0801  NA 147*   < > 151*   < > 145   < > 146*   < > 145   < > 142   < > 142 142 142 141 140  K 4.4   < > 3.5   < > 3.9   < > 3.9   < > 3.6   < > 3.9   < > 4.0 3.7 4.1 3.9 3.8  CL 114*  --  113*   < > 113*  --  109  --  110  --  107  --  106  --  105  --   --   CO2 20*  --  23  --  23  --  23  --  24  --  21*  --  22  --  22  --   --   GLUCOSE 192*   < > 136*   < > 123*   < > 211*  --  153*  --  128*  --  121*  --  107*  --   --   BUN 10  --  13   < > 18  --  26*  --  31*  --  42*  --  45*  --  50*  --   --   CREATININE 0.95  --  1.43*   < > 1.88*  --  2.58*  --  3.12*  --  3.69*  --  3.91*  --  4.12*  --   --   CALCIUM 10.5*  --  10.3  --  9.6  --  8.8*  --  8.5*  --  7.6*  --  7.5*  --  7.5*  --   --   MG 1.8  --  3.4*  --  2.7*  --  2.6*  --   --   --   --   --   --   --   --   --   --    < > =  values in this interval not displayed.   GFR: Estimated Creatinine Clearance: 15.7 mL/min (A) (by C-G formula based on SCr of 4.12 mg/dL (H)). Recent Labs  Lab 03/18/19 0419 03/19/19 0354 03/19/19 1815 03/20/19 0420  WBC 16.6* 15.4* 13.2* 10.7*    Liver Function Tests: Recent Labs  Lab 02/28/2019 1200 03/17/19 0200 03/18/19 0419 03/19/19 0354 03/20/19 0420  AST 295* 116* 104*  183* 191*  ALT 180* 53* 51* 92* 101*  ALKPHOS 102 30* 40 51 69  BILITOT 0.7 5.2* 3.6* 2.0* 1.3*  PROT 7.7 3.9* 3.6* 3.7* 3.9*  ALBUMIN 3.4* 2.9* 2.5* 2.4* 2.2*   Recent Labs  Lab 03/05/2019 1200  LIPASE 24   No results for input(s): AMMONIA in the last 168 hours.  ABG    Component Value Date/Time   PHART 7.289 (L) 03/20/2019 0801   PCO2ART 47.1 03/20/2019 0801   PO2ART 55.0 (L) 03/20/2019 0801   HCO3 22.6 03/20/2019 0801   TCO2 24 03/20/2019 0801   ACIDBASEDEF 4.0 (H) 03/20/2019 0801   O2SAT 84.0 03/20/2019 0801     Coagulation Profile: Recent Labs  Lab 03/16/19 1321  INR 1.4*    Cardiac Enzymes: No results for input(s): CKTOTAL, CKMB, CKMBINDEX, TROPONINI in the last 168 hours.  HbA1C: Hgb A1c MFr Bld  Date/Time Value Ref Range Status  02/22/2018 10:06 AM 5.7 4.6 - 6.5 % Final    Comment:    Glycemic Control Guidelines for People with Diabetes:Non Diabetic:  <6%Goal of Therapy: <7%Additional Action Suggested:  >8%   10/23/2017 10:58 AM 5.9 4.6 - 6.5 % Final    Comment:    Glycemic Control Guidelines for People with Diabetes:Non Diabetic:  <6%Goal of Therapy: <7%Additional Action Suggested:  >8%     CBG: Recent Labs  Lab 03/19/19 1552 03/19/19 1958 03/20/19 0036 03/20/19 0424 03/20/19 0759  GLUCAP 78 115* 117* 93 105*    Review of Systems:   Unable to assess; patient sedated and unresponsive.  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   Surgical History    Past Surgical History:  Procedure Laterality Date  . ABDOMINAL HYSTERECTOMY  1977   dysfunctional menses; age 25  . APPENDECTOMY  1963  . COLONOSCOPY  2011   negative  . CYSTOSTOMY W/ BLADDER BIOPSY  04-2006  . FOOT ARTHRODESIS  01/03/12   [facility]-- left hallux MP joint arthrodesis  . HAMMER TOE SURGERY  01/03/12   [facility], Dr Doran Durand  . NASAL SINUS SURGERY  09-14-11   right side " sinus lift"  . REPLACEMENT ASCENDING AORTA N/A 02/24/2019   Procedure: REPLACEMENT OF ASCENDING AORTA USING HEMASHIELD PLATINUM 28MM and 30MM VASCULAR GRAFT;  Surgeon: Ivin Poot, MD;  Location: [facility];  Service: Open Heart Surgery;  Laterality: N/A;  . SIGMOIDOSCOPY    . sling procedure  2006   a and p repair  . TONSILLECTOMY    . TOTAL KNEE ARTHROPLASTY  09/21/2011/[facility]   Procedure: TOTAL KNEE ARTHROPLASTY;  Surgeon: Gearlean Alf;  Location: [facility];  Service: Orthopedics;  Laterality: Left;  . TOTAL KNEE ARTHROPLASTY  06/25/2012   Procedure: TOTAL KNEE ARTHROPLASTY;  Surgeon: Gearlean Alf, MD;  Location: [facility];  Service: Orthopedics;  Laterality: Right;     Social History   reports that she has never smoked. She has never used smokeless tobacco. She reports previous alcohol use. She reports that she does not use drugs.   Family History   Her family history includes AAA (abdominal aortic aneurysm) in her son; Aortic aneurysm in her father; Aortic dissection in her father; Colon cancer in her paternal aunt; Heart attack in her maternal grandfather, paternal grandfather, paternal grandmother, and another family member; Heart disease in her paternal grandfather; Hypertension in her daughter, maternal grandfather, maternal grandmother, mother, paternal grandfather, paternal grandmother, and son; Mental illness in her brother; Other in her maternal grandmother and mother; Pneumonia in her brother; Stroke in her paternal aunt; Supraventricular  tachycardia in her daughter. There is no history of Diabetes.   Allergies Allergies  Allergen Reactions  . Ivp Dye [Iodinated Diagnostic Agents]     Rash , hives  . Codeine Itching    Unless something given to counteract itching     Home Medications  Prior to Admission medications   Medication Sig Start Date End Date Taking? Authorizing Provider  aspirin EC 81 MG tablet Take 81 mg by mouth at bedtime.    Yes [provider]  Cholecalciferol (VITAMIN D3) 2000 UNITS TABS Take 2,000 Units by mouth 2 (two) times a day.    Yes [provider]  cyclobenzaprine (FLEXERIL) 10 MG tablet Take 10 mg by mouth 3 (three) times daily as needed for muscle spasms.   Yes [provider]  DULoxetine (CYMBALTA) 60 MG capsule Take 60 mg by mouth daily.   Yes [provider]  furosemide (LASIX) 20 MG tablet Take 20 mg by mouth as needed.   Yes [provider]  Multiple Vitamin (MULTI-DAY PO) Take 1 tablet by mouth daily.    Yes [provider]  pantoprazole (PROTONIX) 40 MG tablet Take 1 tablet (40 mg total) by mouth daily. 03/02/19 03/01/20 Yes Guilford Shi, MD  pravastatin (PRAVACHOL) 40 MG tablet Take 1 tablet (40 mg total) by mouth at bedtime. 08/28/18  Yes Mosie Lukes, MD  pyridOXINE (VITAMIN B-6) 100 MG tablet Take 100 mg by mouth daily.   Yes [provider]  tizanidine (ZANAFLEX) 2 MG capsule Take 4 mg by mouth at bedtime.   Yes [provider]  traMADol (ULTRAM) 50 MG tablet Take 2 tablets by mouth every 6 (six) hours as needed. Not to exceed 6 tablets a day 06/04/15  Yes [provider]  metoprolol tartrate (LOPRESSOR) 25 MG tablet Take 0.5 tablets (12.5 mg total) by mouth 2 (two) times daily. Hold SBP<100, HR <60 03/07/19 03/06/20  Minus Breeding, MD  predniSONE (DELTASONE) 50 MG tablet Take 1 tablet 13 hours, 7 hours, and 1 hour prior to CT test 03/13/19   Hilty, Nadean Corwin, MD     Critical care time: 40 minutes    -- Armida Sans, MS4

## 2019-03-20 NOTE — Progress Notes (Signed)
Patient RN to follow up with MD regarding PICC order. If PICC is no longer needed, patient RN to discontinue order.

## 2019-03-20 NOTE — Progress Notes (Addendum)
Nutrition Follow-up  DOCUMENTATION CODES:   Not applicable  INTERVENTION:   Unable to place Cortrak until Friday (service offered from 8am-4pm)  Tube feeding recommendations: -Vital High Protein @ 40 ml/hr -30 ml Prostat once daily  Provides: 1300 kcals (1612 kcal with propofol), 120 grams protein, 1003 ml free water. Meets 106% kcal needs and 100% of protein needs.   NUTRITION DIAGNOSIS:   Increased nutrient needs related to post-op healing as evidenced by estimated needs.  Ongoing  GOAL:   Patient will meet greater than or equal to 90% of their needs  Not meeting  MONITOR:   I & O's, Labs, Weight trends, Vent status, Skin  REASON FOR ASSESSMENT:   Ventilator    ASSESSMENT:   73 year old female who presented to the ED on 6/26 with RUQ pain. PMH of LVH, septal hypertrophy, HTN, HLD. US showing cholelithiasis without cholecystitis. 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   6/26 - s/p emergent repair of penetrating ulcer and replacement of the ascending aorta under hypothermic circulatory arrest  RD working remotely.  Pt requiring multiple pressors. RN unable to decrease today. Prognosis poor, repeat brain CT tomorrow. Cortrak ordered by [facility] for post-pyloric feedings as they want to keep OGT to suction. Made RN aware that Cortrak is not available until Friday. Discussed other options like IR bedside placement if needed.   Admission weight: 79.8 kg Current weight: 108.4 kg  Generalized moderate +2 pitting edema noted in nursing assessment.   Patient is currently intubated on ventilator support MV: 9.2 L/min Temp (24hrs), Avg:98.3 F (36.8 C), Min:96.8 F (36 C), Max:99 F (37.2 C)  Propofol: 11.8 ml/hr provides 312 kcal  I/O: +30,439 ml since admit UOP: 1,085 ml x 24 hrs OGT: 25 ml x 24 hrs  Chest tubes: 280 ml x 24 hrs  Drips: amiodarone, epinephrine, milrinone, levophed, D10 @ 20  ml/hr, 250 mg lasix in D5 @ 10 ml/hr, propofol Medications: dulcolax, colace, SS novolog Labs: LFTs elevated Mg 2.8 (H) Cr 4.12- trending up    Diet Order:   Diet Order            Diet NPO time specified  Diet effective now              EDUCATION NEEDS:   No education needs have been identified at this time  Skin:  Skin Assessment: Skin Integrity Issues: Skin Integrity Issues:: Incisions Incisions: closed incisions to chest and right leg  Last BM:  PTA  Height:   Ht Readings from Last 1 Encounters:  02/27/2019 5\' 8"  (1.727 m)    Weight:   Wt Readings from Last 1 Encounters:  03/20/19 108.4 kg    Ideal Body Weight:  63.6 kg  BMI:  Body mass index is 36.34 kg/m.  Estimated Nutritional Needs:   Kcal:  1516 kcal  Protein:  105-120 gram  Fluid:  >/= 1.5 L/day  Mariana Single RD, LDN Clinical Nutrition Pager # - [phone]

## 2019-03-20 DEATH — deceased

## 2019-03-21 ENCOUNTER — Inpatient Hospital Stay (HOSPITAL_COMMUNITY): Payer: Medicare Other

## 2019-03-21 DIAGNOSIS — R569 Unspecified convulsions: Secondary | ICD-10-CM

## 2019-03-21 LAB — POCT I-STAT 7, (LYTES, BLD GAS, ICA,H+H)
Acid-base deficit: 6 mmol/L — ABNORMAL HIGH (ref 0.0–2.0)
Acid-base deficit: 6 mmol/L — ABNORMAL HIGH (ref 0.0–2.0)
Acid-base deficit: 8 mmol/L — ABNORMAL HIGH (ref 0.0–2.0)
Acid-base deficit: 5 mmol/L — ABNORMAL HIGH (ref 0.0–2.0)
Bicarbonate: 21.8 mmol/L (ref 20.0–28.0)
Bicarbonate: 20.6 mmol/L (ref 20.0–28.0)
Bicarbonate: 18.9 mmol/L — ABNORMAL LOW (ref 20.0–28.0)
Bicarbonate: 21.6 mmol/L (ref 20.0–28.0)
Calcium, Ion: 1.03 mmol/L — ABNORMAL LOW (ref 1.15–1.40)
Calcium, Ion: 1.01 mmol/L — ABNORMAL LOW (ref 1.15–1.40)
Calcium, Ion: 0.95 mmol/L — ABNORMAL LOW (ref 1.15–1.40)
Calcium, Ion: 1.01 mmol/L — ABNORMAL LOW (ref 1.15–1.40)
HCT: 39 % (ref 36.0–46.0)
HCT: 37 % (ref 36.0–46.0)
HCT: 36 % (ref 36.0–46.0)
HCT: 37 % (ref 36.0–46.0)
Hemoglobin: 13.3 g/dL (ref 12.0–15.0)
Hemoglobin: 12.6 g/dL (ref 12.0–15.0)
Hemoglobin: 12.2 g/dL (ref 12.0–15.0)
Hemoglobin: 12.6 g/dL (ref 12.0–15.0)
O2 Saturation: 87 %
O2 Saturation: 85 %
O2 Saturation: 86 %
O2 Saturation: 90 %
Patient temperature: 37.5
Patient temperature: 37.8
Patient temperature: 37.6
Patient temperature: 37.4
Potassium: 3.3 mmol/L — ABNORMAL LOW (ref 3.5–5.1)
Potassium: 3.4 mmol/L — ABNORMAL LOW (ref 3.5–5.1)
Potassium: 2.7 mmol/L — CL (ref 3.5–5.1)
Potassium: 3.1 mmol/L — ABNORMAL LOW (ref 3.5–5.1)
Sodium: 141 mmol/L (ref 135–145)
Sodium: 140 mmol/L (ref 135–145)
Sodium: 143 mmol/L (ref 135–145)
Sodium: 138 mmol/L (ref 135–145)
TCO2: 23 mmol/L (ref 22–32)
TCO2: 22 mmol/L (ref 22–32)
TCO2: 20 mmol/L — ABNORMAL LOW (ref 22–32)
TCO2: 23 mmol/L (ref 22–32)
pCO2 arterial: 53 mmHg — ABNORMAL HIGH (ref 32.0–48.0)
pCO2 arterial: 46.6 mmHg (ref 32.0–48.0)
pCO2 arterial: 43.8 mmHg (ref 32.0–48.0)
pCO2 arterial: 47.3 mmHg (ref 32.0–48.0)
pH, Arterial: 7.225 — ABNORMAL LOW (ref 7.350–7.450)
pH, Arterial: 7.257 — ABNORMAL LOW (ref 7.350–7.450)
pH, Arterial: 7.245 — ABNORMAL LOW (ref 7.350–7.450)
pH, Arterial: 7.27 — ABNORMAL LOW (ref 7.350–7.450)
pO2, Arterial: 65 mmHg — ABNORMAL LOW (ref 83.0–108.0)
pO2, Arterial: 61 mmHg — ABNORMAL LOW (ref 83.0–108.0)
pO2, Arterial: 61 mmHg — ABNORMAL LOW (ref 83.0–108.0)
pO2, Arterial: 69 mmHg — ABNORMAL LOW (ref 83.0–108.0)

## 2019-03-21 LAB — COMPREHENSIVE METABOLIC PANEL
ALT: 105 U/L — ABNORMAL HIGH (ref 0–44)
AST: 222 U/L — ABNORMAL HIGH (ref 15–41)
Albumin: 1.9 g/dL — ABNORMAL LOW (ref 3.5–5.0)
Alkaline Phosphatase: 93 U/L (ref 38–126)
Anion gap: 17 — ABNORMAL HIGH (ref 5–15)
BUN: 55 mg/dL — ABNORMAL HIGH (ref 8–23)
CO2: 20 mmol/L — ABNORMAL LOW (ref 22–32)
Calcium: 7.1 mg/dL — ABNORMAL LOW (ref 8.9–10.3)
Chloride: 103 mmol/L (ref 98–111)
Creatinine, Ser: 4.02 mg/dL — ABNORMAL HIGH (ref 0.44–1.00)
GFR calc Af Amer: 12 mL/min — ABNORMAL LOW (ref >60)
GFR calc non Af Amer: 10 mL/min — ABNORMAL LOW (ref >60)
Glucose, Bld: 119 mg/dL — ABNORMAL HIGH (ref 70–99)
Potassium: 3.4 mmol/L — ABNORMAL LOW (ref 3.5–5.1)
Sodium: 140 mmol/L (ref 135–145)
Total Bilirubin: 1.9 mg/dL — ABNORMAL HIGH (ref 0.3–1.2)
Total Protein: 3.8 g/dL — ABNORMAL LOW (ref 6.5–8.1)

## 2019-03-21 LAB — CBC
HCT: 34.1 % — ABNORMAL LOW (ref 36.0–46.0)
Hemoglobin: 11.8 g/dL — ABNORMAL LOW (ref 12.0–15.0)
MCH: 29.6 pg (ref 26.0–34.0)
MCHC: 34.6 g/dL (ref 30.0–36.0)
MCV: 85.5 fL (ref 80.0–100.0)
Platelets: 41 10*3/uL — ABNORMAL LOW (ref 150–400)
RBC: 3.99 MIL/uL (ref 3.87–5.11)
RDW: 17.2 % — ABNORMAL HIGH (ref 11.5–15.5)
WBC: 10.4 10*3/uL (ref 4.0–10.5)
nRBC: 5.3 % — ABNORMAL HIGH (ref 0.0–0.2)

## 2019-03-21 LAB — HEPARIN LEVEL (UNFRACTIONATED): Heparin Unfractionated: <0.1 IU/mL — ABNORMAL LOW (ref 0.30–0.70)

## 2019-03-21 LAB — COOXEMETRY PANEL
Carboxyhemoglobin: 0.6 % (ref 0.5–1.5)
Carboxyhemoglobin: 1.4 % (ref 0.5–1.5)
Methemoglobin: 1.2 % (ref 0.0–1.5)
Methemoglobin: 1 % (ref 0.0–1.5)
O2 Saturation: 73.3 %
O2 Saturation: 79.1 %
Total hemoglobin: 11.5 g/dL — ABNORMAL LOW (ref 12.0–16.0)
Total hemoglobin: 11 g/dL — ABNORMAL LOW (ref 12.0–16.0)

## 2019-03-21 LAB — BASIC METABOLIC PANEL
Anion gap: 18 — ABNORMAL HIGH (ref 5–15)
BUN: 60 mg/dL — ABNORMAL HIGH (ref 8–23)
CO2: 20 mmol/L — ABNORMAL LOW (ref 22–32)
Calcium: 7.1 mg/dL — ABNORMAL LOW (ref 8.9–10.3)
Chloride: 102 mmol/L (ref 98–111)
Creatinine, Ser: 4.17 mg/dL — ABNORMAL HIGH (ref 0.44–1.00)
GFR calc Af Amer: 12 mL/min — ABNORMAL LOW (ref >60)
GFR calc non Af Amer: 10 mL/min — ABNORMAL LOW (ref >60)
Glucose, Bld: 102 mg/dL — ABNORMAL HIGH (ref 70–99)
Potassium: 3.2 mmol/L — ABNORMAL LOW (ref 3.5–5.1)
Sodium: 140 mmol/L (ref 135–145)

## 2019-03-21 LAB — APTT
aPTT: 36 seconds (ref 24–36)
aPTT: 56 seconds — ABNORMAL HIGH (ref 24–36)
aPTT: 55 seconds — ABNORMAL HIGH (ref 24–36)

## 2019-03-21 LAB — GLUCOSE, CAPILLARY
Glucose-Capillary: 105 mg/dL — ABNORMAL HIGH (ref 70–99)
Glucose-Capillary: 100 mg/dL — ABNORMAL HIGH (ref 70–99)
Glucose-Capillary: 89 mg/dL (ref 70–99)
Glucose-Capillary: 99 mg/dL (ref 70–99)
Glucose-Capillary: 106 mg/dL — ABNORMAL HIGH (ref 70–99)

## 2019-03-21 LAB — VALPROIC ACID LEVEL: Valproic Acid Lvl: 58 ug/mL (ref 50.0–100.0)

## 2019-03-21 LAB — TRIGLYCERIDES: Triglycerides: 127 mg/dL (ref <150)

## 2019-03-21 MED ORDER — MORPHINE SULFATE (PF) 2 MG/ML IV SOLN
2.0000 mg | Freq: Four times a day (QID) | INTRAVENOUS | Status: DC
  Administered 2019-03-21 – 2019-03-22 (×2): 2 mg via INTRAVENOUS
  Filled 2019-03-21 (×2): qty 1

## 2019-03-21 MED ORDER — FUROSEMIDE 10 MG/ML IJ SOLN
80.0000 mg | Freq: Once | INTRAMUSCULAR | Status: AC
  Administered 2019-03-21: 80 mg via INTRAVENOUS
  Filled 2019-03-21: qty 8

## 2019-03-21 MED ORDER — LEVALBUTEROL HCL 0.63 MG/3ML IN NEBU
0.6300 mg | INHALATION_SOLUTION | Freq: Three times a day (TID) | RESPIRATORY_TRACT | Status: DC
  Administered 2019-03-21 – 2019-03-22 (×6): 0.63 mg via RESPIRATORY_TRACT
  Filled 2019-03-21 (×7): qty 3

## 2019-03-21 MED ORDER — SODIUM BICARBONATE 8.4 % IV SOLN
50.0000 meq | Freq: Once | INTRAVENOUS | Status: AC
  Administered 2019-03-21: 50 meq via INTRAVENOUS
  Filled 2019-03-21: qty 50

## 2019-03-21 MED ORDER — HEPARIN (PORCINE) 25000 UT/250ML-% IV SOLN
1000.0000 [IU]/h | INTRAVENOUS | Status: DC
  Administered 2019-03-21: 09:00:00 800 [IU]/h via INTRAVENOUS
  Filled 2019-03-21 (×2): qty 250

## 2019-03-21 NOTE — Progress Notes (Signed)
Intensivist Progress Note  S: Seen in f/u for severe hypoxemic respiratory failure.  Oxygenating worse this morning. CXR with continued bilateral infiltrates.  EEG ongoing.  O:  Today's Vitals   03/21/19 0500 03/21/19 0600 03/21/19 0700 03/21/19 0829  BP:      Pulse: 100 100 (!) 101   Resp: (!) 22 (!) 23 (!) 22   Temp: 100 F (37.8 C) 100.2 F (37.9 C) 100 F (37.8 C)   TempSrc:      SpO2: (!) 89% (!) 87% 91% (!) 89%  Weight: 110.5 kg     Height: 5\' 8"  (1.727 m)     PainSc:       Body mass index is 37.04 kg/m. GEN: ill appearing unresponsive woman on vent HEENT: ETT in place with minimal secretions CV: Tachycardic, ext warm PULM: Bilateral rhonci, passive on vent GI: Soft, hypoactive BS EXT: Trace anasarca NEURO: GCS3 PSYCH: Deferred SKIN: pale  A:  # Cardiogenic shock continues # ARDS with at least some component of pulmonary edema.  Degree of hypoxemia not clearly explained by CXR, discussed with primary and will begin low dose heparin to hopefully address any component of VTE that may be present. # Aortic dissection s/p emergent graft # Post repair status epilepticus concerning for anoxic injury currently heavily sedated to burst suppression by neurology # Afib RVR improved  P: - Pressors per primary - Sedatives and EEG per neurology - Continue vent support, check ABG, we have increased PEEP a bit which she responded to - Family came in and we discussed how things are going, they want to hold the course to see if she will wake up or not.  However, in the event of cardiac arrest they do not want her to receive shocks or compressions.  DNR order entered.  35 minutes critical care time.

## 2019-03-21 NOTE — Progress Notes (Signed)
Pt. Daughter, Servando Salina [phone]) requesting that any physician that calls pt spouse also call her for updates.  Sherlie Ban, RN

## 2019-03-21 NOTE — Progress Notes (Signed)
Per CCM NP, pt sat goal >88%.

## 2019-03-21 NOTE — Procedures (Signed)
  Electroencephalogram report- LTM with VIDEO   Beginning time: 7/1//20 at 0730 Ending time:  7/2//20 at 0730 CPT/type : 95720  Day of study: day 3   Technical Description: The EEG was performed using standard setting per the guidelines of American Clinical Neurophysiology Society (ACNS).    A minimum of 21 electrodes were placed on scalp according to the International 10-20 or/and 10-10 Systems. Supplemental electrodes were placed as needed. Single EKG electrode was also used to detect cardiac arrhythmia. Patient's behavior was continuously recorded on video simultaneously with EEG. A minimum of 16 channels were used for data display. Each epoch of study was reviewed manually daily and as needed using standard referential and bipolar montages. Computerized quantitative EEG analysis (such as compressed spectral array analysis, trending, automated spike & seizure detection) were used as indicated.    Spike detection: ON  Seizure detection: ON   This  continuous  EEG monitoring with simultaneous video monitoring was performed for this patient with  NCSE to ensure resolution of seizures.  Day 1 : This recording begins background activities marked by nonreactive attenuated slowing with superimposed continuous to 2.5 cps broadly distributed spike and wave discharges maximum negativity left frontocentral cortex consistent with nonconvulsive status epilepticus.  With medication adjustment as recording continuous background activities evolve into suppressed nonreactive background activities with superimposed predominantly left frontocentral sharp waves and spike and wave discharges was brought negative field at times to given appearance of generalized spike and wave discharges.  At times such broad epileptiform discharges appear in short 3 to 5 seconds along evolving trains concerning for brief electrographic seizures.  Day 2: Burst suppression pattern was achieved during second half of the recording.   At this point ratio 3/7 sec respectively.  Left anterior frontal sharp waves and spikes present within burst of cerebral activities.  No seizures.  Day 3: Burst suppression was archived and maintained throughout the recording. . No seizure.   Clinical interpretation: This day 3 of continuous video EEG monitoring demonstrated resolution of seizures.  Burst suppression pattern was maintained throughout the recording. Continuous monitoring is recommended to ensure stability and resolution of seizures.  Clinical correlation is advised.

## 2019-03-21 NOTE — Progress Notes (Addendum)
Subjective: Sedation with Versed now with decreased rate of 10 mg/hr. Propofol also now at a lower rate of 10 mcg/kg/min.   Objective: Current vital signs: BP (!) 116/32   Pulse (!) 101   Temp 100 F (37.8 C)   Resp (!) 22   Ht '5\' 8"'$  (1.727 m)   Wt 110.5 kg   SpO2 91%   BMI 37.04 kg/m  Vital signs in last 24 hours: Temp:  [96.4 F (35.8 C)-100.2 F (37.9 C)] 100 F (37.8 C) (07/02 0700) Pulse Rate:  [65-111] 101 (07/02 0700) Resp:  [18-23] 22 (07/02 0700) BP: (75-116)/(32-58) 116/32 (07/02 0401) SpO2:  [87 %-94 %] 91 % (07/02 0700) Arterial Line BP: (103-129)/(33-47) 114/36 (07/02 0700) FiO2 (%):  [100 %] 100 % (07/02 0401) Weight:  [110.5 kg] 110.5 kg (07/02 0500)  Intake/Output from previous day: 07/01 0701 - 07/02 0700 In: 5920.9 [I.V.:4344.2; NG/GT:500; IV Piggyback:1076.7] Out: 2552 [Urine:2205; Emesis/NG output:25; Chest Tube:322] Intake/Output this shift: No intake/output data recorded. Nutritional status:  Diet Order            Diet NPO time specified  Diet effective now              Neurologic Exam: Sedated on Versed at rate of 10 and propofol at 10 Ment: No responses to any external stimuli.  CN: Pupils 2.5 mm and sluggishly reactive. No blink to threat. No oculocephalic reflex. Face flaccidly symmetric (sedated). No responses to auditory stimuli.  Motor/Sensory: Flaccid tone x 4 with no movement to noxious stimuli (sedated).  Reflexes: Areflexic x 4 (sedated)  Lab Results: Results for orders placed or performed during the hospital encounter of 03/11/2019 (from the past 48 hour(s))  Prepare Pheresed Platelets     Status: None   Collection Time: 03/19/19  7:55 AM  Result Value Ref Range   Unit Number [id number]    Blood Component Type PLTP LR1 PAS    Unit division 00    Status of Unit ISSUED,FINAL    Transfusion Status      OK TO TRANSFUSE Performed at [facility] [address] [place], [place] [address]   Glucose, capillary      Status: None   Collection Time: 03/19/19  7:55 AM  Result Value Ref Range   Glucose-Capillary 91 70 - 99 mg/dL  Glucose, capillary     Status: None   Collection Time: 03/19/19 11:26 AM  Result Value Ref Range   Glucose-Capillary 77 70 - 99 mg/dL  Glucose, capillary     Status: Abnormal   Collection Time: 03/19/19  1:49 PM  Result Value Ref Range   Glucose-Capillary 67 (L) 70 - 99 mg/dL  Glucose, capillary     Status: None   Collection Time: 03/19/19  3:52 PM  Result Value Ref Range   Glucose-Capillary 78 70 - 99 mg/dL  .Cooxemetry Panel (carboxy, met, total hgb, O2 sat)     Status: Abnormal   Collection Time: 03/19/19  4:40 PM  Result Value Ref Range   Total hemoglobin 11.7 (L) 12.0 - 16.0 g/dL   O2 Saturation 81.4 %   Carboxyhemoglobin 0.6 0.5 - 1.5 %   Methemoglobin 1.0 0.0 - 1.5 %  I-STAT 7, (LYTES, BLD GAS, ICA, H+H)     Status: Abnormal   Collection Time: 03/19/19  4:56 PM  Result Value Ref Range   pH, Arterial 7.302 (L) 7.350 - 7.450   pCO2 arterial 40.8 32.0 - 48.0 mmHg   pO2, Arterial 62.0 (L)  83.0 - 108.0 mmHg   Bicarbonate 20.4 20.0 - 28.0 mmol/L   TCO2 22 22 - 32 mmol/L   O2 Saturation 90.0 %   Acid-base deficit 6.0 (H) 0.0 - 2.0 mmol/L   Sodium 142 135 - 145 mmol/L   Potassium 3.5 3.5 - 5.1 mmol/L   Calcium, Ion 1.03 (L) 1.15 - 1.40 mmol/L   HCT 34.0 (L) 36.0 - 46.0 %   Hemoglobin 11.6 (L) 12.0 - 15.0 g/dL   Patient temperature 36.2 C    Sample type ARTERIAL   CBC     Status: Abnormal   Collection Time: 03/19/19  6:15 PM  Result Value Ref Range   WBC 13.2 (H) 4.0 - 10.5 K/uL   RBC 4.01 3.87 - 5.11 MIL/uL   Hemoglobin 11.7 (L) 12.0 - 15.0 g/dL   HCT 34.9 (L) 36.0 - 46.0 %   MCV 87.0 80.0 - 100.0 fL   MCH 29.2 26.0 - 34.0 pg   MCHC 33.5 30.0 - 36.0 g/dL   RDW 16.5 (H) 11.5 - 15.5 %   Platelets 46 (L) 150 - 400 K/uL    Comment: REPEATED TO VERIFY PLATELET COUNT CONFIRMED BY SMEAR SPECIMEN CHECKED FOR CLOTS Immature Platelet Fraction may be clinically  indicated, consider ordering this additional test [id number]    nRBC 1.2 (H) 0.0 - 0.2 %    Comment: Performed at [facility], [address], [place], [place] [address]  Basic metabolic panel     Status: Abnormal   Collection Time: 03/19/19  6:15 PM  Result Value Ref Range   Sodium 142 135 - 145 mmol/L   Potassium 4.0 3.5 - 5.1 mmol/L   Chloride 106 98 - 111 mmol/L   CO2 22 22 - 32 mmol/L   Glucose, Bld 121 (H) 70 - 99 mg/dL   BUN 45 (H) 8 - 23 mg/dL   Creatinine, Ser 3.91 (H) 0.44 - 1.00 mg/dL   Calcium 7.5 (L) 8.9 - 10.3 mg/dL   GFR calc non Af Amer 11 (L) >60 mL/min   GFR calc Af Amer 12 (L) >60 mL/min   Anion gap 14 5 - 15    Comment: Performed at [facility] [address], [place], [place] [address]  Glucose, capillary     Status: Abnormal   Collection Time: 03/19/19  7:58 PM  Result Value Ref Range   Glucose-Capillary 115 (H) 70 - 99 mg/dL  I-STAT 7, (LYTES, BLD GAS, ICA, H+H)     Status: Abnormal   Collection Time: 03/19/19  8:01 PM  Result Value Ref Range   pH, Arterial 7.295 (L) 7.350 - 7.450   pCO2 arterial 44.5 32.0 - 48.0 mmHg   pO2, Arterial 62.0 (L) 83.0 - 108.0 mmHg   Bicarbonate 21.9 20.0 - 28.0 mmol/L   TCO2 23 22 - 32 mmol/L   O2 Saturation 90.0 %   Acid-base deficit 5.0 (H) 0.0 - 2.0 mmol/L   Sodium 142 135 - 145 mmol/L   Potassium 3.7 3.5 - 5.1 mmol/L   Calcium, Ion 1.05 (L) 1.15 - 1.40 mmol/L   HCT 37.0 36.0 - 46.0 %   Hemoglobin 12.6 12.0 - 15.0 g/dL   Patient temperature 36.0 C    Sample type ARTERIAL   Glucose, capillary     Status: Abnormal   Collection Time: 03/20/19 12:36 AM  Result Value Ref Range   Glucose-Capillary 117 (H) 70 - 99 mg/dL  Comprehensive metabolic panel     Status: Abnormal  Collection Time: 03/20/19  4:20 AM  Result Value Ref Range   Sodium 142 135 - 145 mmol/L   Potassium 4.1 3.5 - 5.1 mmol/L   Chloride 105 98 - 111 mmol/L   CO2 22 22 - 32 mmol/L   Glucose, Bld 107 (H) 70 - 99 mg/dL   BUN 50 (H) 8 - 23  mg/dL   Creatinine, Ser 4.12 (H) 0.44 - 1.00 mg/dL   Calcium 7.5 (L) 8.9 - 10.3 mg/dL   Total Protein 3.9 (L) 6.5 - 8.1 g/dL   Albumin 2.2 (L) 3.5 - 5.0 g/dL   AST 191 (H) 15 - 41 U/L   ALT 101 (H) 0 - 44 U/L   Alkaline Phosphatase 69 38 - 126 U/L   Total Bilirubin 1.3 (H) 0.3 - 1.2 mg/dL   GFR calc non Af Amer 10 (L) >60 mL/min   GFR calc Af Amer 12 (L) >60 mL/min   Anion gap 15 5 - 15    Comment: Performed at [facility], [place] [address], [place], [place] [address]  CBC     Status: Abnormal   Collection Time: 03/20/19  4:20 AM  Result Value Ref Range   WBC 10.7 (H) 4.0 - 10.5 K/uL   RBC 4.06 3.87 - 5.11 MIL/uL   Hemoglobin 11.9 (L) 12.0 - 15.0 g/dL   HCT 35.1 (L) 36.0 - 46.0 %   MCV 86.5 80.0 - 100.0 fL   MCH 29.3 26.0 - 34.0 pg   MCHC 33.9 30.0 - 36.0 g/dL   RDW 16.9 (H) 11.5 - 15.5 %   Platelets 46 (L) 150 - 400 K/uL    Comment: REPEATED TO VERIFY Immature Platelet Fraction may be clinically indicated, consider ordering this additional test [id number] CONSISTENT WITH PREVIOUS RESULT    nRBC 2.8 (H) 0.0 - 0.2 %    Comment: Performed at [facility], [place] [address] [place], [place] [address]  Glucose, capillary     Status: None   Collection Time: 03/20/19  4:24 AM  Result Value Ref Range   Glucose-Capillary 93 70 - 99 mg/dL  I-STAT 7, (LYTES, BLD GAS, ICA, H+H)     Status: Abnormal   Collection Time: 03/20/19  5:48 AM  Result Value Ref Range   pH, Arterial 7.252 (L) 7.350 - 7.450   pCO2 arterial 52.0 (H) 32.0 - 48.0 mmHg   pO2, Arterial 63.0 (L) 83.0 - 108.0 mmHg   Bicarbonate 23.0 20.0 - 28.0 mmol/L   TCO2 25 22 - 32 mmol/L   O2 Saturation 88.0 %   Acid-base deficit 5.0 (H) 0.0 - 2.0 mmol/L   Sodium 141 135 - 145 mmol/L   Potassium 3.9 3.5 - 5.1 mmol/L   Calcium, Ion 1.07 (L) 1.15 - 1.40 mmol/L   HCT 37.0 36.0 - 46.0 %   Hemoglobin 12.6 12.0 - 15.0 g/dL   Patient temperature 36.9 C    Sample type ARTERIAL   .Cooxemetry Panel (carboxy, met, total  hgb, O2 sat)     Status: Abnormal   Collection Time: 03/20/19  5:59 AM  Result Value Ref Range   Total hemoglobin 11.6 (L) 12.0 - 16.0 g/dL   O2 Saturation 74.6 %   Carboxyhemoglobin 0.5 0.5 - 1.5 %   Methemoglobin 1.1 0.0 - 1.5 %  Glucose, capillary     Status: Abnormal   Collection Time: 03/20/19  7:59 AM  Result Value Ref Range   Glucose-Capillary 105 (H) 70 - 99 mg/dL  I-STAT  7, (LYTES, BLD GAS, ICA, H+H)     Status: Abnormal   Collection Time: 03/20/19  8:01 AM  Result Value Ref Range   pH, Arterial 7.289 (L) 7.350 - 7.450   pCO2 arterial 47.1 32.0 - 48.0 mmHg   pO2, Arterial 55.0 (L) 83.0 - 108.0 mmHg   Bicarbonate 22.6 20.0 - 28.0 mmol/L   TCO2 24 22 - 32 mmol/L   O2 Saturation 84.0 %   Acid-base deficit 4.0 (H) 0.0 - 2.0 mmol/L   Sodium 140 135 - 145 mmol/L   Potassium 3.8 3.5 - 5.1 mmol/L   Calcium, Ion 1.06 (L) 1.15 - 1.40 mmol/L   HCT 37.0 36.0 - 46.0 %   Hemoglobin 12.6 12.0 - 15.0 g/dL   Patient temperature HIDE    Sample type ARTERIAL   Glucose, capillary     Status: Abnormal   Collection Time: 03/20/19 11:06 AM  Result Value Ref Range   Glucose-Capillary 100 (H) 70 - 99 mg/dL  Pathologist smear review     Status: None   Collection Time: 03/20/19  1:46 PM  Result Value Ref Range   Path Review Reviewed By Violet Baldy, M.D.     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 WORRISOME FOR HEMOLYTIC PROCESS.  THROMBOCYTOPENIA, AND LEUKOCYTOSIS. Performed at [facility], [place] [address], [place], [place] [address]   Basic metabolic panel     Status: Abnormal   Collection Time: 03/20/19  4:30 PM  Result Value Ref Range   Sodium 141 135 - 145 mmol/L   Potassium 3.6 3.5 - 5.1 mmol/L   Chloride 102 98 - 111 mmol/L   CO2 21 (L) 22 - 32 mmol/L   Glucose, Bld 118 (H) 70 - 99 mg/dL   BUN 54 (H) 8 - 23 mg/dL   Creatinine, Ser 4.12 (H) 0.44 - 1.00 mg/dL   Calcium 7.3 (L) 8.9 - 10.3 mg/dL   GFR calc non Af Amer 10 (L) >60 mL/min   GFR  calc Af Amer 12 (L) >60 mL/min   Anion gap 18 (H) 5 - 15    Comment: Performed at [facility] [address] [place], [place] [address]  Valproic acid level     Status: None   Collection Time: 03/20/19  4:30 PM  Result Value Ref Range   Valproic Acid Lvl 66 50.0 - 100.0 ug/mL    Comment: Performed at [facility] [address] [place], [place] [address]  Glucose, capillary     Status: Abnormal   Collection Time: 03/20/19  4:31 PM  Result Value Ref Range   Glucose-Capillary 114 (H) 70 - 99 mg/dL  I-STAT 7, (LYTES, BLD GAS, ICA, H+H)     Status: Abnormal   Collection Time: 03/20/19  4:33 PM  Result Value Ref Range   pH, Arterial 7.252 (L) 7.350 - 7.450   pCO2 arterial 51.9 (H) 32.0 - 48.0 mmHg   pO2, Arterial 61.0 (L) 83.0 - 108.0 mmHg   Bicarbonate 22.8 20.0 - 28.0 mmol/L   TCO2 24 22 - 32 mmol/L   O2 Saturation 86.0 %   Acid-base deficit 5.0 (H) 0.0 - 2.0 mmol/L   Sodium 140 135 - 145 mmol/L   Potassium 3.5 3.5 - 5.1 mmol/L   Calcium, Ion 1.01 (L) 1.15 - 1.40 mmol/L   HCT 40.0 36.0 - 46.0 %   Hemoglobin 13.6 12.0 - 15.0 g/dL   Patient temperature HIDE    Sample type ARTERIAL   .  Cooxemetry Panel (carboxy, met, total hgb, O2 sat)     Status: Abnormal   Collection Time: 03/20/19  4:50 PM  Result Value Ref Range   Total hemoglobin 6.9 (LL) 12.0 - 16.0 g/dL    Comment: CRITICAL RESULT CALLED TO, READ BACK BY AND VERIFIED WITH: JAIMEE LONG, RN [place], RRT ON 03/20/2019.    O2 Saturation 78.5 %   Carboxyhemoglobin 1.2 0.5 - 1.5 %   Methemoglobin 0.9 0.0 - 1.5 %  .Cooxemetry Panel (carboxy, met, total hgb, O2 sat)     Status: Abnormal   Collection Time: 03/20/19  5:28 PM  Result Value Ref Range   Total hemoglobin 11.2 (L) 12.0 - 16.0 g/dL   O2 Saturation 79.8 %   Carboxyhemoglobin 0.6 0.5 - 1.5 %   Methemoglobin 1.0 0.0 - 1.5 %  Procalcitonin - Baseline     Status: None   Collection Time: 03/20/19  5:31 PM  Result Value Ref Range    Procalcitonin 2.90 ng/mL    Comment:        Interpretation: PCT > 2 ng/mL: Systemic infection (sepsis) is likely, unless other causes are known. (NOTE)       Sepsis PCT Algorithm           Lower Respiratory Tract                                      Infection PCT Algorithm    ----------------------------     ----------------------------         PCT < 0.25 ng/mL                PCT < 0.10 ng/mL         Strongly encourage             Strongly discourage   discontinuation of antibiotics    initiation of antibiotics    ----------------------------     -----------------------------       PCT 0.25 - 0.50 ng/mL            PCT 0.10 - 0.25 ng/mL               OR       >80% decrease in PCT            Discourage initiation of                                            antibiotics      Encourage discontinuation           of antibiotics    ----------------------------     -----------------------------         PCT >= 0.50 ng/mL              PCT 0.26 - 0.50 ng/mL               AND       <80% decrease in PCT              Encourage initiation of                                             antibiotics  Encourage continuation           of antibiotics    ----------------------------     -----------------------------        PCT >= 0.50 ng/mL                  PCT > 0.50 ng/mL               AND         increase in PCT                  Strongly encourage                                      initiation of antibiotics    Strongly encourage escalation           of antibiotics                                     -----------------------------                                           PCT <= 0.25 ng/mL                                                 OR                                        > 80% decrease in PCT                                     Discontinue / Do not initiate                                             antibiotics Performed at [facility], [address] [place],  [place] [address]   Glucose, capillary     Status: Abnormal   Collection Time: 03/20/19  7:44 PM  Result Value Ref Range   Glucose-Capillary 106 (H) 70 - 99 mg/dL  Glucose, capillary     Status: Abnormal   Collection Time: 03/20/19 11:16 PM  Result Value Ref Range   Glucose-Capillary 104 (H) 70 - 99 mg/dL  I-STAT 7, (LYTES, BLD GAS, ICA, H+H)     Status: Abnormal   Collection Time: 03/21/19  4:15 AM  Result Value Ref Range   pH, Arterial 7.225 (L) 7.350 - 7.450   pCO2 arterial 53.0 (H) 32.0 - 48.0 mmHg   pO2, Arterial 65.0 (L) 83.0 - 108.0 mmHg   Bicarbonate 21.8 20.0 - 28.0 mmol/L   TCO2 23 22 - 32 mmol/L   O2 Saturation 87.0 %   Acid-base deficit 6.0 (H) 0.0 - 2.0 mmol/L   Sodium 141 135 - 145 mmol/L   Potassium 3.3 (L) 3.5 - 5.1  mmol/L   Calcium, Ion 1.03 (L) 1.15 - 1.40 mmol/L   HCT 39.0 36.0 - 46.0 %   Hemoglobin 13.3 12.0 - 15.0 g/dL   Patient temperature 37.5 C    Collection site ARTERIAL LINE    Drawn by Nurse    Sample type ARTERIAL   Comprehensive metabolic panel     Status: Abnormal   Collection Time: 03/21/19  4:17 AM  Result Value Ref Range   Sodium 140 135 - 145 mmol/L   Potassium 3.4 (L) 3.5 - 5.1 mmol/L   Chloride 103 98 - 111 mmol/L   CO2 20 (L) 22 - 32 mmol/L   Glucose, Bld 119 (H) 70 - 99 mg/dL   BUN 55 (H) 8 - 23 mg/dL   Creatinine, Ser 4.02 (H) 0.44 - 1.00 mg/dL   Calcium 7.1 (L) 8.9 - 10.3 mg/dL   Total Protein 3.8 (L) 6.5 - 8.1 g/dL   Albumin 1.9 (L) 3.5 - 5.0 g/dL   AST 222 (H) 15 - 41 U/L   ALT 105 (H) 0 - 44 U/L   Alkaline Phosphatase 93 38 - 126 U/L   Total Bilirubin 1.9 (H) 0.3 - 1.2 mg/dL   GFR calc non Af Amer 10 (L) >60 mL/min   GFR calc Af Amer 12 (L) >60 mL/min   Anion gap 17 (H) 5 - 15    Comment: Performed at [facility], [address], [place], [place] [address]  Valproic acid level     Status: None   Collection Time: 03/21/19  4:17 AM  Result Value Ref Range   Valproic Acid Lvl 58 50.0 - 100.0 ug/mL    Comment: Performed at  [facility] [address], [place], [place] [address]  CBC     Status: Abnormal   Collection Time: 03/21/19  4:17 AM  Result Value Ref Range   WBC 10.4 4.0 - 10.5 K/uL   RBC 3.99 3.87 - 5.11 MIL/uL   Hemoglobin 11.8 (L) 12.0 - 15.0 g/dL   HCT 34.1 (L) 36.0 - 46.0 %   MCV 85.5 80.0 - 100.0 fL   MCH 29.6 26.0 - 34.0 pg   MCHC 34.6 30.0 - 36.0 g/dL   RDW 17.2 (H) 11.5 - 15.5 %   Platelets 41 (L) 150 - 400 K/uL    Comment: REPEATED TO VERIFY Immature Platelet Fraction may be clinically indicated, consider ordering this additional test [id number] CONSISTENT WITH PREVIOUS RESULT    nRBC 5.3 (H) 0.0 - 0.2 %    Comment: Performed at [facility], [place] [address], [place], [place] [address]  Glucose, capillary     Status: Abnormal   Collection Time: 03/21/19  4:19 AM  Result Value Ref Range   Glucose-Capillary 105 (H) 70 - 99 mg/dL  .Cooxemetry Panel (carboxy, met, total hgb, O2 sat)     Status: Abnormal   Collection Time: 03/21/19  4:32 AM  Result Value Ref Range   Total hemoglobin 11.5 (L) 12.0 - 16.0 g/dL   O2 Saturation 73.3 %   Carboxyhemoglobin 0.6 0.5 - 1.5 %   Methemoglobin 1.2 0.0 - 1.5 %    Recent Results (from the past 240 hour(s))  SARS Coronavirus 2 (Hosp order,Performed in [facility] lab via [employer] ID)     Status: None   Collection Time: 02/26/2019  6:04 PM   Specimen: Dry Nasal Swab (Abbott ID Now)  Result Value Ref Range Status   SARS Coronavirus 2 (A[employer]ott ID  Now) NEGATIVE NEGATIVE Final    Comment: (NOTE) Interpretive Result Comment(s): COVID 19 Positive SARS CoV 2 target nucleic acids are DETECTED. The SARS CoV 2 RNA is generally detectable in upper and lower respiratory specimens during the acute phase of infection.  Positive results are indicative of active infection with SARS CoV 2.  Clinical correlation with patient history and other diagnostic information is necessary to determine patient infection status.  Positive results do not rule out  bacterial infection or coinfection with other viruses. The expected result is Negative. COVID 19 Negative SARS CoV 2 target nucleic acids are NOT DETECTED. The SARS CoV 2 RNA is generally detectable in upper and lower respiratory specimens during the acute phase of infection.  Negative results do not preclude SARS CoV 2 infection, do not rule out coinfections with other pathogens, and should not be used as the sole basis for treatment or other patient management decisions.  Negative results must be combined with clinical  observations, patient history, and epidemiological information. The expected result is Negative. Invalid Presence or absence of SARS CoV 2 nucleic acids cannot be determined. Repeat testing was performed on the submitted specimen and repeated Invalid results were obtained.  If clinically indicated, additional testing on a new specimen with an alternate test methodology [phone]) is advised.  The SARS CoV 2 RNA is generally detectable in upper and lower respiratory specimens during the acute phase of infection. The expected result is Negative. Fact Sheet for Patients:  GolfingFamily.no Fact Sheet for Healthcare Providers: https://www.hernandez-brewer.com/ This test is not yet approved or cleared by the [place] FDA and has been authorized for detection and/or diagnosis of SARS CoV 2 by FDA under an Emergency Use Authorization (EUA).  This EUA will remain in effect (meaning this test can be used) for the duration of the COVID19 d eclaration under Section 564(b)(1) of the Act, 21 U.S.C. section [phone] 3(b)(1), unless the authorization is terminated or revoked sooner. Performed at [facility], [place] [place], [place] [address]   Culture, respiratory (non-expectorated)     Status: None   Collection Time: 03/17/19 11:37 AM   Specimen: Tracheal Aspirate; Respiratory  Result Value Ref Range Status   Specimen  Description TRACHEAL ASPIRATE  Final   Special Requests Normal  Final   Gram Stain   Final    FEW WBC PRESENT, PREDOMINANTLY MONONUCLEAR NO ORGANISMS SEEN    Culture   Final    NO GROWTH 2 DAYS Performed at [facility], [address] [place], [place] [address]    Report Status 03/19/2019 FINAL  Final  Surgical pcr screen     Status: Abnormal   Collection Time: 03/18/19  5:09 AM   Specimen: Nasal Mucosa; Nasal Swab  Result Value Ref Range Status   MRSA, PCR NEGATIVE NEGATIVE Final   Staphylococcus aureus POSITIVE (A) NEGATIVE Final    Comment: (NOTE) The Xpert SA Assay (FDA approved for NASAL specimens in patients 29 years of age and older), is one component of a comprehensive surveillance program. It is not intended to diagnose infection nor to guide or monitor treatment. Performed at [facility], [place] [address], [place], [place] [address]     Lipid Panel Recent Labs    03/18/19 2310  TRIG 131    Studies/Results: Ct Head Wo Contrast  Result Date: 03/19/2019 CLINICAL DATA:  Focal neuro deficit, anoxic brain injury, stroke suspected EXAM: CT HEAD WITHOUT CONTRAST TECHNIQUE: Contiguous axial images were obtained  from the base of the skull through the vertex without intravenous contrast. COMPARISON:  MR brain, 02/04/2017 FINDINGS: Brain: Evaluation of the brain parenchyma is somewhat limited by overlying monitoring lead streak artifact. There is cortical and subcortical hypodensity, most conspicuous in the right hemisphere and generally in a watershed distribution (series 3, image 18, 23). There is underlying periventricular and deep white matter hypodensity. Vascular: No hyperdense vessel or unexpected calcification. Skull: Normal. Negative for fracture or focal lesion. Sinuses/Orbits: No acute finding. Other: None. IMPRESSION: Evaluation of the brain parenchyma is somewhat limited by overlying monitoring lead streak artifact. There is cortical and subcortical  hypodensity, most conspicuous in the right hemisphere and generally in a watershed distribution (series 3, image 18, 23). There is underlying periventricular and deep white matter hypodensity. Findings are concerning for hypoperfusion ischemia involving the right hemisphere. There is no definite evidence of left hemispheric involvement. MRI may be used to more sensitively evaluate for acute diffusion restricting infarction if desired. Electronically Signed   By: Eddie Candle M.D.   On: 03/19/2019 11:13   Dg Chest Port 1 View  Result Date: 03/20/2019 CLINICAL DATA:  Status post aortic dissection repair EXAM: PORTABLE CHEST 1 VIEW COMPARISON:  03/19/2019 FINDINGS: No change in AP portable examination status post median sternotomy, with bilateral chest and mediastinal drainage tubes, endotracheal tube, esophagogastric tube, and right neck pulmonary arterial catheter. Small layering bilateral pleural effusions and edema. No significant pneumothorax. Esophagogastric tube remains positioned with tip near the gastroesophageal junction and side port over the lower esophagus. IMPRESSION: 1. No change in AP portable examination status post median sternotomy, with bilateral chest and mediastinal drainage tubes, endotracheal tube, esophagogastric tube, and right neck pulmonary arterial catheter. Small layering bilateral pleural effusions and edema. No significant pneumothorax. 2. Esophagogastric tube remains positioned with tip near the gastroesophageal junction and side port over the lower esophagus. Consider advancement to ensure subdiaphragmatic position. Electronically Signed   By: Eddie Candle M.D.   On: 03/20/2019 09:06    Medications:  Scheduled: . sodium chloride   Intravenous Once  . acetaminophen (TYLENOL) oral liquid 160 mg/5 mL  1,000 mg Per Tube Q6H  . bisacodyl  10 mg Oral Daily   Or  . bisacodyl  10 mg Rectal Daily  . chlorhexidine gluconate (MEDLINE KIT)  15 mL Mouth Rinse BID  . Chlorhexidine  Gluconate Cloth  6 each Topical Q0600  . docusate  200 mg Per Tube Daily  . insulin aspart  0-24 Units Subcutaneous Q4H  . levalbuterol  0.63 mg Nebulization TID  . mouth rinse  15 mL Mouth Rinse 10 times per day  . mupirocin cream   Topical BID  . pantoprazole (PROTONIX) IV  40 mg Intravenous Q24H  . sodium chloride flush  3 mL Intravenous Q12H  . vancomycin variable dose per unstable renal function (pharmacist dosing)   Does not apply See admin instructions   Continuous: . sodium chloride Stopped (03/18/19 1754)  . sodium chloride    . sodium chloride 10 mL/hr at 03/21/19 0700  . amiodarone 60 mg/hr (03/21/19 0700)  . ceFEPime (MAXIPIME) IV Stopped (03/20/19 1845)  . dextrose 20 mL/hr at 03/21/19 0700  . epinephrine 3 mcg/min (03/21/19 0700)  . feeding supplement (VITAL 1.5 CAL) 20 mL/hr at 03/21/19 0700  . furosemide (LASIX) infusion 15 mg/hr (03/21/19 0830)  . heparin    . lactated ringers Stopped (03/20/19 0541)  . lactated ringers 10 mL/hr at 03/21/19 0700  . levETIRAcetam Stopped (03/20/19 2201)  .  midazolam (VERSED) infusion 30 mg/hr (03/21/19 0815)  . milrinone 0.125 mcg/kg/min (03/21/19 0700)  . norepinephrine (LEVOPHED) Adult infusion 15 mcg/min (03/21/19 0830)  . propofol (DIPRIVAN) infusion 25 mcg/kg/min (03/21/19 0700)  . valproate sodium Stopped (03/21/19 0655)    LTM EEG report, 7/1: Beginning time: 03/19/19 at 0730 Ending time:  7/1//20 at 0730 Clinical interpretation: This day 2 of continuous video EEG monitoring demonstrated resolution of seizures.  Burst suppression pattern was achieved.  Left anterior frontal sharp waves and spikes still present and suggestive of cortical irritability in that region.  Continuous monitoring is recommended to ensure stability and resolution of seizures.  Clinical correlation is advised.  Assessment:73 year old female in status epilepticus, initially converted to GPEDs with Keppra, valproic acid and Versed gtt. GPEDs now resolved  with achievement of burst suppression pattern; there are still left anterior frontal sharp waves and spikes, suggestive of cortical irritability in that region. 1.New onset seizures with status epilepticus. Most likely secondary to either anoxic brain injury, perioperative stroke or a combination. 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 Most likely was due to hypoperfusion versus in situ thrombosis or distal embolization from the aortic arch occurring during the procedure.  2. The GPEDs seen on initial LTM EEG tracings are felt most likely to be due to diffuse injury of the cerebral cortices and/or thalami.  3. EEG tracings today reveal burst suppression pattern with low voltage bursts on a flat background.  4. VPA level this AM is therapeutic at 58  Recommendations: 1.Continue propofol at a rate of 10 mcg/kg/min 2. Stop Versed 3.Continue Keppra and valproic acid scheduled dosing.  4. She is not able to tolerate MRI currently per Cardiothoracic Surgery team.  5. Continue LTM.  6. Plan is to repeat exam today after Versed has been stopped, then if she is stable assess her exam off propofol.  45 minutes spent in the evaluation and management of this critically ill patient. Time included EEG review and extensive discussion of prognosis and further diagnostic and management plan with family. All questions answered.     Addendum: -- LTM EEG report from this morning is available: Clinical interpretation: This day 3 of continuous video EEG monitoring demonstrated resolution of seizures.  Burst suppression pattern was maintained  throughout the recording.  -- Patient re-examined at 2:30 PM. No cortically-mediated responses noted. Pupils equal and unreactive. No oculocephalic reflex. Limbs flaccid x 4 without movement to noxious. EEG shows flat low voltage background with no electrographic seizures seen. Rare bursts are noted. Recommendations: Stopping propofol. Will continue to reassess clinically and on EEG.   Additional 10 minutes of ICU time.    LOS: 6 days   '@Electronically'$  signed: Dr. Kerney Elbe 03/21/2019  7:36 AM

## 2019-03-21 NOTE — Progress Notes (Signed)
[facility] DAILY ICU PROGRESS NOTE                   [place].[address]            [place] [address]          [phone]   6 Days Post-Op Procedure(s) (LRB): REPLACEMENT OF ASCENDING AORTA USING HEMASHIELD PLATINUM 28MM and 30MM VASCULAR GRAFT (N/A)  Total Length of Stay:  LOS: 6 days   Subjective: Remains on vent, sedated  Objective: Vital signs in last 24 hours: Temp:  [96.4 F (35.8 C)-100.2 F (37.9 C)] 100 F (37.8 C) (07/02 0700) Pulse Rate:  [92-111] 101 (07/02 0700) Cardiac Rhythm: Normal sinus rhythm (07/02 0400) Resp:  [18-23] 22 (07/02 0700) BP: (75-116)/(32-55) 116/32 (07/02 0401) SpO2:  [87 %-94 %] 91 % (07/02 0700) Arterial Line BP: (103-129)/(33-42) 114/36 (07/02 0700) FiO2 (%):  [100 %] 100 % (07/02 0401) Weight:  [110.5 kg] 110.5 kg (07/02 0500)  Filed Weights   03/19/19 0500 03/20/19 0500 03/21/19 0500  Weight: 105 kg 108.4 kg 110.5 kg    Weight change: 2.1 kg   Hemodynamic parameters for last 24 hours: PAP: (18-26)/(13-21) 25/20 CVP:  [10 mmHg-19 mmHg] 16 mmHg CO:  [5.6 L/min-5.8 L/min] 5.8 L/min CI:  [2.9 L/min/m2-3 L/min/m2] 3 L/min/m2  Intake/Output from previous day: 07/01 0701 - 07/02 0700 In: 5920.9 [I.V.:4344.2; NG/GT:500; IV Piggyback:1076.7] Out: 2552 [Urine:2205; Emesis/NG output:25; Chest Tube:322]  Intake/Output this shift: No intake/output data recorded.  Current Meds: Scheduled Meds: . sodium chloride   Intravenous Once  . acetaminophen (TYLENOL) oral liquid 160 mg/5 mL  1,000 mg Per Tube Q6H  . bisacodyl  10 mg Oral Daily   Or  . bisacodyl  10 mg Rectal Daily  . chlorhexidine gluconate (MEDLINE KIT)  15 mL Mouth Rinse BID  . Chlorhexidine Gluconate Cloth  6 each Topical Q0600  . docusate  200 mg Per Tube Daily  . furosemide  80 mg Intravenous Once  . insulin aspart  0-24 Units Subcutaneous Q4H  . levalbuterol  0.63 mg Nebulization TID  . mouth rinse  15 mL Mouth Rinse 10 times per day  . mupirocin cream    Topical BID  . pantoprazole (PROTONIX) IV  40 mg Intravenous Q24H  . sodium bicarbonate  50 mEq Intravenous Once  . sodium chloride flush  3 mL Intravenous Q12H  . vancomycin variable dose per unstable renal function (pharmacist dosing)   Does not apply See admin instructions   Continuous Infusions: . sodium chloride Stopped (03/18/19 1754)  . sodium chloride    . sodium chloride 10 mL/hr at 03/21/19 0700  . amiodarone 60 mg/hr (03/21/19 0700)  . ceFEPime (MAXIPIME) IV Stopped (03/20/19 1845)  . dextrose 20 mL/hr at 03/21/19 0700  . epinephrine 3 mcg/min (03/21/19 0700)  . feeding supplement (VITAL 1.5 CAL) 20 mL/hr at 03/21/19 0700  . furosemide (LASIX) infusion 15 mg/hr (03/21/19 0700)  . lactated ringers Stopped (03/20/19 0541)  . lactated ringers 10 mL/hr at 03/21/19 0700  . levETIRAcetam Stopped (03/20/19 2201)  . midazolam (VERSED) infusion 60 mg/hr (03/21/19 0700)  . milrinone 0.125 mcg/kg/min (03/21/19 0700)  . norepinephrine (LEVOPHED) Adult infusion 10 mcg/min (03/21/19 0700)  . propofol (DIPRIVAN) infusion 25 mcg/kg/min (03/21/19 0700)  . valproate sodium Stopped (03/21/19 0655)   PRN Meds:.sodium chloride, metoprolol tartrate, midazolam, morphine injection, ondansetron (ZOFRAN) IV, oxyCODONE, sodium chloride flush, traMADol  General: She remains sedated on vent Heart: Tachycardic Lungs: Coarse breath sounds  bilaterally Abdomen: Soft, sporadic bowel sounds Extremities: Diffuse upper extremity edema. Minor mottling of both tips of toes Wound: Sternal wound is clean and dry. Right axillary wound with staples intact and wound is clean and dry  Lab Results: CBC: Recent Labs    03/20/19 0420  03/21/19 0415 03/21/19 0417  WBC 10.7*  --   --  10.4  HGB 11.9*   < > 13.3 11.8*  HCT 35.1*   < > 39.0 34.1*  PLT 46*  --   --  41*   < > = values in this interval not displayed.   BMET:  Recent Labs    03/20/19 1630  03/21/19 0415 03/21/19 0417  NA 141   < > 141 140   K 3.6   < > 3.3* 3.4*  CL 102  --   --  103  CO2 21*  --   --  20*  GLUCOSE 118*  --   --  119*  BUN 54*  --   --  55*  CREATININE 4.12*  --   --  4.02*  CALCIUM 7.3*  --   --  7.1*   < > = values in this interval not displayed.    CMET: Lab Results  Component Value Date   WBC 10.4 03/21/2019   HGB 11.8 (L) 03/21/2019   HCT 34.1 (L) 03/21/2019   PLT 41 (L) 03/21/2019   GLUCOSE 119 (H) 03/21/2019   CHOL 189 02/22/2018   TRIG 131 03/18/2019   HDL 64.20 02/22/2018   LDLCALC 101 (H) 02/22/2018   ALT 105 (H) 03/21/2019   AST 222 (H) 03/21/2019   NA 140 03/21/2019   K 3.4 (L) 03/21/2019   CL 103 03/21/2019   CREATININE 4.02 (H) 03/21/2019   BUN 55 (H) 03/21/2019   CO2 20 (L) 03/21/2019   TSH 2.77 02/22/2018   INR 1.4 (H) 03/16/2019   HGBA1C 5.7 02/22/2018    PT/INR:  No results for input(s): LABPROT, INR in the last 72 hours. Radiology: Dg Chest Port 1 View  Result Date: 03/21/2019 CLINICAL DATA:  Status post aortic dissection repair EXAM: PORTABLE CHEST 1 VIEW COMPARISON:  03/20/2019 FINDINGS: Endotracheal tube, gastric catheter and bilateral thoracostomy catheters are again seen and stable. Nasogastric catheter extends to the distal esophagus but not into the stomach. Again this could be advanced several cm. Swan-Ganz catheter and mediastinal drain are noted in satisfactory position. Increased central vascular congestion and interstitial edema is noted. No sizable effusions are seen. Postsurgical changes are again noted. IMPRESSION: Increased changes of CHF. Tubes and lines as described stable in appearance from the prior exam. The nasogastric catheter could be advanced several cm as necessary. Electronically Signed   By: Inez Catalina M.D.   On: 03/21/2019 08:14    Assessment/Plan: S/P Procedure(s) (LRB): REPLACEMENT OF ASCENDING AORTA USING HEMASHIELD PLATINUM 28MM and 30MM VASCULAR GRAFT (N/A) 1. CV-Previous a fib;converted to SR yesterday. ST in the low 100's this am. On  Amiodarone, Epinephrine at 3, Milrinone at 0.125, and Nor epinephrine drips. Co ox this am increased to 73.3 (and Milrinone was decreased yesterday). As discussed with Dr. Prescott Gum, will increase Nor epi to 15 and decrease Versed by half to help with hypotension. 2. Pulmonary- Most lood gas results noted (PH=7.268,PCO2 45, PO2 57) . Around 3-4 am, peep up to 10 and rate increased to 22. FIO2 100% this am. Per Dr. Prescott Gum try recruitment. CCM to management vent. Chest tubes with 322 ml output last  24 hours (140 ml last 12 hours). CXR this am appears to show patient very rotated to the right and bibasilar opacities 3. Neurology-Per neurology, continuous EEG . CT of head done previously showed watershed distribution and concern for hypoperfusion ischemia involving the right hemisphere. On Keppra and Valproate 4. Acute renal failure-creatinine slightly decreased to 4.02  this am. Urine output around 80 cc/ hr. On Lasix drip for volume overload at 15 ml/hr 5. Thrombocytopenia-platelets slightly decreased to 41,000 and she did receive platelets previously. Not on Heparin or heparin like products but is on ecasa 325..  6. ID-WBC slightly decreased to 10,400. On Vancomycin and Cefepime.  Respiratory culture showed no growth 2 days 7. Elevated transaminases-AST 222, ALT 105 likely related to shock 8. Bactroban as Staph Aureus on surgical screen 9. CBGs 106/104/105. -She previously had persistent hypoglycemia yesterday so put on D5 W which has helped 10. Neurologic status to be further determined. Hopefully, lighten sedation and may need follow up CT head.  Donielle Liston Alba PA-C 03/21/2019 8:20 AM

## 2019-03-21 NOTE — Progress Notes (Signed)
     [address]       [place] [address]             [phone]      No major changes  Vitals:   03/21/19 1800 03/21/19 1815  BP:    Pulse: (!) 101 (!) 103  Resp: (!) 22 (!) 22  Temp: 99.3 F (37.4 C) 99.3 F (37.4 C)  SpO2: 91% 90%   PAP: (20-25)/(14-20) 22/17 CVP:  [10 mmHg-16 mmHg] 15 mmHg CO:  [5.6 L/min-6 L/min] 6 L/min CI:  [2.9 L/min/m2-3.1 L/min/m2] 3.1 L/min/m2  I/O last 3 completed shifts: In: 7885.7 [I.V.:5812.3; NG/GT:760; IV Piggyback:1313.5] Out: 3022 [Urine:2565; Emesis/NG output:25; Chest Tube:432]   CBC    Component Value Date/Time   WBC 10.4 03/21/2019 0417   RBC 3.99 03/21/2019 0417   HGB 12.6 03/21/2019 1742   HCT 37.0 03/21/2019 1742   PLT 41 (L) 03/21/2019 0417   MCV 85.5 03/21/2019 0417   MCH 29.6 03/21/2019 0417   MCHC 34.6 03/21/2019 0417   RDW 17.2 (H) 03/21/2019 0417   LYMPHSABS 1.4 03/05/2019 1200   MONOABS 1.0 02/26/2019 1200   EOSABS 0.0 02/24/2019 1200   BASOSABS 0.0 02/23/2019 1200   BMP Latest Ref Rng & Units 03/21/2019 03/21/2019 03/21/2019  Glucose 70 - 99 mg/dL - 102(H) -  BUN 8 - 23 mg/dL - 60(H) -  Creatinine 0.44 - 1.00 mg/dL - 4.17(H) -  BUN/Creat Ratio 12 - 28 - - -  Sodium 135 - 145 mmol/L 138 140 143  Potassium 3.5 - 5.1 mmol/L 3.1(L) 3.2(L) 2.7(LL)  Chloride 98 - 111 mmol/L - 102 -  CO2 22 - 32 mmol/L - 20(L) -  Calcium 8.9 - 10.3 mg/dL - 7.1(L) -

## 2019-03-21 NOTE — Progress Notes (Signed)
   03/21/19 1300  Clinical Encounter Type  Visited With Patient;Health care provider  Visit Type Follow-up  Referral From Nurse;Family  Spiritual Encounters  Spiritual Needs Grief support;Emotional;Prayer  Stress Factors  Patient Stress Factors Not reviewed  Family Stress Factors Loss of control;Major life changes   Family requested this chaplain visit as they were present at bedside.  Met w/ daughter Apolonio Schneiders, son Clint, and pt's husband.  Prayed w/ family and pt.  Let family know to ask RN to pg me (or on-call chaplain overnight) for further support.  Myra Gianotti resident, [phone]

## 2019-03-21 NOTE — Progress Notes (Signed)
   03/21/19 1323  Clinical Encounter Type  Visited With Family;Patient  Visit Type Initial;Spiritual support;Psychological support  Referral From Other (Comment);Chaplain (friend of pt's family)  Spiritual Encounters  Spiritual Needs Sacred text;Emotional;Grief support  Stress Factors  Patient Stress Factors Major life changes  Family Stress Factors Family relationships;Loss of control;Major life changes   *late entry*  Received referral from [place]. at [facility], who had rcvd request from friend of the pt's family.  Met w/ pt, called pt's daughter, Apolonio Schneiders, for empathetic listening.  Used phone # listed in pt's contact info, supported her grief at her mother's condition and at not being able to be physically present.  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  Returned and spoke again to pt, read half of 2 Thessalonians to pt.  Pls pg for add'l chaplain support.  Myra Gianotti resident, [phone]

## 2019-03-21 NOTE — Progress Notes (Signed)
Pt K is 3.4 this AM, however unable to replace per protocol d/t pt creatinine 4.02.  Sherlie Ban, RN

## 2019-03-21 NOTE — Progress Notes (Signed)
ANTICOAGULATION CONSULT NOTE - Initial Consult  Pharmacy Consult for heparin Indication: r/o VTE  Allergies  Allergen Reactions  . Ivp Dye [Iodinated Diagnostic Agents]     Rash , hives  . Codeine Itching    Unless something given to counteract itching    Patient Measurements: Height: 5\' 8"  (172.7 cm) Weight: 243 lb 9.7 oz (110.5 kg) IBW/kg (Calculated) : 63.9 Heparin Dosing Weight: 80kg  Vital Signs: Temp: 99.3 F (37.4 C) (07/02 1815) BP: 81/44 (07/02 1600) Pulse Rate: 103 (07/02 1815)  Labs: Recent Labs    03/19/19 1815  03/20/19 0420  03/20/19 1630  03/21/19 0417 03/21/19 0745 03/21/19 0841 03/21/19 1124 03/21/19 1427 03/21/19 1545 03/21/19 1742 03/21/19 1754  HGB 11.7*   < > 11.9*   < >  --    < > 11.8* 12.6  --  12.2  --   --  12.6  --   HCT 34.9*   < > 35.1*   < >  --    < > 34.1* 37.0  --  36.0  --   --  37.0  --   PLT 46*  --  46*  --   --   --  41*  --   --   --   --   --   --   --   APTT  --   --   --   --   --   --   --   --  36  --  56*  --   --   --   HEPARINUNFRC  --   --   --   --   --   --   --   --   --   --   --   --   --  <0.10*  CREATININE 3.91*  --  4.12*  --  4.12*  --  4.02*  --   --   --   --  4.17*  --   --    < > = values in this interval not displayed.    Estimated Creatinine Clearance: 15.6 mL/min (A) (by C-G formula based on SCr of 4.17 mg/dL (H)).  Assessment: 73 year old female s/p aortic dissection repair 6/26. Patient has had a complicated stay. Now with possible VTE, hgb down slightly this morning to 11.8, plt are low but stable in 40s.   New orders to start heparin at 800 units/hr (will use admit wt of 80 kg as dosing wt, patient profusely volume overloaded currently).  Pharmacy asked to keep at lowest therapeutic goal. Will aim for heparin level of 0.3  Hep lvl undetectable   Goal of Therapy:  Heparin level ~0.3 Monitor platelets by anticoagulation protocol: Yes   Plan:  Increase heparin to 950 units/hr Next lvl  0000  Levester Fresh, PharmD, BCPS, BCCCP Clinical Pharmacist [phone]  Please check AMION for all [place] numbers  03/21/2019 6:30 PM

## 2019-03-21 NOTE — Progress Notes (Signed)
EEG LTM Maintenance complete. Continue to monitor. No skin breakdown

## 2019-03-21 NOTE — Progress Notes (Signed)
RT NOTE: Per Dr.Van Trigt RT completed recruitment on patient. Patient tolerated well. After recruitment RT increased peep to 15 per Dr.Van Trigt. RT will continue to monitor.

## 2019-03-21 NOTE — Progress Notes (Signed)
ANTICOAGULATION CONSULT NOTE - Initial Consult  Pharmacy Consult for heparin Indication: r/o VTE  Allergies  Allergen Reactions  . Ivp Dye [Iodinated Diagnostic Agents]     Rash , hives  . Codeine Itching    Unless something given to counteract itching    Patient Measurements: Height: 5\' 8"  (172.7 cm) Weight: 243 lb 9.7 oz (110.5 kg) IBW/kg (Calculated) : 63.9 Heparin Dosing Weight: 80kg  Vital Signs: Temp: 100 F (37.8 C) (07/02 0700) Temp Source: Core (07/02 0400) BP: 116/32 (07/02 0401) Pulse Rate: 101 (07/02 0700)  Labs: Recent Labs    03/19/19 1815  03/20/19 0420  03/20/19 1630 03/20/19 1633 03/21/19 0415 03/21/19 0417  HGB 11.7*   < > 11.9*   < >  --  13.6 13.3 11.8*  HCT 34.9*   < > 35.1*   < >  --  40.0 39.0 34.1*  PLT 46*  --  46*  --   --   --   --  41*  CREATININE 3.91*  --  4.12*  --  4.12*  --   --  4.02*   < > = values in this interval not displayed.    Estimated Creatinine Clearance: 16.2 mL/min (A) (by C-G formula based on SCr of 4.02 mg/dL (H)).   Medical History: Past Medical History:  Diagnosis Date  . Anxiety   . Arthritis 09-14-11   osteoarthritis, osteopenia  . Cataracts, both eyes 09-13-11   not surgical yet  . Cholelithiasis 05/17/2016  . Chronic neck pain 05/29/2016  . Cystitis 2007   sepsis post bladder biopsy, Dr Jeffie Pollock  . Fibromyalgia 09-14-11   Dr Donney Dice, [facility]  . Fractures 09/14/2011   toes-left foot  . GERD (gastroesophageal reflux disease) 09-14-11   tx. Nexium  . H/O foot surgery 12/19/2011  . Hiatal hernia 05/17/2016  . Hyperglycemia 08/23/2016  . Hyperlipidemia   . Hypertension 09-13-11   tx. meds  . 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 Left ventricular hypertrophy 05/15/2017  . Muscle cramping 10/23/2017  . TMJ click 87/68/1157   right side > left  . 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 Patient has had a complicated stay. Now with possible VTE, hgb down slightly this morning to 11.8, plt are low but stable in 40s.   New orders to start heparin at 800 units/hr (will use admit wt of 80 kg as dosing wt, patient profusely volume overloaded currently).  Pharmacy asked to keep at lowest therapeutic goal. Will aim for heparin level of 0.3  Goal of Therapy:  Heparin level ~0.3 Monitor platelets by anticoagulation protocol: Yes   Plan:  Start heparin infusion at 800 units/hr Check anti-Xa level in 8 hours and daily while on heparin Continue to monitor H&H and platelets  Erin Hearing PharmD., BCPS Clinical Pharmacist 03/21/2019 8:46 AM

## 2019-03-21 NOTE — Progress Notes (Signed)
At approximately 0740 pt O2 sat dropped from low 90s into the upper 80s. RN suctioned and lavaged pt without improvement. The pt was repositioned and RT was notified. Sats continued to drop and pt became increasingly hypotensive. An ABG was obtained and results reported to PA who instructed RN to continue monitoring pt for the time being.    PA returned to bedside with MD who instructed RN to decrease versed from 60 to 30 and increase levophed from 10 to 15 and inform CCM of ABG results.    Dr. Tamala Julian was made aware of ABG. A CXR was obtained. Recruitment maneuver was completed and PEEP increased from 10-15. 80 mg of lasix and 1 amp of bicarb given.   Sats and BP slowly improved. The family was called and notified of event and asked to come in for goals of care meeting.

## 2019-03-21 NOTE — Progress Notes (Signed)
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 S/p emergent repair with cardio thoracic surgery on 6/26. Post-op course c/b multiorgan dysfunction and new seizure activity and EEG consistent with severe encephalopathy. Pt unresponsive on Versed, propofol, Keppra, and valproic acid.  History of present illness   [name] with a  history of HTN, HLD with intermittent epigastric chest pain for the past few weeks. Multiple ED visits with normal cardiac enzymes and Echo showing LVH with normal valves and no pericardial effusions. 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 She underwent emergent surgical replacement of ascending aorta using a Hemashield vascular graft. 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  Post-operative course was initially complicated by multiorgan dysfunction including atrial fibrillation with RVR, acute renal failure and acute respiratory failure, requiring mechanical ventilation and sedation. On 6/28, while on Precedex, patient was noted to have facial twitching concerning for seizure activity that was unresponsive to [place]. EEG was ordered and showed electrographic status epilepticus, which persisted after giving Ativan. Neurology was consulted and are managing patient on Versed and propofol, with scheduled Keppra, and valproic acid.  Family history significant for Aortic dissection in father (deceased). No history of seizures in pt or family.  Past Medical History   Past Medical History:  Diagnosis Date  . Anxiety   . Arthritis 09-14-11   osteoarthritis,  osteopenia  . Cataracts, both eyes 09-13-11   not surgical yet  . Cholelithiasis 05/17/2016  . Chronic neck pain 05/29/2016  . Cystitis 2007   sepsis post bladder biopsy, Dr Jeffie Pollock  . Fibromyalgia 09-14-11   Dr Donney Dice, [facility]  . Fractures 09/14/2011   toes-left foot  . GERD (gastroesophageal reflux disease) 09-14-11   tx. Nexium  . H/O foot surgery 12/19/2011  . Hiatal hernia 05/17/2016  . Hyperglycemia 08/23/2016  . Hyperlipidemia   . Hypertension 09-13-11   tx. meds  . 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 Left ventricular hypertrophy 05/15/2017  . Muscle cramping 10/23/2017  . TMJ click 29/79/8921   right side > left  . Varicose vein 09-14-11   bilateral , with tenderness left shin bone near foot    Significant Hospital Events   6/26 - ED-Hospital Admission 6/26 - Emergency surgical aortic dissection repair 7/01 - Transfer to CVICU  Consults:  Cardiothoracic Surgery Neurology  Procedures:  6/26 - Replacement of ascending aorta w/ Hemashield vascular graft  Significant Diagnostic Tests:  6/13 - ECHO: LVH with hyperdynamic systolic function. EF >65%. Mild aortic regurgitation. No pericardial effusion.  6/26 - CTA Chest/Abd/ Pelv: Penetrating ulver involving anterior ascending aorta with acute intraluminal hematoma. Small volume hemopericardium. Trace L-sided pleural effusion.  6/30 CT Head: Cortical and subcortical hypodensity in R hemisphere, watershed distribution. Periventricular and deep white matter hypodensity. Findings concerning for hypoperfusion ischemia involving R hemisphere.   6/30 LTM EEG: Abnormal and consistent with severe encephalopathy of nonspecific etiologies. Nonconvulsive status epilepticus largely resolved however background activities continue to demonstrate frequent broad spike and wave discharges maximal in the left frontocentral cortex  Micro Data:  6/26 - SARD CoV-2: Negative 6/28 - Respiratory  Culture: NGTD 6/29 - Nasal swab: Staph aureus Positive  Antimicrobials:  Cephtriaxone (6/28- )  Interim history/subjective:  Pt hypoxic to 87-89 on 100% FiO2 overnight, PEEP and RR increased. Remained hypoxic to upper 70s-80s and hypotensive this morning. CXR unchanged from prior. Versed decreased, levophed and PEEP increased, and lasix and bicard given with improvement of sats and BP. Patient sedated and unresponsive. Family now at bedside discussing [place].  Objective   Blood pressure (!) 116/32, pulse (!) 101, temperature 100 F (37.8 C), resp. rate (!) 22, height 5\' 8"  (1.727 m), weight 110.5 kg, SpO2 (!) 89 %. PAP: (18-25)/(13-20) 25/20 CVP:  [10 mmHg-19 mmHg] 16 mmHg CO:  [5.6 L/min-5.8 L/min] 5.8 L/min CI:  [2.9 L/min/m2-3 L/min/m2] 3 L/min/m2  Vent Mode: PRVC FiO2 (%):  [100 %] 100 % Set Rate:  [18 bmp-22 bmp] 22 bmp Vt Set:  [510 mL] 510 mL PEEP:  [8 cmH20-15 cmH20] 15 cmH20 Plateau Pressure:  [20 cmH20-32 cmH20] 32 cmH20   Intake/Output Summary (Last 24 hours) at 03/21/2019 1040 Last data filed at 03/21/2019 0900 Gross per 24 hour  Intake 5468.66 ml  Output 2252 ml  Net 3216.66 ml   Filed Weights   03/19/19 0500 03/20/19 0500 03/21/19 0500  Weight: 105 kg 108.4 kg 110.5 kg    Examination: General: Sedated on venilator HENT: MMM Lungs: Coarse breath sounds anteriorly Cardiovascular: Irregular rhythm, normal rate. No m/r/g. Abdomen: Soft, non-distended. Hypoactive bowel sounds.  Extremities: 2+ BUE edema. Neuro: Unresponsive to verbal or noxious stimuli. GU: Foley in place draining scant amber-colored urine  Resolved Hospital Problem list   N/A  Assessment & Plan:  Neuro: Status Epilepticus - 7/1 LTM EEG showing resolution of seizures. Burst suppression pattern was achieved. Left anterior frontal sharp waves and spikes still present and suggestive of cortical irritability in that region. - Neuro following, recs appreciated - Versed stopped per Neuro - Propofol  dec to 10 mcg/kg/min - Continue scheduled Keppra 100mg  and valproic acid 500mg  - LTM EEG  Acute Encephalopathy: 2/2 status epilepticus and hypoperfusion ischemia - 6/30 CT Head with watershed distribution c/f hypoperfusion ischemia - Seizure management as above  Cardiovascular: Aortic dissection s/p repair 6/26 - Cardiothoracic surgery following, recs appreciated - Metoprolol PRN  Shock: Likely combined cardiogenic and distributive - Epinephrine at 3 mcg/min - Milrinone dec to at 0.125 mcg/kg/min - Norepinephrine drip - Pressors currently at max for CV safety  Atrial Fibrilation w/ RVR, post-operative - rates to 100s-110s intermittently - continue amiodarone 60mg   Respiratory: Acute Hypoxic Respiratory Failure - 7/2 CXR with stable bilateral infiltrates - ABG: pH 7.257, pCO2 46.6, pO2 61 - Chest tube w/ 322 out over 24hrs - Persistently poor oxygenation despite FiO2 at 100%, PEEP at 15 - s/p IV Lasix 80 x1 and bicarb 40mEq - IV Lasix 15mg /hr and PRN metolazone 10mg   Renal: Acute Renal Failure - Worsening renal function, Cr 4.12<3.91<3.69 - On Lasix and s/p metolazone as above - Poor UOP relative to fluid intake and diuretics - May benefit from Nephrology consult and CRRT consideration  Heme: Thrombocytopenia - s/p platelet transfusion 6/30 - Plt stable at 46 s/p transfusion - CBC Daily  Infectious Disease:  Staph aureus colonization - Bactroban BID  Leukocytosis - Some concern for pneumonia given b/l infiltrates - WBC 10.7< 13.2 - Respiratory Cx NGTD - Ceftriaxone (6/28- ), Vancomycin (7/1- ) - f/u repeat respiratory cx and differential  GI: Transaminitis likely 2/2 shock and venous congestion, trending upwards. -  AST 222, ALT 105, AlkPhos  93, TBili 1.9 - CTM  Endocrine: Hypoglycemia - BG 100>105>104 - Continue D10W - CTM  Best practice:  Diet: Tube feeds Pain/Anxiety/Delirium protocol (if indicated): N/A VAP protocol (if indicated):   Per protocol DVT prophylaxis: SCDs GI prophylaxis: Protonix Glucose control: SSI Mobility: Bedrest (sedated) Code Status: Full Family Communication: Family at bedside Disposition: ICU  Labs   CBC: Recent Labs  Lab 03/14/2019 1200  03/18/19 0419  03/19/19 0354  03/19/19 1815  03/20/19 0420  03/20/19 0801 03/20/19 1633 03/21/19 0415 03/21/19 0417 03/21/19 0745  WBC 11.0*   < > 16.6*  --  15.4*  --  13.2*  --  10.7*  --   --   --   --  10.4  --   NEUTROABS 8.4*  --   --   --   --   --   --   --   --   --   --   --   --   --   --   HGB 11.8*   < > 12.7   < > 12.8   < > 11.7*   < > 11.9*   < > 12.6 13.6 13.3 11.8* 12.6  HCT 36.7   < > 36.2   < > 36.9   < > 34.9*   < > 35.1*   < > 37.0 40.0 39.0 34.1* 37.0  MCV 89.5   < > 83.2  --  83.9  --  87.0  --  86.5  --   --   --   --  85.5  --   PLT 414*   < > 28*  --  17*  --  46*  --  46*  --   --   --   --  41*  --    < > = values in this interval not displayed.    Basic Metabolic Panel: Recent Labs  Lab 03/16/19 1105  03/16/19 1718  03/17/19 0200  03/17/19 1852  03/19/19 0354  03/19/19 1815  03/20/19 0420  03/20/19 1630 03/20/19 1633 03/21/19 0415 03/21/19 0417 03/21/19 0745  NA 147*   < > 151*   < > 145   < > 146*   < > 142   < > 142   < > 142   < > 141 140 141 140 140  K 4.4   < > 3.5   < > 3.9   < > 3.9   < > 3.9   < > 4.0   < > 4.1   < > 3.6 3.5 3.3* 3.4* 3.4*  CL 114*  --  113*   < > 113*  --  109   < > 107  --  106  --  105  --  102  --   --  103  --   CO2 20*  --  23  --  23  --  23   < > 21*  --  22  --  22  --  21*  --   --  20*  --   GLUCOSE 192*   < > 136*   < > 123*   < > 211*   < > 128*  --  121*  --  107*  --  118*  --   --  119*  --   BUN 10  --  13   < > 18  --  26*   < > 42*  --  45*  --  50*  --  54*  --   --  55*  --   CREATININE 0.95  --  1.43*   < > 1.88*  --  2.58*   < > 3.69*  --  3.91*  --  4.12*  --  4.12*  --   --  4.02*  --   CALCIUM 10.5*  --  10.3  --  9.6  --  8.8*   < > 7.6*  --  7.5*  --  7.5*   --  7.3*  --   --  7.1*  --   MG 1.8  --  3.4*  --  2.7*  --  2.6*  --   --   --   --   --   --   --   --   --   --   --   --    < > = values in this interval not displayed.   GFR: Estimated Creatinine Clearance: 16.2 mL/min (A) (by C-G formula based on SCr of 4.02 mg/dL (H)). Recent Labs  Lab 03/19/19 0354 03/19/19 1815 03/20/19 0420 03/20/19 1731 03/21/19 0417  PROCALCITON  --   --   --  2.90  --   WBC 15.4* 13.2* 10.7*  --  10.4    Liver Function Tests: Recent Labs  Lab 03/17/19 0200 03/18/19 0419 03/19/19 0354 03/20/19 0420 03/21/19 0417  AST 116* 104* 183* 191* 222*  ALT 53* 51* 92* 101* 105*  ALKPHOS 30* 40 51 69 93  BILITOT 5.2* 3.6* 2.0* 1.3* 1.9*  PROT 3.9* 3.6* 3.7* 3.9* 3.8*  ALBUMIN 2.9* 2.5* 2.4* 2.2* 1.9*   Recent Labs  Lab 03/13/2019 1200  LIPASE 24   No results for input(s): AMMONIA in the last 168 hours.  ABG    Component Value Date/Time   PHART 7.257 (L) 03/21/2019 0745   PCO2ART 46.6 03/21/2019 0745   PO2ART 61.0 (L) 03/21/2019 0745   HCO3 20.6 03/21/2019 0745   TCO2 22 03/21/2019 0745   ACIDBASEDEF 6.0 (H) 03/21/2019 0745   O2SAT 85.0 03/21/2019 0745     Coagulation Profile: Recent Labs  Lab 03/16/19 1321  INR 1.4*    Cardiac Enzymes: No results for input(s): CKTOTAL, CKMB, CKMBINDEX, TROPONINI in the last 168 hours.  HbA1C: Hgb A1c MFr Bld  Date/Time Value Ref Range Status  02/22/2018 10:06 AM 5.7 4.6 - 6.5 % Final    Comment:    Glycemic Control Guidelines for People with Diabetes:Non Diabetic:  <6%Goal of Therapy: <7%Additional Action Suggested:  >8%   10/23/2017 10:58 AM 5.9 4.6 - 6.5 % Final    Comment:    Glycemic Control Guidelines for People with Diabetes:Non Diabetic:  <6%Goal of Therapy: <7%Additional Action Suggested:  >8%     CBG: Recent Labs  Lab 03/20/19 1631 03/20/19 1944 03/20/19 2316 03/21/19 0419 03/21/19 0751  GLUCAP 114* 106* 104* 105* 100*    Review of Systems:   Unable to assess as pt  unresponsive.   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   Surgical History    Past Surgical History:  Procedure Laterality Date  .  ABDOMINAL HYSTERECTOMY  1977   dysfunctional menses; age 15  . APPENDECTOMY  1963  . COLONOSCOPY  2011   negative  . CYSTOSTOMY W/ BLADDER BIOPSY  04-2006  . FOOT ARTHRODESIS  01/03/12   [facility]-- left hallux MP joint arthrodesis  . HAMMER TOE SURGERY  01/03/12   [facility], Dr Doran Durand  . NASAL SINUS SURGERY  09-14-11   right side " sinus lift"  . REPLACEMENT ASCENDING AORTA N/A 03/09/2019   Procedure: REPLACEMENT OF ASCENDING AORTA USING HEMASHIELD PLATINUM 28MM and 30MM VASCULAR GRAFT;  Surgeon: Ivin Poot, MD;  Location: [facility];  Service: Open Heart Surgery;  Laterality: N/A;  . SIGMOIDOSCOPY    . sling procedure  2006   a and p repair  . TONSILLECTOMY    . TOTAL KNEE ARTHROPLASTY  09/21/2011/[facility]   Procedure: TOTAL KNEE ARTHROPLASTY;  Surgeon: Gearlean Alf;  Location: [facility];  Service: Orthopedics;  Laterality: Left;  . TOTAL KNEE ARTHROPLASTY  06/25/2012   Procedure: TOTAL KNEE ARTHROPLASTY;  Surgeon: Gearlean Alf, MD;  Location: [facility];  Service: Orthopedics;  Laterality: Right;     Social History   reports that she has never smoked. She has never used smokeless tobacco. She reports previous alcohol use. She reports that she does not use drugs.   Family History   Her family history  includes AAA (abdominal aortic aneurysm) in her son; Aortic aneurysm in her father; Aortic dissection in her father; Colon cancer in her paternal aunt; Heart attack in her maternal grandfather, paternal grandfather, paternal grandmother, and another family member; Heart disease in her paternal grandfather; Hypertension in her daughter, maternal grandfather, maternal grandmother, mother, paternal grandfather, paternal grandmother, and son; Mental illness in her brother; Other in her maternal grandmother and mother; Pneumonia in her brother; Stroke in her paternal aunt; Supraventricular tachycardia in her daughter. There is no history of Diabetes.   Allergies Allergies  Allergen Reactions  . Ivp Dye [Iodinated Diagnostic Agents]     Rash , hives  . Codeine Itching    Unless something given to counteract itching     Home Medications  Prior to Admission medications   Medication Sig Start Date End Date Taking? Authorizing Provider  aspirin EC 81 MG tablet Take 81 mg by mouth at bedtime.    Yes [provider]  Cholecalciferol (VITAMIN D3) 2000 UNITS TABS Take 2,000 Units by mouth 2 (two) times a day.    Yes [provider]  cyclobenzaprine (FLEXERIL) 10 MG tablet Take 10 mg by mouth 3 (three) times daily as needed for muscle spasms.   Yes [provider]  DULoxetine (CYMBALTA) 60 MG capsule Take 60 mg by mouth daily.   Yes [provider]  furosemide (LASIX) 20 MG tablet Take 20 mg by mouth as needed.   Yes [provider]  Multiple Vitamin (MULTI-DAY PO) Take 1 tablet by mouth daily.    Yes [provider]  pantoprazole (PROTONIX) 40 MG tablet Take 1 tablet (40 mg total) by mouth daily. 03/02/19 03/01/20 Yes Guilford Shi, MD  pravastatin (PRAVACHOL) 40 MG tablet Take 1 tablet (40 mg total) by mouth at bedtime. 08/28/18  Yes Mosie Lukes, MD  pyridOXINE (VITAMIN B-6) 100 MG tablet Take 100 mg by mouth daily.   Yes [provider]   tizanidine (ZANAFLEX) 2 MG capsule Take 4 mg by mouth at bedtime.   Yes [provider]  traMADol (ULTRAM) 50 MG tablet Take 2 tablets by  mouth every 6 (six) hours as needed. Not to exceed 6 tablets a day 06/04/15  Yes [provider]  metoprolol tartrate (LOPRESSOR) 25 MG tablet Take 0.5 tablets (12.5 mg total) by mouth 2 (two) times daily. Hold SBP<100, HR <60 03/07/19 03/06/20  Minus Breeding, MD  predniSONE (DELTASONE) 50 MG tablet Take 1 tablet 13 hours, 7 hours, and 1 hour prior to CT test 03/13/19   Hilty, Nadean Corwin, MD     Critical care time: 40 minutes   -- Armida Sans, MS4

## 2019-03-21 NOTE — Progress Notes (Signed)
RT NOTE: RN called RT to bedside due to patient desat. RT arrived and patient sat between 82%-84% with good wave form. RT completed another recruitment and patient sat increased to 92%. RT will continue to monitor.

## 2019-03-21 NOTE — Progress Notes (Signed)
Pt SpO2 dropped to 87-89 sustained around 0400; pt had previously been 92-93 on FiO2 100%, PEEP 8, RR 18, and tidal volume 510. Obtained morning ABG, see results in chart. Paged Dr. Orvan Seen and updated, orders received to increase PEEP to 10 and RR to 22. RT notified. Will continue to closely monitor pt respiratory status.  Sherlie Ban, RN

## 2019-03-21 NOTE — Progress Notes (Signed)
6 Days Post-Op Procedure(s) (LRB): REPLACEMENT OF ASCENDING AORTA USING HEMASHIELD PLATINUM 28MM and 30MM VASCULAR GRAFT (N/A) Subjective: Worsening pulmonary, renal status Patient wouldnot tolerate CVVH, not able to travel for repeat head CT Poor neuro prognosis d/w family - now DNR but cont support Tube feeds started trickle Objective: Vital signs in last 24 hours: Temp:  [96.4 F (35.8 C)-100.2 F (37.9 C)] 99.3 F (37.4 C) (07/02 1815) Pulse Rate:  [92-108] 103 (07/02 1815) Cardiac Rhythm: Sinus tachycardia (07/02 1600) Resp:  [0-29] 22 (07/02 1815) BP: (56-116)/(31-55) 81/44 (07/02 1600) SpO2:  [78 %-96 %] 90 % (07/02 1815) Arterial Line BP: (84-139)/(20-64) 122/36 (07/02 1815) FiO2 (%):  [100 %] 100 % (07/02 1557) Weight:  [110.5 kg] 110.5 kg (07/02 0500)  Hemodynamic parameters for last 24 hours: PAP: (20-25)/(14-20) 22/17 CVP:  [10 mmHg-16 mmHg] 15 mmHg CO:  [5.6 L/min-6 L/min] 6 L/min CI:  [2.9 L/min/m2-3.1 L/min/m2] 3.1 L/min/m2  Intake/Output from previous day: 07/01 0701 - 07/02 0700 In: 5920.9 [I.V.:4344.2; NG/GT:500; IV Piggyback:1076.7] Out: 2552 [Urine:2205; Emesis/NG output:25; Chest Tube:322] Intake/Output this shift: No intake/output data recorded.       Exam    General- sedated unresponsive    Neck- no JVD,   + carotid pulses bilat   Lungs- coarse breath sounds   Cor- regular rate and rhythm, no murmur , gallop   Abdomen- soft, non-tender   Extremities - edematous- feet warm   Neuro- no seizure activity, unresponsive   Lab Results: Recent Labs    03/20/19 0420  03/21/19 0417  03/21/19 1124 03/21/19 1742  WBC 10.7*  --  10.4  --   --   --   HGB 11.9*   < > 11.8*   < > 12.2 12.6  HCT 35.1*   < > 34.1*   < > 36.0 37.0  PLT 46*  --  41*  --   --   --    < > = values in this interval not displayed.   BMET:  Recent Labs    03/21/19 0417  03/21/19 1545 03/21/19 1742  NA 140   < > 140 138  K 3.4*   < > 3.2* 3.1*  CL 103  --  102  --    CO2 20*  --  20*  --   GLUCOSE 119*  --  102*  --   BUN 55*  --  60*  --   CREATININE 4.02*  --  4.17*  --   CALCIUM 7.1*  --  7.1*  --    < > = values in this interval not displayed.    PT/INR: No results for input(s): LABPROT, INR in the last 72 hours. ABG    Component Value Date/Time   PHART 7.270 (L) 03/21/2019 1742   HCO3 21.6 03/21/2019 1742   TCO2 23 03/21/2019 1742   ACIDBASEDEF 5.0 (H) 03/21/2019 1742   O2SAT 90.0 03/21/2019 1742   CBG (last 3)  Recent Labs    03/21/19 0419 03/21/19 0751 03/21/19 1130  GLUCAP 105* 100* 89    Assessment/Plan: S/P Procedure(s) (LRB): REPLACEMENT OF ASCENDING AORTA USING HEMASHIELD PLATINUM 28MM and 30MM VASCULAR GRAFT (N/A) Cont supportive care but prognosis poor- family aware   LOS: 6 days    Madeline [name] 03/21/2019

## 2019-03-22 ENCOUNTER — Inpatient Hospital Stay (HOSPITAL_COMMUNITY): Payer: Medicare Other

## 2019-03-22 DIAGNOSIS — G9340 Encephalopathy, unspecified: Secondary | ICD-10-CM

## 2019-03-22 LAB — COOXEMETRY PANEL
Carboxyhemoglobin: 0.7 % (ref 0.5–1.5)
Carboxyhemoglobin: 0.9 % (ref 0.5–1.5)
Methemoglobin: 0.9 % (ref 0.0–1.5)
Methemoglobin: 1 % (ref 0.0–1.5)
O2 Saturation: 73.8 %
O2 Saturation: 81.9 %
Total hemoglobin: 10.2 g/dL — ABNORMAL LOW (ref 12.0–16.0)
Total hemoglobin: 9.6 g/dL — ABNORMAL LOW (ref 12.0–16.0)

## 2019-03-22 LAB — HEPARIN LEVEL (UNFRACTIONATED)
Heparin Unfractionated: 0.11 IU/mL — ABNORMAL LOW (ref 0.30–0.70)
Heparin Unfractionated: 0.12 IU/mL — ABNORMAL LOW (ref 0.30–0.70)
Heparin Unfractionated: 0.18 IU/mL — ABNORMAL LOW (ref 0.30–0.70)

## 2019-03-22 LAB — POCT I-STAT 7, (LYTES, BLD GAS, ICA,H+H)
Acid-base deficit: 6 mmol/L — ABNORMAL HIGH (ref 0.0–2.0)
Acid-base deficit: 5 mmol/L — ABNORMAL HIGH (ref 0.0–2.0)
Bicarbonate: 21.3 mmol/L (ref 20.0–28.0)
Bicarbonate: 22 mmol/L (ref 20.0–28.0)
Calcium, Ion: 0.99 mmol/L — ABNORMAL LOW (ref 1.15–1.40)
Calcium, Ion: 1 mmol/L — ABNORMAL LOW (ref 1.15–1.40)
HCT: 37 % (ref 36.0–46.0)
HCT: 35 % — ABNORMAL LOW (ref 36.0–46.0)
Hemoglobin: 12.6 g/dL (ref 12.0–15.0)
Hemoglobin: 11.9 g/dL — ABNORMAL LOW (ref 12.0–15.0)
O2 Saturation: 89 %
O2 Saturation: 93 %
Patient temperature: 37.2
Patient temperature: 36.8
Potassium: 3.1 mmol/L — ABNORMAL LOW (ref 3.5–5.1)
Potassium: 3.3 mmol/L — ABNORMAL LOW (ref 3.5–5.1)
Sodium: 138 mmol/L (ref 135–145)
Sodium: 137 mmol/L (ref 135–145)
TCO2: 23 mmol/L (ref 22–32)
TCO2: 23 mmol/L (ref 22–32)
pCO2 arterial: 47.6 mmHg (ref 32.0–48.0)
pCO2 arterial: 46 mmHg (ref 32.0–48.0)
pH, Arterial: 7.26 — ABNORMAL LOW (ref 7.350–7.450)
pH, Arterial: 7.288 — ABNORMAL LOW (ref 7.350–7.450)
pO2, Arterial: 65 mmHg — ABNORMAL LOW (ref 83.0–108.0)
pO2, Arterial: 76 mmHg — ABNORMAL LOW (ref 83.0–108.0)

## 2019-03-22 LAB — CBC
HCT: 31.4 % — ABNORMAL LOW (ref 36.0–46.0)
Hemoglobin: 11.2 g/dL — ABNORMAL LOW (ref 12.0–15.0)
MCH: 29.1 pg (ref 26.0–34.0)
MCHC: 35.7 g/dL (ref 30.0–36.0)
MCV: 81.6 fL (ref 80.0–100.0)
Platelets: 40 10*3/uL — ABNORMAL LOW (ref 150–400)
RBC: 3.85 MIL/uL — ABNORMAL LOW (ref 3.87–5.11)
RDW: 16.8 % — ABNORMAL HIGH (ref 11.5–15.5)
WBC: 13.3 10*3/uL — ABNORMAL HIGH (ref 4.0–10.5)
nRBC: 3 % — ABNORMAL HIGH (ref 0.0–0.2)

## 2019-03-22 LAB — BASIC METABOLIC PANEL
Anion gap: 17 — ABNORMAL HIGH (ref 5–15)
BUN: 70 mg/dL — ABNORMAL HIGH (ref 8–23)
CO2: 21 mmol/L — ABNORMAL LOW (ref 22–32)
Calcium: 7 mg/dL — ABNORMAL LOW (ref 8.9–10.3)
Chloride: 101 mmol/L (ref 98–111)
Creatinine, Ser: 4.48 mg/dL — ABNORMAL HIGH (ref 0.44–1.00)
GFR calc Af Amer: 11 mL/min — ABNORMAL LOW (ref >60)
GFR calc non Af Amer: 9 mL/min — ABNORMAL LOW (ref >60)
Glucose, Bld: 105 mg/dL — ABNORMAL HIGH (ref 70–99)
Potassium: 3.5 mmol/L (ref 3.5–5.1)
Sodium: 139 mmol/L (ref 135–145)

## 2019-03-22 LAB — GLUCOSE, CAPILLARY
Glucose-Capillary: 106 mg/dL — ABNORMAL HIGH (ref 70–99)
Glucose-Capillary: 107 mg/dL — ABNORMAL HIGH (ref 70–99)
Glucose-Capillary: 109 mg/dL — ABNORMAL HIGH (ref 70–99)
Glucose-Capillary: 96 mg/dL (ref 70–99)
Glucose-Capillary: 98 mg/dL (ref 70–99)
Glucose-Capillary: 99 mg/dL (ref 70–99)

## 2019-03-22 LAB — COMPREHENSIVE METABOLIC PANEL
ALT: 131 U/L — ABNORMAL HIGH (ref 0–44)
AST: 259 U/L — ABNORMAL HIGH (ref 15–41)
Albumin: 1.7 g/dL — ABNORMAL LOW (ref 3.5–5.0)
Alkaline Phosphatase: 191 U/L — ABNORMAL HIGH (ref 38–126)
Anion gap: 20 — ABNORMAL HIGH (ref 5–15)
BUN: 66 mg/dL — ABNORMAL HIGH (ref 8–23)
CO2: 19 mmol/L — ABNORMAL LOW (ref 22–32)
Calcium: 7 mg/dL — ABNORMAL LOW (ref 8.9–10.3)
Chloride: 99 mmol/L (ref 98–111)
Creatinine, Ser: 4.53 mg/dL — ABNORMAL HIGH (ref 0.44–1.00)
GFR calc Af Amer: 10 mL/min — ABNORMAL LOW (ref >60)
GFR calc non Af Amer: 9 mL/min — ABNORMAL LOW (ref >60)
Glucose, Bld: 115 mg/dL — ABNORMAL HIGH (ref 70–99)
Potassium: 3.2 mmol/L — ABNORMAL LOW (ref 3.5–5.1)
Sodium: 138 mmol/L (ref 135–145)
Total Bilirubin: 2.2 mg/dL — ABNORMAL HIGH (ref 0.3–1.2)
Total Protein: 3.8 g/dL — ABNORMAL LOW (ref 6.5–8.1)

## 2019-03-22 LAB — APTT
aPTT: 65 seconds — ABNORMAL HIGH (ref 24–36)
aPTT: 98 seconds — ABNORMAL HIGH (ref 24–36)
aPTT: 79 seconds — ABNORMAL HIGH (ref 24–36)

## 2019-03-22 LAB — VANCOMYCIN, TROUGH: Vancomycin Tr: 21 ug/mL (ref 15–20)

## 2019-03-22 MED ORDER — ALBUMIN HUMAN 25 % IV SOLN
12.5000 g | Freq: Four times a day (QID) | INTRAVENOUS | Status: AC
  Administered 2019-03-22 (×2): 12.5 g via INTRAVENOUS
  Filled 2019-03-22 (×2): qty 50

## 2019-03-22 MED ORDER — SODIUM BICARBONATE 8.4 % IV SOLN
50.0000 meq | Freq: Once | INTRAVENOUS | Status: AC
  Administered 2019-03-22: 50 meq via INTRAVENOUS
  Filled 2019-03-22: qty 50

## 2019-03-22 MED ORDER — VITAL 1.5 CAL PO LIQD
1000.0000 mL | ORAL | Status: DC
  Administered 2019-03-22: 14:00:00 1000 mL
  Filled 2019-03-22: qty 1000

## 2019-03-22 MED ORDER — PRO-STAT SUGAR FREE PO LIQD
30.0000 mL | Freq: Three times a day (TID) | ORAL | Status: DC
  Administered 2019-03-22 (×2): 30 mL
  Filled 2019-03-22 (×2): qty 30

## 2019-03-22 MED ORDER — POTASSIUM CHLORIDE 10 MEQ/50ML IV SOLN
10.0000 meq | INTRAVENOUS | Status: AC
  Administered 2019-03-22 (×2): 10 meq via INTRAVENOUS
  Filled 2019-03-22: qty 50

## 2019-03-22 NOTE — Progress Notes (Signed)
Neurology Progress Note  Subjective: Madeline [name] is an 73 y.o. female with HTN and a family history of aortic dissection who presented to [facility] with persistent epigastric penetrating chest pain for the past few weeks. 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 She underwent emergent cardiothoracic surgical procedure with replacement of ascending aorta using a Hemashield vascular graft. 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 On follow up exam 6/28 while patient was sedated on Precedex, facial twitching was noted. The patient was started on Keppra 1000 mg BID at 10 PM on Sunday night. Twitching continued and EEG was ordered for the AM, which showed electrographic status epilepticus. Neurology was called to the bedside STAT to evaluate. CT headperformed on 6/30 revealedcortical and subcortical hypodensity, most conspicuous in the right hemisphere and generallyin a watershed distribution, likely etiology of new onset of seizures.  Patient was started on keppra and valproate for seizure suppression and has not had any more epileptiform discharges on EEG. She has been off her propofol and versed for approximately 24 hours but has not been arousable as of yet. Versed can take several days to wash out, hopefully will see improvement by in the next few days. On assessment, patient continues to be in a comatose like state with no arousal/withdrawals to voice or noxious stimuli.  Objective: Current vital signs: BP (!) 72/51 (BP Location: Right Arm)   Pulse 99   Temp 99 F (37.2 C)   Resp (!) 22   Ht _0  (1.727 m)   Wt 117.2 kg   SpO2 93%   BMI 39.29 kg/m  Vital signs in last 24 hours: Temp:  [99 F (37.2 C)-100 F (37.8 C)] 99 F (37.2 C) (07/03 0700) Pulse Rate:  [99-108] 99 (07/03 0700) Resp:  [0-29] 22 (07/03 0700) BP: (56-118)/(31-55) 72/51 (07/03 0400) SpO2:  [78  %-96 %] 93 % (07/03 0758) Arterial Line BP: (84-139)/(20-64) 116/40 (07/03 0700) FiO2 (%):  [80 %-100 %] 100 % (07/03 0758) Weight:  [117.2 kg] 117.2 kg (07/03 0400)  Intake/Output from previous day: 07/02 0701 - 07/03 0700 In: 3897.6 [I.V.:2981.2; NG/GT:500; IV Piggyback:416.5] Out: 1035 [Urine:775; Chest Tube:260]  Neurologic Exam: Not awake, not arousable to voice or noxious stimuli. Pupils 10m equal and reactive to light. No blink to threat or dolls. No cough or gag reflex. Does not withdraw to noxious stimuli on RHB or LHB.  Lab Results: Results for orders placed or performed during the hospital encounter of 02/28/2019 (from the past 48 hour(s))  I-STAT 7, (LYTES, BLD GAS, ICA, H+H)     Status: Abnormal   Collection Time: 03/20/19  8:01 AM  Result Value Ref Range   pH, Arterial 7.289 (L) 7.350 - 7.450   pCO2 arterial 47.1 32.0 - 48.0 mmHg   pO2, Arterial 55.0 (L) 83.0 - 108.0 mmHg   Bicarbonate 22.6 20.0 - 28.0 mmol/L   TCO2 24 22 - 32 mmol/L   O2 Saturation 84.0 %   Acid-base deficit 4.0 (H) 0.0 - 2.0 mmol/L   Sodium 140 135 - 145 mmol/L   Potassium 3.8 3.5 - 5.1 mmol/L   Calcium, Ion 1.06 (L) 1.15 - 1.40 mmol/L   HCT 37.0 36.0 - 46.0 %   Hemoglobin 12.6 12.0 - 15.0 g/dL   Patient temperature HIDE    Sample type ARTERIAL   Glucose, capillary     Status: Abnormal   Collection Time: 03/20/19 11:06 AM  Result Value Ref Range   Glucose-Capillary 100 (H) 70 - 99 mg/dL  Pathologist smear review     Status: None   Collection Time: 03/20/19  1:46 PM  Result Value Ref Range   Path Review Reviewed By Violet Baldy, M.D.     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 WORRISOME FOR HEMOLYTIC PROCESS.  THROMBOCYTOPENIA, AND LEUKOCYTOSIS. Performed at [facility], [place] [address], [place], [place] [address]   Basic metabolic panel     Status: Abnormal   Collection Time: 03/20/19  4:30 PM  Result Value Ref Range   Sodium 141 135 - 145 mmol/L    Potassium 3.6 3.5 - 5.1 mmol/L   Chloride 102 98 - 111 mmol/L   CO2 21 (L) 22 - 32 mmol/L   Glucose, Bld 118 (H) 70 - 99 mg/dL   BUN 54 (H) 8 - 23 mg/dL   Creatinine, Ser 4.12 (H) 0.44 - 1.00 mg/dL   Calcium 7.3 (L) 8.9 - 10.3 mg/dL   GFR calc non Af Amer 10 (L) >60 mL/min   GFR calc Af Amer 12 (L) >60 mL/min   Anion gap 18 (H) 5 - 15    Comment: Performed at [facility] [address], [place], [place] [address]  Valproic acid level     Status: None   Collection Time: 03/20/19  4:30 PM  Result Value Ref Range   Valproic Acid Lvl 66 50.0 - 100.0 ug/mL    Comment: Performed at [facility] [address] [place], [place] [address]  Glucose, capillary     Status: Abnormal   Collection Time: 03/20/19  4:31 PM  Result Value Ref Range   Glucose-Capillary 114 (H) 70 - 99 mg/dL  I-STAT 7, (LYTES, BLD GAS, ICA, H+H)     Status: Abnormal   Collection Time: 03/20/19  4:33 PM  Result Value Ref Range   pH, Arterial 7.252 (L) 7.350 - 7.450   pCO2 arterial 51.9 (H) 32.0 - 48.0 mmHg   pO2, Arterial 61.0 (L) 83.0 - 108.0 mmHg   Bicarbonate 22.8 20.0 - 28.0 mmol/L   TCO2 24 22 - 32 mmol/L   O2 Saturation 86.0 %   Acid-base deficit 5.0 (H) 0.0 - 2.0 mmol/L   Sodium 140 135 - 145 mmol/L   Potassium 3.5 3.5 - 5.1 mmol/L   Calcium, Ion 1.01 (L) 1.15 - 1.40 mmol/L   HCT 40.0 36.0 - 46.0 %   Hemoglobin 13.6 12.0 - 15.0 g/dL   Patient temperature HIDE    Sample type ARTERIAL   .Cooxemetry Panel (carboxy, met, total hgb, O2 sat)     Status: Abnormal   Collection Time: 03/20/19  4:50 PM  Result Value Ref Range   Total hemoglobin 6.9 (LL) 12.0 - 16.0 g/dL    Comment: CRITICAL RESULT CALLED TO, READ BACK BY AND VERIFIED WITH: JAIMEE LONG, RN [place], RRT ON 03/20/2019.    O2 Saturation 78.5 %   Carboxyhemoglobin 1.2 0.5 - 1.5 %   Methemoglobin 0.9 0.0 - 1.5 %  .Cooxemetry Panel (carboxy, met, total hgb, O2 sat)     Status: Abnormal   Collection Time: 03/20/19   5:28 PM  Result Value Ref Range   Total hemoglobin 11.2 (L) 12.0 - 16.0 g/dL   O2 Saturation 79.8 %   Carboxyhemoglobin 0.6 0.5 - 1.5 %   Methemoglobin 1.0 0.0 - 1.5 %  Procalcitonin - Baseline     Status: None  Collection Time: 03/20/19  5:31 PM  Result Value Ref Range   Procalcitonin 2.90 ng/mL    Comment:        Interpretation: PCT > 2 ng/mL: Systemic infection (sepsis) is likely, unless other causes are known. (NOTE)       Sepsis PCT Algorithm           Lower Respiratory Tract                                      Infection PCT Algorithm    ----------------------------     ----------------------------         PCT < 0.25 ng/mL                PCT < 0.10 ng/mL         Strongly encourage             Strongly discourage   discontinuation of antibiotics    initiation of antibiotics    ----------------------------     -----------------------------       PCT 0.25 - 0.50 ng/mL            PCT 0.10 - 0.25 ng/mL               OR       >80% decrease in PCT            Discourage initiation of                                            antibiotics      Encourage discontinuation           of antibiotics    ----------------------------     -----------------------------         PCT >= 0.50 ng/mL              PCT 0.26 - 0.50 ng/mL               AND       <80% decrease in PCT              Encourage initiation of                                             antibiotics       Encourage continuation           of antibiotics    ----------------------------     -----------------------------        PCT >= 0.50 ng/mL                  PCT > 0.50 ng/mL               AND         increase in PCT                  Strongly encourage                                      initiation of antibiotics    Strongly encourage escalation  of antibiotics                                     -----------------------------                                           PCT <= 0.25 ng/mL                                                  OR                                        > 80% decrease in PCT                                     Discontinue / Do not initiate                                             antibiotics Performed at [facility], [place] [address], [place], [place] [address]   Glucose, capillary     Status: Abnormal   Collection Time: 03/20/19  7:44 PM  Result Value Ref Range   Glucose-Capillary 106 (H) 70 - 99 mg/dL  Glucose, capillary     Status: Abnormal   Collection Time: 03/20/19 11:16 PM  Result Value Ref Range   Glucose-Capillary 104 (H) 70 - 99 mg/dL  I-STAT 7, (LYTES, BLD GAS, ICA, H+H)     Status: Abnormal   Collection Time: 03/21/19  4:15 AM  Result Value Ref Range   pH, Arterial 7.225 (L) 7.350 - 7.450   pCO2 arterial 53.0 (H) 32.0 - 48.0 mmHg   pO2, Arterial 65.0 (L) 83.0 - 108.0 mmHg   Bicarbonate 21.8 20.0 - 28.0 mmol/L   TCO2 23 22 - 32 mmol/L   O2 Saturation 87.0 %   Acid-base deficit 6.0 (H) 0.0 - 2.0 mmol/L   Sodium 141 135 - 145 mmol/L   Potassium 3.3 (L) 3.5 - 5.1 mmol/L   Calcium, Ion 1.03 (L) 1.15 - 1.40 mmol/L   HCT 39.0 36.0 - 46.0 %   Hemoglobin 13.3 12.0 - 15.0 g/dL   Patient temperature 37.5 C    Collection site ARTERIAL LINE    Drawn by Nurse    Sample type ARTERIAL   Comprehensive metabolic panel     Status: Abnormal   Collection Time: 03/21/19  4:17 AM  Result Value Ref Range   Sodium 140 135 - 145 mmol/L   Potassium 3.4 (L) 3.5 - 5.1 mmol/L   Chloride 103 98 - 111 mmol/L   CO2 20 (L) 22 - 32 mmol/L   Glucose, Bld 119 (H) 70 - 99 mg/dL   BUN 55 (H) 8 - 23 mg/dL   Creatinine, Ser 4.02 (H) 0.44 - 1.00 mg/dL   Calcium 7.1 (L) 8.9 - 10.3 mg/dL   Total Protein 3.8 (L) 6.5 -  8.1 g/dL   Albumin 1.9 (L) 3.5 - 5.0 g/dL   AST 222 (H) 15 - 41 U/L   ALT 105 (H) 0 - 44 U/L   Alkaline Phosphatase 93 38 - 126 U/L   Total Bilirubin 1.9 (H) 0.3 - 1.2 mg/dL   GFR calc non Af Amer 10 (L) >60 mL/min   GFR calc Af Amer 12 (L) >60 mL/min   Anion  gap 17 (H) 5 - 15    Comment: Performed at [facility] [address], [place], [place] [address]  Valproic acid level     Status: None   Collection Time: 03/21/19  4:17 AM  Result Value Ref Range   Valproic Acid Lvl 58 50.0 - 100.0 ug/mL    Comment: Performed at [facility] [address] [place], [place] [address]  CBC     Status: Abnormal   Collection Time: 03/21/19  4:17 AM  Result Value Ref Range   WBC 10.4 4.0 - 10.5 K/uL   RBC 3.99 3.87 - 5.11 MIL/uL   Hemoglobin 11.8 (L) 12.0 - 15.0 g/dL   HCT 34.1 (L) 36.0 - 46.0 %   MCV 85.5 80.0 - 100.0 fL   MCH 29.6 26.0 - 34.0 pg   MCHC 34.6 30.0 - 36.0 g/dL   RDW 17.2 (H) 11.5 - 15.5 %   Platelets 41 (L) 150 - 400 K/uL    Comment: REPEATED TO VERIFY Immature Platelet Fraction may be clinically indicated, consider ordering this additional test [id number] CONSISTENT WITH PREVIOUS RESULT    nRBC 5.3 (H) 0.0 - 0.2 %    Comment: Performed at [facility], [place] [address], [place], [place] [address]  Glucose, capillary     Status: Abnormal   Collection Time: 03/21/19  4:19 AM  Result Value Ref Range   Glucose-Capillary 105 (H) 70 - 99 mg/dL  .Cooxemetry Panel (carboxy, met, total hgb, O2 sat)     Status: Abnormal   Collection Time: 03/21/19  4:32 AM  Result Value Ref Range   Total hemoglobin 11.5 (L) 12.0 - 16.0 g/dL   O2 Saturation 73.3 %   Carboxyhemoglobin 0.6 0.5 - 1.5 %   Methemoglobin 1.2 0.0 - 1.5 %  I-STAT 7, (LYTES, BLD GAS, ICA, H+H)     Status: Abnormal   Collection Time: 03/21/19  7:45 AM  Result Value Ref Range   pH, Arterial 7.257 (L) 7.350 - 7.450   pCO2 arterial 46.6 32.0 - 48.0 mmHg   pO2, Arterial 61.0 (L) 83.0 - 108.0 mmHg   Bicarbonate 20.6 20.0 - 28.0 mmol/L   TCO2 22 22 - 32 mmol/L   O2 Saturation 85.0 %   Acid-base deficit 6.0 (H) 0.0 - 2.0 mmol/L   Sodium 140 135 - 145 mmol/L   Potassium 3.4 (L) 3.5 - 5.1 mmol/L   Calcium, Ion 1.01 (L) 1.15 - 1.40 mmol/L   HCT 37.0 36.0 - 46.0 %    Hemoglobin 12.6 12.0 - 15.0 g/dL   Patient temperature 37.8 C    Sample type ARTERIAL   Glucose, capillary     Status: Abnormal   Collection Time: 03/21/19  7:51 AM  Result Value Ref Range   Glucose-Capillary 100 (H) 70 - 99 mg/dL  APTT     Status: None   Collection Time: 03/21/19  8:41 AM  Result Value Ref Range   aPTT 36 24 - 36 seconds    Comment: Performed at [facility], [address] [place], [place] [address]  I-STAT 7, (LYTES, BLD GAS, ICA, H+H)     Status: Abnormal   Collection Time: 03/21/19 11:24 AM  Result Value Ref Range   pH, Arterial 7.245 (L) 7.350 - 7.450   pCO2 arterial 43.8 32.0 - 48.0 mmHg   pO2, Arterial 61.0 (L) 83.0 - 108.0 mmHg   Bicarbonate 18.9 (L) 20.0 - 28.0 mmol/L   TCO2 20 (L) 22 - 32 mmol/L   O2 Saturation 86.0 %   Acid-base deficit 8.0 (H) 0.0 - 2.0 mmol/L   Sodium 143 135 - 145 mmol/L   Potassium 2.7 (LL) 3.5 - 5.1 mmol/L   Calcium, Ion 0.95 (L) 1.15 - 1.40 mmol/L   HCT 36.0 36.0 - 46.0 %   Hemoglobin 12.2 12.0 - 15.0 g/dL   Patient temperature 37.6 C    Sample type ARTERIAL    Comment NOTIFIED PHYSICIAN   Glucose, capillary     Status: None   Collection Time: 03/21/19 11:30 AM  Result Value Ref Range   Glucose-Capillary 89 70 - 99 mg/dL  APTT     Status: Abnormal   Collection Time: 03/21/19  2:27 PM  Result Value Ref Range   aPTT 56 (H) 24 - 36 seconds    Comment:        IF BASELINE aPTT IS ELEVATED, SUGGEST PATIENT RISK ASSESSMENT BE USED TO DETERMINE APPROPRIATE ANTICOAGULANT THERAPY. Performed at [facility], [place] [address], [place], [place] [address]   Basic metabolic panel     Status: Abnormal   Collection Time: 03/21/19  3:45 PM  Result Value Ref Range   Sodium 140 135 - 145 mmol/L   Potassium 3.2 (L) 3.5 - 5.1 mmol/L   Chloride 102 98 - 111 mmol/L   CO2 20 (L) 22 - 32 mmol/L   Glucose, Bld 102 (H) 70 - 99 mg/dL   BUN 60 (H) 8 - 23 mg/dL   Creatinine, Ser 4.17 (H) 0.44 - 1.00 mg/dL   Calcium 7.1 (L) 8.9  - 10.3 mg/dL   GFR calc non Af Amer 10 (L) >60 mL/min   GFR calc Af Amer 12 (L) >60 mL/min   Anion gap 18 (H) 5 - 15    Comment: Performed at [facility] [address] [place], [place] [address]  Glucose, capillary     Status: None   Collection Time: 03/21/19  4:36 PM  Result Value Ref Range   Glucose-Capillary 99 70 - 99 mg/dL  .Cooxemetry Panel (carboxy, met, total hgb, O2 sat)     Status: Abnormal   Collection Time: 03/21/19  5:00 PM  Result Value Ref Range   Total hemoglobin 11.0 (L) 12.0 - 16.0 g/dL   O2 Saturation 79.1 %   Carboxyhemoglobin 1.4 0.5 - 1.5 %   Methemoglobin 1.0 0.0 - 1.5 %  I-STAT 7, (LYTES, BLD GAS, ICA, H+H)     Status: Abnormal   Collection Time: 03/21/19  5:42 PM  Result Value Ref Range   pH, Arterial 7.270 (L) 7.350 - 7.450   pCO2 arterial 47.3 32.0 - 48.0 mmHg   pO2, Arterial 69.0 (L) 83.0 - 108.0 mmHg   Bicarbonate 21.6 20.0 - 28.0 mmol/L   TCO2 23 22 - 32 mmol/L   O2 Saturation 90.0 %   Acid-base deficit 5.0 (H) 0.0 - 2.0 mmol/L   Sodium 138 135 - 145 mmol/L   Potassium 3.1 (L) 3.5 - 5.1 mmol/L   Calcium, Ion 1.01 (L) 1.15 -  1.40 mmol/L   HCT 37.0 36.0 - 46.0 %   Hemoglobin 12.6 12.0 - 15.0 g/dL   Patient temperature 37.4 C    Sample type ARTERIAL   Heparin level (unfractionated)     Status: Abnormal   Collection Time: 03/21/19  5:54 PM  Result Value Ref Range   Heparin Unfractionated <0.10 (L) 0.30 - 0.70 IU/mL    Comment: (NOTE) If heparin results are below expected values, and patient dosage has  been confirmed, suggest follow up testing of antithrombin III levels. Performed at [facility], [place] [address], [place], [place] [address]   Glucose, capillary     Status: Abnormal   Collection Time: 03/21/19  7:35 PM  Result Value Ref Range   Glucose-Capillary 106 (H) 70 - 99 mg/dL  Triglycerides     Status: None   Collection Time: 03/21/19  8:23 PM  Result Value Ref Range   Triglycerides 127 <150 mg/dL    Comment: Performed  at [facility], [place] [address], [place], [place] [address]  APTT     Status: Abnormal   Collection Time: 03/21/19  8:23 PM  Result Value Ref Range   aPTT 55 (H) 24 - 36 seconds    Comment:        IF BASELINE aPTT IS ELEVATED, SUGGEST PATIENT RISK ASSESSMENT BE USED TO DETERMINE APPROPRIATE ANTICOAGULANT THERAPY. Performed at [facility], [place] [address], [place], [place] [address]   APTT     Status: Abnormal   Collection Time: 03/22/19 12:23 AM  Result Value Ref Range   aPTT 65 (H) 24 - 36 seconds    Comment:        IF BASELINE aPTT IS ELEVATED, SUGGEST PATIENT RISK ASSESSMENT BE USED TO DETERMINE APPROPRIATE ANTICOAGULANT THERAPY. Performed at [facility], [place] [address] [place], [place] [address]   Heparin level (unfractionated)     Status: Abnormal   Collection Time: 03/22/19 12:23 AM  Result Value Ref Range   Heparin Unfractionated 0.11 (L) 0.30 - 0.70 IU/mL    Comment: (NOTE) If heparin results are below expected values, and patient dosage has  been confirmed, suggest follow up testing of antithrombin III levels. Performed at [facility], [place] [address] [place], [place] [address]   Glucose, capillary     Status: Abnormal   Collection Time: 03/22/19 12:23 AM  Result Value Ref Range   Glucose-Capillary 107 (H) 70 - 99 mg/dL  CBC     Status: Abnormal   Collection Time: 03/22/19  4:20 AM  Result Value Ref Range   WBC 13.3 (H) 4.0 - 10.5 K/uL   RBC 3.85 (L) 3.87 - 5.11 MIL/uL   Hemoglobin 11.2 (L) 12.0 - 15.0 g/dL   HCT 31.4 (L) 36.0 - 46.0 %   MCV 81.6 80.0 - 100.0 fL   MCH 29.1 26.0 - 34.0 pg   MCHC 35.7 30.0 - 36.0 g/dL   RDW 16.8 (H) 11.5 - 15.5 %   Platelets 40 (L) 150 - 400 K/uL    Comment: REPEATED TO VERIFY Immature Platelet Fraction may be clinically indicated, consider ordering this additional test [id number] CONSISTENT WITH PREVIOUS RESULT    nRBC 3.0 (H) 0.0 - 0.2 %    Comment: Performed at [facility], [place] [address] [place], [place] [address]  Comprehensive metabolic panel     Status: Abnormal   Collection Time: 03/22/19  4:20 AM  Result Value Ref Range  Sodium 138 135 - 145 mmol/L   Potassium 3.2 (L) 3.5 - 5.1 mmol/L   Chloride 99 98 - 111 mmol/L   CO2 19 (L) 22 - 32 mmol/L   Glucose, Bld 115 (H) 70 - 99 mg/dL   BUN 66 (H) 8 - 23 mg/dL   Creatinine, Ser 4.53 (H) 0.44 - 1.00 mg/dL   Calcium 7.0 (L) 8.9 - 10.3 mg/dL   Total Protein 3.8 (L) 6.5 - 8.1 g/dL   Albumin 1.7 (L) 3.5 - 5.0 g/dL   AST 259 (H) 15 - 41 U/L   ALT 131 (H) 0 - 44 U/L   Alkaline Phosphatase 191 (H) 38 - 126 U/L   Total Bilirubin 2.2 (H) 0.3 - 1.2 mg/dL   GFR calc non Af Amer 9 (L) >60 mL/min   GFR calc Af Amer 10 (L) >60 mL/min   Anion gap 20 (H) 5 - 15    Comment: Performed at [facility], [address] [place], [place] [address]  Heparin level (unfractionated)     Status: Abnormal   Collection Time: 03/22/19  4:20 AM  Result Value Ref Range   Heparin Unfractionated 0.12 (L) 0.30 - 0.70 IU/mL    Comment: (NOTE) If heparin results are below expected values, and patient dosage has  been confirmed, suggest follow up testing of antithrombin III levels. Performed at [facility], [place] [address] [place], [place] [address]   Glucose, capillary     Status: Abnormal   Collection Time: 03/22/19  4:22 AM  Result Value Ref Range   Glucose-Capillary 109 (H) 70 - 99 mg/dL  I-STAT 7, (LYTES, BLD GAS, ICA, H+H)     Status: Abnormal   Collection Time: 03/22/19  4:25 AM  Result Value Ref Range   pH, Arterial 7.260 (L) 7.350 - 7.450   pCO2 arterial 47.6 32.0 - 48.0 mmHg   pO2, Arterial 65.0 (L) 83.0 - 108.0 mmHg   Bicarbonate 21.3 20.0 - 28.0 mmol/L   TCO2 23 22 - 32 mmol/L   O2 Saturation 89.0 %   Acid-base deficit 6.0 (H) 0.0 - 2.0 mmol/L   Sodium 138 135 - 145 mmol/L   Potassium 3.1 (L) 3.5 - 5.1 mmol/L   Calcium, Ion 0.99 (L) 1.15 - 1.40 mmol/L   HCT 37.0 36.0 - 46.0 %   Hemoglobin 12.6 12.0 - 15.0  g/dL   Patient temperature 37.2 C    Collection site ARTERIAL LINE    Drawn by Nurse    Sample type ARTERIAL   .Cooxemetry Panel (carboxy, met, total hgb, O2 sat)     Status: Abnormal   Collection Time: 03/22/19  4:45 AM  Result Value Ref Range   Total hemoglobin 10.2 (L) 12.0 - 16.0 g/dL   O2 Saturation 73.8 %   Carboxyhemoglobin 0.7 0.5 - 1.5 %   Methemoglobin 0.9 0.0 - 1.5 %    Recent Results (from the past 240 hour(s))  SARS Coronavirus 2 (Hosp order,Performed in [facility] lab via [employer] ID)     Status: None   Collection Time: 03/08/2019  6:04 PM   Specimen: Dry Nasal Swab ([employer] ID Now)  Result Value Ref Range Status   SARS Coronavirus 2 ([employer] ID Now) NEGATIVE NEGATIVE Final    Comment: (NOTE) Interpretive Result Comment(s): COVID 19 Positive SARS CoV 2 target nucleic acids are DETECTED. The SARS CoV 2 RNA is generally detectable in upper and lower respiratory specimens during the acute phase of infection.  Positive  results are indicative of active infection with SARS CoV 2.  Clinical correlation with patient history and other diagnostic information is necessary to determine patient infection status.  Positive results do not rule out bacterial infection or coinfection with other viruses. The expected result is Negative. COVID 19 Negative SARS CoV 2 target nucleic acids are NOT DETECTED. The SARS CoV 2 RNA is generally detectable in upper and lower respiratory specimens during the acute phase of infection.  Negative results do not preclude SARS CoV 2 infection, do not rule out coinfections with other pathogens, and should not be used as the sole basis for treatment or other patient management decisions.  Negative results must be combined with clinical  observations, patient history, and epidemiological information. The expected result is Negative. Invalid Presence or absence of SARS CoV 2 nucleic acids cannot be determined. Repeat testing was performed on the  submitted specimen and repeated Invalid results were obtained.  If clinically indicated, additional testing on a new specimen with an alternate test methodology [phone]) is advised.  The SARS CoV 2 RNA is generally detectable in upper and lower respiratory specimens during the acute phase of infection. The expected result is Negative. Fact Sheet for Patients:  GolfingFamily.no Fact Sheet for Healthcare Providers: https://www.hernandez-brewer.com/ This test is not yet approved or cleared by the [place] FDA and has been authorized for detection and/or diagnosis of SARS CoV 2 by FDA under an Emergency Use Authorization (EUA).  This EUA will remain in effect (meaning this test can be used) for the duration of the COVID19 d eclaration under Section 564(b)(1) of the Act, 21 U.S.C. section [phone] 3(b)(1), unless the authorization is terminated or revoked sooner. Performed at [facility], [address] [place], [place] [address]   Culture, respiratory (non-expectorated)     Status: None   Collection Time: 03/17/19 11:37 AM   Specimen: Tracheal Aspirate; Respiratory  Result Value Ref Range Status   Specimen Description TRACHEAL ASPIRATE  Final   Special Requests Normal  Final   Gram Stain   Final    FEW WBC PRESENT, PREDOMINANTLY MONONUCLEAR NO ORGANISMS SEEN    Culture   Final    NO GROWTH 2 DAYS Performed at [facility], [address] [place], [place] [address]    Report Status 03/19/2019 FINAL  Final  Surgical pcr screen     Status: Abnormal   Collection Time: 03/18/19  5:09 AM   Specimen: Nasal Mucosa; Nasal Swab  Result Value Ref Range Status   MRSA, PCR NEGATIVE NEGATIVE Final   Staphylococcus aureus POSITIVE (A) NEGATIVE Final    Comment: (NOTE) The Xpert SA Assay (FDA approved for NASAL specimens in patients 5 years of age and older), is one component of a comprehensive surveillance program. It is not intended  to diagnose infection nor to guide or monitor treatment. Performed at [facility], [place] [address] [place], [place] [address]     Lipid Panel Recent Labs    03/21/19 2023  TRIG 127    Studies/Results: Dg Abd 1 View  Result Date: 03/21/2019 CLINICAL DATA:  Check gastric catheter placement EXAM: ABDOMEN - 1 VIEW COMPARISON:  None. FINDINGS: Gastric catheter has been advanced and now lies in the distal stomach. Postsurgical changes with tubes and lines as previously described. IMPRESSION: Gastric catheter has been advanced into the stomach. Electronically Signed   By: Inez Catalina M.D.   On: 03/21/2019 08:48   Dg Chest [facility] 1 [address]  Result Date: 03/21/2019 CLINICAL DATA:  Dyspnea, follow-up, orogastric tube placement EXAM: PORTABLE CHEST 1 VIEW COMPARISON:  Portable exam 0816 hours compared to 0505 hours FINDINGS: Tip of endotracheal tube projects 4.0 cm above carina. Nasogastric tube extends into stomach. BILATERAL thoracostomy tubes and mediastinal drain. RIGHT jugular Swan-Ganz catheter with tip projecting over main pulmonary artery near bifurcation. Normal heart size mediastinal contours. BILATERAL pulmonary infiltrates. No pleural effusion or pneumothorax. IMPRESSION: BILATERAL pulmonary infiltrates unchanged. Electronically Signed   By: Lavonia Dana M.D.   On: 03/21/2019 08:36   Dg Chest Port 1 View  Result Date: 03/21/2019 CLINICAL DATA:  Status post aortic dissection repair EXAM: PORTABLE CHEST 1 VIEW COMPARISON:  03/20/2019 FINDINGS: Endotracheal tube, gastric catheter and bilateral thoracostomy catheters are again seen and stable. Nasogastric catheter extends to the distal esophagus but not into the stomach. Again this could be advanced several cm. Swan-Ganz catheter and mediastinal drain are noted in satisfactory position. Increased central vascular congestion and interstitial edema is noted. No sizable effusions are seen. Postsurgical changes are again noted. IMPRESSION: Increased  changes of CHF. Tubes and lines as described stable in appearance from the prior exam. The nasogastric catheter could be advanced several cm as necessary. Electronically Signed   By: Inez Catalina M.D.   On: 03/21/2019 08:14    Medications:  Scheduled: . sodium chloride   Intravenous Once  . bisacodyl  10 mg Oral Daily   Or  . bisacodyl  10 mg Rectal Daily  . chlorhexidine gluconate (MEDLINE KIT)  15 mL Mouth Rinse BID  . Chlorhexidine Gluconate Cloth  6 each Topical Q0600  . docusate  200 mg Per Tube Daily  . insulin aspart  0-24 Units Subcutaneous Q4H  . levalbuterol  0.63 mg Nebulization TID  . mouth rinse  15 mL Mouth Rinse 10 times per day  .  morphine injection  2 mg Intravenous Q6H  . mupirocin cream   Topical BID  . pantoprazole (PROTONIX) IV  40 mg Intravenous Q24H  . sodium chloride flush  3 mL Intravenous Q12H  . vancomycin variable dose per unstable renal function (pharmacist dosing)   Does not apply See admin instructions   Continuous: . sodium chloride Stopped (03/18/19 1754)  . sodium chloride    . sodium chloride Stopped (03/22/19 0553)  . amiodarone 60 mg/hr (03/22/19 0600)  . ceFEPime (MAXIPIME) IV Stopped (03/21/19 1805)  . dextrose 20 mL/hr at 03/22/19 0600  . epinephrine 3 mcg/min (03/22/19 0600)  . feeding supplement (VITAL 1.5 CAL) 20 mL/hr at 03/22/19 0600  . furosemide (LASIX) infusion 15 mg/hr (03/22/19 0600)  . heparin 1,150 Units/hr (03/22/19 0600)  . lactated ringers Stopped (03/20/19 0541)  . lactated ringers 10 mL/hr at 03/22/19 0600  . levETIRAcetam Stopped (03/21/19 2158)  . midazolam (VERSED) infusion Stopped (03/21/19 0909)  . milrinone 0.125 mcg/kg/min (03/22/19 0600)  . norepinephrine (LEVOPHED) Adult infusion 15 mcg/min (03/22/19 0600)  . propofol (DIPRIVAN) infusion Stopped (03/21/19 1428)  . valproate sodium 55 mL/hr at 03/22/19 0600   LTM EEG report, 7/2: Beginning time: 7/1//20 at 0730 Ending time:  7/2//20 at 0730 Clinical  interpretation: This day 4 of continuous video EEG monitoring demonstrated resolution of seizures.  Burst suppression pattern was maintained throughout the recording. Continuous monitoring is recommended to ensure stability and resolution of seizures.  Clinical correlation is advised.  Assessment:73 year old female in status epilepticus, initially converted to GPEDs with Keppra, valproic acid and Versed gtt. GPEDs now resolved with achievement of burst  suppression pattern; there are still left anterior frontal sharp waves and spikes, suggestive of cortical irritability in that region. 1.New onset seizureswith status epilepticus. Most likely secondary to either anoxic brain injury, perioperative stroke or a combination. 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 Most likely was due to hypoperfusion versus in situ thrombosis or distal embolization from the aortic arch occurring during the procedure. 2. The GPEDs seenon initial LTM EEG tracings are felt most likely to be due to diffuse injury of the cerebral cortices and/or thalami. 3.EEG tracings today show no seizure recurrence 4. VPA level yesterday is therapeutic at 58  Recommendations: 1.Continue off propofol, which she has been off of for 19 hours. 2. Has been off Versed x 24 hours 3.Continue Keppra and valproic acid scheduled dosing.  4.She isnot able to tolerate MRI currently per Cardiothoracic Surgery team.  5. Continue LTM.  6.Will reassess on Sunday after off all sedation for 72 hours to provide information regarding long term prognosis   LOS: 7 days   35  minutes spent in the Neurological evaluation and management of this critically ill patient. Time spent included bedside EEG review and discussion to update the patient's daughter.   I have seen and examined the patient. I have formulated the assessment and plan.  _0  signed: Dr. Kerney Elbe 03/22/2019  7:59 AM

## 2019-03-22 NOTE — Progress Notes (Signed)
Referral called to [facility].  Referral number:  [id number].  Spoke with Marybelle Killings.

## 2019-03-22 NOTE — Progress Notes (Signed)
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 and 30MM VASCULAR GRAFT (N/A) Subjective: Patient remains unresponsive, sedation off 24 hours No seizure activity on EEG Pupils 3 mm and reactive Chest x-ray with increasing edema Urine output remains at 30 cc/h despite Lasix infusion Sinus rhythm on IV amiodarone with good cardiac output  Objective: Vital signs in last 24 hours: Temp:  [99 F (37.2 C)-99.7 F (37.6 C)] 99 F (37.2 C) (07/03 0700) Pulse Rate:  [99-108] 99 (07/03 0700) Cardiac Rhythm: Sinus tachycardia (07/03 0400) Resp:  [0-29] 22 (07/03 0700) BP: (70-118)/(35-55) 72/51 (07/03 0400) SpO2:  [84 %-96 %] 96 % (07/03 1051) Arterial Line BP: (95-139)/(25-64) 116/40 (07/03 0700) FiO2 (%):  [80 %-100 %] 100 % (07/03 1051) Weight:  [117.2 kg] 117.2 kg (07/03 0400)  Hemodynamic parameters for last 24 hours: PAP: (21-27)/(16-21) 27/18 CVP:  [12 mmHg-18 mmHg] 18 mmHg CO:  [4.9 L/min-6 L/min] 4.9 L/min CI:  [2.5 L/min/m2-3.1 L/min/m2] 2.5 L/min/m2  Intake/Output from previous day: 07/02 0701 - 07/03 0700 In: 3897.6 [I.V.:2981.2; NG/GT:500; IV Piggyback:416.5] Out: 1035 [Urine:775; Chest Tube:260] Intake/Output this shift: No intake/output data recorded.  Unresponsive to stimulus Coarse breath sounds bilaterally Significant generalized edema Palpable pedal pulses Surgical incisions clean and dry  Lab Results: Recent Labs    03/21/19 0417  03/22/19 0420 03/22/19 0425  WBC 10.4  --  13.3*  --   HGB 11.8*   < > 11.2* 12.6  HCT 34.1*   < > 31.4* 37.0  PLT 41*  --  40*  --    < > = values in this interval not displayed.   BMET:  Recent Labs    03/21/19 1545  03/22/19 0420 03/22/19 0425  NA 140   < > 138 138  K 3.2*   < > 3.2* 3.1*  CL 102  --  99  --   CO2 20*  --  19*  --   GLUCOSE 102*  --  115*  --   BUN 60*  --  66*  --   CREATININE 4.17*  --  4.53*  --   CALCIUM 7.1*  --  7.0*  --    < > = values  in this interval not displayed.    PT/INR: No results for input(s): LABPROT, INR in the last 72 hours. ABG    Component Value Date/Time   PHART 7.260 (L) 03/22/2019 0425   HCO3 21.3 03/22/2019 0425   TCO2 23 03/22/2019 0425   ACIDBASEDEF 6.0 (H) 03/22/2019 0425   O2SAT 73.8 03/22/2019 0445   CBG (last 3)  Recent Labs    03/21/19 1935 03/22/19 0023 03/22/19 0422  GLUCAP 106* 107* 109*    Assessment/Plan: S/P Procedure(s) (LRB): REPLACEMENT OF ASCENDING AORTA USING HEMASHIELD PLATINUM 28MM and 30MM VASCULAR GRAFT (N/A) Patient's family understands the prognosis is poor.  Patient's current condition and plan of care discussed with her son at the bedside.  She is DNR with plan to continue current level of support until the heavy sedation has a chance to wear off and chances of neurologic recovery can be determined   LOS: 7 days    Tharon Aquas Trigt III 03/22/2019

## 2019-03-22 NOTE — Progress Notes (Signed)
ANTICOAGULATION CONSULT NOTE - Initial Consult  Pharmacy Consult for heparin Indication: r/o VTE  Allergies  Allergen Reactions  . Ivp Dye [Iodinated Diagnostic Agents]     Rash , hives  . Codeine Itching    Unless something given to counteract itching    Patient Measurements: Height: 5\' 8"  (172.7 cm) Weight: 243 lb 9.7 oz (110.5 kg) IBW/kg (Calculated) : 63.9 Heparin Dosing Weight: 80kg  Vital Signs: Temp: 99.1 F (37.3 C) (07/03 0100) Temp Source: Core (07/03 0000) BP: 75/55 (07/03 0000) Pulse Rate: 101 (07/03 0100)  Labs: Recent Labs    03/19/19 1815  03/20/19 0420  03/20/19 1630  03/21/19 0417 03/21/19 0745  03/21/19 1124 03/21/19 1427 03/21/19 1545 03/21/19 1742 03/21/19 1754 03/21/19 2023 03/22/19 0023  HGB 11.7*   < > 11.9*   < >  --    < > 11.8* 12.6  --  12.2  --   --  12.6  --   --   --   HCT 34.9*   < > 35.1*   < >  --    < > 34.1* 37.0  --  36.0  --   --  37.0  --   --   --   PLT 46*  --  46*  --   --   --  41*  --   --   --   --   --   --   --   --   --   APTT  --   --   --   --   --   --   --   --    < >  --  56*  --   --   --  55* 65*  HEPARINUNFRC  --   --   --   --   --   --   --   --   --   --   --   --   --  <0.10*  --  0.11*  CREATININE 3.91*  --  4.12*  --  4.12*  --  4.02*  --   --   --   --  4.17*  --   --   --   --    < > = values in this interval not displayed.    Estimated Creatinine Clearance: 15.6 mL/min (A) (by C-G formula based on SCr of 4.17 mg/dL (H)).  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   menopause  03/22/2019 1:33 AM

## 2019-03-22 NOTE — Progress Notes (Signed)
[facility] DAILY ICU PROGRESS NOTE                   [address]            [place] [id number]          [phone]   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 and 30MM VASCULAR GRAFT (N/A)  Total Length of Stay:  LOS: 7 days   Subjective: Remains on vent, sedated  Objective: Vital signs in last 24 hours: Temp:  [99 F (37.2 C)-99.9 F (37.7 C)] 99 F (37.2 C) (07/03 0700) Pulse Rate:  [99-108] 99 (07/03 0700) Cardiac Rhythm: Sinus tachycardia (07/03 0400) Resp:  [0-29] 22 (07/03 0700) BP: (70-118)/(35-55) 72/51 (07/03 0400) SpO2:  [78 %-96 %] 93 % (07/03 0759) Arterial Line BP: (95-139)/(25-64) 116/40 (07/03 0700) FiO2 (%):  [80 %-100 %] 100 % (07/03 0759) Weight:  [117.2 kg] 117.2 kg (07/03 0400)  Filed Weights   03/20/19 0500 03/21/19 0500 03/22/19 0400  Weight: 108.4 kg 110.5 kg 117.2 kg    Weight change: 6.7 kg   Hemodynamic parameters for last 24 hours: PAP: (21-27)/(16-21) 27/18 CVP:  [12 mmHg-18 mmHg] 18 mmHg CO:  [4.9 L/min-6 L/min] 4.9 L/min CI:  [2.5 L/min/m2-3.1 L/min/m2] 2.5 L/min/m2  Intake/Output from previous day: 07/02 0701 - 07/03 0700 In: 3897.6 [I.V.:2981.2; NG/GT:500; IV Piggyback:416.5] Out: 1035 [Urine:775; Chest Tube:260]  Intake/Output this shift: No intake/output data recorded.  Current Meds: Scheduled Meds: . sodium chloride   Intravenous Once  . bisacodyl  10 mg Oral Daily   Or  . bisacodyl  10 mg Rectal Daily  . chlorhexidine gluconate (MEDLINE KIT)  15 mL Mouth Rinse BID  . Chlorhexidine Gluconate Cloth  6 each Topical Q0600  . docusate  200 mg Per Tube Daily  . insulin aspart  0-24 Units Subcutaneous Q4H  . levalbuterol  0.63 mg Nebulization TID  . mouth rinse  15 mL Mouth Rinse 10 times per day  .  morphine injection  2 mg Intravenous Q6H  . mupirocin cream   Topical BID  . pantoprazole (PROTONIX) IV  40 mg Intravenous Q24H  . sodium chloride flush  3  mL Intravenous Q12H  . vancomycin variable dose per unstable renal function (pharmacist dosing)   Does not apply See admin instructions   Continuous Infusions: . sodium chloride Stopped (03/18/19 1754)  . sodium chloride    . sodium chloride Stopped (03/22/19 0553)  . amiodarone 60 mg/hr (03/22/19 0600)  . ceFEPime (MAXIPIME) IV Stopped (03/21/19 1805)  . dextrose 20 mL/hr at 03/22/19 0600  . epinephrine 3 mcg/min (03/22/19 0600)  . feeding supplement (VITAL 1.5 CAL) 20 mL/hr at 03/22/19 0600  . furosemide (LASIX) infusion 15 mg/hr (03/22/19 0600)  . heparin 1,150 Units/hr (03/22/19 0600)  . lactated ringers Stopped (03/20/19 0541)  . lactated ringers 10 mL/hr at 03/22/19 0600  . levETIRAcetam Stopped (03/21/19 2158)  . midazolam (VERSED) infusion Stopped (03/21/19 0909)  . milrinone 0.125 mcg/kg/min (03/22/19 0600)  . norepinephrine (LEVOPHED) Adult infusion 15 mcg/min (03/22/19 0600)  . propofol (DIPRIVAN) infusion Stopped (03/21/19 1428)  . valproate sodium 55 mL/hr at 03/22/19 0600   PRN Meds:.sodium chloride, metoprolol tartrate, midazolam, morphine injection, ondansetron (ZOFRAN) IV, oxyCODONE, sodium chloride flush, traMADol  General: She remains sedated on vent Heart: Tachycardic Lungs: Coarse breath sounds bilaterally Abdomen: Soft, sporadic bowel sounds Extremities: Diffuse upper extremity edema. Minor mottling of both tips of toes Wound:  Sternal wound is clean and dry. Right axillary wound with staples intact and wound is clean and dry  Lab Results: CBC: Recent Labs    03/21/19 0417  03/22/19 0420 03/22/19 0425  WBC 10.4  --  13.3*  --   HGB 11.8*   < > 11.2* 12.6  HCT 34.1*   < > 31.4* 37.0  PLT 41*  --  40*  --    < > = values in this interval not displayed.   BMET:  Recent Labs    03/21/19 1545  03/22/19 0420 03/22/19 0425  NA 140   < > 138 138  K 3.2*   < > 3.2* 3.1*  CL 102  --  99  --   CO2 20*  --  19*  --   GLUCOSE 102*  --  115*  --   BUN  60*  --  66*  --   CREATININE 4.17*  --  4.53*  --   CALCIUM 7.1*  --  7.0*  --    < > = values in this interval not displayed.    CMET: Lab Results  Component Value Date   WBC 13.3 (H) 03/22/2019   HGB 12.6 03/22/2019   HCT 37.0 03/22/2019   PLT 40 (L) 03/22/2019   GLUCOSE 115 (H) 03/22/2019   CHOL 189 02/22/2018   TRIG 127 03/21/2019   HDL 64.20 02/22/2018   LDLCALC 101 (H) 02/22/2018   ALT 131 (H) 03/22/2019   AST 259 (H) 03/22/2019   NA 138 03/22/2019   K 3.1 (L) 03/22/2019   CL 99 03/22/2019   CREATININE 4.53 (H) 03/22/2019   BUN 66 (H) 03/22/2019   CO2 19 (L) 03/22/2019   TSH 2.77 02/22/2018   INR 1.4 (H) 03/16/2019   HGBA1C 5.7 02/22/2018    PT/INR:  No results for input(s): LABPROT, INR in the last 72 hours. Radiology: Dg Abd 1 View  Result Date: 03/21/2019 CLINICAL DATA:  Check gastric catheter placement EXAM: ABDOMEN - 1 VIEW COMPARISON:  None. FINDINGS: Gastric catheter has been advanced and now lies in the distal stomach. Postsurgical changes with tubes and lines as previously described. IMPRESSION: Gastric catheter has been advanced into the stomach. Electronically Signed   By: Inez Catalina M.D.   On: 03/21/2019 08:48   Dg Chest Port 1 View  Result Date: 03/21/2019 CLINICAL DATA:  Dyspnea, follow-up, orogastric tube placement EXAM: PORTABLE CHEST 1 VIEW COMPARISON:  Portable exam 0816 hours compared to 0505 hours FINDINGS: Tip of endotracheal tube projects 4.0 cm above carina. Nasogastric tube extends into stomach. BILATERAL thoracostomy tubes and mediastinal drain. RIGHT jugular Swan-Ganz catheter with tip projecting over main pulmonary artery near bifurcation. Normal heart size mediastinal contours. BILATERAL pulmonary infiltrates. No pleural effusion or pneumothorax. IMPRESSION: BILATERAL pulmonary infiltrates unchanged. Electronically Signed   By: Lavonia Dana M.D.   On: 03/21/2019 08:36    Assessment/Plan: S/P Procedure(s) (LRB): REPLACEMENT OF ASCENDING  AORTA USING HEMASHIELD PLATINUM 28MM and 30MM VASCULAR GRAFT (N/A) 1. CV-Previous a fib;converted to SR yesterday. SR in the 90's to ST in the low 100's this am. On Amiodarone, Epinephrine at 3, Milrinone at 0.125, and Nor epinephrine drip at 15. Co ox this am stable at 73.8 (and Milrinone was decreased yesterday). Now on Heparin drip for VTE 2. Pulmonary- Acute hypoxic respiratory failure. She had worsening pulmonary status yesterday. Most lood gas results noted (PH=7.26,PCO2 47.6, PO2 65) . Around 3-4 am, peep up to 10 and rate increased to  22. FIO2 100% this am. Per Dr. Prescott Gum try recruitment. CCM to management vent. Chest tubes with 260 ml output last 24 hours (150 ml last 12 hours). CXR this am appears to show patient very rotated to the right and bibasilar opacities 3. Neurology-Per neurology, continuous EEG . Pre lim EEG this am showed no seizures. CT of head done previously showed watershed distribution and concern for hypoperfusion ischemia involving the right hemisphere. On Keppra and Valproate 4. Acute renal failure-creatinine slightly increased to 4.53  this am. She was unable to tolerate CVVH yesterday. Urine output around 80 cc/ hr. On Lasix drip for volume overload at 15 ml/hr. 5. Thrombocytopenia-platelets slightly decreased to 40,000 and she did receive platelets previously. Not on Heparin or heparin like products but is on ecasa 325..  6. ID-WBC slightly increased to 13,300. On Vancomycin and Cefepime.  Respiratory culture showed no growth 2 days 7. Elevated transaminases-AST 259, ALT 131, and alk phos 191. Likely related to shock 8. Bactroban as Staph Aureus on surgical screen 9. CBGs 106/104/105. -She previously had persistent hypoglycemia yesterday so put on D5 W which has helped 10. She has a very poor prognosis. determined. She was too unstable for transport to CT yesterday. 11. Hypokalemia-potassium 3.2 with creatinine 4.53 12. GI- TFs started slowly yesterday  Nani Skillern PA-C 03/22/2019 8:12 AM

## 2019-03-22 NOTE — Progress Notes (Signed)
Versed 588ml wasted in Stericycle.  Witnessed by Laqueta Jean RN

## 2019-03-22 NOTE — Progress Notes (Signed)
Prelim EEG review - no seizures. Remains very suppressed.  -- Amie Portland, MD [facility] Pager: [phone] If 7pm to 7am, please call on call as listed on AMION.

## 2019-03-22 NOTE — Procedures (Signed)
  Electroencephalogram report- LTM with VIDEO   Beginning time: 7/2//20 at 0730 Ending time:  7/3//20 at 0730 CPT/type : 95720  Day of study: day 4   Technical Description: The EEG was performed using standard setting per the guidelines of American Clinical Neurophysiology Society (ACNS).    A minimum of 21 electrodes were placed on scalp according to the International 10-20 or/and 10-10 Systems. Supplemental electrodes were placed as needed. Single EKG electrode was also used to detect cardiac arrhythmia. Patient's behavior was continuously recorded on video simultaneously with EEG. A minimum of 16 channels were used for data display. Each epoch of study was reviewed manually daily and as needed using standard referential and bipolar montages. Computerized quantitative EEG analysis (such as compressed spectral array analysis, trending, automated spike & seizure detection) were used as indicated.    Spike detection: ON  Seizure detection: ON   This  continuous  EEG monitoring with simultaneous video monitoring was performed for this patient with  NCSE to ensure resolution of seizures.  Day 1 : This recording begins background activities marked by nonreactive attenuated slowing with superimposed continuous to 2.5 cps broadly distributed spike and wave discharges maximum negativity left frontocentral cortex consistent with nonconvulsive status epilepticus.  With medication adjustment as recording continuous background activities evolve into suppressed nonreactive background activities with superimposed predominantly left frontocentral sharp waves and spike and wave discharges was brought negative field at times to given appearance of generalized spike and wave discharges.  At times such broad epileptiform discharges appear in short 3 to 5 seconds along evolving trains concerning for brief electrographic seizures.  Day 2: Burst suppression pattern was achieved during second half of the recording.   At this point ratio 3/7 sec respectively.  Left anterior frontal sharp waves and spikes present within burst of cerebral activities.  No seizures.  Day 3: Burst suppression was archived and maintained throughout the recording. . No seizure.   Day 4: Largely suppressed bgr activities with superimposed busts of attenuated  delta slowing . No seizures.   Clinical interpretation: This day 4 of continuous video EEG monitoring demonstrated mostly suppressed background  activities and  c/w severe encephalopathy, sedation status might contribute to these findings. No seizures were present.  Continuous monitoring is recommended to ensure stability and resolution of seizures.  Clinical correlation is advised.

## 2019-03-22 NOTE — Progress Notes (Signed)
Nutrition Follow-up  DOCUMENTATION CODES:   Not applicable  INTERVENTION:   -Monitor for BM (day 6 without)  Tube feeding:  -Vital 1.5 @ 20 ml/hr via OGT -Increase by 10 ml as tolerated to goal rate of 40 ml/hr (960 ml) -30 ml Prostat TID  At goal provides: 1740 kcals, 110 grams protein, 733 ml free water. Meets 104% kcal needs and 100% of protein needs.   NUTRITION DIAGNOSIS:   Increased nutrient needs related to post-op healing as evidenced by estimated needs.  Ongoing  GOAL:   Patient will meet greater than or equal to 90% of their needs  Addressed via TF- not meeting  MONITOR:   I & O's, Labs, Weight trends, Vent status, Skin  REASON FOR ASSESSMENT:   Ventilator    ASSESSMENT:   73 year old female who presented to the ED on 6/26 with RUQ pain. PMH of LVH, septal hypertrophy, HTN, HLD. US showing cholelithiasis without cholecystitis. 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   6/26 - s/p emergent repair of penetrating ulcer and replacement of the ascending aorta under hypothermic circulatory arrest  Pt made DNR. Unresponsive off sedation. UOP remains minimal with lasix. Chest xray reveals increasing edema. Vital 1.5 started yesterday @ 20 ml/hr via OGT, pt has tolerated this. Cortrak ordered on 7/1. Per RN, plan to hold on placement until further goals of care are discussed (Sunday?) as pt is tolerating feeding via OGT. RD to leave TF recommendations if able to advance.   Admission weight: 79.8 kg Current weight: 117.2 kg  (+35 L since admit, lasix continue)  Generalized moderate +2 pitting edema noted.   Patient is currently intubated on ventilator support MV: 11 L/min Temp (24hrs), Avg:99.3 F (37.4 C), Min:99 F (37.2 C), Max:99.7 F (37.6 C)  Propofol: Off  I/O: +35,869 ml since admit UOP: 775 ml x 24 hrs Chest tubes: 260 ml x 24 hrs    Drips: albumin, amiodarone, D10 @ 20 ml/hr,  250 mg lasix in D5, milrinone, levophed Medications: colace, SS novolog, dulcolax Labs: K 3.1 (L) corrected calcium 8.8 (L) Cr 4.53- trending up LFTs elevated  Diet Order:   Diet Order            Diet NPO time specified  Diet effective now              EDUCATION NEEDS:   No education needs have been identified at this time  Skin:  Skin Assessment: Skin Integrity Issues: Skin Integrity Issues:: Incisions Incisions: closed incisions to chest and right leg  Last BM:  PTA  Height:   Ht Readings from Last 1 Encounters:  03/22/19 5\' 8"  (1.727 m)    Weight:   Wt Readings from Last 1 Encounters:  03/22/19 117.2 kg    Ideal Body Weight:  63.6 kg  BMI:  Body mass index is 39.29 kg/m.  Estimated Nutritional Needs:   Kcal:  1672 kcal  Protein:  110-135 grams  Fluid:  >/= 1.6 L/day  Mariana Single RD, LDN Clinical Nutrition Pager # - [phone]

## 2019-03-22 NOTE — Progress Notes (Signed)
ANTICOAGULATION CONSULT NOTE - Follow Up Consult  Pharmacy Consult for heparin Indication: r/o VTE  Allergies  Allergen Reactions  . Ivp Dye [Iodinated Diagnostic Agents]     Rash , hives  . Codeine Itching    Unless something given to counteract itching    Patient Measurements: Height: 5\' 8"  (172.7 cm) Weight: 258 lb 6.1 oz (117.2 kg) IBW/kg (Calculated) : 63.9 Heparin Dosing Weight: 80kg  Vital Signs: Temp: 99 F (37.2 C) (07/03 0700) Temp Source: Core (07/03 0400) BP: 72/51 (07/03 0400) Pulse Rate: 99 (07/03 0700)  Labs: Recent Labs    03/20/19 0420  03/21/19 0417  03/21/19 1545 03/21/19 1742  03/21/19 2023 03/22/19 0023 03/22/19 0420 03/22/19 0425 03/22/19 0837  HGB 11.9*   < > 11.8*   < >  --  12.6  --   --   --  11.2* 12.6  --   HCT 35.1*   < > 34.1*   < >  --  37.0  --   --   --  31.4* 37.0  --   PLT 46*  --  41*  --   --   --   --   --   --  40*  --   --   APTT  --   --   --    < >  --   --   --  55* 65*  --   --  98*  HEPARINUNFRC  --   --   --   --   --   --    < >  --  0.11* 0.12*  --  0.18*  CREATININE 4.12*   < > 4.02*  --  4.17*  --   --   --   --  4.53*  --   --    < > = values in this interval not displayed.    Estimated Creatinine Clearance: 14.9 mL/min (A) (by C-G formula based on SCr of 4.53 mg/dL (H)).  Assessment: 73 year old female s/p aortic dissection repair 6/26, now with concerns for possible PE.  Started on heparin drip 1100 uts/hr HL 0.18 at goal, aptt 98sec at goal.  pltc low 40s   Goal of Therapy:  Heparin level goal about 0.2 - no higher per Dr Darcey Nora Monitor platelets by anticoagulation protocol: Yes   Plan:  Decrease heparin to1000 units/hr - do not titrate higher per MD Daily HL, aptt  Bonnita Nasuti Pharm.D. CPP, BCPS Clinical Pharmacist [phone] 03/22/2019 11:46 AM

## 2019-03-22 NOTE — Progress Notes (Signed)
      [address]       [place] [id number]             [phone]      Neuro status unchanged  BP (!) 72/51 (BP Location: Right Arm)   Pulse (!) 59   Temp 98.1 F (36.7 C)   Resp (!) 22   Ht 5\' 8"  (1.727 m)   Wt 117.2 kg   SpO2 94%   BMI 39.29 kg/m   Intake/Output Summary (Last 24 hours) at 03/22/2019 1712 Last data filed at 03/22/2019 0600 Gross per 24 hour  Intake 2195.08 ml  Output 620 ml  Net 1575.08 ml   Remo Lipps C. Roxan Hockey, MD [facility] [phone]

## 2019-03-22 NOTE — Progress Notes (Signed)
NAME:  Madeline [name], MRN:  [id number], DOB:  May 29, 1946, LOS: 7 ADMISSION DATE:  02/21/2019, ADMISSION DATE:  03/20/2019  Brief History   [name] w/hxo HTN, presented with chest pain and found to have aortic dissection originating from large ulcer of ascending aorta. S/p emergent repair with cardio thoracic surgery on 6/26. Post-op course c/b multiorgan dysfunction and new seizure activity and EEG consistent with severe encephalopathy. Pt unresponsive on Versed, propofol, Keppra, and valproic acid.  History of present illness   [name] with a  history of HTN, HLD with intermittent epigastric chest pain for the past few weeks. Multiple ED visits with normal cardiac enzymes and Echo showing LVH with normal valves and no pericardial effusions. 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 She underwent emergent surgical replacement of ascending aorta using a Hemashield vascular graft. 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  Post-operative course was initially complicated by multiorgan dysfunction including atrial fibrillation with RVR, acute renal failure and acute respiratory failure, requiring mechanical ventilation and sedation. On 6/28, while on Precedex, patient was noted to have facial twitching concerning for seizure activity that was unresponsive to [place]. EEG was ordered and showed electrographic status epilepticus, which persisted after giving Ativan. Neurology was consulted and are managing patient on Versed and propofol, with scheduled Keppra, and valproic acid.  Family history significant for Aortic dissection in father (deceased). No history of seizures in pt or family.  Past Medical History   Past Medical History:  Diagnosis Date  . Anxiety   . Arthritis 09-14-11   osteoarthritis,  osteopenia  . Cataracts, both eyes 09-13-11   not surgical yet  . Cholelithiasis 05/17/2016  . Chronic neck pain 05/29/2016  . Cystitis 2007   sepsis post bladder biopsy, Dr Jeffie Pollock  . Fibromyalgia 09-14-11   Dr Donney Dice, [facility]  . Fractures 09/14/2011   toes-left foot  . GERD (gastroesophageal reflux disease) 09-14-11   tx. Nexium  . H/O foot surgery 12/19/2011  . Hiatal hernia 05/17/2016  . Hyperglycemia 08/23/2016  . Hyperlipidemia   . Hypertension 09-13-11   tx. meds  . 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 Left ventricular hypertrophy 05/15/2017  . Muscle cramping 10/23/2017  . TMJ click 95/18/8416   right side > left  . Varicose vein 09-14-11   bilateral , with tenderness left shin bone near foot    Significant Hospital Events   6/26 - ED-Hospital Admission 6/26 - Emergency surgical aortic dissection repair 7/01 - Transfer to CVICU  Consults:  Cardiothoracic Surgery Neurology  Procedures:  6/26 - Replacement of ascending aorta w/ Hemashield vascular graft  Significant Diagnostic Tests:  6/13 - ECHO: LVH with hyperdynamic systolic function. EF >65%. Mild aortic regurgitation. No pericardial effusion.  6/26 - CTA Chest/Abd/ Pelv: Penetrating ulver involving anterior ascending aorta with acute intraluminal hematoma. Small volume hemopericardium. Trace L-sided pleural effusion.  6/30 CT Head: Cortical and subcortical hypodensity in R hemisphere, watershed distribution. Periventricular and deep white matter hypodensity. Findings concerning for hypoperfusion ischemia involving R hemisphere.   6/30 LTM EEG: Abnormal and consistent with severe encephalopathy of nonspecific etiologies. Nonconvulsive status epilepticus largely resolved however background activities continue to demonstrate frequent broad spike and wave discharges maximal in the left frontocentral cortex  Micro Data:  6/26 - SARD CoV-2: Negative 6/28 - Respiratory  Culture: NGTD 6/29 - Nasal swab: Staph aureus Positive  Antimicrobials:  Cephtriaxone (6/28- ) 03/20/2019 Maxipime>> 03/20/2019 vancomycin>> Interim history/subjective:  Currently on 100% FiO2.  High levels of PEEP of 15.  Poor prognosis at this time.  Objective   Blood pressure (!) 72/51, pulse 99, temperature 99 F (37.2 C), resp. rate (!) 22, height 5\' 8"  (1.727 m), weight 117.2 kg, SpO2 93 %. PAP: (21-27)/(16-21) 27/18 CVP:  [12 mmHg-18 mmHg] 18 mmHg CO:  [4.9 L/min-6 L/min] 4.9 L/min CI:  [2.5 L/min/m2-3.1 L/min/m2] 2.5 L/min/m2  Vent Mode: PRVC FiO2 (%):  [80 %-100 %] 100 % Set Rate:  [22 bmp] 22 bmp Vt Set:  [510 mL] 510 mL PEEP:  [15 cmH20] 15 cmH20 Plateau Pressure:  [26 cmH20-33 cmH20] 26 cmH20   Intake/Output Summary (Last 24 hours) at 03/22/2019 0858 Last data filed at 03/22/2019 0600 Gross per 24 hour  Intake 3713.53 ml  Output 1025 ml  Net 2688.53 ml   Filed Weights   03/20/19 0500 03/21/19 0500 03/22/19 0400  Weight: 108.4 kg 110.5 kg 117.2 kg    Examination: General: Elderly female is currently not on sedation HEENT: Tracheal tube is in place, disconjugate gaze Neuro: No response to verbal stimuli CV: Heart sounds are distant PULM: even/non-labored, lungs bilaterally diminished GI: Mild distention faint bowel sounds Extremities: warm/dry, 2-3+ edema  Skin: no rashes or lesions   Resolved Hospital Problem list   N/A  Assessment & Plan:  Neuro: Status Epilepticus Per neurology  Acute Encephalopathy: 2/2 status epilepticus and hypoperfusion ischemia Continue to monitor Neurology is following  Cardiovascular: Aortic dissection s/p repair 6/26 Per cardiac thoracic surgery  Shock: Likely combined cardiogenic and distributive Continue epinephrine, norepinephrine, milrinone  Atrial Fibrilation w/ RVR, post-operative Continue amiodarone Goal rate less than 120  Respiratory: Acute Hypoxic Respiratory Failure Ventilator support Monitor checks  x-ray Bronchodilators as needed  Renal: Acute Renal Failure Lab Results  Component Value Date   CREATININE 4.53 (H) 03/22/2019   CREATININE 4.17 (H) 03/21/2019   CREATININE 4.02 (H) 03/21/2019    Renal failure worsening Consider renal consult  Heme: Thrombocytopenia platelets currently 40 Monitor daily Transfuse as needed  Infectious Disease:  Staph aureus colonization Suspected pneumonia Continue Bactroban Started on cefepime and vancomycin 03/20/2019 Follow culture data  GI: Transaminitis likely 2/2 shock and venous congestion, trending upwards. Continue to monitor  Endocrine: Hypoglycemia CBG (last 3)  Recent Labs    03/21/19 1935 03/22/19 0023 03/22/19 0422  GLUCAP 106* 107* 109*    Sliding scale insulin protocol  Best practice:  Diet: Tube feeds Pain/Anxiety/Delirium protocol (if indicated): N/A VAP protocol (if indicated):  Per protocol DVT prophylaxis: SCDs GI prophylaxis: Protonix Glucose control: SSI Mobility: Bedrest (sedated) Code Status: Full Family Communication:  03/22/2019 husband updated at bedside. Disposition: ICU  Labs   CBC: Recent Labs  Lab 03/04/2019 1200  03/19/19 0354  03/19/19 1815  03/20/19 0420  03/21/19 0417 03/21/19 0745 03/21/19 1124 03/21/19 1742 03/22/19 0420 03/22/19 0425  WBC 11.0*   < > 15.4*  --  13.2*  --  10.7*  --  10.4  --   --   --  13.3*  --   NEUTROABS 8.4*  --   --   --   --   --   --   --   --   --   --   --   --   --   HGB 11.8*   < > 12.8   < > 11.7*   < >  11.9*   < > 11.8* 12.6 12.2 12.6 11.2* 12.6  HCT 36.7   < > 36.9   < > 34.9*   < > 35.1*   < > 34.1* 37.0 36.0 37.0 31.4* 37.0  MCV 89.5   < > 83.9  --  87.0  --  86.5  --  85.5  --   --   --  81.6  --   PLT 414*   < > 17*  --  46*  --  46*  --  41*  --   --   --  40*  --    < > = values in this interval not displayed.    Basic Metabolic Panel: Recent Labs  Lab 03/16/19 1105  03/16/19 1718  03/17/19 0200  03/17/19 1852  03/20/19 0420   03/20/19 1630  03/21/19 0417  03/21/19 1124 03/21/19 1545 03/21/19 1742 03/22/19 0420 03/22/19 0425  NA 147*   < > 151*   < > 145   < > 146*   < > 142   < > 141   < > 140   < > 143 140 138 138 138  K 4.4   < > 3.5   < > 3.9   < > 3.9   < > 4.1   < > 3.6   < > 3.4*   < > 2.7* 3.2* 3.1* 3.2* 3.1*  CL 114*  --  113*   < > 113*  --  109   < > 105  --  102  --  103  --   --  102  --  99  --   CO2 20*  --  23  --  23  --  23   < > 22  --  21*  --  20*  --   --  20*  --  19*  --   GLUCOSE 192*   < > 136*   < > 123*   < > 211*   < > 107*  --  118*  --  119*  --   --  102*  --  115*  --   BUN 10  --  13   < > 18  --  26*   < > 50*  --  54*  --  55*  --   --  60*  --  66*  --   CREATININE 0.95  --  1.43*   < > 1.88*  --  2.58*   < > 4.12*  --  4.12*  --  4.02*  --   --  4.17*  --  4.53*  --   CALCIUM 10.5*  --  10.3  --  9.6  --  8.8*   < > 7.5*  --  7.3*  --  7.1*  --   --  7.1*  --  7.0*  --   MG 1.8  --  3.4*  --  2.7*  --  2.6*  --   --   --   --   --   --   --   --   --   --   --   --    < > = values in this interval not displayed.   GFR: Estimated Creatinine Clearance: 14.9 mL/min (A) (by C-G formula based on SCr of 4.53 mg/dL (H)). Recent Labs  Lab 03/19/19 1815 03/20/19 0420 03/20/19 1731 03/21/19 0417 03/22/19 0420  PROCALCITON  --   --  2.90  --   --   WBC 13.2* 10.7*  --  10.4 13.3*    Liver Function Tests: Recent Labs  Lab 03/18/19 0419 03/19/19 0354 03/20/19 0420 03/21/19 0417 03/22/19 0420  AST 104* 183* 191* 222* 259*  ALT 51* 92* 101* 105* 131*  ALKPHOS 40 51 69 93 191*  BILITOT 3.6* 2.0* 1.3* 1.9* 2.2*  PROT 3.6* 3.7* 3.9* 3.8* 3.8*  ALBUMIN 2.5* 2.4* 2.2* 1.9* 1.7*   Recent Labs  Lab 03/01/2019 1200  LIPASE 24   No results for input(s): AMMONIA in the last 168 hours.  ABG    Component Value Date/Time   PHART 7.260 (L) 03/22/2019 0425   PCO2ART 47.6 03/22/2019 0425   PO2ART 65.0 (L) 03/22/2019 0425   HCO3 21.3 03/22/2019 0425   TCO2 23 03/22/2019 0425    ACIDBASEDEF 6.0 (H) 03/22/2019 0425   O2SAT 73.8 03/22/2019 0445     Coagulation Profile: Recent Labs  Lab 03/16/19 1321  INR 1.4*    Cardiac Enzymes: No results for input(s): CKTOTAL, CKMB, CKMBINDEX, TROPONINI in the last 168 hours.  HbA1C: Hgb A1c MFr Bld  Date/Time Value Ref Range Status  02/22/2018 10:06 AM 5.7 4.6 - 6.5 % Final    Comment:    Glycemic Control Guidelines for People with Diabetes:Non Diabetic:  <6%Goal of Therapy: <7%Additional Action Suggested:  >8%   10/23/2017 10:58 AM 5.9 4.6 - 6.5 % Final    Comment:    Glycemic Control Guidelines for People with Diabetes:Non Diabetic:  <6%Goal of Therapy: <7%Additional Action Suggested:  >8%     CBG: Recent Labs  Lab 03/21/19 1130 03/21/19 1636 03/21/19 1935 03/22/19 0023 03/22/19 0422  GLUCAP 89 99 106* 107* 109*    Critical care time: 94minutes    Richardson Landry Tiffancy Moger ACNP [facility] PCCM Pager [phone] till 1 pm If no answer page [phone] 03/22/2019, 8:58 AM

## 2019-03-22 NOTE — Progress Notes (Signed)
LTM maintenance completed; reprepped and reglued C3, F3, F7,  and Pz. No skin breakdown was seen.

## 2019-03-23 LAB — COMPREHENSIVE METABOLIC PANEL WITH GFR
ALT: 118 U/L — ABNORMAL HIGH (ref 0–44)
AST: 209 U/L — ABNORMAL HIGH (ref 15–41)
Albumin: 1.6 g/dL — ABNORMAL LOW (ref 3.5–5.0)
Alkaline Phosphatase: 212 U/L — ABNORMAL HIGH (ref 38–126)
Anion gap: 19 — ABNORMAL HIGH (ref 5–15)
BUN: 76 mg/dL — ABNORMAL HIGH (ref 8–23)
CO2: 18 mmol/L — ABNORMAL LOW (ref 22–32)
Calcium: 7.1 mg/dL — ABNORMAL LOW (ref 8.9–10.3)
Chloride: 100 mmol/L (ref 98–111)
Creatinine, Ser: 4.74 mg/dL — ABNORMAL HIGH (ref 0.44–1.00)
GFR calc Af Amer: 10 mL/min — ABNORMAL LOW
GFR calc non Af Amer: 9 mL/min — ABNORMAL LOW
Glucose, Bld: 123 mg/dL — ABNORMAL HIGH (ref 70–99)
Potassium: 3.7 mmol/L (ref 3.5–5.1)
Sodium: 137 mmol/L (ref 135–145)
Total Bilirubin: 2.1 mg/dL — ABNORMAL HIGH (ref 0.3–1.2)
Total Protein: 3.7 g/dL — ABNORMAL LOW (ref 6.5–8.1)

## 2019-03-23 LAB — APTT: aPTT: 76 seconds — ABNORMAL HIGH (ref 24–36)

## 2019-03-23 LAB — POCT I-STAT 7, (LYTES, BLD GAS, ICA,H+H)
Acid-base deficit: 8 mmol/L — ABNORMAL HIGH (ref 0.0–2.0)
Acid-base deficit: 7 mmol/L — ABNORMAL HIGH (ref 0.0–2.0)
Bicarbonate: 19.2 mmol/L — ABNORMAL LOW (ref 20.0–28.0)
Bicarbonate: 19.9 mmol/L — ABNORMAL LOW (ref 20.0–28.0)
Calcium, Ion: 1.02 mmol/L — ABNORMAL LOW (ref 1.15–1.40)
Calcium, Ion: 1.01 mmol/L — ABNORMAL LOW (ref 1.15–1.40)
HCT: 30 % — ABNORMAL LOW (ref 36.0–46.0)
HCT: 31 % — ABNORMAL LOW (ref 36.0–46.0)
Hemoglobin: 10.2 g/dL — ABNORMAL LOW (ref 12.0–15.0)
Hemoglobin: 10.5 g/dL — ABNORMAL LOW (ref 12.0–15.0)
O2 Saturation: 83 %
O2 Saturation: 94 %
Patient temperature: 37.8
Patient temperature: 38.3
Potassium: 3.2 mmol/L — ABNORMAL LOW (ref 3.5–5.1)
Potassium: 3.5 mmol/L (ref 3.5–5.1)
Sodium: 137 mmol/L (ref 135–145)
Sodium: 135 mmol/L (ref 135–145)
TCO2: 21 mmol/L — ABNORMAL LOW (ref 22–32)
TCO2: 21 mmol/L — ABNORMAL LOW (ref 22–32)
pCO2 arterial: 49.8 mmHg — ABNORMAL HIGH (ref 32.0–48.0)
pCO2 arterial: 47.5 mmHg (ref 32.0–48.0)
pH, Arterial: 7.198 — CL (ref 7.350–7.450)
pH, Arterial: 7.238 — ABNORMAL LOW (ref 7.350–7.450)
pO2, Arterial: 62 mmHg — ABNORMAL LOW (ref 83.0–108.0)
pO2, Arterial: 88 mmHg (ref 83.0–108.0)

## 2019-03-23 LAB — CBC
HCT: 24.7 % — ABNORMAL LOW (ref 36.0–46.0)
Hemoglobin: 9.2 g/dL — ABNORMAL LOW (ref 12.0–15.0)
MCH: 29.2 pg (ref 26.0–34.0)
MCHC: 37.2 g/dL — ABNORMAL HIGH (ref 30.0–36.0)
MCV: 78.4 fL — ABNORMAL LOW (ref 80.0–100.0)
Platelets: 40 10*3/uL — ABNORMAL LOW (ref 150–400)
RBC: 3.15 MIL/uL — ABNORMAL LOW (ref 3.87–5.11)
RDW: 16.4 % — ABNORMAL HIGH (ref 11.5–15.5)
WBC: 10.8 10*3/uL — ABNORMAL HIGH (ref 4.0–10.5)
nRBC: 4.2 % — ABNORMAL HIGH (ref 0.0–0.2)

## 2019-03-23 LAB — COOXEMETRY PANEL
Carboxyhemoglobin: 1.2 % (ref 0.5–1.5)
Methemoglobin: 1.4 % (ref 0.0–1.5)
O2 Saturation: 65.3 %
Total hemoglobin: 9.4 g/dL — ABNORMAL LOW (ref 12.0–16.0)

## 2019-03-23 LAB — HEPARIN LEVEL (UNFRACTIONATED): Heparin Unfractionated: 0.11 [IU]/mL — ABNORMAL LOW (ref 0.30–0.70)

## 2019-03-23 LAB — GLUCOSE, CAPILLARY
Glucose-Capillary: 121 mg/dL — ABNORMAL HIGH (ref 70–99)
Glucose-Capillary: 93 mg/dL (ref 70–99)

## 2019-04-02 ENCOUNTER — Other Ambulatory Visit (HOSPITAL_BASED_OUTPATIENT_CLINIC_OR_DEPARTMENT_OTHER): Payer: Medicare Other

## 2019-04-02 ENCOUNTER — Ambulatory Visit (HOSPITAL_BASED_OUTPATIENT_CLINIC_OR_DEPARTMENT_OTHER): Payer: Medicare Other

## 2019-04-17 ENCOUNTER — Other Ambulatory Visit (HOSPITAL_COMMUNITY): Payer: Medicare Other

## 2019-04-20 NOTE — Progress Notes (Signed)
Asystole noted on bedside ekg.  No heart sounds ausculted by 2 RNs:  Genia Harold RN, Dorene Grebe RN.  Family at bedside with pt.

## 2019-04-20 NOTE — Procedures (Signed)
  Electroencephalogram report- LTM with VIDEO   Beginning time: 7/3//20 at 0730 Ending time:  7/4//20 at 0730 CPT/type : 95720  Day of study: day 5   Technical Description: The EEG was performed using standard setting per the guidelines of American Clinical Neurophysiology Society (ACNS).    A minimum of 21 electrodes were placed on scalp according to the International 10-20 or/and 10-10 Systems. Supplemental electrodes were placed as needed. Single EKG electrode was also used to detect cardiac arrhythmia. Patient's behavior was continuously recorded on video simultaneously with EEG. A minimum of 16 channels were used for data display. Each epoch of study was reviewed manually daily and as needed using standard referential and bipolar montages. Computerized quantitative EEG analysis (such as compressed spectral array analysis, trending, automated spike & seizure detection) were used as indicated.    Spike detection: ON  Seizure detection: ON   This  continuous  EEG monitoring with simultaneous video monitoring was performed for this patient with  NCSE to ensure resolution of seizures.  Day 1 : This recording begins background activities marked by nonreactive attenuated slowing with superimposed continuous to 2.5 cps broadly distributed spike and wave discharges maximum negativity left frontocentral cortex consistent with nonconvulsive status epilepticus.  With medication adjustment as recording continuous background activities evolve into suppressed nonreactive background activities with superimposed predominantly left frontocentral sharp waves and spike and wave discharges was brought negative field at times to given appearance of generalized spike and wave discharges.  At times such broad epileptiform discharges appear in short 3 to 5 seconds along evolving trains concerning for brief electrographic seizures.  Day 2: Burst suppression pattern was achieved during second half of the recording.   At this point ratio 3/7 sec respectively.  Left anterior frontal sharp waves and spikes present within burst of cerebral activities.  No seizures.  Day 3: Burst suppression was archived and maintained throughout the recording. . No seizure.   Day 4: Largely suppressed bgr activities with superimposed busts of attenuated  delta slowing . No seizures.  Day 5: Mostly suppressed nonreactive background activity throughout the recording.  No clinical subclinical seizures.  EKG notable for bradyarrhythmia and eventual asystole around 7 AM.  Clinical interpretation: This day 5 of continuous video EEG monitoring demonstrated mostly suppressed background  activities consistent with severe encephalopathy.  EKG demonstrated asystole at the end of the recording accompanied by suppressed nonreactive EEG.  Clinical correlation is advised.

## 2019-04-20 NOTE — Progress Notes (Signed)
RR increased to 30 per MD order.

## 2019-04-20 NOTE — Progress Notes (Addendum)
Updated Dr. Roxan Hockey on pt status and prior interventions. Per MD, can increase levophed ceiling to 50 mcg/min; no other interventions available at this time.  Sherlie Ban, RN

## 2019-04-20 NOTE — Progress Notes (Signed)
Pt noted to be breathing over the ventilator at this time. Will continue to monitor.  Sherlie Ban, RN

## 2019-04-20 NOTE — Discharge Summary (Signed)
NAME: Madeline [name], Madeline [name] MEDICAL RECORD EX:[id number] ACCOUNT [id number] DATE OF BIRTH:06-Feb-1946 FACILITY: [facility] LOCATION: [facility] PHYSICIAN:Kharma Sampsel VAN TRIGT III, MD  DISCHARGE SUMMARY  DATE OF DISCHARGE:  04/05/2019  DISCHARGE-DEATH SUMMARY  ADMISSION DIAGNOSES: 1.  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 2.  Hypertension. 3.  Unexplained 25-pound weight loss in the previous 12 weeks. 4.  Status post bilateral total knee replacements for osteoarthritis.  OPERATIONS AND PROCEDURES: 1.  Emergency repair of type A ascending aortic dissection with replacement of the ascending aorta and hemiarch reconstruction using a 28 mm Hemashield graft and a 30 mm Hemashield Dacron graft. 2.  Hypothermic circulatory arrest with antegrade cerebral perfusion monitored by cerebral oximetry. 3.  Postoperative EEG on 07/02 by Dr. Deborah Chalk of neurology.  HOSPITAL COURSE:  The patient is a 73 year old female with hypertension and positive family history of aortic dissection who had 2-3 weeks of preceding chest pain for which she had been assessed on several occasions and found to have normal cardiac  enzymes.  A stress test had been performed which was reported as negative.  Because of increased severity of pain and weakness, she was evaluated at an urgent care center where a chest CT scan was performed which diagnosed a type A aortic dissection.   She was transferred to [facility] for emergency surgery.  Prior to surgery, I reviewed the CT scans and discussed the procedure of aortic dissection repair with the patient in the preoperative area and also with her husband in the family waiting area.   I discussed with both the indications for emergency surgery, the expected poor outcome without surgery, and the potential risks of surgery, including the risk of stroke, bleeding, blood  transfusion requirement, postoperative organ failure,  postoperative pulmonary problems, including pleural effusion, postoperative arrhythmias, and death.  After reviewing these issues, the patient demonstrated her understanding and agreed to proceed with surgery under what I felt was informed consent.  The patient was placed under anesthesia and she remained stable.  A transesophageal echo probe was placed which showed left ventricular hypertrophy with fairly well preserved LV systolic function, a moderate pericardial effusion, mild aortic  insufficiency.  The patient was cannulated using axillary artery for inflow and femoral venous 2-stage cannula for outflow to the cardiopulmonary bypass circuit.  The sternotomy was performed without incident.  The pericardium was opened, and there was a  moderate serosanguineous pericardial effusion with hematoma of the ascending aorta.  Over the next several hours, the patient was cooled to 20 degrees centigrade.  Under hypothermic circulatory arrest with antegrade cerebral perfusion through the axillary artery, the ascending aorta was resected and replaced with a 28 mm Hemashield  graft, and the aortic arch was reconstructed with a hemiarch using a 30 mm graft to remove the large penetrating ulcer of the lesser curvature of the aortic arch.  After an extensive period of rewarming the patient was weaned off cardiopulmonary bypass with adequate biventricular function but had severe coagulopathy with prolonged ACT despite reversal of heparin with protamine and severe thrombocytopenia.  She required several blood product transfusions  as well as a dose of factor VII clotting factor.  The patient's hemostasis gradually improved with extensive effort over a period of time and finally  was adequate to allow closure of the chest, which she tolerated and returned to the ICU.  The patient initially was hemodynamically  stable with acceptable chest tube output.  The urine  output was adequate.  Chest x-ray was clear with adequate oxygenation.  Over the next 24 hours, the patient, however, did not show evidence of responsiveness  despite tapering down the sedation protocol.  Over the next 24 hours, the patient was noted to have tremor or tic-like activity of her mouth and jaw.  Neurology consultation and an EEG was performed which showed evidence of a seizure disorder.  At that  time, the patient's hemodynamics and cardiac output were normal, acid base balance was normal, and further support and observation were recommended, as well as medications for seizure activity directed by neurology.  The next day, the patient's pulmonary  status was stable for transport for  a head CT scan which showed evidence of a probable right cerebral stroke with some hypolucency areas noted.  The patient then started having evidence of progressive organ failure with increased oxygen demands and PEEP and FIO2  managed by critical care and oliguria with rising creatinine up to 4.2.  At this point, there was great concern over the patient's neurologic status, and all sedation was turned off to see how she would respond.  The patient's husband and family were  closely involved with her course, and her condition was reviewed with the patient's family at least on a daily basis.  After the neurologic status appeared to be abnormal, a DNR was requested by the family, but supportive care was continued.  However,  over the next 36 hours, there was no evidence of responsiveness, her  hemodynamics and oxygenation continued to progressively  deteriorate, and the patient expired on the morning of postop day 7 with her family in attendance.  FINAL DIAGNOSES: 1.  Acute type A aortic dissection from a penetrating ulcer of the aortic arch with intramural hematoma of the aortic arch and branches. 2.  Postoperative coagulopathy with severe thrombocytopenia and altered coagulation parameters. 3.  Postoperative  stroke with seizure activity. 4.  Postoperative acute oliguric renal failure. 5.  Postoperative acute respiratory failure--acute respiratory distress syndrome.  LN/NUANCE D:03/26/2019 T:03/26/2019 JOB:[id number]

## 2019-04-20 NOTE — Progress Notes (Signed)
Called and updated pt family; they will be coming to the hospital shortly.  Sherlie Ban, RN

## 2019-04-20 NOTE — Progress Notes (Signed)
[place] donor services called referral number [phone] suitable for tissue, over the age for eyes.

## 2019-04-20 NOTE — Progress Notes (Signed)
   2019-04-07 0800  Clinical Encounter Type  Visited With Family;Health care provider  Visit Type Follow-up;Spiritual support;Death  Referral From Nurse  Spiritual Encounters  Spiritual Needs Prayer;Emotional;Grief support  Stress Factors  Family Stress Factors Loss;Major life changes   Met w/ family (daughter Apolonio Schneiders, son Berton Mount, and husband Eduard Clos) at bedside soon after pt death.  Empathetic listening.  Prayed w/ family for pt to be received into God's loving care.  Apolonio Schneiders lamented not being able to have "normal" funeral d/t Covid-19.  Spoke w/ RN before and after mtg w/ family.  Myra Gianotti resident, [phone]

## 2019-04-20 NOTE — Progress Notes (Signed)
LTM stopped, pt expired. Nurse removed EEG leads.

## 2019-04-20 NOTE — Progress Notes (Signed)
Pt vitals labile, maxed on pressors; checked ABG - results called to [facility]. Vent adjustment orders placed, notified RT. Will continue to monitor.  Sherlie Ban, RN

## 2019-04-20 DEATH — deceased

## 2019-07-23 ENCOUNTER — Encounter: Payer: Medicare Other | Admitting: Family Medicine

## 2020-07-31 IMAGING — CR CHEST  1 VIEW
1 series · 1 of 1 positions shown · non-contrast
Comparison: March 12, 2019 chest radiograph and March 15, 2019 chest
CT

CLINICAL DATA: Status post median sternotomy.  Hypoxia.

EXAM:
CHEST  1 VIEW

[AP]
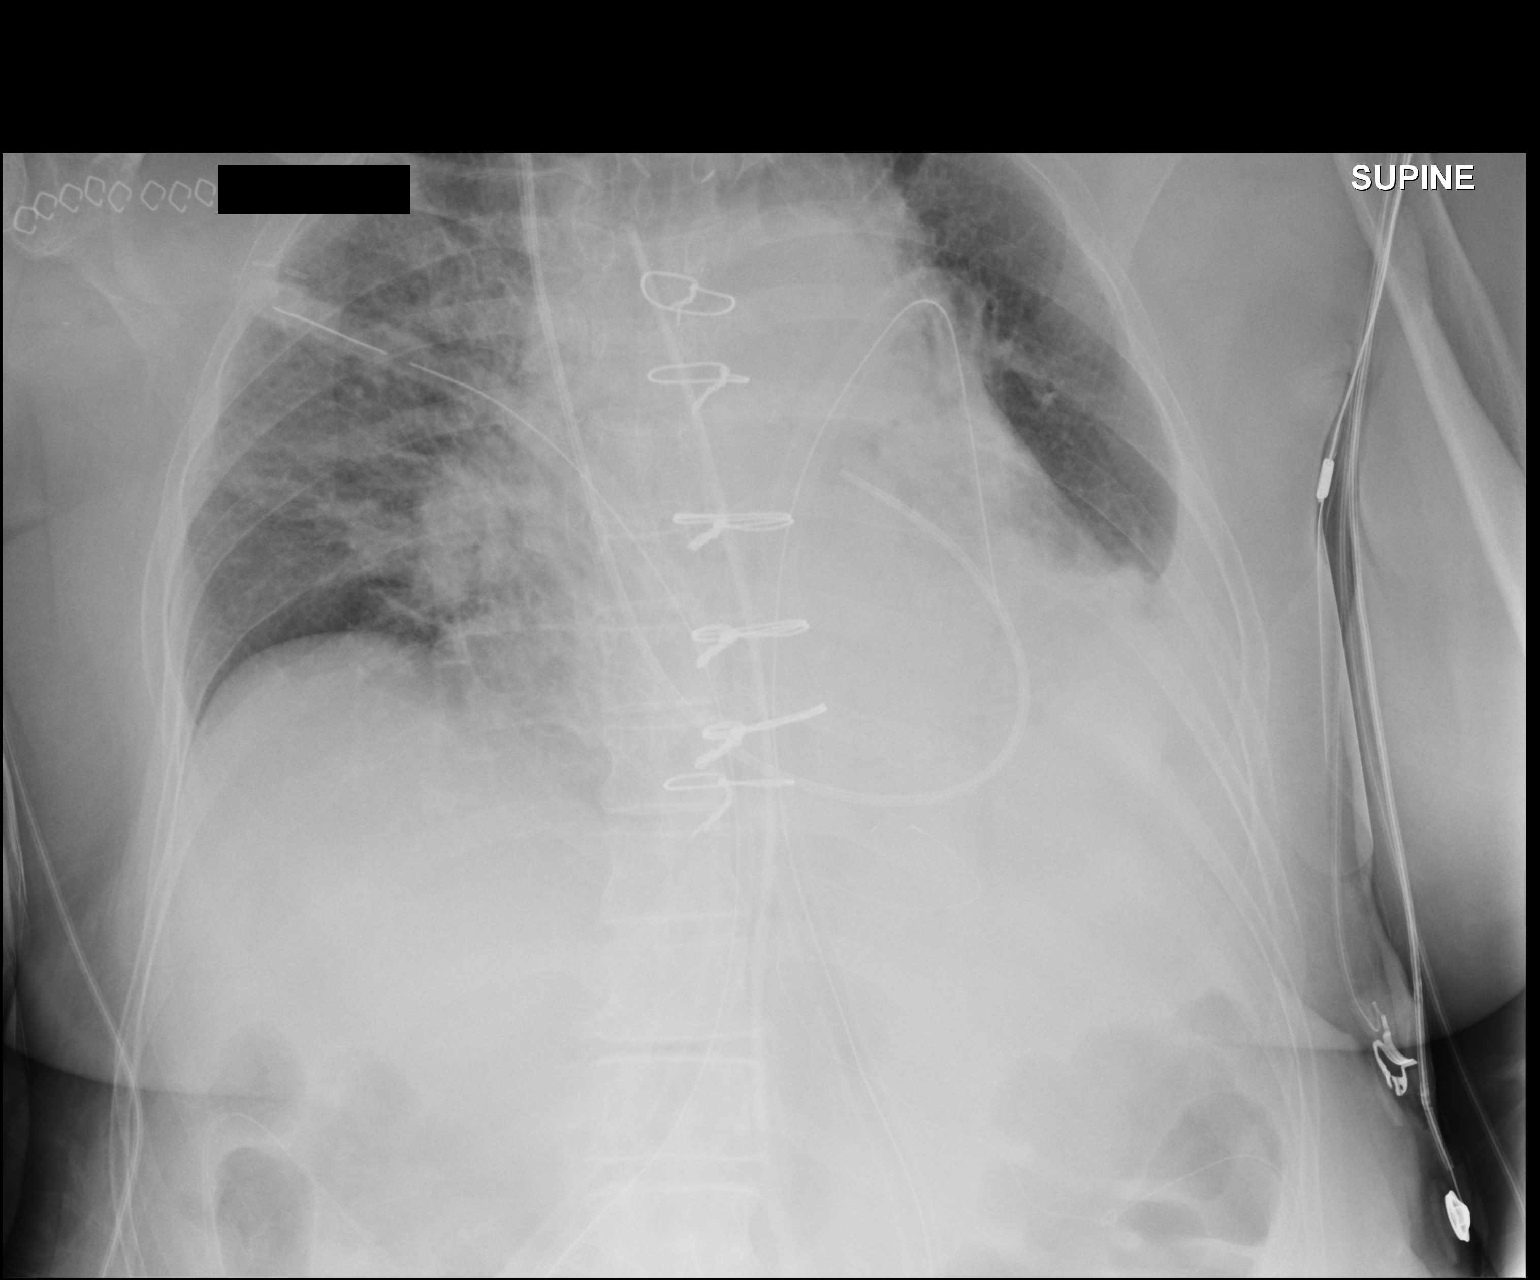

[1 of 1 positions shown; findings below may reference images not displayed]

FINDINGS: Endotracheal tube tip is 6.2 cm above the carina. Swan-Ganz catheter
tip is in the main pulmonary outflow tract. There is a mediastinal
drain as well as bilateral chest tubes. No evident pneumothorax.

There is a left pleural effusion with left base atelectasis. There
is also atelectatic change in the right mid and lower lung zones.
There is trace interstitial edema. There is borderline cardiac
enlargement. The aorta is somewhat ill-defined which is likely due
to edema secondary to the recent surgery. No bone lesions are
appreciable. There are skin staples overlying the right axillary
region.
IMPRESSION: Tube and catheter positions as described without evident
pneumothorax. Left pleural effusion with left base atelectasis. Mild
edema with patchy atelectasis on the right. No frank consolidation.
Aortic contour less than optimally visualized, likely due to edema
from recent surgery. Heart mildly prominent.

## 2020-07-31 IMAGING — DX PORTABLE CHEST - 1 VIEW
1 series · 1 of 1 positions shown · non-contrast
Comparison: Portable exam 4734 hours compared to 3332 hours

CLINICAL DATA: Pneumothorax

EXAM:
PORTABLE CHEST 1 VIEW

[chest ap]
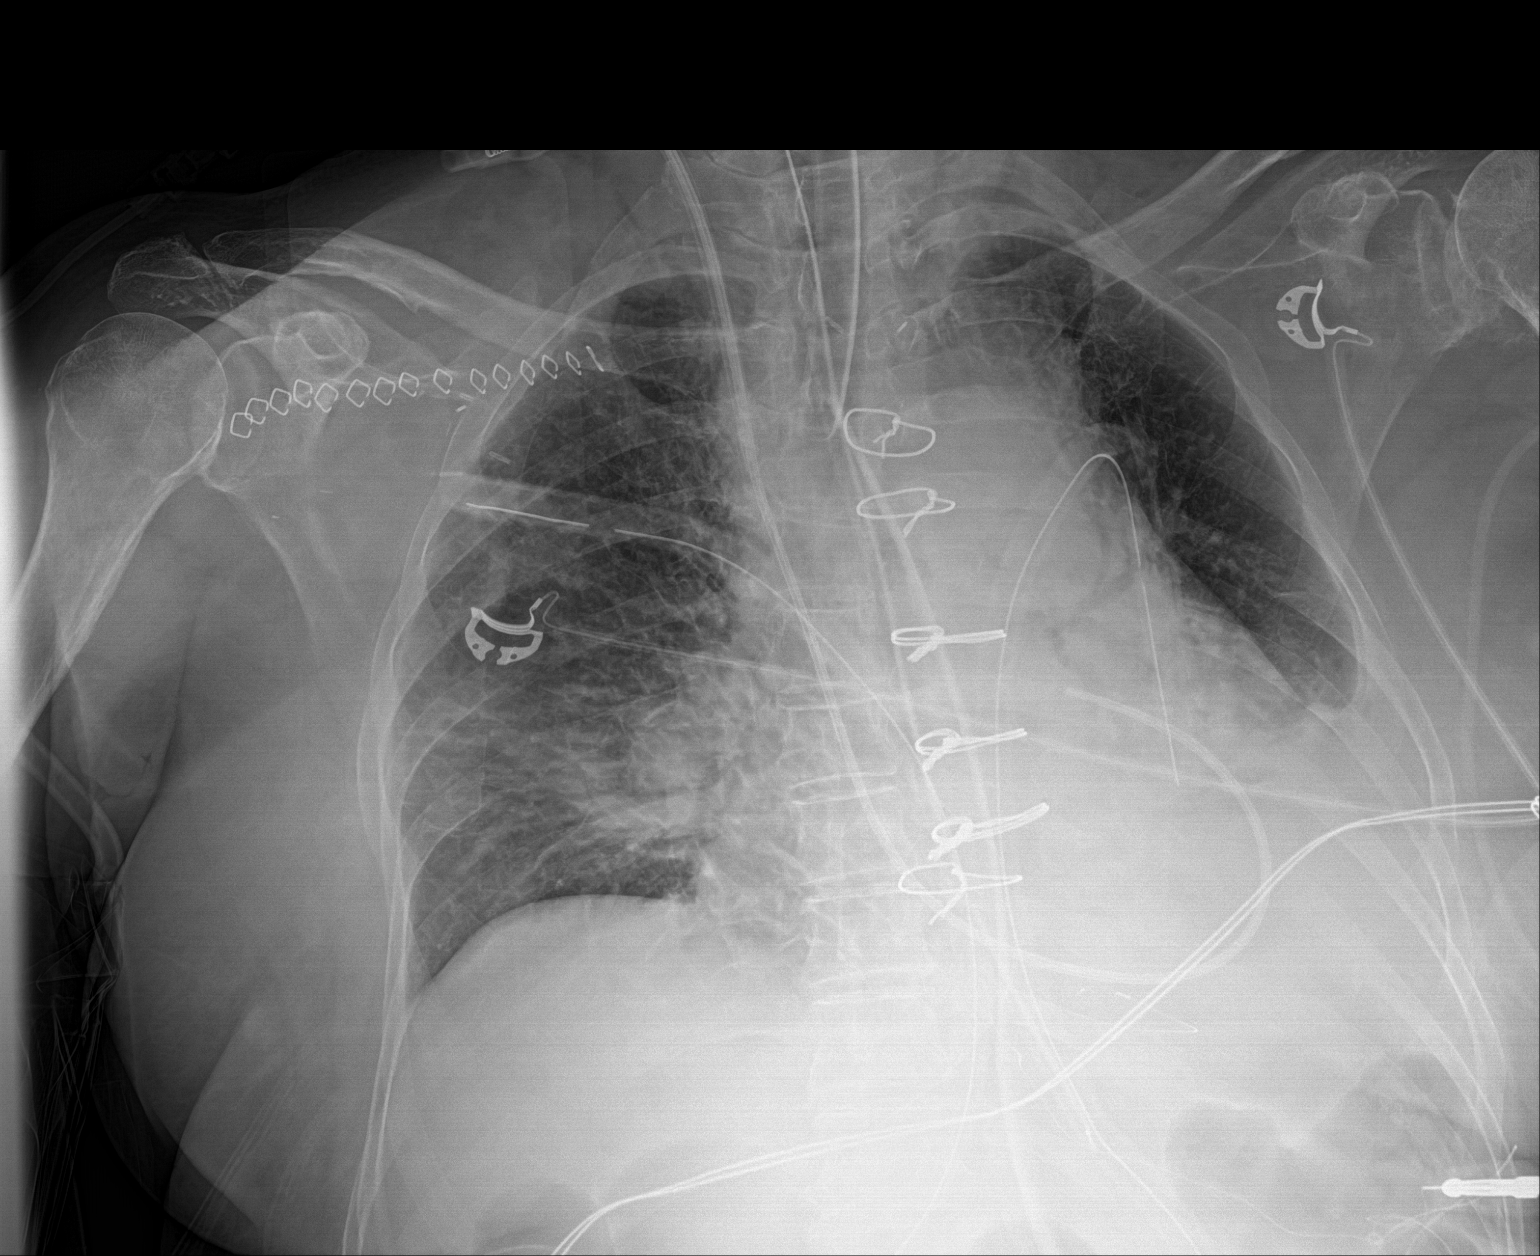

[1 of 1 positions shown; findings below may reference images not displayed]

FINDINGS: Tip of endotracheal tube projects 1.7 cm above carina.

Nasogastric tube extends into stomach.

RIGHT jugular Swan-Ganz catheter with tip over proximal RIGHT
pulmonary artery.

Mediastinal drain and BILATERAL thoracostomy tubes present.

Epicardial pacing wires present.

Enlargement of cardiac silhouette with vascular congestion.

Improved perihilar edema.

Persistent LEFT pleural effusion and bibasilar atelectasis.

No pneumothorax.
IMPRESSION: Improved pulmonary edema with persistent LEFT pleural effusion and
bibasilar atelectasis.

Line and tube positions as above.

## 2020-08-01 IMAGING — DX PORTABLE CHEST - 1 VIEW
2 series · 2 of 2 positions shown · non-contrast
Comparison: Chest radiograph dated 03/16/2019

CLINICAL DATA: 73-year-old female with hypoxia.

EXAM:
PORTABLE CHEST 1 VIEW

[chest ap (1 of 2)]
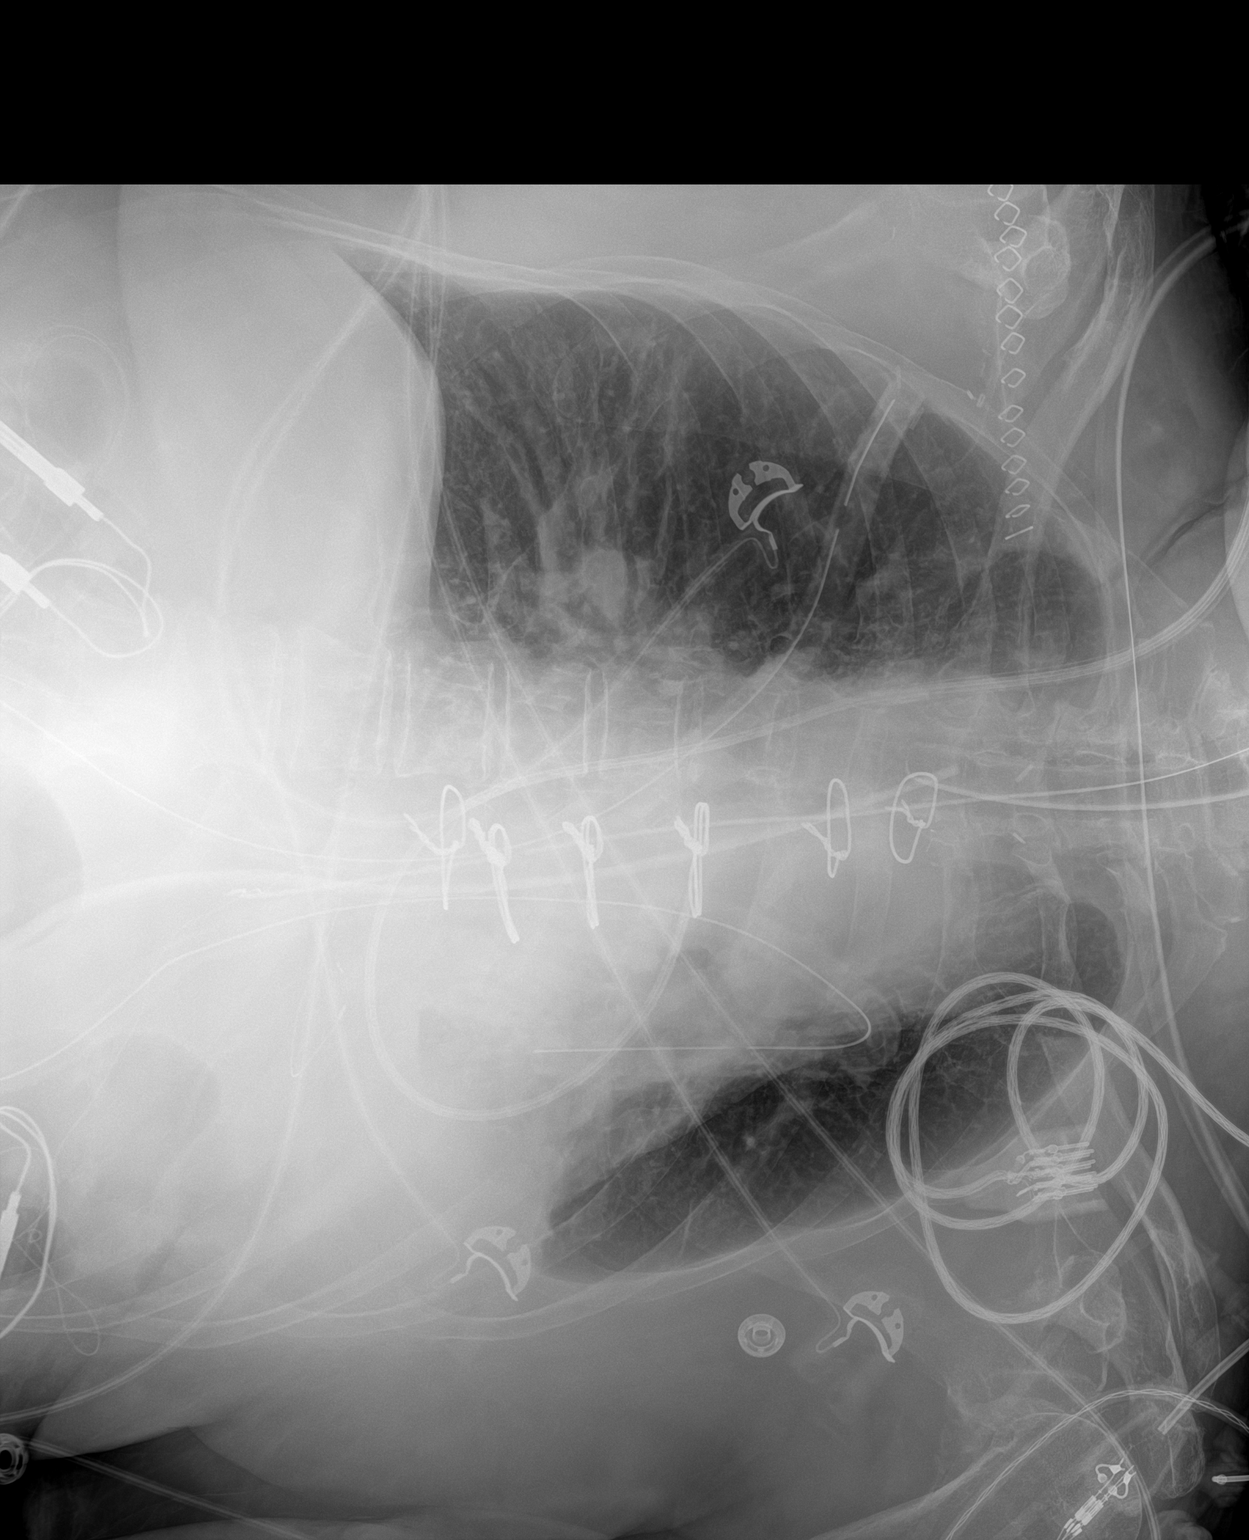

[chest ap (2 of 2)]
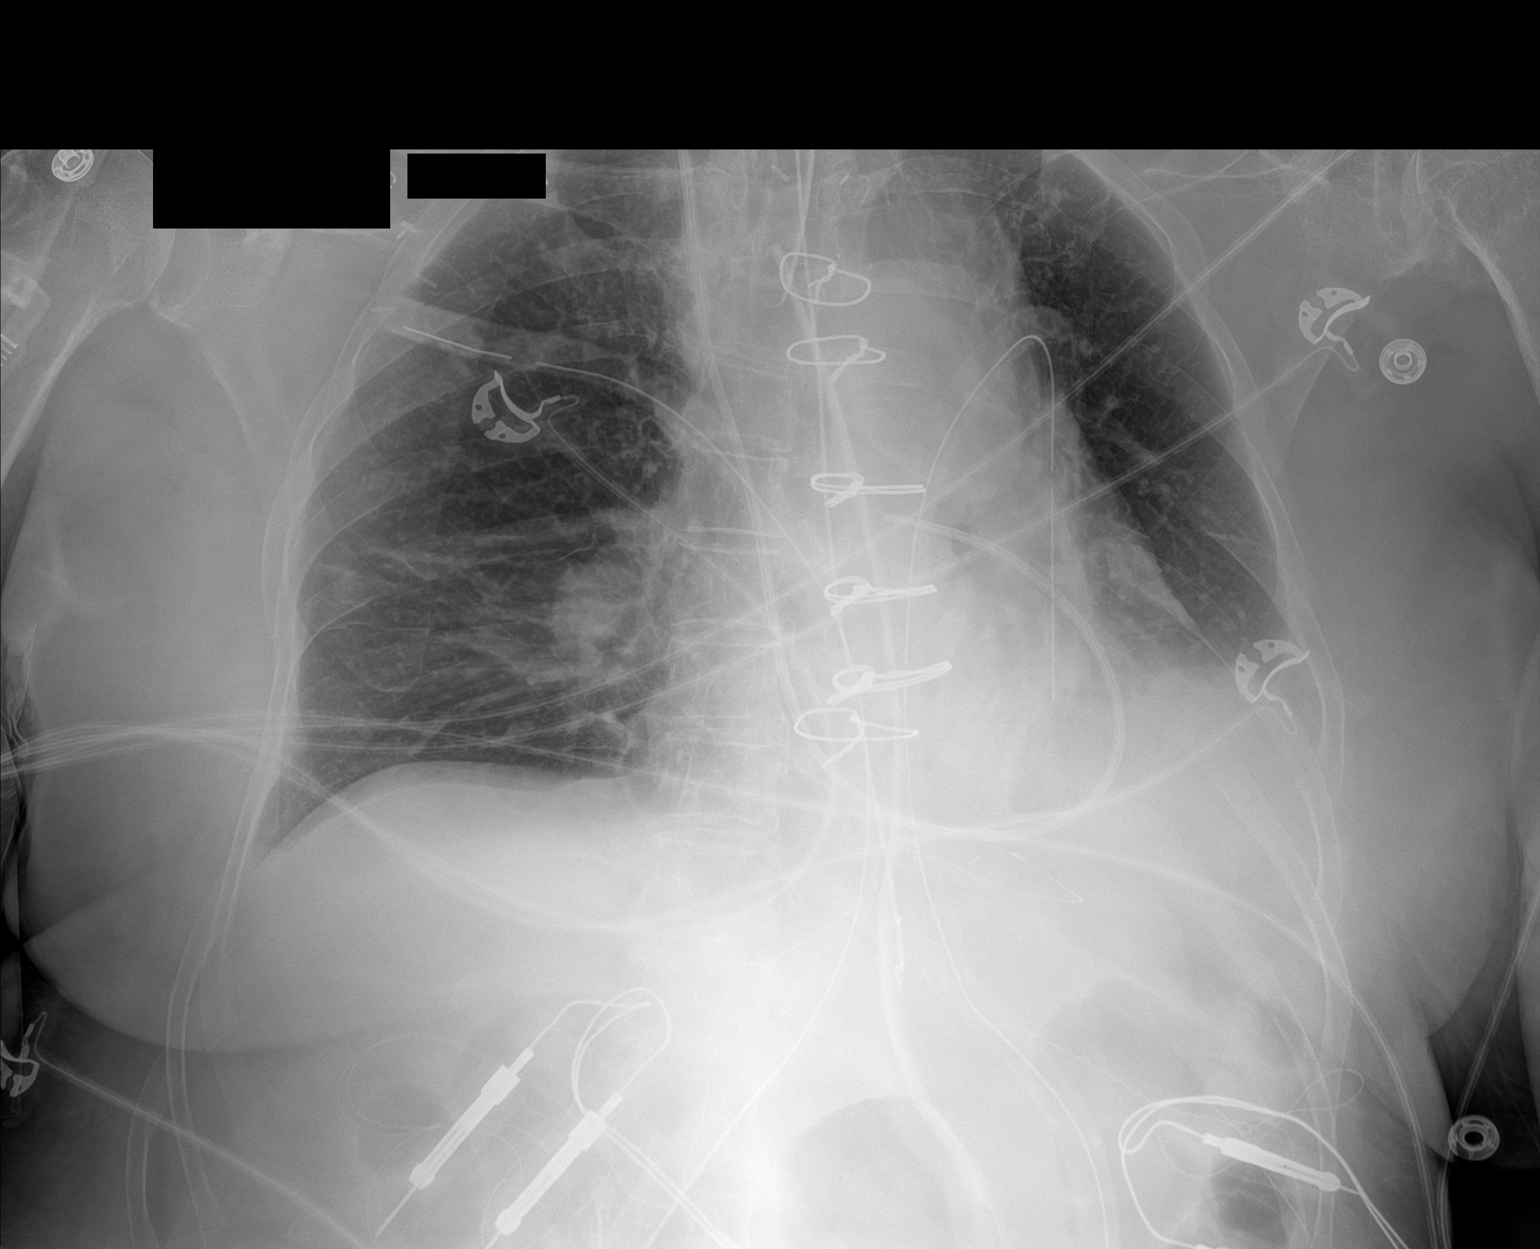

[2 of 2 positions shown; findings below may reference images not displayed]

FINDINGS: Similar positioning of support lines and tubes.

Slight interval improvement of pulmonary edema since the earlier
radiograph. Relatively similar appearance of left lung base and
right perihilar densities.
IMPRESSION: Slight interval improvement of pulmonary edema since the earlier
radiograph.

## 2020-08-04 IMAGING — DX PORTABLE CHEST - 1 VIEW
1 series · 1 of 1 positions shown · non-contrast
Comparison: 03/19/2019

CLINICAL DATA: Status post aortic dissection repair

EXAM:
PORTABLE CHEST 1 VIEW

[chest ap]
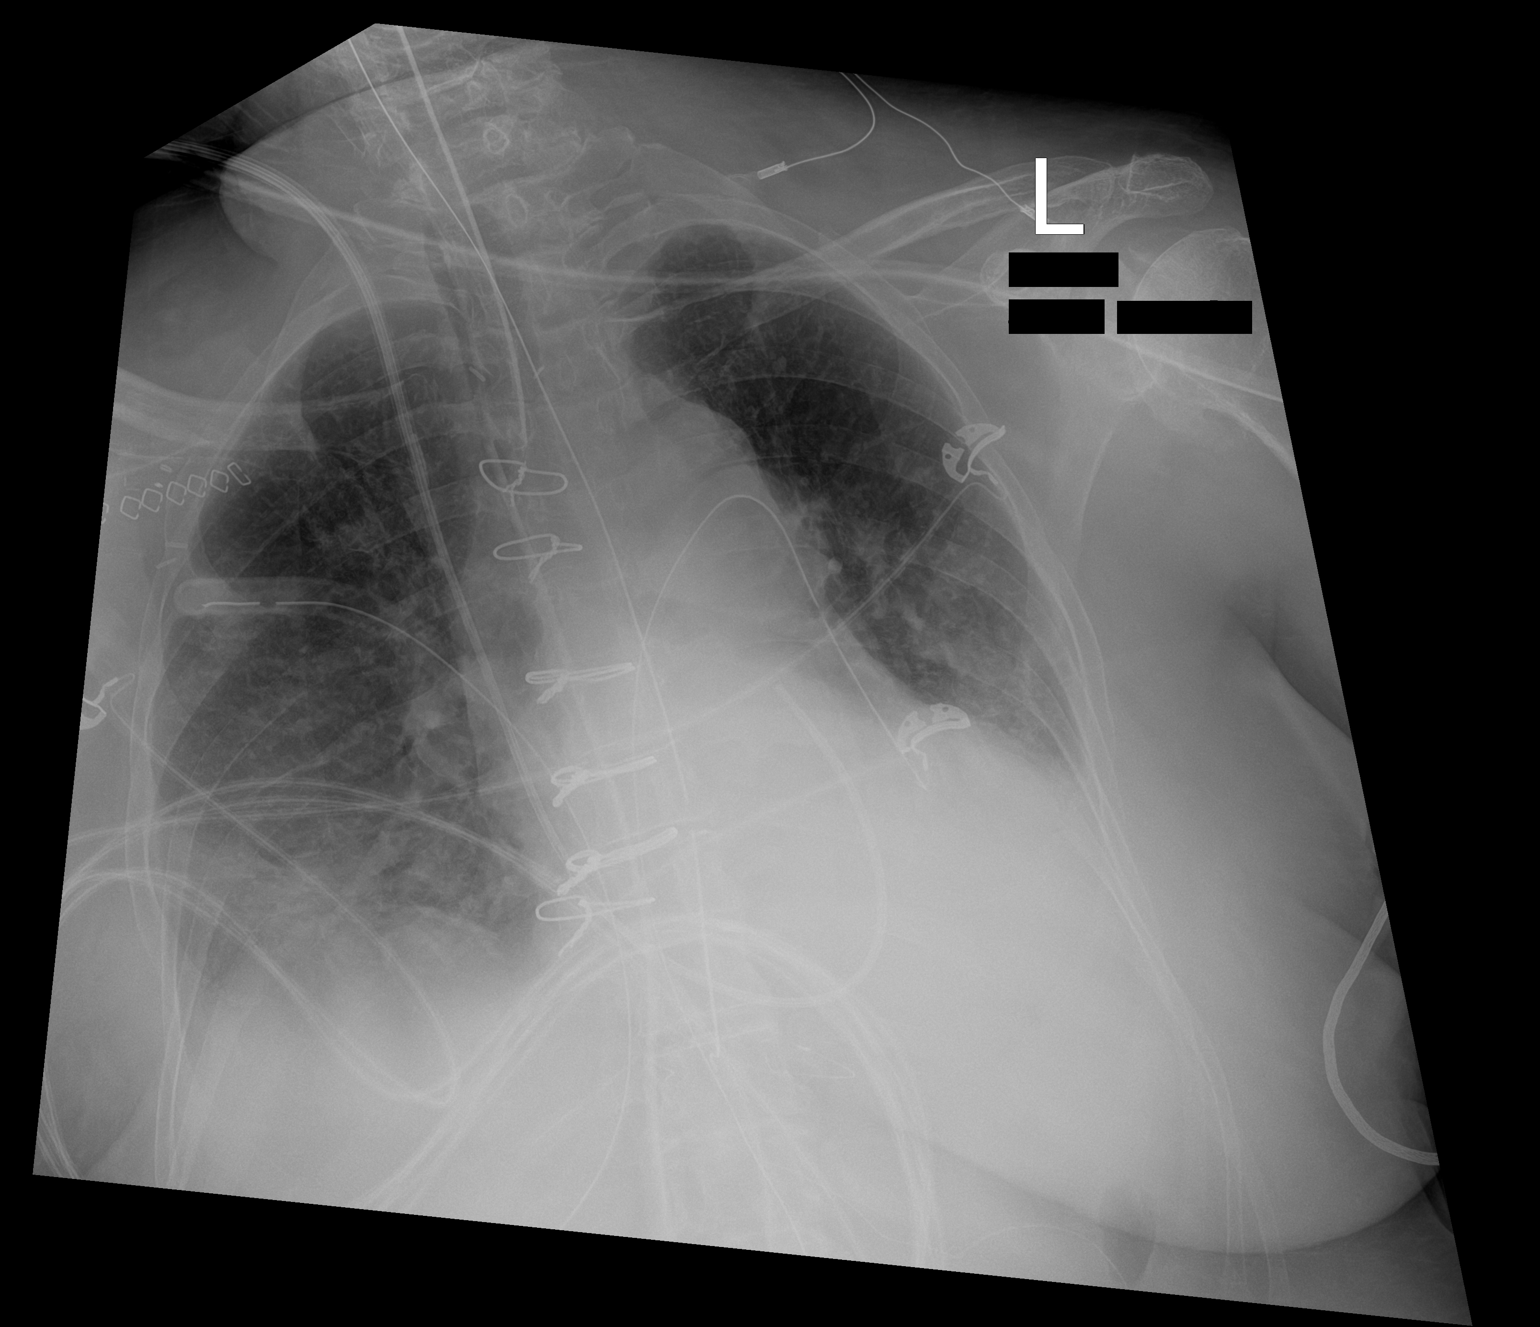

[1 of 1 positions shown; findings below may reference images not displayed]

FINDINGS: No change in AP portable examination status post median sternotomy,
with bilateral chest and mediastinal drainage tubes, endotracheal
tube, esophagogastric tube, and right neck pulmonary arterial
catheter. Small layering bilateral pleural effusions and edema. No
significant pneumothorax. Esophagogastric tube remains positioned
with tip near the gastroesophageal junction and side port over the
lower esophagus.
IMPRESSION: 1. No change in AP portable examination status post median
sternotomy, with bilateral chest and mediastinal drainage tubes,
endotracheal tube, esophagogastric tube, and right neck pulmonary
arterial catheter. Small layering bilateral pleural effusions and
edema. No significant pneumothorax.

2. Esophagogastric tube remains positioned with tip near the
gastroesophageal junction and side port over the lower esophagus.
Consider advancement to ensure subdiaphragmatic position.
